# Patient Record
Sex: Female | Born: 1975
Health system: Southern US, Academic
[De-identification: ages and names within clinical notes are randomized; demographics above are authoritative.]

## PROBLEM LIST (undated history)

## (undated) ENCOUNTER — Emergency Department (HOSPITAL_COMMUNITY): Payer: Self-pay | Source: Home / Self Care

## (undated) ENCOUNTER — Telehealth

## (undated) ENCOUNTER — Encounter

## (undated) ENCOUNTER — Telehealth: Attending: Surgical Critical Care

## (undated) ENCOUNTER — Telehealth: Attending: Student in an Organized Health Care Education/Training Program

## (undated) ENCOUNTER — Encounter: Attending: Surgical Critical Care

## (undated) ENCOUNTER — Telehealth: Attending: "Nutrition

## (undated) ENCOUNTER — Encounter: Attending: "Nutrition

## (undated) ENCOUNTER — Telehealth: Attending: Orthopaedic Surgery

## (undated) ENCOUNTER — Inpatient Hospital Stay

## (undated) ENCOUNTER — Encounter: Attending: Anesthesiology

## (undated) ENCOUNTER — Encounter: Attending: Orthopaedic Surgery

## (undated) ENCOUNTER — Inpatient Hospital Stay: Payer: MEDICARE

## (undated) ENCOUNTER — Inpatient Hospital Stay: Payer: Medicare Other

## (undated) DIAGNOSIS — K5730 Diverticulosis of large intestine without perforation or abscess without bleeding: Secondary | ICD-10-CM

## (undated) DIAGNOSIS — Z9889 Other specified postprocedural states: Secondary | ICD-10-CM

## (undated) DIAGNOSIS — E119 Type 2 diabetes mellitus without complications: Secondary | ICD-10-CM

## (undated) DIAGNOSIS — E7800 Pure hypercholesterolemia, unspecified: Secondary | ICD-10-CM

## (undated) DIAGNOSIS — E039 Hypothyroidism, unspecified: Secondary | ICD-10-CM

## (undated) DIAGNOSIS — E109 Type 1 diabetes mellitus without complications: Secondary | ICD-10-CM

## (undated) DIAGNOSIS — F419 Anxiety disorder, unspecified: Secondary | ICD-10-CM

## (undated) DIAGNOSIS — Z931 Gastrostomy status: Secondary | ICD-10-CM

## (undated) DIAGNOSIS — E063 Autoimmune thyroiditis: Secondary | ICD-10-CM

## (undated) DIAGNOSIS — K219 Gastro-esophageal reflux disease without esophagitis: Secondary | ICD-10-CM

## (undated) DIAGNOSIS — R112 Nausea with vomiting, unspecified: Secondary | ICD-10-CM

## (undated) DIAGNOSIS — M1990 Unspecified osteoarthritis, unspecified site: Secondary | ICD-10-CM

## (undated) DIAGNOSIS — F909 Attention-deficit hyperactivity disorder, unspecified type: Secondary | ICD-10-CM

## (undated) HISTORY — PX: FEMORAL-POPLITEAL BYPASS GRAFT: SHX937

## (undated) HISTORY — PX: ABDOMINAL HYSTERECTOMY: SHX81

## (undated) HISTORY — PX: CHOLECYSTECTOMY: SHX55

## (undated) HISTORY — DX: Autoimmune thyroiditis: E06.3

## (undated) HISTORY — PX: KNEE SURGERY: SHX244

## (undated) HISTORY — PX: OTHER SURGICAL HISTORY: SHX169

## (undated) HISTORY — DX: Gastrostomy status: Z93.1

## (undated) HISTORY — DX: Diverticulosis of large intestine without perforation or abscess without bleeding: K57.30

## (undated) HISTORY — DX: Type 1 diabetes mellitus without complications: E10.9

## (undated) HISTORY — DX: Type 2 diabetes mellitus without complications: E11.9

## (undated) HISTORY — DX: Attention-deficit hyperactivity disorder, unspecified type: F90.9

---

## 2003-05-09 DIAGNOSIS — Z720 Tobacco use: Secondary | ICD-10-CM | POA: Insufficient documentation

## 2004-04-07 DIAGNOSIS — F329 Major depressive disorder, single episode, unspecified: Secondary | ICD-10-CM | POA: Insufficient documentation

## 2004-04-07 DIAGNOSIS — F32A Depression, unspecified: Secondary | ICD-10-CM | POA: Insufficient documentation

## 2011-07-02 DIAGNOSIS — R079 Chest pain, unspecified: Secondary | ICD-10-CM

## 2011-07-28 DIAGNOSIS — E785 Hyperlipidemia, unspecified: Secondary | ICD-10-CM | POA: Insufficient documentation

## 2012-10-16 DIAGNOSIS — Z789 Other specified health status: Secondary | ICD-10-CM | POA: Insufficient documentation

## 2012-10-16 DIAGNOSIS — E559 Vitamin D deficiency, unspecified: Secondary | ICD-10-CM | POA: Insufficient documentation

## 2012-11-01 DIAGNOSIS — E109 Type 1 diabetes mellitus without complications: Secondary | ICD-10-CM | POA: Insufficient documentation

## 2013-11-08 ENCOUNTER — Ambulatory Visit: Payer: Self-pay | Admitting: Internal Medicine

## 2013-11-15 ENCOUNTER — Ambulatory Visit (INDEPENDENT_AMBULATORY_CARE_PROVIDER_SITE_OTHER): Payer: BC Managed Care – PPO | Admitting: Internal Medicine

## 2013-11-15 ENCOUNTER — Encounter: Payer: Self-pay | Admitting: Internal Medicine

## 2013-11-15 VITALS — BP 114/68 | HR 95 | Temp 98.5°F | Resp 12 | Ht 66.0 in | Wt 155.8 lb

## 2013-11-15 DIAGNOSIS — Z90410 Acquired total absence of pancreas: Secondary | ICD-10-CM

## 2013-11-15 DIAGNOSIS — E063 Autoimmune thyroiditis: Secondary | ICD-10-CM

## 2013-11-15 DIAGNOSIS — E139 Other specified diabetes mellitus without complications: Secondary | ICD-10-CM

## 2013-11-15 DIAGNOSIS — E891 Postprocedural hypoinsulinemia: Secondary | ICD-10-CM

## 2013-11-15 NOTE — Progress Notes (Signed)
Patient ID: Jessica [name], female   DOB: 12/18/1975, 38 y.o.   MRN: [id number]   HPI  Jessica [name] is a 38 y.o.-year-old female, referred by her PCP, Dr.Favero ([place] Legrand Pitts), for management of euthyroid Hashimoto's thyroiditis and also upcoming pancreatectomy-related diabetes mellitus.   Pt. has been dx with Hashimoto's thyroiditis in 08/2013) when she presented with hypothyroid sxs.   I reviewed pt's thyroid tests per records received from PCP (08/2013): TSH 1.780 Free T4 1.2 (0.82-1.77)  Total T3 130 (71-180) TPO 44 (<34)  Pt denies feeling nodules in neck, hoarseness, dysphagia/odynophagia, SOB with lying down.  Pt describes: - no cold intolerance; +++ hot flushes - lost 60 lbs since 10 mo ago 2/2 chronic pancreatitis  - + fatigue - + constipation (long before starting Oxycodone) - + dry skin - + hair falling (worse in last 6 mo) - no depression  She has + FH of thyroid disorders in: hyperthyroidism and also hypothyroidism in sister, mother and aunt. No FH of thyroid cancer.  No h/o radiation tx to head or neck. No recent use of iodine supplements.  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 >> believed to be 2/2 genetic causes. She would like to follow with me for her post-pancreatectomy DM.  ROS: Constitutional: see HPI Eyes: no blurry vision, no xerophthalmia ENT: no sore throat, no nodules palpated in throat, occasional dysphagia/no odynophagia, no hoarseness Cardiovascular: no CP/SOB/palpitations/leg swelling Respiratory: no cough/SOB Gastrointestinal: + all: N/V/D/C Musculoskeletal: + both: muscle/joint aches Skin: no rashes, + hair loss Neurological: no tremors/numbness/tingling/dizziness, + HA Psychiatric: no depression/anxiety  PMH: - chronic pancreatitis - Hypertriglyceridemia - Hashimoto's thyroiditis  PSxH: - Knee Sx  - cholecystectomy 2000 -  Hysterectomy 2014  No past surgical history on file. History   Social History  . Marital Status: Married    Spouse Name: N/A    Number of Children: 0   Occupational History  . Pharm tech   Social History Main Topics  . Smoking status: Former Games developer  . Smokeless tobacco: Not on file  . Alcohol Use: 1 PPD  . Drug Use: No   Current Outpatient Rx  Name  Route  Sig  Dispense  Refill  . estradiol (VIVELLE-DOT) 0.1 MG/24HR patch   Transdermal   Place 1 patch onto the skin 2 (two) times a week.         . fenofibrate (TRICOR) 145 MG tablet   Oral   Take 145 mg by mouth at bedtime.         . Multiple Vitamins-Minerals (HM MULTIVITAMIN ADULT GUMMY PO)   Oral   Take 2 each by mouth daily.         . Oxycodone HCl 10 MG TABS   Oral   Take 10 mg by mouth 3 (three) times daily.          Allergies: PCN EES Sulfa Vancomycin Doxycycline Levaquin Statins Zetia Morphine, shellfish  FH: - see HPi  PE: BP 114/68  Pulse 95  Temp(Src) 98.5 F (36.9 C) (Oral)  Resp 12  Ht 5\' 6"  (1.676 m)  Wt 155 lb 12.8 oz (70.67 kg)  BMI 25.16 kg/m2  SpO2 97% Wt Readings from Last 3 Encounters:  11/15/13 155 lb 12.8 oz (70.67 kg)   Constitutional: normal weight, in NAD, tanned Eyes: PERRLA, EOMI, no exophthalmos ENT: moist mucous membranes, no thyromegaly, no cervical lymphadenopathy Cardiovascular: RRR,  No MRG Respiratory: CTA B Gastrointestinal: abdomen soft, NT, ND, BS+ Musculoskeletal: no deformities, strength intact in all 4 Skin: moist, warm, no rashes Neurological: no tremor with outstretched hands, DTR normal in all 4  ASSESSMENT: 1. Hashimoto's thyroiditis - euthyroid  2. Post-pancreatectomy DM  PLAN:  1. Patient with newly dx'ed euthyroid Hashimoto's thyroiditis. She appears euthyroid, but has hair loss and fatigue. - She does not appear to have a goiter, thyroid nodules, or neck compression symptoms - we discussed about her dx, and what it implies. I  explained the risk of hypothyroidism and the fact that she will need to have TFTs checked at least every year, but for now, there is no available tx if her TFTs are normal - pt understands and agrees with the plan  2. Post-pancreatectomy DM - she will have [facility].  - I hope that she will be insulin independent after this, but we did discuss about starting basal-bolus regimen and tapering down as the islets start functioning - I gave her a log and explained when to check sugars - we demonstrated finger sticks - given a Bayer contour meter and strips - I will see her back 3-4 weeks after her Sx but advised her to stay in touch and let me know about her insulin regimen and sugars before that - we will ask for surgery and postsurgery hospitalization records [facility]

## 2013-11-15 NOTE — Patient Instructions (Signed)
Please return for a visit ~3 weeks to 1 mo after your surgery.  Please let me know if you develop the following symptoms:  Hypothyroidism The thyroid is a large gland located in the lower front of your neck. The thyroid gland helps control metabolism. Metabolism is how your body handles food. It controls metabolism with the hormone thyroxine. When this gland is underactive (hypothyroid), it produces too little hormone.  CAUSES These include:   Absence or destruction of thyroid tissue.  Goiter due to iodine deficiency.  Goiter due to medications.  Congenital defects (since birth).  Problems with the pituitary. This causes a lack of TSH (thyroid stimulating hormone). This hormone tells the thyroid to turn out more hormone. SYMPTOMS  Lethargy (feeling as though you have no energy)  Cold intolerance  Weight gain (in spite of normal food intake)  Dry skin  Coarse hair  Menstrual irregularity (if severe, may lead to infertility)  Slowing of thought processes Cardiac problems are also caused by insufficient amounts of thyroid hormone. Hypothyroidism in the newborn is cretinism, and is an extreme form. It is important that this form be treated adequately and immediately or it will lead rapidly to retarded physical and mental development. DIAGNOSIS  To prove hypothyroidism, your caregiver may do blood tests and ultrasound tests. Sometimes the signs are hidden. It may be necessary for your caregiver to watch this illness with blood tests either before or after diagnosis and treatment. TREATMENT  Low levels of thyroid hormone are increased by using synthetic thyroid hormone. This is a safe, effective treatment. It usually takes about four weeks to gain the full effects of the medication. After you have the full effect of the medication, it will generally take another four weeks for problems to leave. Your caregiver may start you on low doses. If you have had heart problems the dose may be  gradually increased. It is generally not an emergency to get rapidly to normal. HOME CARE INSTRUCTIONS   Take your medications as your caregiver suggests. Let your caregiver know of any medications you are taking or start taking. Your caregiver will help you with dosage schedules.  As your condition improves, your dosage needs may increase. It will be necessary to have continuing blood tests as suggested by your caregiver.  Report all suspected medication side effects to your caregiver. SEEK MEDICAL CARE IF: Seek medical care if you develop:  Sweating.  Tremulousness (tremors).  Anxiety.  Rapid weight loss.  Heat intolerance.  Emotional swings.  Diarrhea.  Weakness. SEEK IMMEDIATE MEDICAL CARE IF:  You develop chest pain, an irregular heart beat (palpitations), or a rapid heart beat. MAKE SURE YOU:   Understand these instructions.  Will watch your condition.  Will get help right away if you are not doing well or get worse. Document Released: 06/07/2005 Document Revised: 08/30/2011 Document Reviewed: 01/26/2008 [facility] Patient Information 2014 [place], [place].

## 2013-11-16 ENCOUNTER — Encounter: Payer: Self-pay | Admitting: Internal Medicine

## 2013-11-19 ENCOUNTER — Other Ambulatory Visit: Payer: Self-pay | Admitting: *Deleted

## 2013-11-19 DIAGNOSIS — E119 Type 2 diabetes mellitus without complications: Secondary | ICD-10-CM

## 2013-11-19 HISTORY — PX: OTHER SURGICAL HISTORY: SHX169

## 2013-11-19 HISTORY — PX: PANCREATECTOMY: SHX1019

## 2013-11-19 HISTORY — DX: Type 2 diabetes mellitus without complications: E11.9

## 2013-11-19 MED ORDER — GLUCOSE BLOOD VI STRP: ORAL_STRIP | Status: DC

## 2013-11-19 MED ORDER — BAYER MICROLET LANCETS MISC: Status: DC

## 2013-12-06 ENCOUNTER — Telehealth: Payer: Self-pay | Admitting: Internal Medicine

## 2013-12-06 ENCOUNTER — Encounter: Payer: Self-pay | Admitting: Internal Medicine

## 2013-12-06 NOTE — Progress Notes (Signed)
Called by patient. [facility]. 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 The transplant went well, however, the islet solution was infected, and she needed to be started on antibiotics.  In the hospital, she was given 6 units of Lantus twice a day and Humalog sliding scale. Last Lantus dose was yesterday morning. Last night, her CBG was 112, so she skipped the Lantus dose. This a.m., her CBG was 120, so she called to see if she needs to take Lantus or Humalog. I advised her not to take any of them but just check her sugars before meals and at bedtime and will intervene if sugars are higher than 150. I advised her to let me know if this happens.

## 2013-12-06 NOTE — Telephone Encounter (Signed)
Called pt back >> phone discussion filed under Documentation in her chart.

## 2013-12-06 NOTE — Telephone Encounter (Signed)
Pt has some questions regarding insulin dosage. She needs some further instruction on dosing, please call back asap thank you

## 2013-12-06 NOTE — Telephone Encounter (Signed)
Dr Elvera LennoxGherghe to speak with pt.

## 2013-12-27 ENCOUNTER — Ambulatory Visit (INDEPENDENT_AMBULATORY_CARE_PROVIDER_SITE_OTHER): Payer: BC Managed Care – PPO | Admitting: Internal Medicine

## 2013-12-27 ENCOUNTER — Encounter: Payer: Self-pay | Admitting: Internal Medicine

## 2013-12-27 VITALS — BP 117/73 | HR 88 | Temp 98.2°F | Resp 12 | Wt 139.8 lb

## 2013-12-27 DIAGNOSIS — Z90410 Acquired total absence of pancreas: Secondary | ICD-10-CM

## 2013-12-27 DIAGNOSIS — E063 Autoimmune thyroiditis: Secondary | ICD-10-CM

## 2013-12-27 DIAGNOSIS — E891 Postprocedural hypoinsulinemia: Secondary | ICD-10-CM

## 2013-12-27 DIAGNOSIS — E139 Other specified diabetes mellitus without complications: Secondary | ICD-10-CM

## 2013-12-27 MED ORDER — INSULIN ASPART 100 UNIT/ML FLEXPEN: PEN_INJECTOR | SUBCUTANEOUS | Status: DC

## 2013-12-27 NOTE — Progress Notes (Signed)
Patient ID: Jessica [name], female   DOB: 03/19/1976, 38 y.o.   MRN: [id number]   HPI  Jessica FudgeRebecca L Steidle is a 38 y.o.-year-old female, initially referred by her PCP, Dr.Favero ([place] Legrand PittsWilliam Shough), for management of euthyroid Hashimoto's thyroiditis and also upcoming pancreatectomy-related diabetes mellitus. Last visit 1.5 mo ago.  Pt. has been dx with Hashimoto's thyroiditis in 08/2013 when she presented with hypothyroid sxs.   I reviewed pt's thyroid tests: - per records received from PCP (08/2013): TSH 1.780 Free T4 1.2 (0.82-1.77)  Total T3 130 (71-180) TPO 44 (<34)  Pt denies feeling nodules in neck, hoarseness, dysphagia/odynophagia, SOB with lying down.  Pt describes: - no cold intolerance; + hot flushes - lost 60 lbs since 10 mo ago 2/2 chronic pancreatitis, now lost 15 more lbs - + fatigue - + constipation - + dry skin - + hair falling - no depression  She is not on thyroid hh tx.  Post-pancreatectomy DM. She has a history of chronic pancreatitis >> had TP-IAT (total pancreatectomy with islet transplant) on 11/30/2013 - Dr Re[facility]Brayman [facility] >> believed to be 2/2 genetic causes.  - reviewed phone note since last visit:  12/06/2013: Called by patient. [facility] was [facility]. 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 The transplant went well, however, the islet solution was infected, and she needed to be started on antibiotics.  In the hospital, she was given 6 units of Lantus twice a day and Humalog sliding scale. Last Lantus dose was yesterday morning. Last night, her CBG was 112, so she skipped the Lantus dose. This a.m., her CBG was 120, so she called to see if she needs to take Lantus or Humalog. I advised her not to take any of them but just check her sugars before meals and at bedtime and will intervene if sugars are higher than 150. I advised her to let me know if this happens.  - she is now on: Lantus: 6 units 2x a day HumaLog: target 150,  ISF 50  Checks sugars 4-5x a day: - am: 113-314 - lunch: 128-339 - after lunch: 183 - dinner: 114-283 - after dinner: 281 - bedtime: 154-313  No lows.   ROS: Constitutional: see HPI Eyes: no blurry vision, no xerophthalmia ENT: no sore throat, no nodules palpated in throat, occasional dysphagia/no odynophagia, no hoarseness Cardiovascular: no CP/SOB/palpitations/leg swelling Respiratory: no cough/SOB Gastrointestinal: no N/V/D/+ C Musculoskeletal: + both: muscle/joint aches Skin: no rashes, + hair loss Neurological: no tremors/numbness/tingling/dizziness, + HA  I reviewed pt's medications, allergies, PMH, social hx, family hx and no changes required, except as mentioned above.  PE: BP 117/73  Pulse 88  Temp(Src) 98.2 F (36.8 C) (Oral)  Resp 12  Wt 139 lb 12.8 oz (63.413 kg)  SpO2 97% Wt Readings from Last 3 Encounters:  12/27/13 139 lb 12.8 oz (63.413 kg)  11/15/13 155 lb 12.8 oz (70.67 kg)   Constitutional: normal weight, in NAD, tanned Eyes: PERRLA, EOMI, no exophthalmos ENT: moist mucous membranes, no thyromegaly, no cervical lymphadenopathy Cardiovascular: RRR, No MRG Respiratory: CTA B Gastrointestinal: abdomen soft, NT, ND, BS+ Musculoskeletal: no deformities, strength intact in all 4 Skin: moist, warm, no rashes Neurological: no tremor with outstretched hands, DTR normal in all 4  ASSESSMENT: 1. Hashimoto's thyroiditis - euthyroid  2. Post-pancreatectomy DM  PLAN:  1. Patient with newly dx'ed euthyroid Hashimoto's thyroiditis. She appears euthyroid, but has hair loss and fatigue. - no goiter, thyroid nodules, or neck  compression symptoms - her TFTs are normal >> continue to follow  2. Post-pancreatectomy DM - she had total pancreatectomy with hepatic islet transplant at [facility] 1 mo ago  - sugars fluctuating as she is skipping Lantus if sugars are <150 and she only uses a SSI - I advised her to: Patient Instructions  Please take 10 units of Lantus at  bedtime. Take NovoLog as follows:  - 3 units for a smaller meal  - 4 units for a larger meal Please inject the Humalog 15 min before you eat. Please let me know if your sugars start to decrease, in this case, we will need to decrease the doses.  Please return in 1 month with your sugar log.  - refilled NovoLog pens - I will see her in 1 mo

## 2013-12-27 NOTE — Patient Instructions (Addendum)
Please take 10 units of Lantus at bedtime. Take NovoLog as follows:  - 3 units for a smaller meal  - 4 units for a larger meal Please inject the Humalog 15 min before you eat.  Please let me know if your sugars start to decrease, in this case, we will need to decrease the doses.   Please return in 1 month with your sugar log.

## 2014-01-18 ENCOUNTER — Telehealth: Payer: Self-pay | Admitting: Internal Medicine

## 2014-01-18 NOTE — Telephone Encounter (Signed)
Please read note below and advise.  

## 2014-01-18 NOTE — Telephone Encounter (Signed)
Pt returned call. Pt said that she knows she is supposed to take 10 units of Lantus at night. She has been. Her bg was 102 last night. She did not take the Lantus. This AM it was 104. She wants to know when she should take the Lantus? Please advise.

## 2014-01-18 NOTE — Telephone Encounter (Signed)
Carollee HerterShannon, let's see how her sugars are - I advised her to take 10 units of Lantus in HS, but let's see if she needs it.

## 2014-01-18 NOTE — Telephone Encounter (Signed)
Jessica Bibleat stated that her transplant seems to be working. She is concerned about her Lantus, please advise her how to take it.

## 2014-01-18 NOTE — Telephone Encounter (Signed)
Called pt and lvm advising her per Dr Charlean SanfilippoGherghe's note. Asked pt to return my call.

## 2014-01-21 NOTE — Telephone Encounter (Signed)
Called pt and advised her per Dr Charlean SanfilippoGherghe's note. Pt stated that her bg's have been good. This AM it was 104. I advised pt if her bg's were below 89 then not to take the Lantus. Pt thought if her bg was below 120, that they were low, (not sure why she thought this). Advised pt to continue taking her Lantus and if she consistently had lows below 89 then to let us know. Pt understood. Be advised.

## 2014-01-21 NOTE — Telephone Encounter (Signed)
Jessica [name], let's have her stop Lantus altogether - she appears not to need it. But check sugars frequently and let me know if they increase.

## 2014-01-21 NOTE — Telephone Encounter (Signed)
Called pt and advised her per Dr Charlean Sanfilippo note. Pt understood. Advised pt to let us know if she has consistent increases. Pt understood.

## 2014-01-21 NOTE — Telephone Encounter (Signed)
Please hold Lantus but check sugars frequently throughout the day to make sure they are not increasing.

## 2014-01-29 ENCOUNTER — Encounter: Payer: Self-pay | Admitting: Internal Medicine

## 2014-01-29 ENCOUNTER — Ambulatory Visit (INDEPENDENT_AMBULATORY_CARE_PROVIDER_SITE_OTHER): Payer: BC Managed Care – PPO | Admitting: Internal Medicine

## 2014-01-29 VITALS — BP 108/64 | HR 73 | Temp 98.2°F | Resp 12 | Wt 136.0 lb

## 2014-01-29 DIAGNOSIS — E139 Other specified diabetes mellitus without complications: Secondary | ICD-10-CM

## 2014-01-29 DIAGNOSIS — Z90410 Acquired total absence of pancreas: Secondary | ICD-10-CM

## 2014-01-29 DIAGNOSIS — E891 Postprocedural hypoinsulinemia: Secondary | ICD-10-CM

## 2014-01-29 DIAGNOSIS — E063 Autoimmune thyroiditis: Secondary | ICD-10-CM

## 2014-01-29 LAB — T4, FREE: Free T4: 1 ng/dL (ref 0.60–1.60)

## 2014-01-29 LAB — TSH: TSH: 0.38 u[IU]/mL (ref 0.35–4.50)

## 2014-01-29 LAB — T3, FREE: T3, Free: 2.6 pg/mL (ref 2.3–4.2)

## 2014-01-29 MED ORDER — INSULIN GLARGINE 100 UNIT/ML SOLOSTAR PEN: 10.0000 [IU] | PEN_INJECTOR | Freq: Every day | SUBCUTANEOUS | Status: DC

## 2014-01-29 MED ORDER — GLUCOSE BLOOD VI STRP: ORAL_STRIP | Status: DC

## 2014-01-29 NOTE — Patient Instructions (Addendum)
Patient Instructions  Please take 10 units of Lantus at bedtime. If sugars <110 at night, try to wake up once in the first night to see if suars at 2-3 am are low. Take NovoLog as follows:  - target: 150 >> 130 - ICR: 15 - ISF: 50 >> 30 Please return in 1.5 months with your sugar log.  Please stop at the lab.

## 2014-01-29 NOTE — Progress Notes (Signed)
Patient ID: Jessica [name], female   DOB: [birth year], 38 y.o.   MRN: [id number]   HPI  Jessica [name] is a 38 y.o.-year-old female, initially referred by her PCP, Dr.Favero ([place] Legrand Pitts), for management of euthyroid Hashimoto's thyroiditis and also upcoming pancreatectomy-related diabetes mellitus. Last visit 1.5 mo ago.  Hashimoto's thyroiditis  - dx in 08/2013 when she presented with hypothyroid sxs.  She is not on thyroid hh tx. Last TFTs: - per records received from PCP (08/2013): TSH 1.780 Free T4 1.2 (0.82-1.77)  Total T3 130 (71-180) TPO 44 (<34)  Pt denies feeling nodules in neck, hoarseness, dysphagia/odynophagia, SOB with lying down.  Pt describes: - +cold intolerance - + weight loss - + fatigue - no constipation - + dry skin - + hair falling - no depression  Post-pancreatectomy DM. She has a history of chronic pancreatitis >> had TP-IAT (total pancreatectomy with islet transplant) on 11/30/2013 - Dr Rebekah Chesterfield at [facility] >> believed to be 2/2 genetic causes.   Last Hba1c (from [facility]) 7.6%  She is now on: Lantus:  10 units hs, if sugars >110 6 units hs, if sugars <110 HumaLog: target 150, ISF 50, ICR 15  (3-6 units per meal)  She had a period with low CBGs >> we tried to stop Lantus but sugars increased >> she is now using the following doses:  Checks sugars 4-5x a day  -in last 3 weeks improved): - am: 113-314 >> 75-170 (196 x1),  - lunch: 128-339 >> 70 x1, 89-213 - after lunch: 183 >> 208  - dinner: 114-283 >> 68 x1, 86-192 (200x3) - after dinner: 281 >> n/c - bedtime: 154-313 >> 113-187 (200s)   ROS: Constitutional: see HPI Eyes: no blurry vision, no xerophthalmia ENT: no sore throat, no nodules palpated in throat, occasional dysphagia/no odynophagia, no hoarseness Cardiovascular: no CP/SOB/palpitations/leg swelling Respiratory: no cough/SOB Gastrointestinal: no N/V/D/+ C Musculoskeletal: no muscle/joint aches Skin: no rashes, + hair  loss Neurological: no tremors/numbness/tingling/dizziness, + HA + low libido  I reviewed pt's medications, allergies, PMH, social hx, family hx and no changes required, except as mentioned above.  PE: BP 108/64  Pulse 73  Temp(Src) 98.2 F (36.8 C) (Oral)  Resp 12  Wt 136 lb (61.689 kg)  SpO2 95% Wt Readings from Last 3 Encounters:  01/29/14 136 lb (61.689 kg)  12/27/13 139 lb 12.8 oz (63.413 kg)  11/15/13 155 lb 12.8 oz (70.67 kg)   Constitutional: normal weight, in NAD Eyes: PERRLA, EOMI, no exophthalmos ENT: moist mucous membranes, no thyromegaly, no cervical lymphadenopathy Cardiovascular: RRR, No MRG Respiratory: CTA B Gastrointestinal: abdomen soft, NT, ND, BS+ Musculoskeletal: no deformities, strength intact in all 4 Skin: moist, warm, no rashes Neurological: no tremor with outstretched hands, DTR normal in all 4  ASSESSMENT: 1. Hashimoto's thyroiditis - euthyroid  2. Post-pancreatectomy DM  PLAN:  1. Patient with newly dx'ed euthyroid Hashimoto's thyroiditis. She appears euthyroid, but still has hair loss and fatigue. - no goiter, thyroid nodules, or neck compression symptoms - will check TFTs today  Office Visit on 01/29/2014  Component Date Value Ref Range Status  . TSH 01/29/2014 0.38  0.35 - 4.50 uIU/mL Final  . Free T4 01/29/2014 1.00  0.60 - 1.60 ng/dL Final  . T3, Free 40/98/1191 2.6  2.3 - 4.2 pg/mL Final  All labs normal. Continue off LT4.  2. Post-pancreatectomy DM - she had total pancreatectomy with hepatic islet transplant at [facility] this Spring - sugars fluctuating, but they are better! -  I advised her to: Patient Instructions  Please take 10 units of Lantus at bedtime. If sugars <110 at night, try to wake up once in the first night to see if suars at 2-3 am are low. Take NovoLog as follows:  - target: 150 >> 130 - ICR: 15 - ISF: 50 >> 30 Please return in 1 month with your sugar log.  Please stop at the lab. - refilled strips - I will see  her in 1.5 mo

## 2014-02-04 ENCOUNTER — Telehealth: Payer: Self-pay | Admitting: Internal Medicine

## 2014-02-04 NOTE — Telephone Encounter (Signed)
Noted, patient is aware. 

## 2014-02-04 NOTE — Telephone Encounter (Signed)
Patient states that she is having blood sugar issues  Her levels are going over 200   Please advise patient   Thank you

## 2014-02-04 NOTE — Telephone Encounter (Signed)
Dr. Rexene AlbertsGherges Patient,  Saturday morning her morning sugar was 92, she had eaten cereal and checked her sugar at lunch time, it went up to 558, she took 8 units of insulin.  2 hours later sugar was 368, she took 6 more units of insulin.  At supper it was 240 and she took 6 units of insulin, 2 hours later it was a little over 200 and she took 4 more units, at bedtime it was 170.  Sunday upon waking it was 169 then increased and stayed over 200 all day. Monday morning upon waking it was 122  Patient wanted to make sure you know she had her Islets transferred from her pancreas to her liver.

## 2014-02-04 NOTE — Telephone Encounter (Signed)
Increase Lantus insulin to 12 units. Also she needs to take 4 more units of NovoLog with every meal than what she is taking currently regardless of blood sugar

## 2014-02-16 ENCOUNTER — Other Ambulatory Visit: Payer: Self-pay | Admitting: Internal Medicine

## 2014-02-20 ENCOUNTER — Telehealth: Payer: Self-pay | Admitting: Internal Medicine

## 2014-02-20 NOTE — Telephone Encounter (Signed)
Please read note below. Please advise in Dr Charlean Sanfilippo absence (she's off for the afternoon).

## 2014-02-20 NOTE — Telephone Encounter (Signed)
Noted, patient is aware. 

## 2014-02-20 NOTE — Telephone Encounter (Signed)
Has she stopped inulin?

## 2014-02-20 NOTE — Telephone Encounter (Signed)
Patient cut back to 3 units after eating a hamburger and fries from [employer] and it went from 88 to 61.  After 2 glasses of Orange Juice and 2 handfuls of jelly beans it went up to 129, now it's 98.  She's afraid to stop taking her insulin and wants to know what to do.  Please advise

## 2014-02-20 NOTE — Telephone Encounter (Signed)
Pt could not get blood sugar below 200 yesterday but now can't get it above 92. She just had a eyelet transplant could that cause this. She has been eating junk and cannot get her sugars up. Please advise

## 2014-02-20 NOTE — Telephone Encounter (Signed)
She needs to stop her insulin as her transplanted cells are now making insulin to cover her needs

## 2014-02-24 ENCOUNTER — Other Ambulatory Visit: Payer: Self-pay | Admitting: Internal Medicine

## 2014-03-01 ENCOUNTER — Encounter: Payer: Self-pay | Admitting: Internal Medicine

## 2014-03-06 ENCOUNTER — Encounter: Payer: Self-pay | Admitting: Internal Medicine

## 2014-03-06 ENCOUNTER — Encounter: Payer: BC Managed Care – PPO | Attending: Internal Medicine | Admitting: Nutrition

## 2014-03-06 ENCOUNTER — Telehealth: Payer: Self-pay | Admitting: Internal Medicine

## 2014-03-06 ENCOUNTER — Ambulatory Visit (INDEPENDENT_AMBULATORY_CARE_PROVIDER_SITE_OTHER): Payer: BC Managed Care – PPO | Admitting: Internal Medicine

## 2014-03-06 VITALS — BP 102/60 | HR 79 | Temp 98.2°F | Resp 12 | Wt 132.0 lb

## 2014-03-06 DIAGNOSIS — E891 Postprocedural hypoinsulinemia: Secondary | ICD-10-CM | POA: Insufficient documentation

## 2014-03-06 DIAGNOSIS — E139 Other specified diabetes mellitus without complications: Secondary | ICD-10-CM | POA: Insufficient documentation

## 2014-03-06 DIAGNOSIS — Z90410 Acquired total absence of pancreas: Secondary | ICD-10-CM | POA: Insufficient documentation

## 2014-03-06 DIAGNOSIS — Z713 Dietary counseling and surveillance: Secondary | ICD-10-CM | POA: Diagnosis not present

## 2014-03-06 DIAGNOSIS — Z794 Long term (current) use of insulin: Secondary | ICD-10-CM | POA: Insufficient documentation

## 2014-03-06 MED ORDER — INSULIN GLARGINE 100 UNIT/ML SOLOSTAR PEN: 7.0000 [IU] | PEN_INJECTOR | Freq: Two times a day (BID) | SUBCUTANEOUS | Status: DC

## 2014-03-06 MED ORDER — INSULIN ASPART 100 UNIT/ML FLEXPEN: PEN_INJECTOR | SUBCUTANEOUS | Status: DC

## 2014-03-06 NOTE — Progress Notes (Signed)
Jessica [name] was shown the sensor, transmitter and receiver (pump) and we discussed the differences between blood sugars and interstitial sugar levels.  We also discussed the advantages and disadvantage of cgm therapy.  She reported good understanding of this.   She is adament about starting pump therapy, and we discussed the advantages and disadvantages of pump therapy, and she is aware that she must see the  Dr. at [facility], next month, before she can consider pump therapy.   She was shown the pump at her request, and the infusion sets, and reported that she had good understanding of what is needed and what is expected of her if both of her doctor ok  pump therapy.   She reports that she got a call from [employer], but has not had a chance to return the call.  I explained that the insulin pump and CGM is a prescription item, requiring the doctor to OK this before ordering.  She reported good understanding of this.    When she gets the CGM, she will call me to be trained on this. .  We reviewed her new insulin doses.  She had no questions and reported good understanding of the Lantus dose increase, as well as the I/C ratio change to 1u/12 grams, and the need to add 2 extra units to her supper meal.  They had no final questions.

## 2014-03-06 NOTE — Progress Notes (Signed)
Patient ID: Jessica [name], female   DOB: February 06, 1976, 38 y.o.   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 Last visit 1.5 mo ago.  Euthyroid Hashimoto's thyroiditis  - dx in 08/2013 when she presented with hypothyroid sxs.  She is not on thyroid hh tx.  Last TFTs: Office Visit on 01/29/2014  Component Date Value Ref Range Status  . TSH 01/29/2014 0.38  0.35 - 4.50 uIU/mL Final  . Free T4 01/29/2014 1.00  0.60 - 1.60 ng/dL Final  . T3, Free 36/64/4034 2.6  2.3 - 4.2 pg/mL Final  All labs normal >> we continued off LT4.  - Previously (08/2013): TSH 1.780 Free T4 1.2 (0.82-1.77)  Total T3 130 (71-180) TPO 44 (<34)  Pt denies feeling nodules in neck, hoarseness, dysphagia/odynophagia, SOB with lying down.  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   Last Hba1c (from [facility]) 7.6%  She is now on: Lantus 10 units at bedtime (if very high CBGs all day: 12 units) NovoLog as follows:  - target: 150 >> 130 - ICR: 15 - ISF: 50 >> 30 She ends up with ~8 units per meal. Has to use the SSI with every meal. She had a period with low CBGs >> we tried to stop Lantus but sugars increased.  She is very frustrated about her CBGs, which have worsened recently. No lows in last 2 weeks.  She is interested in a CGM and a 530G insulin pump.   Checks sugars 4-5x a day : - am: 113-314 >> 75-170 (196 x1) >> 160-325 - lunch: 128-339 >> 70 x1, 89-213 >> 144-376 - after lunch: 183 >> 208 >>  n/c - dinner: 114-283 >> 68 x1, 86-192 (200x3) >> 65, 170-200 - after dinner: 281 >> n/c >> 176-325 - bedtime: 154-313 >> 113-187 (200s) >> 121, 221  Meals: - breakfast: cereal (frosted flakes) + 2% milk - lunch: sandwich; cheese sticks or cops/crackers (more a snack); popcorn; salads - dinner: fast food; lasagna; ham + veggies - snacks  ROS: Constitutional: see HPI Eyes: no blurry vision, no xerophthalmia ENT: no sore throat, no nodules palpated in throat, occasional dysphagia/no odynophagia, no hoarseness Cardiovascular: no CP/SOB/palpitations/leg swelling Respiratory: no cough/SOB Gastrointestinal: no N/V/+ D/+ C Musculoskeletal: + muscle aches/no joint aches Skin: no rashes, + hair loss Neurological: no tremors/numbness/tingling/dizziness, no HA  + low libido  I reviewed pt's medications, allergies, PMH, social hx, family hx and no changes required, except as mentioned above - she increased Creon.  PE: BP 102/60  Pulse 79  Temp(Src) 98.2 F (36.8 C) (Oral)  Resp 12  Wt 132 lb (59.875 kg)  SpO2 95% Wt Readings from Last 3 Encounters:  03/06/14 132 lb (59.875 kg)  01/29/14 136 lb (61.689 kg)  12/27/13 139 lb 12.8 oz (63.413 kg)   Constitutional: normal weight, in NAD Eyes: PERRLA, EOMI, no exophthalmos ENT: moist mucous membranes, no thyromegaly, no cervical lymphadenopathy Cardiovascular: RRR, No MRG Respiratory: CTA B Gastrointestinal: abdomen soft, NT, ND, BS+ Musculoskeletal:  no deformities, strength intact in all 4 Skin: moist, warm, no rashes Neurological: no tremor with outstretched hands, DTR normal in all 4  ASSESSMENT: 1. Hashimoto's thyroiditis - euthyroid  2. Post-pancreatectomy DM  PLAN:  1. Patient with euthyroid Hashimoto's thyroiditis. She appears euthyroid, but still has hair loss and fatigue. - no goiter, thyroid nodules, or neck compression symptoms 0- reviewed together last TFTs, which were normal, 1 mo ago. - will check TFTs in 3-6  months  2. 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 She had periods with more hypoglycemia when we tried to reduce her insulin >> rebound of higher sugars when she had to return to previous insulin doses. In last 2 weeks >> sugars increasing in the 200-300 range. She would like to have a pump + CGM, but I think it's a little too soon after the transplant. I advised her to d/w Dr Jessica [name] at next visit in 1 month and if he considers the transplant unsuccessful, to go ahead with the pump. I did refer her to DM education (pre-pump training and she had this today). We will apply for the Enlite CGM as this would be helpful for her both now and after she gets the pump (530 G - threshold suspend).   - sugars less fluctuating, more uniformly high - we will increase and split the Lantus dose and increase her mealtime insulin. - I advised her to: Patient Instructions  Please increase the Lantus to 7 units in am and 7 units every night. Take NovoLog as follows:  - target: 130  - ICR: 15 >> 12  - ISF: 30  However, with dinner, take 2 more units than calculated. Please let me know if sugars are consistently <80 or >180. Please return in 1-1.5 month with your sugar log.  - I will see her in 1.5 mo, after her visit at [facility]. Will check HbA1c then if not checked at [facility].

## 2014-03-06 NOTE — Patient Instructions (Signed)
1.  Do the CGM training on line, at [email or web address]. 2.  Call me when the cgm come in to set up an appt. To start this.

## 2014-03-06 NOTE — Telephone Encounter (Signed)
Patient stated that Dr Joesph July The (Transplant surgeon) gave you the ok for the patient to have the pump and the CMG.  If you have any questions you can call  Leafy Kindle [phone] his assistant

## 2014-03-06 NOTE — Patient Instructions (Signed)
Please increase the Lantus to 7 units in am and 7 units every night. Take NovoLog as follows:  - target: 130  - ICR: 15 >> 12  - ISF: 30  However, with dinner, take 2 more units than calculated.  Please let me know if sugars are consistently <80 or >180.  Please return in 1-1.5 month with your sugar log.

## 2014-03-06 NOTE — Telephone Encounter (Signed)
Jessica [name] [facility] calling the MD there Dr. Rebekah Chesterfield has cleared her for the pump

## 2014-03-07 NOTE — Telephone Encounter (Signed)
Form along with 3 OV notes were faxed to [employer] this morning.

## 2014-03-07 NOTE — Telephone Encounter (Signed)
Pump and CGM form filled in and will fax to [employer].

## 2014-03-07 NOTE — Telephone Encounter (Signed)
Please read note below

## 2014-03-12 NOTE — Progress Notes (Unsigned)
Patient need refill of pin needles BD Nano.

## 2014-03-15 NOTE — Telephone Encounter (Signed)
Patient stated that she just got her pump in the mail, want to know what should she do next, blood sugars is running really in the 400dreds  Please advise

## 2014-03-18 ENCOUNTER — Ambulatory Visit: Payer: BC Managed Care – PPO | Admitting: Internal Medicine

## 2014-03-18 ENCOUNTER — Telehealth: Payer: Self-pay | Admitting: Internal Medicine

## 2014-03-18 NOTE — Telephone Encounter (Signed)
See below and please advise, Thanks!  

## 2014-03-18 NOTE — Telephone Encounter (Signed)
Pt has received her pump and she has a friend that could get her the training today over at [facility].  We just need to send the orders for insulin to [facility] with the basal and bolus rates she can receive the training up there today.  Please call the pt when this is complete

## 2014-03-18 NOTE — Telephone Encounter (Signed)
Patient want to add to the message she sent this morning, she stated that he her blood sugar was 530.

## 2014-03-19 ENCOUNTER — Telehealth: Payer: Self-pay | Admitting: Internal Medicine

## 2014-03-19 NOTE — Telephone Encounter (Signed)
Please read note below. I have not called pt yet. Please advise.

## 2014-03-19 NOTE — Telephone Encounter (Signed)
I would feel more comfortable with her coming here to have [facility]. Can we add her to Linda's schedule for Monday. Would she agree with this?

## 2014-03-19 NOTE — Telephone Encounter (Signed)
Called pt and lvm advising her per Dr Charlean SanfilippoGherghe's note. Also advised pt that Dr Elvera LennoxGherghe said she would feel more comfortable with her coming here to have [facility]. Advised pt to call back and give us more details about her this.

## 2014-03-19 NOTE — Telephone Encounter (Signed)
Jessica [name], can you find more details about this? When are her sugars 500's - what part of the day, is this happening consistently, any lows?

## 2014-03-19 NOTE — Telephone Encounter (Signed)
[facility] called today stating that [name] has insulin pump and has not been educated very well  After careful review I looked into patients chart and explained that Dr. Elvera LennoxGherghe would feel comfortable to see Jessica [name] to start pump  Patient has been using the ED for care as her sugars are 520  Please advise patient on what she can do    Thank you

## 2014-03-20 NOTE — Telephone Encounter (Signed)
Please read note below and advise.  

## 2014-03-20 NOTE — Telephone Encounter (Signed)
Please increase the insulin as follows: Increase the Lantus to 9 units in am and 9 units every night. Can increase by 2 units added to each dose if sugars not decreased in 3 days. NovoLog:  - target: 130 >> 120 - ICR: 12 >> 10 - ISF: 30  Is she taking the sliding scale? Can she come on Monday with her pump to see Rosette RevealLinda  - Shannon, can you please add her to her schedule?

## 2014-03-20 NOTE — Telephone Encounter (Signed)
Called pt and advised her per Dr Charlean SanfilippoGherghe's note. Pt understood. Pt to call back on Friday and let us know how her sugars are. Pt is scheduled to do a new pump start with Cristy FolksLinda Spagnola on Tues, Oct. 6th. Be advised.

## 2014-03-20 NOTE — Telephone Encounter (Signed)
Pt called back she is having to take 40 or more units daily to get her blood sugars down  03/19/14- AM 361, lunch 290, dinner 308, pm 292 03/18/14-AM 292, lunch 520, went to ER got her down to 120 before dinner, pm 138 03/17/14- AM 308, 411 at lunch, 280 at dinner, 238 at bedtime

## 2014-03-22 ENCOUNTER — Telehealth: Payer: Self-pay | Admitting: *Deleted

## 2014-03-22 NOTE — Telephone Encounter (Signed)
Pt called stating her sugars are still running high. Her bg was 70 at bedtime. 279 this AM (fasting). Pt had a dr's appt, did not eat until 11:00; bg was 325. Pt took 10 units of insulin, ate ham, eggs, toast and a diet drink; her bg was 282 3 hours after eating (she took 4 more units of insulin). Please advise what pt should do. Thank you.

## 2014-03-22 NOTE — Telephone Encounter (Signed)
I am not sure why the sugar was so high this am. She can increase the bedtime lantus dose to 11 units. The sugars appeared to be coming down after she bolused and she ate in am.

## 2014-03-23 ENCOUNTER — Other Ambulatory Visit: Payer: Self-pay | Admitting: Internal Medicine

## 2014-03-25 NOTE — Telephone Encounter (Signed)
Do not change the mealtime bolus settings, she can use the sliding scale to bring the sugars down today, but tonight, she can take 14 units of Lantus.

## 2014-03-25 NOTE — Telephone Encounter (Signed)
Called pt to advise her per Dr Charlean SanfilippoGherghe's note. Pt said she increased her bedtime lantus dose to 11 units and her fasting blood sugar this AM is 319. Pt says that her sugars are still high no matter what. Pt has appt to see Cristy FolksLinda Spagnola tomorrow. Please advise.

## 2014-03-25 NOTE — Telephone Encounter (Signed)
Called pt and advised her per Dr Gherghe's note. Pt understood.  

## 2014-03-26 ENCOUNTER — Encounter: Payer: BC Managed Care – PPO | Attending: Internal Medicine | Admitting: Nutrition

## 2014-03-26 DIAGNOSIS — E139 Other specified diabetes mellitus without complications: Secondary | ICD-10-CM

## 2014-03-26 DIAGNOSIS — Z90410 Acquired total absence of pancreas: Principal | ICD-10-CM

## 2014-03-26 DIAGNOSIS — E891 Postprocedural hypoinsulinemia: Principal | ICD-10-CM

## 2014-03-26 NOTE — Progress Notes (Signed)
This patient and her husband were instructed on the use of the Medtronic insulin pump and Enlite sensor.  She had set the date/time, and alarms on the pump before coming today and said that she had done the first 3 chapters in the work book.   She was instructed how to put in/edit and delete basal rates, and how to put the pump in stop and resume.  Basal rates were set by patient at 1u/hr.   with little assistance from me.  She set up the bolus wizard as instructed at:  I/C 12, sensitivity: 20, timing, 4 hours, target 120-120.  Since she took her Lantus last night at 8:30PM, we put her pump in a 100% basal reduction mode until 6:30 tonight.   She was shown how to give a bolus and re demonstrated the procedure X2 with little assistance from me.  She said that she was knowledgeable with carb counting, but was given a sheet with 15 gram carb servings of various foods, and reviewed what carbs were.  She reported good understanding of this and had no questions.    We reviewed low blood sugars--symptoms and treatments and she was given glucose tablets to use for this.  I will call her tonight to see how she did with giving her supper bolus.   At her request, she was instructed on how to insert the [facility] sensor and how to calibrate it.  She reported good understanding of this and had no questions.  Alarms were set: High alarm: 275, low alarm 70, Threshold suspend: 60.

## 2014-03-26 NOTE — Patient Instructions (Signed)
Test blood sugars before meals, 2hr. After meals, at bedtime.   Review manual--first 2 chapters tonight.

## 2014-03-27 ENCOUNTER — Telehealth: Payer: Self-pay | Admitting: Nutrition

## 2014-03-27 ENCOUNTER — Other Ambulatory Visit: Payer: Self-pay | Admitting: *Deleted

## 2014-03-27 MED ORDER — GLUCOSE BLOOD VI STRP: ORAL_STRIP | Status: DC

## 2014-03-27 MED ORDER — INSULIN ASPART 100 UNIT/ML ~~LOC~~ SOLN: 60.0000 [IU] | Freq: Every day | SUBCUTANEOUS | Status: DC

## 2014-03-27 NOTE — Telephone Encounter (Signed)
Phone Call to see how she was doing on her insulin pump. Pt. Reported no difficulty giving bolus before supper meal today.  She reported that her presupper blood sugar was 167.  It had only been 1 hour since her supper meal, and that her sensor reading was 171

## 2014-03-29 ENCOUNTER — Telehealth: Payer: Self-pay | Admitting: *Deleted

## 2014-03-29 NOTE — Telephone Encounter (Signed)
Returned Jessica [name]'s call and advised her per Dr Elvera LennoxGherghe ok to discard the insulin left in the reservoir. Jessica [name] understood.

## 2014-03-29 NOTE — Telephone Encounter (Signed)
Wants to know the insulin left in the reservoir if she suppose to discard that, her sugar has been awesome per patient, please call her when possible

## 2014-04-01 ENCOUNTER — Telehealth: Payer: Self-pay | Admitting: Nutrition

## 2014-04-01 NOTE — Telephone Encounter (Signed)
Pt. Reports that blood sugars are dropping low-40s and 50s around 2-4 AM, despite eating a regular candy bar at HS with no bolus. Plan:  Basal rated dropped from 1.0 to 0.8u/hr, from 10PM to 5AM, and 1.0 the rest of the day.  She reports no lows during the day.  She is setting up her carelink today with log in to be borekalynn and passwork is [email or web address].  I will download this tomorrow to see if any more changes need to be made.

## 2014-04-02 ENCOUNTER — Ambulatory Visit: Payer: BC Managed Care – PPO | Admitting: Nutrition

## 2014-04-02 NOTE — Telephone Encounter (Signed)
Phone Call from patient:  Blood sugar at HS: 189 (4 hr. PcS), at 3AM: blood sugar dropped to below 60, basal rate stopped for 2 hours, At 5AM, blood sugar was still below 60.  Pt. Treated the low, and the FBS this AM (8:30), was 79.  Pt. Reported that no lows during the day yesterday, and that all blood sugars were WNL. Plan:  Basal rated reduced from 10PM to 5AM from 0.8u/hr to 0.6. Pt. Will call me tomorrow with blood sugar readings

## 2014-04-10 ENCOUNTER — Telehealth: Payer: Self-pay | Admitting: Nutrition

## 2014-04-10 NOTE — Telephone Encounter (Signed)
Patient reports no difficulty changing cartridges, or infusion sets or sensors.  She reports that her blood sugars drop low, only if she sleeps in and does not eat right at 9AM.  She also says that she has only had one high--due to overeating and not putting in the correct carbs.  At that time the blood sugar was 306. Says that when she gives a meal time bolus,and she counts her carbs correctly, her blood sugars are usually good at the next meal.   She says that when she does a correction bolus, is always come down to between  110 and 120.    Current basal settings:  MN: 0.6,  5AM: 1.0,  10PM: 0.6                New Change:  MN: 0.6, 5AM: 0.8,  10AM: 1.0,  10PM: 0.6   She had no final questions.

## 2014-04-19 ENCOUNTER — Ambulatory Visit: Payer: BC Managed Care – PPO | Admitting: Internal Medicine

## 2014-04-22 ENCOUNTER — Encounter: Payer: Self-pay | Admitting: Internal Medicine

## 2014-04-22 ENCOUNTER — Encounter: Payer: BC Managed Care – PPO | Attending: Internal Medicine | Admitting: Nutrition

## 2014-04-22 ENCOUNTER — Ambulatory Visit (INDEPENDENT_AMBULATORY_CARE_PROVIDER_SITE_OTHER): Payer: BC Managed Care – PPO | Admitting: Internal Medicine

## 2014-04-22 VITALS — BP 108/68 | HR 75 | Temp 98.2°F | Resp 12 | Wt 296.0 lb

## 2014-04-22 DIAGNOSIS — Z90410 Acquired total absence of pancreas: Secondary | ICD-10-CM | POA: Insufficient documentation

## 2014-04-22 DIAGNOSIS — Z713 Dietary counseling and surveillance: Secondary | ICD-10-CM | POA: Insufficient documentation

## 2014-04-22 DIAGNOSIS — E891 Postprocedural hypoinsulinemia: Secondary | ICD-10-CM

## 2014-04-22 DIAGNOSIS — E139 Other specified diabetes mellitus without complications: Secondary | ICD-10-CM

## 2014-04-22 DIAGNOSIS — E063 Autoimmune thyroiditis: Secondary | ICD-10-CM

## 2014-04-22 NOTE — Progress Notes (Signed)
Patient ID: Jessica [name], female   DOB: 1975-11-24, 38 y.o.   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 Last visit 1.5 mo ago.  Post-pancreatectomy DM. 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   Last Hba1c: 03/19/2014 ([facility]): HbA1c 9% 01/22/2014 ([facility]): HbA1c 7.6%  She was on: Lantus 10 units at bedtime (if very high CBGs all day: 12 units) NovoLog as follows:  - target: 150 >> 130 - ICR: 15 - ISF: 50 >> 30 She ends up with ~8 units per meal.  She had a period with low CBGs >> we tried to stop Lantus but sugars increased.  She is now on an insulin pump - Medtronic 530 G (751)+ Enlite CGM - started 04/02/2014: Pump settings: - basal rates: 12 am: 0.6 units/h 5 am: 0.8  10 am: 1.0 10 pm: 0.6 - ICR: 12 - target: 120-120 - ISF: 20 - Insulin on Board: 4h - bolus wizard: on - extended bolusing: not using - changes infusion site: q3 days - Meter: [employer] Cont[employer] Link   Ave daily basal: 55% (18.5 units) Ave daily bolus: 45% (15.3 units)  Checks sugars 4-5x a day  - ave 139 +/- 80, lows in am , before meals and sometimes at night: 47-64: - am: 113-314 >> 75-170 (196 x1) >> 160-325 >> 54-137 - lunch: 128-339 >> 70 x1, 89-213 >> 144-376 >> 84-241, 289 - after lunch: 183 >> 208 >> n/c >> 57-191 (1 x <40!) - dinner: 114-283 >> 68 x1, 86-192 (200x3) >> 65, 170-200 >> 133-286 - after dinner: 281 >> n/c >> 176-325 >> 142-274 - bedtime: 154-313 >> 113-187 (200s) >> 121, 221 >> 45, 61, 121-302  Meals: - breakfast: cereal (frosted flakes) + 2% milk - lunch: sandwich; cheese sticks or cops/crackers (more a snack); popcorn; salads - dinner: fast food;  lasagna; ham + veggies - snacks  Euthyroid Hashimoto's thyroiditis  - dx in 08/2013 when she presented with hypothyroid sxs.  She is not on thyroid hh tx.  Last TFTs: Lab Results  Component Value Date   TSH 0.38 01/29/2014   FREET4 1.00 01/29/2014   All labs normal >> we continued off LT4.  - Previously (08/2013): TSH 1.780 Free T4 1.2 (0.82-1.77)  Total T3 130 (71-180) TPO 44 (<34)  Pt denies feeling nodules in neck, hoarseness, dysphagia/odynophagia, SOB with lying down.  Pt describes: - no cold intolerance,  has hot flushes - no weight loss - + fatigue, but feels much better since on the pump - + both: diarrhea/constipation - + dry skin - + hair falling  ROS: Constitutional: see HPI Eyes: no blurry vision, no xerophthalmia ENT: no sore throat, no nodules palpated in throat, occasional dysphagia/no odynophagia, no hoarseness Cardiovascular: no CP/SOB/palpitations/leg swelling Respiratory: no cough/SOB Gastrointestinal: no N/V/+ D/+ C Musculoskeletal: no muscle aches/no joint aches Skin: no rashes, + hair loss Neurological: no tremors/numbness/tingling/dizziness, no HA   I reviewed pt's medications, allergies, PMH, social hx, family hx and no changes required.  PE: BP 108/68 mmHg  Pulse 75  Temp(Src) 98.2 F (36.8 C) (Oral)  Resp 12  Wt 296 lb (134.265 kg)  SpO2 97% Wt Readings from Last 3 Encounters:  04/22/14 296  lb (134.265 kg)  03/06/14 132 lb (59.875 kg)  01/29/14 136 lb (61.689 kg)   Constitutional: normal weight, in NAD Eyes: PERRLA, EOMI, no exophthalmos ENT: moist mucous membranes, no thyromegaly, no cervical lymphadenopathy Cardiovascular: RRR, No MRG Respiratory: CTA B Gastrointestinal: abdomen soft, NT, ND, BS+ Musculoskeletal: no deformities, strength intact in all 4 Skin: moist, warm, no rashes Neurological: no tremor with outstretched hands, DTR normal in all 4  ASSESSMENT: 1. Hashimoto's thyroiditis - euthyroid  2.  Post-pancreatectomy DM - C peptide low: 0.6-0.7  PLAN:  1. Patient with euthyroid Hashimoto's thyroiditis. She appears euthyroid, and her fatigue is better after starting the insulin pump. - no goiter, thyroid nodules, or neck compression symptoms - reviewed together last TFTs, which were normal, 3 mo ago - will check TFTs in 3-6 months  2. Post-pancreatectomy DM - she [facility]. Her transplant failed, and she had to start on an insulin pump. She feels much better after she started this, and likes the fact that her sugars are better. However, she has multiple low blood sugars, mostly between meals and during the night, which is a sign of too much basal insulin. We will reduce the basal rates today. Since her sugars are low before a meal, her pump does not allow her to bolus with quite a few of the meals, and subsequently she has hypoglycemia after meals, for which she boluses to correct. She was then have insulin stacking and the new low after the meal. We decided for now to decrease her basal rates to hopefully stop the vicious cycle described above, and she will need to contact us in 2 days to let us know how her sugars are doing to see if we need to adjust her insulin to carb ratio also. - I advised her to: Patient Instructions  Please change the insulin pump settings as follows: Pump settings: - basal rates: 12 am: 0.6 units/h >> 0.5 5 am: 0.8  10 am: 1.0 >> 0.8 10 pm: 0.6 - ICR: 12 - target: 120-120 - ISF: 20 - Insulin on Board: 4h - bolus wizard: on  Please give Bonita QuinLinda a call regarding your sugars in 2 days to see if there is a need to adjust the insulin to carb ratio, also.  Please return in 1.5 months with your sugar log.   - case d/w Cristy FolksLinda Spagnola (DM educator) while patient was here >> decided to have the pt give us a call in 2 days to see if she also needs a change in ICR, after we decreased the asal rate during the day  -  she will have the HbA1c checked at [facility] next week - had the flu vaccine this season - I will see her in 1.5 mo

## 2014-04-22 NOTE — Patient Instructions (Addendum)
Please change the insulin pump settings as follows: Pump settings: - basal rates: 12 am: 0.6 units/h >> 0.5 5 am: 0.8  10 am: 1.0 >> 0.8 10 pm: 0.6 - ICR: 12 - target: 120-120 - ISF: 20 - Insulin on Board: 4h - bolus wizard: on  Please give Jessica [name] a call regarding your sugars in 2 days to see if there is a need to adjust the insulin to carb ratio, also.  Please return in 1.5 months with your sugar log.

## 2014-04-23 ENCOUNTER — Ambulatory Visit (INDEPENDENT_AMBULATORY_CARE_PROVIDER_SITE_OTHER): Payer: BC Managed Care – PPO | Admitting: Gastroenterology

## 2014-04-23 ENCOUNTER — Encounter: Payer: Self-pay | Admitting: Gastroenterology

## 2014-04-23 VITALS — BP 116/84 | HR 94 | Temp 97.6°F | Ht 66.5 in | Wt 135.0 lb

## 2014-04-23 DIAGNOSIS — K904 Malabsorption due to intolerance, not elsewhere classified: Secondary | ICD-10-CM

## 2014-04-23 DIAGNOSIS — K9049 Malabsorption due to intolerance, not elsewhere classified: Secondary | ICD-10-CM | POA: Insufficient documentation

## 2014-04-23 DIAGNOSIS — Z90410 Acquired total absence of pancreas: Secondary | ICD-10-CM

## 2014-04-23 MED ORDER — PANCRELIPASE (LIP-PROT-AMYL) 36000-114000 UNITS PO CPEP: ORAL_CAPSULE | ORAL | Status: DC

## 2014-04-23 NOTE — Progress Notes (Signed)
Lurena JoinerRebecca made the necessary basal rate changes with very little assistance from me.  Basal settings changed: MN:0.6 to 0.5,  10AM: 1.0 to 0.8. We discussed the need to do 2hr. pc readings to see if the I/C ratios are set correctly, and the low blood sugars she is having is not due to this ratio being wrong.  She was told to test her blood sugars before meals, 2hr. After meals, and at  Bedtime for the next 2 days, and to call me the results on Wednesday.  She agreed to do this.   She reported that she is liking the pump, and that she is not having any problems with changing the cartridge, or infusions sets.  She says that she is rotating her infusion set sites appropriately, and is moving them at least 2 inches from the previous sites.   She had no final questions.

## 2014-04-23 NOTE — Patient Instructions (Signed)
1. I will review your records and let you know what labs are needed at this time. 2. You need to take 144,000 units of lipase with each meals and 72,000 with each snack. There is a creon 36000 which you can take and will reduce your pill load considerably. Let me know if you would like to change.

## 2014-04-23 NOTE — Patient Instructions (Signed)
Test blood sugars before meals, 2hr. After meals, and at bedtime. Call the results to me on Wednesday afternoon

## 2014-04-23 NOTE — Progress Notes (Signed)
Primary Care Physician:  Joaquin CourtsFAVERO,JOHN PATRICK, DO  Primary Gastroenterologist:  Jonette EvaSandi Fields, MD   Chief Complaint  Patient presents with  . Pancreatitis    HPI:  Jessica [name] is a 38 y.o. female here to establish care as a self-referral. Previous GI in [place] (Dr. Woodroe ChenYeaton) but does not plan to go back. Last seen on 04/04/14. Patient not happy with care.  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  Patient reports she develop her first episode of pancreatitis about 5 years ago. However, after her hysterectomy in 06/2012, she started having acute relapsing pancreatitis. She recalls ERCP with sphincterotomy. Reportedly  EUS showed suspicious lesion in the tail of her pancreas. FNA showed focal pancreatitis. She had celiac plexus block which was unsuccessful. All performed by Dr. Woodroe ChenYeaton. Gallbladder removed in the early 2000s. She saw [facility]. Benedetto GoadJohn Evans 06/[facility] for [facility]. He thought she had acute relapsing pancreatitis. MRI Abd 07/2013 showed no focal pancreatic lesion. [facility] early [facility]. 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 Patient reports that she was advised to undergo total pancreatectomy especially in light of FH of pancreatic cancer. As of now, her islet cells have not worked, can take up to a year per patient. She is on insulin pump.   She presents today with complaints of terrible diarrhea. BM 10-15 per day, pure grease. Has increased Creon dose as she has gained weight and increased oral intake but not helping with the diarrhea. Currently on 24,000, 4-6 with meals, 2 with snacks. At grilled chicken salad and french fries recently and started "leaking grease" before she got home. Some  lower abd pain under umbilicus. Some heartburn. No vomiting. Some nausea. Recently increase PPI to BID.   Weight 198 lb prior to getting sick. 160 lb prior to surgery and now 135lb.     I could not obtain op note (did received post-op notes) or ERCP/EUS notes.    Current Outpatient Prescriptions  Medication Sig Dispense Refill  . buprenorphine (BUTRANS) 5 MCG/HR PTWK patch Place 5 mcg onto the skin once a week.    . EPIPEN 2-PAK 0.3 MG/0.3ML SOAJ injection     . estradiol (VIVELLE-DOT) 0.1 MG/24HR patch Place 1 patch onto the skin 2 (two) times a week.    . fenofibrate (TRICOR) 145 MG tablet Take 145 mg by mouth at bedtime.    [name]. glucose blood (BAYER CONTOUR NEXT TEST) test strip Use to test blood sugar 8 times daily as instructed. 800 each 3  . insulin aspart (NOVOLOG) 100 UNIT/ML injection Inject 60 Units into the skin daily. For use in pump. 2 vial 5  . Insulin Pen Needle 31G X 8 MM MISC Use as directed by your diabetes educator    . MICROLET LANCETS MISC USE AS DIRECTED TO TEST SUGAR 100 each 0  . Multiple Vitamins-Minerals (HM MULTIVITAMIN ADULT GUMMY PO) Take 2 each by mouth daily.    . ondansetron (ZOFRAN-ODT) 4 MG disintegrating tablet     . Oxycodone HCl 10 MG TABS Take 10 mg by mouth 3 (three) times daily. 1/2 pill twice daily    . pantoprazole (PROTONIX) 40 MG tablet Take 40 mg by mouth 2 (two) times daily before a meal.    . Vitamin  D, Ergocalciferol, (DRISDOL) 50000 UNITS CAPS capsule Take 50,000 Units by mouth every 7 (seven) days.    [name]. lipase/protease/amylase (CREON) 36000 UNITS CPEP capsule Take four capsules orally with meals and 2 capsules orally with snacks. 480 capsule 5   No current facility-administered medications for this visit.    Allergies as of 04/23/2014 - Review Complete 04/23/2014  Allergen Reaction Noted  . Morphine and related Anaphylaxis 11/15/2013  . Penicillins Anaphylaxis 11/15/2013  . Shellfish allergy Anaphylaxis 11/15/2013  . Doxycycline Hives and  Nausea Only 11/15/2013  . Erythromycin Hives and Nausea Only 11/15/2013  . Keflex [cephalexin] Hives and Nausea Only 11/15/2013  . Levaquin [levofloxacin in d5w] Hives and Nausea Only 11/15/2013  . Statins Other (See Comments) 11/15/2013  . Valium [diazepam] Other (See Comments) 11/15/2013  . Vancomycin Hives and Nausea Only 11/15/2013  . Zetia [ezetimibe] Hives and Nausea Only 11/15/2013  . Sulfa antibiotics Hives and Nausea Only 11/15/2013    Past Medical History  Diagnosis Date  . DM (diabetes mellitus)     post pancreatectomy  . Hashimoto's thyroiditis     euthyroid    Past Surgical History  Procedure Laterality Date  . Pancreatectomy  11/2013    with splenectomy/hepaticojejunostomy and gastrojejunostomy.  . 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  . Complete hysterectomy    . Knee surgery      X 6    Family History  Problem Relation Age of Onset  . Pancreatic cancer Paternal Grandfather   . Cirrhosis Paternal Grandfather   . Pancreatitis Other     cousin  . Colon cancer Neg Hx   . Throat cancer Mother     History   Social History  . Marital Status: Married    Spouse Name: N/A    Number of Children: 0  . Years of Education: N/A   Occupational History  . pharmacy tech    Social History Main Topics  . Smoking status: Smoker, Current Status Unknown -- 1.5 years  . Smokeless tobacco: Not on file  . Alcohol Use: No  . Drug Use: No  . Sexual Activity: Not on file   Other Topics Concern  . Not on file   Social History Narrative      ROS:  General:no fever. See hpi Eyes: Negative for vision changes.  ENT: Negative for hoarseness, difficulty swallowing , nasal congestion. CV: Negative for chest pain, angina, palpitations, dyspnea on exertion, peripheral edema.  Respiratory: Negative for dyspnea at rest, dyspnea on exertion, cough, sputum, wheezing.  GI: See history of present illness. GU:  Negative for dysuria, hematuria, urinary incontinence,  urinary frequency, nocturnal urination.  MS: Negative for joint pain, low back pain.  Derm: Negative for rash or itching.  Neuro: Negative for weakness, abnormal sensation, seizure, frequent headaches, memory loss, confusion.  Psych: Negative for anxiety, depression, suicidal ideation, hallucinations.  Endo: see hpi Heme: Negative for bruising or bleeding. Allergy: Negative for rash or hives.    Physical Examination:  BP 116/84 mmHg  Pulse 94  Temp(Src) 97.6 F (36.4 C) (Oral)  Ht 5' 6.5" (1.689 m)  Wt 135 lb (61.236 kg)  BMI 21.47 kg/m2   General: Well-nourished, well-developed in no acute distress.  Head: Normocephalic, atraumatic.   Eyes: Conjunctiva pink, no icterus. Mouth: Oropharyngeal mucosa moist and pink , no lesions erythema or exudate. Neck: Supple without thyromegaly, masses, or lymphadenopathy.  Lungs: Clear to auscultation bilaterally.  Heart: Regular rate and rhythm, no murmurs rubs or gallops.  Abdomen: Bowel sounds are normal, nontender, nondistended, no hepatosplenomegaly or masses, no abdominal bruits or    hernia , no rebound or guarding.   Rectal: not performed Extremities: No lower extremity edema. No clubbing or deformities.  Neuro: Alert and oriented x 4 , grossly normal neurologically.  Skin: Warm and dry, no rash or jaundice.   Psych: Alert and cooperative, normal mood and affect.  Labs: IgG 4, 62.5 normal 09/2011  [facility]: White blood cell count 11,300, hemoglobin 15.1, MCV 88.7, platelets 394,000. Amylase 11, lipase less than 3, alkaline phosphatase 112, AST 31, ALT 28, total bilirubin 0.6,hemoglobin A1c 9, C-peptide 0.65  Imaging Studies: CT 03/12/2014 ([facility]) showed interval total pancreatectomy and splenectomy without CT findings of complication. New hepatic lesion is likely contusive injury from ligamentum teres flap.    

## 2014-04-24 ENCOUNTER — Telehealth: Payer: Self-pay | Admitting: Nutrition

## 2014-04-24 NOTE — Telephone Encounter (Signed)
Monday night the HS reading was 130, and the FBS on Tuesday was 99.  Tuesday at HS: 119, FBS Wed.: 87.  Both HS readings had no IOB. Says blood sugars are dropping after meals now.  Tuesday blood sugar acL at 2PM was 79.  She ate 60g carb but put only 30 into pump.  Blood sugar 1 hour later was 69.  Ate 3 glucose tablets.  Blood sugar was 92 acS.  Ate 30 g of carbs.  Blood sugar was 64   90 minutes later.   I/C ratio is 8.  Meals are usually 30 grams of carbs.  Boluses are about 3.75u.    Plan:  I changed her I/C to 20.  Bolus should be about 1.5u.  She will call if blood sugars still dropping, or going high.

## 2014-04-25 ENCOUNTER — Encounter: Payer: Self-pay | Admitting: Gastroenterology

## 2014-04-25 NOTE — Assessment & Plan Note (Signed)
38 y/o female who underwent total pancreatectomy with distal gastrectomy, splenectomy and pancreatic auto islet cell transplantation in June 2015 for reportedly chronic pancreatitis. She had a variant in the CFTR gene of unclear significance, family history of pancreatic cancer as well. Postoperatively she has required insulin polyp. Autologous islet cell transplantation possibly a failure. She presents today to establish care. She's had significant steatorrhea, 10-15 greasy stools daily associated with overall weight loss. She is down about 25 pounds since her surgery and 65 pounds since she became ill. Discussed with patient the need to optimize her pancreatic enzyme replacement therapy. Need to take maximum dose allowed at this time with meals and snacks. We will also check for vitamin deficiency especially in light of malabsorption and cravings.

## 2014-04-25 NOTE — Progress Notes (Addendum)
1. Please let patient know we need to get labs on her. Orders entered. 2. Please also make sure she does not exceed the maximum dose of Creon (16 capsules of the 36,000 dose daily). 3. She should take the Creon when she starts the meal (not before). 4. She should go to [email or web address] and order her free fat soluble vitamins from the makers of Creon. 5. Low-fat diet.

## 2014-04-26 NOTE — Progress Notes (Signed)
Tried to call pt- NA- LM on voicemail with dosage instructions and the need for blood work. Asked her to call back on Monday to get the information about the free vitamins.

## 2014-04-26 NOTE — Progress Notes (Signed)
Lab order faxed to the lab. 

## 2014-04-29 ENCOUNTER — Telehealth: Payer: Self-pay | Admitting: Gastroenterology

## 2014-04-29 ENCOUNTER — Encounter: Payer: Self-pay | Admitting: Gastroenterology

## 2014-04-29 NOTE — Telephone Encounter (Signed)
Patient called stating that she is having stomach pain, can her lab work request be sent to [facility] instead of the lab across from the hospital,  Please advise  [phone]

## 2014-04-30 ENCOUNTER — Telehealth: Payer: Self-pay | Admitting: Gastroenterology

## 2014-04-30 NOTE — Telephone Encounter (Signed)
PATIENT CALLED STATING THAT HER OTHER PHYSICIAN STATED THAT THEY BELIEVE SHE IS GETTING AN ULCER.  PLEASE ADVISE IF THERE IS ANYTHING SHE NEEDS TO DO

## 2014-04-30 NOTE — Telephone Encounter (Signed)
I lmom for pt that I will be glad to fax the orders to [facility] and if she will call me back and let me know what number to fax it to.

## 2014-04-30 NOTE — Telephone Encounter (Signed)
PT left fax number of [phone] for me to fax lab orders to [facility].  Faxed.

## 2014-04-30 NOTE — Progress Notes (Signed)
cc'ed to pcp °

## 2014-05-01 ENCOUNTER — Other Ambulatory Visit: Payer: Self-pay

## 2014-05-01 ENCOUNTER — Telehealth: Payer: Self-pay | Admitting: Gastroenterology

## 2014-05-01 ENCOUNTER — Other Ambulatory Visit: Payer: Self-pay | Admitting: Gastroenterology

## 2014-05-01 DIAGNOSIS — R197 Diarrhea, unspecified: Secondary | ICD-10-CM

## 2014-05-01 MED ORDER — PEG-KCL-NACL-NASULF-NA ASC-C 100 G PO SOLR: 1.0000 | ORAL | Status: DC

## 2014-05-01 NOTE — Telephone Encounter (Signed)
See other phone note

## 2014-05-01 NOTE — Telephone Encounter (Signed)
Tried to call patient. LMOAM. 

## 2014-05-01 NOTE — Telephone Encounter (Signed)
Spoke with patient. Just seen last week. Continues to have ongoing epigastric pain associated with nausea. Her surgeon is concerned she may have an anastomotic ulcer.   Since last week, when we increased her creon, she has noted fewer BMs, 4-5 daily, stool not as watery but now associated with fresh blood. No melena. No prior TCS.    Let's schedule her for TCS/EGD with Dr. Jena Gaussourk in the OR due to chronic pain meds ASAP. Patient was tagged as [facility] patient on initial visit but with [facility] being out of town she wants to have procedures done by [facility].  1. Day of bowel prep patient needs to adjust her insulin to 50% of what she usually takes.  2. Moviprep if covered.  3. Let's have her collect stool for C.diff PCR now while we wait on procedure.

## 2014-05-01 NOTE — Progress Notes (Signed)
Forwarded to Ginger to schedule.

## 2014-05-01 NOTE — Telephone Encounter (Signed)
Pt is set up for TCS/EGD on 05/15/14 @830 . Instruction are in the mail

## 2014-05-01 NOTE — Telephone Encounter (Signed)
Per Verlon AuLeslie she will call to address, we will hold off on the office visit

## 2014-05-01 NOTE — Telephone Encounter (Signed)
Please refer to the patient email encounter dated 04/29/14 for EGD/TCS/Cdiff instructions.

## 2014-05-01 NOTE — Telephone Encounter (Signed)
Patient returning call from [facility]. I told her [facility] was not available at the moment. Please call patient back at [phone]

## 2014-05-01 NOTE — Telephone Encounter (Signed)
Please bring in for an ov to discuss.

## 2014-05-01 NOTE — Progress Notes (Signed)
Patient with new complaints of rectal bleeding, lower abd pain, ongoing epigastric pain. See patient email/telephone note.   EGD/TCS planned in OR due to chronic pain meds.  I have discussed the risks, alternatives, benefits with regards to but not limited to the risk of reaction to medication, bleeding, infection, perforation and the patient is agreeable to proceed. Written consent to be obtained.

## 2014-05-03 NOTE — Telephone Encounter (Signed)
Talked with patient she said to keep pump on until day of procedure may titrate dose. Please advise

## 2014-05-03 NOTE — Telephone Encounter (Signed)
OK. Patient should titrate dose according to per blood glucose level and carb intake. Remove pump morning of her procedure per endocrinologist instructions.

## 2014-05-09 NOTE — Patient Instructions (Signed)
Jessica FudgeRebecca L [name]  05/09/2014   Your procedure is scheduled on:  05/15/2014  [facility] at 8:00 AM.  Call this number if you have problems the morning of surgery: [phone]   Remember:   Do not eat food or drink liquids after midnight.   Take these medicines the morning of surgery with A SIP OF WATER: Butrans Patch, Estrace patch, Zofran, Oxycodone, Protonix   Do not wear jewelry, make-up or nail polish.  Do not wear lotions, powders, or perfumes. You may wear deodorant.  Do not shave 48 hours prior to surgery. Men may shave face and neck.  Do not bring valuables to the hospital.  [facility] is not responsible for any belongings or valuables.               Contacts, dentures or bridgework may not be worn into surgery.  Leave suitcase in the car. After surgery it may be brought to your room.  For patients admitted to the hospital, discharge time is determined by your                treatment team.               Patients discharged the day of surgery will not be allowed to drive  home.  Name and phone number of your driver:   Special Instructions: N/A   Please read over the following fact sheets that you were given: Anesthesia Post-op Instructions   PATIENT INSTRUCTIONS POST-ANESTHESIA  IMMEDIATELY FOLLOWING SURGERY:  Do not drive or operate machinery for the first twenty four hours after surgery.  Do not make any important decisions for twenty four hours after surgery or while taking narcotic pain medications or sedatives.  If you develop intractable nausea and vomiting or a severe headache please notify your doctor immediately.  FOLLOW-UP:  Please make an appointment with your surgeon as instructed. You do not need to follow up with anesthesia unless specifically instructed to do so.  WOUND CARE INSTRUCTIONS (if applicable):  Keep a dry clean dressing on the anesthesia/puncture wound site if there is drainage.  Once the wound has quit draining you may leave it open to  air.  Generally you should leave the bandage intact for twenty four hours unless there is drainage.  If the epidural site drains for more than 36-48 hours please call the anesthesia department.  QUESTIONS?:  Please feel free to call your physician or the hospital operator if you have any questions, and they will be happy to assist you.      Esophagogastrectomy Esophagogastrectomy is a surgery to remove a diseased portion of your esophagus. The upper portion of your stomach and some lymph nodes in the area are removed at the same time. In most cases, your stomach is then attached directly to the remaining portion of the esophagus. Esophagogastrectomy is often done as a treatment for cancer or precancerous conditions. The surgery may also be done to treat severe injuries to the esophagus. LET YOUR CAREGIVER KNOW ABOUT:   Allergies to food or medicine.  Medicines taken, including vitamins, herbs, eyedrops, over-the-counter medicines, and creams.  Use of steroids (by mouth or creams).  Previous problems with anesthetics or numbing medicines.  History of bleeding problems or blood clots.  Previous surgery.  Other health problems, including diabetes and kidney problems.  Possibility of pregnancy, if this applies. RISKS AND COMPLICATIONS   Allergies to medicines used during the procedure.  Problems with breathing.  Bleeding.  Infection.  Damage to other structures near the esophagus and stomach.  Problems with swallowing. BEFORE THE PROCEDURE   Stop smoking if you smoke. Stopping will improve your health after surgery.  You may have blood tests to make sure your blood clots normally. Ask your caregiver about changing or stopping your regular medicines. You may be asked to stop taking blood thinners (anticoagulants) or nonsteroidal anti-inflammatory drugs (NSAIDs).  Do not eat or drink anything at least 8 hours before the surgery.  You and your caregiver will talk about the  different surgical approaches. Together, you will agree on the surgical approach that is right for you. PROCEDURE   An intravenous (IV) access tube is put into one of your veins to give you fluids and medicines.  You will receive medicines to relax you (sedatives) and medicines that make you sleep (general anesthetic).  You may have a flexible tube (catheter) put into your bladder to drain urine.  You may have a tube put through your nose or mouth into your stomach (NG tube) to remove digestive juices and prevent you from vomiting and feeling nauseous.  Surgical cuts (incisions) may be made in the throat, chest, or abdomen. Multiple smaller incisions may be made if the surgery can be done using a scope.  The part of the stomach to be removed will be stapled off and taken out.  The diseased length of esophagus will be removed, as well as lymph nodes in the area.  The remaining portion of the stomach will be attached to the remaining portion of the esophagus.  All incisions will be closed with stitches (sutures), staples, or surgical glue. AFTER THE PROCEDURE   You will wake up in a recovery room, resting in bed until you have fully returned to consciousness.  You will have some pain. Pain medicines will be available to help you.  Your temperature, breathing rate, heart rate, blood pressure, and oxygen level (vital signs) will be monitored regularly.  You may be admitted to an intensive care unit (ICU) in the hospital for 1-2 days. There, you can be closely monitored.  The head of your bed will be kept at an upright angle.  You will likely have an NG tube that goes through your nose and into your stomach.  You may have a feeding tube. This tube will give you the proper nutrition until you can take food by mouth again.  You will likely have multiple tubes that are draining fluid from the incision.  You will have a catheter in your bladder.  You will continue to receive IV  fluids.  You may have compression stockings on your legs to prevent blood clots.  You will be taught some breathing exercises. These will help your lungs recover from the anesthesia.  When you are more stable, you will be transferred to a regular hospital room.  You will probably be in the hospital for about 10-14 days. During this time, various drains, tubes, and monitors will slowly be discontinued when they are no longer needed. You will also be slowly eased into doing more activity and drinking and eating by mouth again. Document Released: 12/07/2011 Document Reviewed: 12/07/2011 [facility] Patient Information 2015 [place], [place]. This information is not intended to replace advice given to you by your health care provider. Make sure you discuss any questions you have with your health care provider. Colonoscopy A colonoscopy is an exam to look at the entire large intestine (colon). This exam can help find problems such as tumors,  polyps, inflammation, and areas of bleeding. The exam takes about 1 hour.  LET [facility] PROVIDER KNOW ABOUT:   Any allergies you have.  All medicines you are taking, including vitamins, herbs, eye drops, creams, and over-the-counter medicines.  Previous problems you or members of your family have had with the use of anesthetics.  Any blood disorders you have.  Previous surgeries you have had.  Medical conditions you have. RISKS AND COMPLICATIONS  Generally, this is a safe procedure. However, as with any procedure, complications can occur. Possible complications include:  Bleeding.  Tearing or rupture of the colon wall.  Reaction to medicines given during the exam.  Infection (rare). BEFORE THE PROCEDURE   Ask your health care provider about changing or stopping your regular medicines.  You may be prescribed an oral bowel prep. This involves drinking a large amount of medicated liquid, starting the day before your procedure. The liquid will  cause you to have multiple loose stools until your stool is almost clear or light green. This cleans out your colon in preparation for the procedure.  Do not eat or drink anything else once you have started the bowel prep, unless your health care provider tells you it is safe to do so.  Arrange for someone to drive you home after the procedure. PROCEDURE   You will be given medicine to help you relax (sedative).  You will lie on your side with your knees bent.  A long, flexible tube with a light and camera on the end (colonoscope) will be inserted through the rectum and into the colon. The camera sends video back to a computer screen as it moves through the colon. The colonoscope also releases carbon dioxide gas to inflate the colon. This helps your health care provider see the area better.  During the exam, your health care provider may take a small tissue sample (biopsy) to be examined under a microscope if any abnormalities are found.  The exam is finished when the entire colon has been viewed. AFTER THE PROCEDURE   Do not drive for 24 hours after the exam.  You may have a small amount of blood in your stool.  You may pass moderate amounts of gas and have mild abdominal cramping or bloating. This is caused by the gas used to inflate your colon during the exam.  Ask when your test results will be ready and how you will get your results. Make sure you get your test results. Document Released: 06/04/2000 Document Revised: 03/28/2013 Document Reviewed: 02/12/2013 [facility] Patient Information 2015 [place], [place]. This information is not intended to replace advice given to you by your health care provider. Make sure you discuss any questions you have with your health care provider.

## 2014-05-10 ENCOUNTER — Encounter (HOSPITAL_COMMUNITY)
Admission: RE | Admit: 2014-05-10 | Discharge: 2014-05-10 | Disposition: A | Discharge status: Home / Self Care | Payer: BC Managed Care – PPO | Source: Ambulatory Visit | Attending: Internal Medicine | Admitting: Internal Medicine

## 2014-05-10 ENCOUNTER — Encounter (HOSPITAL_COMMUNITY): Payer: Self-pay

## 2014-05-10 DIAGNOSIS — Z01818 Encounter for other preprocedural examination: Secondary | ICD-10-CM | POA: Diagnosis present

## 2014-05-10 HISTORY — DX: Other specified postprocedural states: Z98.890

## 2014-05-10 HISTORY — DX: Pure hypercholesterolemia, unspecified: E78.00

## 2014-05-10 HISTORY — DX: Nausea with vomiting, unspecified: R11.2

## 2014-05-10 HISTORY — DX: Anxiety disorder, unspecified: F41.9

## 2014-05-10 HISTORY — DX: Gastro-esophageal reflux disease without esophagitis: K21.9

## 2014-05-10 LAB — CBC
HCT: 41.7 % (ref 36.0–46.0)
Hemoglobin: 14.3 g/dL (ref 12.0–15.0)
MCH: 31.1 pg (ref 26.0–34.0)
MCHC: 34.3 g/dL (ref 30.0–36.0)
MCV: 90.7 fL (ref 78.0–100.0)
Platelets: 331 10*3/uL (ref 150–400)
RBC: 4.6 MIL/uL (ref 3.87–5.11)
RDW: 15.4 % (ref 11.5–15.5)
WBC: 14.5 10*3/uL — ABNORMAL HIGH (ref 4.0–10.5)

## 2014-05-10 LAB — BASIC METABOLIC PANEL
Anion gap: 11 (ref 5–15)
BUN: 6 mg/dL (ref 6–23)
CO2: 30 mEq/L (ref 19–32)
Calcium: 9.5 mg/dL (ref 8.4–10.5)
Chloride: 103 mEq/L (ref 96–112)
Creatinine, Ser: 0.71 mg/dL (ref 0.50–1.10)
GFR calc Af Amer: >90 mL/min (ref >90)
GFR calc non Af Amer: >90 mL/min (ref >90)
Glucose, Bld: 72 mg/dL (ref 70–99)
Potassium: 4 mEq/L (ref 3.7–5.3)
Sodium: 144 mEq/L (ref 137–147)

## 2014-05-10 NOTE — Pre-Procedure Instructions (Signed)
Dr Jayme CloudGonzalez aware of patients extensive thyroid and pancreatic history. Ok to use Thyroid studies from 01/29/2014. Patient instructed to dial down insulin pump to basal rate due to NPO after midnight. We will cut insulin pump off and not disconnect it during procedure.

## 2014-05-14 ENCOUNTER — Telehealth: Payer: Self-pay | Admitting: Gastroenterology

## 2014-05-14 NOTE — Telephone Encounter (Signed)
Patient called in with complaints of abdominal cramping since being on clear liquids from yesterday. Multiple loose stools, which is her baseline. Has dealt with abdominal pain since at least earlier this month[facility]. She wanted to let us know that she had not started the prep but was already cramping with each bowel movement. Discussed that a colonoscopy was necessary as per plan; however, if she has ANY worsening of her baseline, she needs to either call us or go to the ED, especially if any fever/chills, abdominal distension. However, her symptoms seem to be baseline and associated with defecation.

## 2014-05-15 ENCOUNTER — Encounter (HOSPITAL_COMMUNITY)
Admission: RE | Disposition: A | Discharge status: Home / Self Care | Payer: Self-pay | Source: Ambulatory Visit | Attending: Internal Medicine

## 2014-05-15 ENCOUNTER — Ambulatory Visit (HOSPITAL_COMMUNITY)
Admission: RE | Admit: 2014-05-15 | Discharge: 2014-05-15 | Disposition: A | Discharge status: Home / Self Care | Payer: BC Managed Care – PPO | Source: Ambulatory Visit | Attending: Internal Medicine | Admitting: Internal Medicine

## 2014-05-15 ENCOUNTER — Ambulatory Visit (HOSPITAL_COMMUNITY): Payer: BC Managed Care – PPO | Admitting: Anesthesiology

## 2014-05-15 ENCOUNTER — Encounter (HOSPITAL_COMMUNITY): Payer: Self-pay

## 2014-05-15 DIAGNOSIS — Z794 Long term (current) use of insulin: Secondary | ICD-10-CM | POA: Diagnosis not present

## 2014-05-15 DIAGNOSIS — E063 Autoimmune thyroiditis: Secondary | ICD-10-CM | POA: Diagnosis not present

## 2014-05-15 DIAGNOSIS — K648 Other hemorrhoids: Secondary | ICD-10-CM | POA: Insufficient documentation

## 2014-05-15 DIAGNOSIS — K644 Residual hemorrhoidal skin tags: Secondary | ICD-10-CM | POA: Diagnosis not present

## 2014-05-15 DIAGNOSIS — K5730 Diverticulosis of large intestine without perforation or abscess without bleeding: Secondary | ICD-10-CM | POA: Diagnosis not present

## 2014-05-15 DIAGNOSIS — E119 Type 2 diabetes mellitus without complications: Secondary | ICD-10-CM | POA: Diagnosis not present

## 2014-05-15 DIAGNOSIS — R197 Diarrhea, unspecified: Secondary | ICD-10-CM

## 2014-05-15 DIAGNOSIS — R1013 Epigastric pain: Secondary | ICD-10-CM | POA: Insufficient documentation

## 2014-05-15 DIAGNOSIS — K861 Other chronic pancreatitis: Secondary | ICD-10-CM | POA: Diagnosis not present

## 2014-05-15 DIAGNOSIS — Z800 Family history of malignant neoplasm of digestive organs: Secondary | ICD-10-CM | POA: Diagnosis not present

## 2014-05-15 DIAGNOSIS — F17210 Nicotine dependence, cigarettes, uncomplicated: Secondary | ICD-10-CM | POA: Diagnosis not present

## 2014-05-15 DIAGNOSIS — K529 Noninfective gastroenteritis and colitis, unspecified: Secondary | ICD-10-CM

## 2014-05-15 HISTORY — PX: BIOPSY: SHX5522

## 2014-05-15 HISTORY — PX: ESOPHAGOGASTRODUODENOSCOPY (EGD) WITH PROPOFOL: SHX5813

## 2014-05-15 HISTORY — PX: COLONOSCOPY WITH PROPOFOL: SHX5780

## 2014-05-15 LAB — GLUCOSE, CAPILLARY
Glucose-Capillary: 160 mg/dL — ABNORMAL HIGH (ref 70–99)
Glucose-Capillary: 109 mg/dL — ABNORMAL HIGH (ref 70–99)

## 2014-05-15 SURGERY — COLONOSCOPY WITH PROPOFOL
Anesthesia: Monitor Anesthesia Care

## 2014-05-15 MED ORDER — MIDAZOLAM HCL 2 MG/2ML IJ SOLN
INTRAMUSCULAR | Status: AC
  Filled 2014-05-15: qty 2

## 2014-05-15 MED ORDER — SCOPOLAMINE 1 MG/3DAYS TD PT72
1.0000 | MEDICATED_PATCH | Freq: Once | TRANSDERMAL | Status: DC
  Administered 2014-05-15: 1.5 mg via TRANSDERMAL

## 2014-05-15 MED ORDER — MIDAZOLAM HCL 5 MG/5ML IJ SOLN
INTRAMUSCULAR | Status: DC | PRN
  Administered 2014-05-15 (×2): 2 mg via INTRAVENOUS

## 2014-05-15 MED ORDER — FENTANYL CITRATE 0.05 MG/ML IJ SOLN
25.0000 ug | INTRAMUSCULAR | Status: AC
  Administered 2014-05-15 (×2): 25 ug via INTRAVENOUS

## 2014-05-15 MED ORDER — PROPOFOL 10 MG/ML IV BOLUS
INTRAVENOUS | Status: AC
  Filled 2014-05-15: qty 20

## 2014-05-15 MED ORDER — PROPOFOL INFUSION 10 MG/ML OPTIME
INTRAVENOUS | Status: DC | PRN
  Administered 2014-05-15: 09:00:00 via INTRAVENOUS
  Administered 2014-05-15: 110 ug/kg/min via INTRAVENOUS

## 2014-05-15 MED ORDER — LIDOCAINE VISCOUS 2 % MT SOLN
OROMUCOSAL | Status: AC
  Administered 2014-05-15: 08:00:00
  Filled 2014-05-15: qty 15

## 2014-05-15 MED ORDER — MIDAZOLAM HCL 2 MG/2ML IJ SOLN
1.0000 mg | INTRAMUSCULAR | Status: DC | PRN
  Administered 2014-05-15: 2 mg via INTRAVENOUS

## 2014-05-15 MED ORDER — SCOPOLAMINE 1 MG/3DAYS TD PT72
MEDICATED_PATCH | TRANSDERMAL | Status: AC
Start: 2014-05-15 — End: 2014-05-15
  Filled 2014-05-15: qty 1

## 2014-05-15 MED ORDER — FENTANYL CITRATE 0.05 MG/ML IJ SOLN
INTRAMUSCULAR | Status: AC
  Filled 2014-05-15: qty 2

## 2014-05-15 MED ORDER — ONDANSETRON HCL 4 MG/2ML IJ SOLN: 4.0000 mg | Freq: Once | INTRAMUSCULAR | Status: DC | PRN

## 2014-05-15 MED ORDER — ONDANSETRON HCL 4 MG/2ML IJ SOLN
4.0000 mg | Freq: Once | INTRAMUSCULAR | Status: AC
  Administered 2014-05-15: 4 mg via INTRAVENOUS

## 2014-05-15 MED ORDER — DEXAMETHASONE SODIUM PHOSPHATE 4 MG/ML IJ SOLN
4.0000 mg | Freq: Once | INTRAMUSCULAR | Status: AC
  Administered 2014-05-15: 4 mg via INTRAVENOUS

## 2014-05-15 MED ORDER — DEXAMETHASONE SODIUM PHOSPHATE 4 MG/ML IJ SOLN
INTRAMUSCULAR | Status: AC
  Filled 2014-05-15: qty 1

## 2014-05-15 MED ORDER — FENTANYL CITRATE 0.05 MG/ML IJ SOLN: 25.0000 ug | INTRAMUSCULAR | Status: DC | PRN

## 2014-05-15 MED ORDER — ONDANSETRON HCL 4 MG/2ML IJ SOLN
INTRAMUSCULAR | Status: AC
  Filled 2014-05-15: qty 2

## 2014-05-15 MED ORDER — STERILE WATER FOR IRRIGATION IR SOLN
Status: DC | PRN
  Administered 2014-05-15: 1000 mL

## 2014-05-15 MED ORDER — LACTATED RINGERS IV SOLN
INTRAVENOUS | Status: DC
  Administered 2014-05-15: 08:00:00 via INTRAVENOUS

## 2014-05-15 SURGICAL SUPPLY — 26 items
BLOCK BITE 60FR ADLT L/F BLUE (MISCELLANEOUS) ×2 IMPLANT
DEVICE CLIP HEMOSTAT 235CM (CLIP) IMPLANT
ELECT REM PT RETURN 9FT ADLT (ELECTROSURGICAL)
ELECTRODE REM PT RTRN 9FT ADLT (ELECTROSURGICAL) IMPLANT
FCP BXJMBJMB 240X2.8X (CUTTING FORCEPS)
FLOOR PAD 36X40 (MISCELLANEOUS) ×2
FORCEPS BIOP RAD 4 LRG CAP 4 (CUTTING FORCEPS) ×4 IMPLANT
FORCEPS BIOP RJ4 240 W/NDL (CUTTING FORCEPS)
FORCEPS BXJMBJMB 240X2.8X (CUTTING FORCEPS) IMPLANT
FORMALIN 10 PREFIL 20ML (MISCELLANEOUS) ×2 IMPLANT
INJECTOR/SNARE I SNARE (MISCELLANEOUS) IMPLANT
KIT CLEAN ENDO COMPLIANCE (KITS) ×2 IMPLANT
LUBRICANT JELLY 4.5OZ STERILE (MISCELLANEOUS) ×2 IMPLANT
MANIFOLD NEPTUNE II (INSTRUMENTS) ×2 IMPLANT
NEEDLE SCLEROTHERAPY 25GX240 (NEEDLE) IMPLANT
PAD FLOOR 36X40 (MISCELLANEOUS) ×1 IMPLANT
PROBE APC STR FIRE (PROBE) IMPLANT
PROBE INJECTION GOLD (MISCELLANEOUS)
PROBE INJECTION GOLD 7FR (MISCELLANEOUS) IMPLANT
SNARE ROTATE MED OVAL 20MM (MISCELLANEOUS) IMPLANT
SNARE SHORT THROW 13M SML OVAL (MISCELLANEOUS) IMPLANT
SYR 50ML LL SCALE MARK (SYRINGE) ×2 IMPLANT
SYR INFLATION 60ML (SYRINGE) IMPLANT
TRAP SPECIMEN MUCOUS 40CC (MISCELLANEOUS) ×2 IMPLANT
TUBING IRRIGATION ENDOGATOR (MISCELLANEOUS) ×2 IMPLANT
WATER STERILE IRR 1000ML POUR (IV SOLUTION) ×2 IMPLANT

## 2014-05-15 NOTE — Anesthesia Preprocedure Evaluation (Signed)
Anesthesia Evaluation  Patient identified by MRN, date of birth, ID band Patient awake    Reviewed: Allergy & Precautions, H&P , NPO status , Patient's Chart, lab work & pertinent test results  History of Anesthesia Complications (+) PONV and history of anesthetic complications  Airway Mallampati: II  TM Distance: >3 FB     Dental  (+) Edentulous Upper, Poor Dentition, Chipped, Dental Advisory Given,    Pulmonary neg pulmonary ROS,  breath sounds clear to auscultation        Cardiovascular negative cardio ROS  Rhythm:Regular Rate:Normal     Neuro/Psych PSYCHIATRIC DISORDERS Anxiety    GI/Hepatic GERD-  Medicated,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   Endo/Other  diabetes (insulin pump at 30% basal rate now.), Well Controlled, Type 1, Oral Hypoglycemic Agents, Insulin DependentHypothyroidism (hashimotos thyroiditis - euthyroid now)   Renal/GU      Musculoskeletal   Abdominal   Peds  Hematology   Anesthesia Other Findings   Reproductive/Obstetrics                             Anesthesia Physical Anesthesia Plan  ASA: III  Anesthesia Plan: MAC   Post-op Pain Management:    Induction: Intravenous  Airway Management Planned: Simple Face Mask  Additional Equipment:   Intra-op Plan:   Post-operative Plan:   Informed Consent: I have reviewed the patients History and Physical, chart, labs and discussed the procedure including the risks, benefits and alternatives for the proposed anesthesia with the patient or authorized representative who has indicated his/her understanding and acceptance.     Plan Discussed with:   Anesthesia Plan Comments:         Anesthesia Quick Evaluation

## 2014-05-15 NOTE — H&P (View-Only) (Signed)
Primary Care Physician:  Joaquin CourtsFAVERO,JOHN PATRICK, DO  Primary Gastroenterologist:  Jonette EvaSandi Fields, MD   Chief Complaint  Patient presents with  . Pancreatitis    HPI:  Jessica [name] is a 38 y.o. female here to establish care as a self-referral. [place] in [place] (Dr. Woodroe ChenYeaton) but does not plan to go back. Last seen on 04/04/14. Patient not happy with care.  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  Patient reports she develop her first episode of pancreatitis about 5 years ago. However, after her hysterectomy in 06/2012, she started having acute relapsing pancreatitis. She recalls ERCP with sphincterotomy. Reportedly  EUS showed suspicious lesion in the tail of her pancreas. FNA showed focal pancreatitis. She had celiac plexus block which was unsuccessful. All performed by Dr. Woodroe ChenYeaton. Gallbladder removed in the early 2000s. She [facility] Benedetto GoadJohn Evans [facility]. He thought she had acute relapsing pancreatitis. MRI Abd 07/2013 showed no focal pancreatic lesion. [facility]. 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 Patient reports that she was advised to undergo total pancreatectomy especially in light of FH of pancreatic cancer. As of now, her islet cells have not worked, can take up to a year per patient. She is on insulin pump.   She presents today with complaints of terrible diarrhea. BM 10-15 per day, pure grease. Has increased Creon dose as she has gained weight and increased oral intake but not helping with the diarrhea. Currently on 24,000, 4-6 with meals, 2 with snacks. At grilled chicken salad and french fries recently and started "leaking grease" before she got home. Some  lower abd pain under umbilicus. Some heartburn. No vomiting. Some nausea. Recently increase PPI to BID.   Weight 198 lb prior to getting sick. 160 lb prior to surgery and now 135lb.     I could not obtain op note (did received post-op notes) or ERCP/EUS notes.    Current Outpatient Prescriptions  Medication Sig Dispense Refill  . buprenorphine (BUTRANS) 5 MCG/HR PTWK patch Place 5 mcg onto the skin once a week.    . EPIPEN 2-PAK 0.3 MG/0.3ML SOAJ injection     . estradiol (VIVELLE-DOT) 0.1 MG/24HR patch Place 1 patch onto the skin 2 (two) times a week.    . fenofibrate (TRICOR) 145 MG tablet Take 145 mg by mouth at bedtime.    [name]. glucose blood (BAYER CONTOUR NEXT TEST) test strip Use to test blood sugar 8 times daily as instructed. 800 each 3  . insulin aspart (NOVOLOG) 100 UNIT/ML injection Inject 60 Units into the skin daily. For use in pump. 2 vial 5  . Insulin Pen Needle 31G X 8 MM MISC Use as directed by your diabetes educator    . MICROLET LANCETS MISC USE AS DIRECTED TO TEST SUGAR 100 each 0  . Multiple Vitamins-Minerals (HM MULTIVITAMIN ADULT GUMMY PO) Take 2 each by mouth daily.    . ondansetron (ZOFRAN-ODT) 4 MG disintegrating tablet     . Oxycodone HCl 10 MG TABS Take 10 mg by mouth 3 (three) times daily. 1/2 pill twice daily    . pantoprazole (PROTONIX) 40 MG tablet Take 40 mg by mouth 2 (two) times daily before a meal.    . Vitamin  D, Ergocalciferol, (DRISDOL) 50000 UNITS CAPS capsule Take 50,000 Units by mouth every 7 (seven) days.    [name]. lipase/protease/amylase (CREON) 36000 UNITS CPEP capsule Take four capsules orally with meals and 2 capsules orally with snacks. 480 capsule 5   No current facility-administered medications for this visit.    Allergies as of 04/23/2014 - Review Complete 04/23/2014  Allergen Reaction Noted  . Morphine and related Anaphylaxis 11/15/2013  . Penicillins Anaphylaxis 11/15/2013  . Shellfish allergy Anaphylaxis 11/15/2013  . Doxycycline Hives and  Nausea Only 11/15/2013  . Erythromycin Hives and Nausea Only 11/15/2013  . Keflex [cephalexin] Hives and Nausea Only 11/15/2013  . Levaquin [levofloxacin in d5w] Hives and Nausea Only 11/15/2013  . Statins Other (See Comments) 11/15/2013  . Valium [diazepam] Other (See Comments) 11/15/2013  . Vancomycin Hives and Nausea Only 11/15/2013  . Zetia [ezetimibe] Hives and Nausea Only 11/15/2013  . Sulfa antibiotics Hives and Nausea Only 11/15/2013    Past Medical History  Diagnosis Date  . DM (diabetes mellitus)     post pancreatectomy  . Hashimoto's thyroiditis     euthyroid    Past Surgical History  Procedure Laterality Date  . Pancreatectomy  11/2013    with splenectomy/hepaticojejunostomy and gastrojejunostomy.  . 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  . Complete hysterectomy    . Knee surgery      X 6    Family History  Problem Relation Age of Onset  . Pancreatic cancer Paternal Grandfather   . Cirrhosis Paternal Grandfather   . Pancreatitis Other     cousin  . Colon cancer Neg Hx   . Throat cancer Mother     History   Social History  . Marital Status: Married    Spouse Name: N/A    Number of Children: 0  . Years of Education: N/A   Occupational History  . pharmacy tech    Social History Main Topics  . Smoking status: Smoker, Current Status Unknown -- 1.5 years  . Smokeless tobacco: Not on file  . Alcohol Use: No  . Drug Use: No  . Sexual Activity: Not on file   Other Topics Concern  . Not on file   Social History Narrative      ROS:  General:no fever. See hpi Eyes: Negative for vision changes.  ENT: Negative for hoarseness, difficulty swallowing , nasal congestion. CV: Negative for chest pain, angina, palpitations, dyspnea on exertion, peripheral edema.  Respiratory: Negative for dyspnea at rest, dyspnea on exertion, cough, sputum, wheezing.  GI: See history of present illness. GU:  Negative for dysuria, hematuria, urinary incontinence,  urinary frequency, nocturnal urination.  MS: Negative for joint pain, low back pain.  Derm: Negative for rash or itching.  Neuro: Negative for weakness, abnormal sensation, seizure, frequent headaches, memory loss, confusion.  Psych: Negative for anxiety, depression, suicidal ideation, hallucinations.  Endo: see hpi Heme: Negative for bruising or bleeding. Allergy: Negative for rash or hives.    Physical Examination:  BP 116/84 mmHg  Pulse 94  Temp(Src) 97.6 F (36.4 C) (Oral)  Ht 5' 6.5" (1.689 m)  Wt 135 lb (61.236 kg)  BMI 21.47 kg/m2   General: Well-nourished, well-developed in no acute distress.  Head: Normocephalic, atraumatic.   Eyes: Conjunctiva pink, no icterus. Mouth: Oropharyngeal mucosa moist and pink , no lesions erythema or exudate. Neck: Supple without thyromegaly, masses, or lymphadenopathy.  Lungs: Clear to auscultation bilaterally.  Heart: Regular rate and rhythm, no murmurs rubs or gallops.  Abdomen: Bowel sounds are normal, nontender, nondistended, no hepatosplenomegaly or masses, no abdominal bruits or    hernia , no rebound or guarding.   Rectal: not performed Extremities: No lower extremity edema. No clubbing or deformities.  Neuro: Alert and oriented x 4 , grossly normal neurologically.  Skin: Warm and dry, no rash or jaundice.   Psych: Alert and cooperative, normal mood and affect.  Labs: IgG 4, 62.5 normal 09/[facility]  [facility]: White blood cell count 11,300, hemoglobin 15.1, MCV 88.7, platelets 394,000. Amylase 11, lipase less than 3, alkaline phosphatase 112, AST 31, ALT 28, total bilirubin 0.6,hemoglobin A1c 9, C-peptide 0.65  Imaging Studies: CT 03/12/2014 [facility]) showed interval total pancreatectomy and splenectomy without CT findings of complication. New hepatic lesion is likely contusive injury from ligamentum teres flap.

## 2014-05-15 NOTE — Anesthesia Postprocedure Evaluation (Signed)
  Anesthesia Post-op Note  Patient: Jessica [name]  Procedure(s) Performed: Procedure(s) with comments: COLONOSCOPY WITH PROPOFOL (N/A) - Cecum time in  0821     time out    0834    total time 13 mintues ESOPHAGOGASTRODUODENOSCOPY (EGD) WITH PROPOFOL (N/A) GASTIC,  ASCENDING, AND DESCEDNING/SIGMOID  COLON BIOPSY (N/A)  Patient Location: PACU  Anesthesia Type:MAC  Level of Consciousness: awake, alert , oriented and patient cooperative  Airway and Oxygen Therapy: Patient Spontanous Breathing  Post-op Pain: none  Post-op Assessment: Post-op Vital signs reviewed, Patient's Cardiovascular Status Stable, Respiratory Function Stable, Patent Airway and Pain level controlled  Post-op Vital Signs: Reviewed and stable  Last Vitals:  Filed Vitals:   05/15/14 0946  BP: 99/59  Pulse:   Temp: 36.6 C  Resp: 20    Complications: No apparent anesthesia complications

## 2014-05-15 NOTE — Op Note (Signed)
[facility] [address] [place], [id number]   ENDOSCOPY PROCEDURE REPORT  PATIENT: Jessica [name], Jessica [name]  MR#: [id number] BIRTHDATE: [birth year] , 38  yrs. old GENDER: female ENDOSCOPIST: R.  Roetta SessionsMichael Javia Dillow, MD FACP FACG REFERRED BY:  Thereasa SoloJohn P Favero, M.D. PROCEDURE DATE:  05/15/2014 PROCEDURE:  EGD w/ biopsy INDICATIONS:  epigastric pain; prior history of hemigastrectomy. MEDICATIONS: Deep sedation per [facility]. ASA CLASS:      Class III  CONSENT: The risks, benefits, limitations, alternatives and imponderables have been discussed.  The potential for biopsy, esophogeal dilation, etc. have also been reviewed.  Questions have been answered.  All parties agreeable.  Please see the history and physical in the medical record for more information.  DESCRIPTION OF PROCEDURE: After the risks benefits and alternatives of the procedure were thoroughly explained, informed consent was obtained.  The    endoscope was introduced through the mouth and advanced to the second portion of the duodenum , limited by Without limitations. The instrument was slowly withdrawn as the mucosa was fully examined.    Normal appearing esophageal mucosa.  Stomach surgically altered. Residual gastric mucosa beefy red with quite a bit of overlying bile-stained mucus.  No ulcer or infiltrating process.  Efferent and afferent small bowel limb limbs identified at the anastomosis. Both patent a good 10-15 cm and appeared normal. Biopsies of the abnormal appearing gastric mucosa taken for histologic study.  Retroflexed views revealed no abnormalities and Retroflexed views revealed as previously described.     The scope was then withdrawn from the patient and the procedure completed.  COMPLICATIONS: There were no immediate complications.  ENDOSCOPIC IMPRESSION: Normal esophagus. Status post hemigastrectomy with Billroth II configuration. Inflamed appearing residual gastric mucosa  of uncertain clinical significance?"status post gastric biopsy  RECOMMENDATIONS: Follow up on pathology. See colonoscopy report.  REPEAT EXAM:  eSigned:  R. Roetta SessionsMichael Kain Milosevic, MD Jerrel IvoryFACP [facility] 05/15/2014 9:52 AM    CC:  CPT CODES: ICD CODES:  The ICD and CPT codes recommended by this software are interpretations from the data that the clinical staff has captured with the software.  The verification of the translation of this report to the ICD and CPT codes and modifiers is the sole responsibility of the health care institution and practicing physician where this report was generated.  [employer] will not be held responsible for the validity of the ICD and CPT codes included on this report.  AMA assumes no liability for data contained or not contained herein. CPT is a Publishing rights managerregistered trademark of the [employer].  PATIENT NAME:  Jessica [name], Jessica [name] MR#: [id number]

## 2014-05-15 NOTE — Progress Notes (Signed)
Patient drank half of movey prep. States she is running clear. Patient states she spoke with Dr. Isidore Moosffice and they said that half was good because the prep was making her sick

## 2014-05-15 NOTE — Interval H&P Note (Signed)
History and Physical Interval Note:  05/15/2014 8:46 AM  Jessica [name]  has presented today for surgery, with the diagnosis of epigastric pain/nausea/diarrhea/rectal bleeding  The various methods of treatment have been discussed with the patient and family. After consideration of risks, benefits and other options for treatment, the patient has consented to  Procedure(s) with comments: COLONOSCOPY WITH PROPOFOL (N/A) - 0930 - moved to 8:30 ESOPHAGOGASTRODUODENOSCOPY (EGD) WITH PROPOFOL (N/A) as a surgical intervention .  The patient's history has been reviewed, patient examined, no change in status, stable for surgery.  I have reviewed the patient's chart and labs.  Questions were answered to the patient's satisfaction.     Kasey Ewings  No change. EGD and colonoscopy per plan.The risks, benefits, limitations, imponderables and alternatives regarding both EGD and colonoscopy have been reviewed with the patient. Questions have been answered. All parties agreeable.

## 2014-05-15 NOTE — Transfer of Care (Signed)
Immediate Anesthesia Transfer of Care Note  Patient: Doree FudgeRebecca L Basich  Procedure(s) Performed: Procedure(s) with comments: COLONOSCOPY WITH PROPOFOL (N/A) - Cecum time in  0821     time out    0834    total time 13 mintues ESOPHAGOGASTRODUODENOSCOPY (EGD) WITH PROPOFOL (N/A) GASTIC,  ASCENDING, AND DESCEDNING/SIGMOID  COLON BIOPSY (N/A)  Patient Location: PACU  Anesthesia Type:MAC  Level of Consciousness: awake, alert , oriented and patient cooperative  Airway & Oxygen Therapy: Patient Spontanous Breathing and Patient connected to face mask oxygen  Post-op Assessment: Report given to PACU RN  Post vital signs: Reviewed and stable  Complications: No apparent anesthesia complications

## 2014-05-15 NOTE — Discharge Instructions (Signed)
Colonoscopy Discharge Instructions  Read the instructions outlined below and refer to this sheet in the next few weeks. These discharge instructions provide you with general information on caring for yourself after you leave the hospital. Your doctor may also give you specific instructions. While your treatment has been planned according to the most current medical practices available, unavoidable complications occasionally occur. If you have any problems or questions after discharge, call Dr. Gala Romney at [phone]. ACTIVITY  You may resume your regular activity, but move at a slower pace for the next 24 hours.   Take frequent rest periods for the next 24 hours.   Walking will help get rid of the air and reduce the bloated feeling in your belly (abdomen).   No driving for 24 hours (because of the medicine (anesthesia) used during the test).    Do not sign any important legal documents or operate any machinery for 24 hours (because of the anesthesia used during the test).  NUTRITION  Drink plenty of fluids.   You may resume your normal diet as instructed by your doctor.   Begin with a light meal and progress to your normal diet. Heavy or fried foods are harder to digest and may make you feel sick to your stomach (nauseated).   Avoid alcoholic beverages for 24 hours or as instructed.  MEDICATIONS  You may resume your normal medications unless your doctor tells you otherwise.  WHAT YOU CAN EXPECT TODAY  Some feelings of bloating in the abdomen.   Passage of more gas than usual.   Spotting of blood in your stool or on the toilet paper.  IF YOU HAD POLYPS REMOVED DURING THE COLONOSCOPY:  No aspirin products for 7 days or as instructed.   No alcohol for 7 days or as instructed.   Eat a soft diet for the next 24 hours.  FINDING OUT THE RESULTS OF YOUR TEST Not all test results are available during your visit. If your test results are not back during the visit, make an appointment  with your caregiver to find out the results. Do not assume everything is normal if you have not heard from your caregiver or the medical facility. It is important for you to follow up on all of your test results.  SEEK IMMEDIATE MEDICAL ATTENTION IF:  You have more than a spotting of blood in your stool.   Your belly is swollen (abdominal distention).   You are nauseated or vomiting.   You have a temperature over 101.  You have abdominal pain or discomfort that is severe or gets worse throughout the day. EGD Discharge instructions Please read the instructions outlined below and refer to this sheet in the next few weeks. These discharge instructions provide you with general information on caring for yourself after you leave the hospital. Your doctor may also give you specific instructions. While your treatment has been planned according to the most current medical practices available, unavoidable complications occasionally occur. If you have any problems or questions after discharge, please call your doctor. ACTIVITY You may resume your regular activity but move at a slower pace for the next 24 hours.  Take frequent rest periods for the next 24 hours.  Walking will help expel (get rid of) the air and reduce the bloated feeling in your abdomen.  No driving for 24 hours (because of the anesthesia (medicine) used during the test).  You may shower.  Do not sign any important legal documents or operate any machinery for 24  hours (because of the anesthesia used during the test).  NUTRITION Drink plenty of fluids.  You may resume your normal diet.  Begin with a light meal and progress to your normal diet.  Avoid alcoholic beverages for 24 hours or as instructed by your caregiver.  MEDICATIONS You may resume your normal medications unless your caregiver tells you otherwise.  WHAT YOU CAN EXPECT TODAY You may experience abdominal discomfort such as a feeling of fullness or gas pains.   FOLLOW-UP Your doctor will discuss the results of your test with you.  SEEK IMMEDIATE MEDICAL ATTENTION IF ANY OF THE FOLLOWING OCCUR: Excessive nausea (feeling sick to your stomach) and/or vomiting.  Severe abdominal pain and distention (swelling).  Trouble swallowing.  Temperature over 101 F (37.8 C).  Rectal bleeding or vomiting of blood.    Diverticulosis information provided  Further recommendations to follow pending review of pathology report  Office follow-up appointment with Dr. Darrick PennaFields in 4-6 weeks  Diverticulosis Diverticulosis is the condition that develops when small pouches (diverticula) form in the wall of your colon. Your colon, or large intestine, is where water is absorbed and stool is formed. The pouches form when the inside layer of your colon pushes through weak spots in the outer layers of your colon. CAUSES  No one knows exactly what causes diverticulosis. RISK FACTORS  Being older than 50. Your risk for this condition increases with age. Diverticulosis is rare in people younger than 40 years. By age 40, almost everyone has it.  Eating a low-fiber diet.  Being frequently constipated.  Being overweight.  Not getting enough exercise.  Smoking.  Taking over-the-counter pain medicines, like aspirin and ibuprofen. SYMPTOMS  Most people with diverticulosis do not have symptoms. DIAGNOSIS  Because diverticulosis often has no symptoms, health care providers often discover the condition during an exam for other colon problems. In many cases, a health care provider will diagnose diverticulosis while using a flexible scope to examine the colon (colonoscopy). TREATMENT  If you have never developed an infection related to diverticulosis, you may not need treatment. If you have had an infection before, treatment may include:  Eating more fruits, vegetables, and grains.  Taking a fiber supplement.  Taking a live bacteria supplement (probiotic).  Taking  medicine to relax your colon. HOME CARE INSTRUCTIONS   Drink at least 6-8 glasses of water each day to prevent constipation.  Try not to strain when you have a bowel movement.  Keep all follow-up appointments. If you have had an infection before:  Increase the fiber in your diet as directed by your health care provider or dietitian.  Take a dietary fiber supplement if your health care provider approves.  Only take medicines as directed by your health care provider. SEEK MEDICAL CARE IF:   You have abdominal pain.  You have bloating.  You have cramps.  You have not gone to the bathroom in 3 days. SEEK IMMEDIATE MEDICAL CARE IF:   Your pain gets worse.  Yourbloating becomes very bad.  You have a fever or chills, and your symptoms suddenly get worse.  You begin vomiting.  You have bowel movements that are bloody or black. MAKE SURE YOU:  Understand these instructions.  Will watch your condition.  Will get help right away if you are not doing well or get worse. Document Released: 03/04/2004 Document Revised: 06/12/2013 Document Reviewed: 05/02/2013 [facility] Patient Information 2015 [employer] [place]. This information is not intended to replace advice given to you by your health  care provider. Make sure you discuss any questions you have with your health care provider.

## 2014-05-15 NOTE — Op Note (Signed)
[facility] [address] [place], [id number]   COLONOSCOPY PROCEDURE REPORT  PATIENT: Jessica [name], Jessica [name]  MR#: [id number] BIRTHDATE: Jul 11, 1975 , 38  yrs. old GENDER: female ENDOSCOPIST: R.  Roetta SessionsMichael Rourk, MD FACP [facility] REFERRED XB:JYNWBY:John Ned GraceP Favero, M.D. PROCEDURE DATE:  05/15/2014 PROCEDURE:   Colonoscopy with biopsy INDICATIONS:chronic diarrhea; paper hematochezia. MEDICATIONS: Deep sedation per Dr.  [facility]. ASA CLASS:       Class III  CONSENT: The risks, benefits, alternatives and imponderables including but not limited to bleeding, perforation as well as the possibility of a missed lesion have been reviewed.  The potential for biopsy, lesion removal, etc. have also been discussed. Questions have been answered.  All parties agreeable.  Please see the history and physical in the medical record for more information.  DESCRIPTION OF PROCEDURE:   After the risks benefits and alternatives of the procedure were thoroughly explained, informed consent was obtained.  The digital rectal exam      The endoscope was introduced through the anus and advanced to the   . No adverse events experienced.   The quality of the prep was adequate.  The instrument was then slowly withdrawn as the colon was fully examined.      COLON FINDINGS: (1) external hemorrhoidal tag identified on digital rectal exam.  Rectal mucosa otherwise appeared normal.  Few scattered left-sided diverticula; the remainder of the colonic mucosa appeared normal.  The distal 10 cm of terminal ileal mucosa also appeared normal.  Segment biopsies ascending and descending/sigmoid segments taken to evaluate for microscopic colitis.  Also, a stool sample was submitted for GI pathogen panel.  Retroflexion was performed. .  Withdrawal time=13 minutes 0 seconds.  The scope was withdrawn and the procedure completed. COMPLICATIONS: There were no immediate complications.  ENDOSCOPIC  IMPRESSION: External and internal hemorrhoids?"likely source of hematochezia. Colonic diverticulosis. Status post segmental biopsy and stool sampling.  RECOMMENDATIONS: Follow-up on stool studies and pathology. See EGD report. Patient may be a reasonably good hemorrhoid banding candidate (for at least the internal hemorrhoids). Follow-up appointment in the office with Dr. Darrick PennaFields in about 4 weeks  eSigned:  R. Roetta SessionsMichael Rourk, MD Jerrel IvoryFACP [facility] 05/15/2014 10:32 AM   cc:  CPT CODES: ICD CODES:  The ICD and CPT codes recommended by this software are interpretations from the data that the clinical staff has captured with the software.  The verification of the translation of this report to the ICD and CPT codes and modifiers is the sole responsibility of the health care institution and practicing physician where this report was generated.  [employer] will not be held responsible for the validity of the ICD and CPT codes included on this report.  AMA assumes no liability for data contained or not contained herein. CPT is a Publishing rights managerregistered trademark of the [employer].

## 2014-05-17 LAB — GI PATHOGEN PANEL BY PCR, STOOL
C difficile toxin A/B: NEGATIVE
Campylobacter by PCR: NEGATIVE
Cryptosporidium by PCR: NEGATIVE
E coli (ETEC) LT/ST: NEGATIVE
E coli (STEC): NEGATIVE
E coli 0157 by PCR: NEGATIVE
G lamblia by PCR: NEGATIVE
Norovirus GI/GII: NEGATIVE
Rotavirus A by PCR: NEGATIVE
Salmonella by PCR: NEGATIVE
Shigella by PCR: NEGATIVE

## 2014-05-18 ENCOUNTER — Encounter: Payer: Self-pay | Admitting: Internal Medicine

## 2014-05-20 ENCOUNTER — Telehealth: Payer: Self-pay

## 2014-05-20 ENCOUNTER — Encounter (HOSPITAL_COMMUNITY): Payer: Self-pay | Admitting: Internal Medicine

## 2014-05-20 MED ORDER — HYOSCYAMINE SULFATE 0.125 MG SL SUBL: 0.1250 mg | SUBLINGUAL_TABLET | SUBLINGUAL | Status: DC | PRN

## 2014-05-20 NOTE — Telephone Encounter (Signed)
I sent in Levsin to take every 4 hours as needed for cramping. This seems to be a chronic issue, but if it is worse since the colonoscopy and severe, needs to seek medical attention.

## 2014-05-20 NOTE — Telephone Encounter (Signed)
LMOM to call.

## 2014-05-20 NOTE — Telephone Encounter (Signed)
Pt called and said that she has had worse abdominal cramping since her procedure last week. The pain/cramping is constant and it does change in severity. She rates the pain at an 8 now and said it is worse when she has a BM. I told her if it worsens to go to the ED and I will send note to Jessica HallsAnna Sams, NP in Leslie's absence today. ( Dr.Rourk did procedure, but pt is to follow up with Dr. Darrick PennaFields)

## 2014-05-21 NOTE — Telephone Encounter (Signed)
Pt returned call and was informed.  

## 2014-05-22 ENCOUNTER — Encounter: Payer: Self-pay | Admitting: Gastroenterology

## 2014-05-23 MED ORDER — PANCRELIPASE (LIP-PROT-AMYL) 40000-136000 UNITS PO CPEP: ORAL_CAPSULE | ORAL | Status: DC

## 2014-05-23 MED ORDER — HYOSCYAMINE SULFATE 0.125 MG SL SUBL: SUBLINGUAL_TABLET | SUBLINGUAL | Status: DC

## 2014-05-23 NOTE — Progress Notes (Signed)
REVIEWED.  

## 2014-05-23 NOTE — Telephone Encounter (Signed)
Pt may change to Zen-pep and add hyoscyamine 30 mins prior to meals and at bedtime.

## 2014-05-30 NOTE — Progress Notes (Signed)
Quick Note:  Mild leukocytosis Labs from outside facility revealed vitamin K1 less than 0.13 low, vitamin E 7.5, vitamin A 29, vitamin D 25 total 26.4 low, iron 164 high, TIBC 405 high, iron saturations 41%, ferritin 22.3, folate 18.4, B12 561.  Please let patient know she should take fat soluble vitamins (currently deficient in D and K). Zenpep should be sending her free vitamins with every RX. See if she is getting them. If not we need to get her signed up ASAP.    ______

## 2014-05-30 NOTE — Progress Notes (Signed)
Quick Note:  I called pt and she is aware that we will mail order for repeat labs in 3 months.  She said she has the paper word to get the vitamins from Zenpep and she will have her PCP sign that if it is OK.  Diarrhea is not as watery, still has 5-6 very loose stools daily, but not watery. She said the Levsin is helping a lot.  Please advise if pt should do the Xifaxin. ______

## 2014-05-30 NOTE — Progress Notes (Signed)
Quick Note:  We will need to repeat her iron, ferritin, vitamin K, vitamin D 25 total, CBC in 3 months.  Make sure she is on the fat soluble vitamins as previously outlined from Zenpe[employer] people. This will help correct her efficiencies. See how her diarrhea is. If she continues to have lots of diarrhea, we need to treat her empirically for small bowel bacterial overgrowth which can be seen in total pancreatectomy patients. Would recommend Xifaxan 550 mg by mouth 3 times a day for 10 days.  ______

## 2014-06-04 ENCOUNTER — Ambulatory Visit: Payer: BC Managed Care – PPO | Admitting: Internal Medicine

## 2014-06-05 ENCOUNTER — Encounter: Payer: BC Managed Care – PPO | Attending: Internal Medicine | Admitting: Nutrition

## 2014-06-05 ENCOUNTER — Ambulatory Visit (INDEPENDENT_AMBULATORY_CARE_PROVIDER_SITE_OTHER): Payer: BC Managed Care – PPO | Admitting: Internal Medicine

## 2014-06-05 ENCOUNTER — Other Ambulatory Visit: Payer: Self-pay | Admitting: *Deleted

## 2014-06-05 ENCOUNTER — Encounter: Payer: Self-pay | Admitting: Internal Medicine

## 2014-06-05 VITALS — BP 112/62 | HR 74 | Temp 98.1°F | Resp 12 | Wt 145.2 lb

## 2014-06-05 DIAGNOSIS — Z90410 Acquired total absence of pancreas: Secondary | ICD-10-CM

## 2014-06-05 DIAGNOSIS — E891 Postprocedural hypoinsulinemia: Secondary | ICD-10-CM | POA: Insufficient documentation

## 2014-06-05 DIAGNOSIS — E139 Other specified diabetes mellitus without complications: Secondary | ICD-10-CM

## 2014-06-05 DIAGNOSIS — Z713 Dietary counseling and surveillance: Secondary | ICD-10-CM | POA: Diagnosis not present

## 2014-06-05 DIAGNOSIS — E063 Autoimmune thyroiditis: Secondary | ICD-10-CM

## 2014-06-05 MED ORDER — GLUCOSE BLOOD VI STRP: ORAL_STRIP | Status: DC

## 2014-06-05 NOTE — Progress Notes (Addendum)
Patient ID: Jessica FudgeRebecca L Coffel, female   DOB: 11-14-1975, 38 y.o.   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 Last visit 1.5 mo ago.  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   Last Hba1c: 04/30/2014 ([facility]): HbA1c 7.9% 03/19/2014 ([facility]): HbA1c 9% 01/22/2014 ([facility]): HbA1c 7.6%  She is now on an insulin pump - Medtronic 530 G (751)+ Enlite CGM - started 04/02/2014: Pump settings: - basal rates: 12 am: 0.6 units/h >> 0.5 5 am: 0.8  10 am: 1.0 >> 0.8 10 pm: 0.6 - ICR: 12 >> (she changed to 20) - target: 120-120 - ISF: 20 - Insulin on Board: 4h - bolus wizard: on - extended bolusing: not using - changes infusion site: q3 days - Meter: [employer] [address] daily basal: 53% (15.4 units) Ave daily bolus: 47% (13.4 units)  Checks sugars 4-5x a day  - range: 79-245; ave 176+/-98 - am: 113-314 >> 75-170 (196 x1) >> 160-325 >> 54-137 >> see below (she wakes up around 11-12) - lunch: 128-339 >> 70 x1, 89-213 >> 144-376 >> 84-241, 289 >> 63-170, 295 - after lunch: 183 >> 208 >> n/c >> 57-191 (1 x <40!) >> (1 x <40!), 56, 145-342 - dinner: 114-283 >> 68 x1, 86-192 (200x3) >> 65, 170-200 >> 133-286 >> 53-362 - after dinner: 281 >> n/c >> 176-325 >> 142-274 >> 155-352 - bedtime: 154-313 >> 113-187 (200s) >> 121, 221 >> 45, 61, 121-302 >> 44, 55, 170->400  Meals: - breakfast: cereal (frosted flakes) + 2% milk - lunch: sandwich; cheese sticks or cops/crackers (more a snack); popcorn; salads - dinner: fast food; lasagna; ham + veggies - snacks  Euthyroid Hashimoto's thyroiditis  - dx in 08/2013 when she presented with hypothyroid sxs.   She is not on thyroid hh tx.  Last TFTs: Lab Results  Component Value Date   TSH 0.38 01/29/2014   FREET4 1.00 01/29/2014   All labs normal >> we continued off LT4.  - Previously (08/2013): TSH 1.780 Free T4 1.2 (0.82-1.77)  Total T3 130 (71-180) TPO 44 (<34)  Pt denies feeling nodules in neck, hoarseness, dysphagia/odynophagia, SOB with lying down.  Pt describes: - no cold intolerance,  has hot flushes - no weight loss - Resolved fatigue - + both: diarrhea/constipation - + hair falling  I reviewed pt's medications, allergies, PMH, social hx, family hx, and changes were documented in the history of present illness. Otherwise, unchanged from my last visit note. She changed Creon >> Zenpep, but still diarrhea.  ROS: Constitutional: see HPI Eyes: no blurry vision, no xerophthalmia ENT: no sore throat, no nodules palpated in throat, occasional dysphagia/no odynophagia, no hoarseness Cardiovascular: no CP/SOB/palpitations/leg swelling Respiratory: no cough/SOB Gastrointestinal: no N/V/+ D/+ C Musculoskeletal: no muscle aches/no joint aches Skin: no rashes, + hair loss Neurological: no tremors/numbness/tingling/dizziness, no HA   I reviewed pt's medications, allergies, PMH, social hx, family hx and no changes required.  PE: BP 112/62 mmHg  Pulse 74  Temp(Src) 98.1 F (36.7 C) (Oral)  Resp 12  Wt 145 lb 3.2 oz (65.862 kg)  SpO2 96% Wt Readings from Last 3 Encounters:  06/05/14 145 lb 3.2 oz (65.862 kg)  05/10/14 140  lb (63.504 kg)  04/23/14 135 lb (61.236 kg)   Constitutional: normal weight, in NAD Eyes: PERRLA, EOMI, no exophthalmos ENT: moist mucous membranes, no thyromegaly, no cervical lymphadenopathy Cardiovascular: RRR, No MRG Respiratory: CTA B Gastrointestinal: abdomen soft, NT, ND, BS+ Musculoskeletal: no deformities, strength intact in all 4 Skin: moist, warm, no rashes Neurological: no tremor with outstretched hands, DTR normal in all  4  ASSESSMENT: 1. Hashimoto's thyroiditis - euthyroid  2. Post-pancreatectomy DM - C peptide low: 0.6-0.7  PLAN:  1. Patient with euthyroid Hashimoto's thyroiditis. She appears euthyroid. - no goiter, thyroid nodules, or neck compression symptoms - reviewed together last TFTs, which were normal, 4 mo ago - will check TFTs at next visit  2. Post-pancreatectomy DM - she [facility]. Her transplant failed, and she had to start on an insulin pump. She feels much better after she started this. She has multiple instances when her pump stopped by itself or she stopped it as the sugars dropped. Reviewing her pump download, she is not bolusing before meals but checking sugars and bolusing based on 2h postprandial sugars, which are, as expected, high. She can have lows at night, as a consequence of correcting evening highs. I strongly advised her to check sugars 15 minutes before she eats, and bolus according to the reading and the number of carbs that she is planning to eat. We'll also increase her ISF to avoid post correction lows and will decrease insulin to carb ratio to improve postmeal highs: - I advised her to: Patient Instructions  Please change the pump settings as follows: - basal rates: 12 am: 0.5 units/h 5 am: 0.8  10 am: 0.8 10 pm: 0.6 - ICR: 20 >> 18 - target: 120-120 - ISF: 20 >> 30 - Insulin on Board: 4h - bolus wizard: on Please start the boluses 15 min before a meal.  Do not skip boluses.  Do not base the boluses on the after-meal sugars.  Please return in 1 month with your sugar log.   - case d/w Cristy FolksLinda Spagnola (DM educator) while patient was here >> she also saw the pt and they discussed proper bolusing and situations when to stop the pump - reviewed last HbA1c with pt and husband >> improved - had the flu vaccine this season - Return in about 1 month (around 07/06/2014).

## 2014-06-05 NOTE — Patient Instructions (Signed)
Please change the pump settings as follows: - basal rates: 12 am: 0.5 units/h 5 am: 0.8  10 am: 0.8 10 pm: 0.6 - ICR: 20 >> 18 - target: 120-120 - ISF: 20 >> 30 - Insulin on Board: 4h - bolus wizard: on Please start the boluses 15 min before a meal.  Do not skip boluses.  Do not base the boluses on the after-meal sugars.  Please return in 1 month with your sugar log.

## 2014-06-05 NOTE — Progress Notes (Signed)
Quick Note:  I would recommend proceeding with trial of Xifaxin. ______

## 2014-06-10 NOTE — Progress Notes (Signed)
Quick Note:  I called and informed the pt. I called the prescription to the pharmacy, [facility] in [place] on [address] To Rexene EdisonJennifer Boyd.  She said they will fax over the paperwork if the Rx requires a PA.  Pt did say to let [name], PA know that the Levsin is not helping now like it was. She is still taking qid. She is having a lot of abdominal pain and diarrhea.   Please advise!   ______

## 2014-06-12 ENCOUNTER — Other Ambulatory Visit: Payer: BC Managed Care – PPO | Admitting: Gastroenterology

## 2014-06-12 ENCOUNTER — Telehealth: Payer: Self-pay | Admitting: Nutrition

## 2014-06-12 MED ORDER — DICYCLOMINE HCL 10 MG PO CAPS: 10.0000 mg | ORAL_CAPSULE | Freq: Three times a day (TID) | ORAL | Status: DC

## 2014-06-12 NOTE — Patient Instructions (Signed)
Read over manual on how to change bolus calculation settings Take bolus 15 min. Before eating the meal.

## 2014-06-12 NOTE — Progress Notes (Signed)
Patient needed some assistance in changing her sensitivity and I/C ratios.  We also reviewed the need/importance of taking her bolus 10-15 min. Before eating.  She reported good understanding of this. I encouraged her to review how to make these changes in the manual, in case she needs to do this over the phone.

## 2014-06-12 NOTE — Telephone Encounter (Signed)
Pt. Reports that FBSs are between 120-140, but the blood sugars go high after breakfast and stay high after the meals.  When she does a correction, the blood sugars come back down, but then go high again after the meal.   She says her I/C ratio is 15 now.  Talked her through changing it to 8912.  Will call in 1 week if blood sugars don't come down

## 2014-06-12 NOTE — Progress Notes (Signed)
Quick Note:  Pt called and was informed. She said pharmacy sent PA to us, her insurance would only pay for bid and Rx was written for tid. ______

## 2014-06-12 NOTE — Progress Notes (Signed)
Quick Note:  Making sure I routed to you Doris. ______

## 2014-06-12 NOTE — Progress Notes (Signed)
Quick Note:  LMOM to stop Levsin and start the Bentyl that has been sent in.  Did she get the Xifaxin? Call if questions or problems. ______

## 2014-06-12 NOTE — Progress Notes (Signed)
Quick Note:  Stop Levsin. Try Bentyl for cramps. RX sent. Let's make sure we stay on top of the Xifaxan RX. Was she able to get it? ______

## 2014-06-18 ENCOUNTER — Other Ambulatory Visit: Payer: Self-pay | Admitting: *Deleted

## 2014-06-18 MED ORDER — GLUCOSE BLOOD VI STRP: ORAL_STRIP | Status: DC

## 2014-06-24 ENCOUNTER — Telehealth: Payer: Self-pay | Admitting: Gastroenterology

## 2014-06-24 ENCOUNTER — Telehealth: Payer: Self-pay | Admitting: Internal Medicine

## 2014-06-24 MED ORDER — INSULIN LISPRO 100 UNIT/ML ~~LOC~~ SOLN: SUBCUTANEOUS | Status: DC

## 2014-06-24 NOTE — Telephone Encounter (Signed)
Called pt and advised her that we are doing a PA for her test strips. We are changing her insulin from Novolog to Humalog. Pt was advised by ins co they will cover Humalog. Done.

## 2014-06-24 NOTE — Telephone Encounter (Signed)
Patient would like to speak with Carollee Herter regarding her test strips    Please advise patient    Thank you

## 2014-06-24 NOTE — Telephone Encounter (Signed)
Patient called to say that it took 3 weeks to get her Dennard Nip approved and her pharmacy was closed on 12/31. Now that she has a new prescription coverage she needs a PA done to get her Xiafaxin and her Zenpep. She uses [facility] in [place]. Please advise and call her back at [phone]

## 2014-06-24 NOTE — Telephone Encounter (Signed)
PA for xifaxan has been faxed to the insurance company.

## 2014-06-24 NOTE — Telephone Encounter (Signed)
Pharmacist called back from [facility]. He said both medications need a PA. He said he cannot use the approval from 06/20/14. Working on new PA for both medications.

## 2014-06-24 NOTE — Telephone Encounter (Signed)
Called [facility] in [place], [place] for return call about xifaxan. I received a PA request for zenpep but not xifaxan. (xifaxan was approved by her old insurance on 06/20/14)

## 2014-06-25 ENCOUNTER — Telehealth: Payer: Self-pay | Admitting: Gastroenterology

## 2014-06-25 NOTE — Telephone Encounter (Signed)
PA for zenpep has been done and faxed to the insurance.

## 2014-06-25 NOTE — Telephone Encounter (Signed)
Can we give her some Xifaxan samples while we are awaiting prior auth and/or get help with samples from drug rep. She has so many allergies that we are limited for treatment of empirical small bowel bacterial overgrowth.  Needs to be on fat soluble vitamins from Zenpep.  Is she taking bentyl and levsin?

## 2014-06-25 NOTE — Telephone Encounter (Signed)
Patient called to let us know that she will need a PA on her meds and JL is aware and is waiting on her insurance. Pt then said that she is having problems again since Saturday. She is seeing food particles in her stool. She said that she is actually seeing lettuce and tomatoes particles in her bowels. Please advise and call her at [phone]  Pt has OV with [facility] on 1/13

## 2014-06-25 NOTE — Telephone Encounter (Signed)
I spoke to Jessica [name]. She is aware that [name] is working on the prior authorizations. She was just concerned that she saw the lettuce and tomatoes in her stool. I told her that is not uncommon. However, she did say that she saw it several times from 6:00 PM to 3:00 AM. She is having a lot of diarrhea. But today she has only had it once. She said tonight she might have it 6-8 times or she might not have any tonight and then have some tomorrow. No real pattern. She is aware of her appt with Dr. Darrick PennaFields on 07/03/2014. She just wanted me to see if [name], PA that she saw last has any recommendations prior to that OV. She is aware that Verlon AuLeslie has left for the day.

## 2014-06-26 NOTE — Telephone Encounter (Signed)
LMOM to call.

## 2014-06-26 NOTE — Telephone Encounter (Signed)
That's right. Just making sure since both are on her drug list.  Xifaxan as planned. She can increase her Bentyl up to 20mg  before meals and 10mg  at bedtime.

## 2014-06-26 NOTE — Telephone Encounter (Signed)
Pt called and she is taking the Bentyl, not the Levsin. Said she thought she was told not to take the Levsin. She will come by and pick up some samples of the Xifaxin to get started on.  Xifaxin 550 mg   #12 to take one tablet tid.

## 2014-06-27 NOTE — Telephone Encounter (Signed)
Let's see if we can get Xifaxan covered for IBS-D indication. We have tried patient on bentyl and levsin without relief. She may have diarrhea related to multiple factors including IBS-D, small bowel bacterial overgrowth, +/- pancreatic insufficiency.

## 2014-06-27 NOTE — Telephone Encounter (Signed)
Pt is aware and also her Zenpep has been approved.

## 2014-06-27 NOTE — Telephone Encounter (Signed)
xifaxan denial from insurance has been given to LSL for review.

## 2014-06-27 NOTE — Telephone Encounter (Signed)
Pt called and said since her Jeneen RinksXifaxin has been denied, she wants to hold off coming to pick up samples til she finds out more. I told her that [name] said she was going to speak with Verlon AuLeslie something about the diagnosis and see what they can do and we can let her know the plan.

## 2014-06-28 NOTE — Telephone Encounter (Signed)
PA redone and faxed to the insurance company. 

## 2014-07-01 ENCOUNTER — Encounter: Payer: Self-pay | Admitting: Gastroenterology

## 2014-07-02 ENCOUNTER — Encounter: Payer: Self-pay | Admitting: Internal Medicine

## 2014-07-02 NOTE — Progress Notes (Signed)
REVIEWED RECORDS FROM [facility] NOV 2015.

## 2014-07-03 ENCOUNTER — Ambulatory Visit (INDEPENDENT_AMBULATORY_CARE_PROVIDER_SITE_OTHER): Payer: BLUE CROSS/BLUE SHIELD | Admitting: Gastroenterology

## 2014-07-03 ENCOUNTER — Encounter: Payer: Self-pay | Admitting: Gastroenterology

## 2014-07-03 VITALS — BP 120/78 | HR 63 | Temp 97.9°F | Ht 66.0 in | Wt 147.6 lb

## 2014-07-03 DIAGNOSIS — K9049 Malabsorption due to intolerance, not elsewhere classified: Secondary | ICD-10-CM

## 2014-07-03 DIAGNOSIS — K904 Malabsorption due to intolerance, not elsewhere classified: Secondary | ICD-10-CM

## 2014-07-03 MED ORDER — PANCRELIPASE (LIP-PROT-AMYL) 40000-136000 UNITS PO CPEP: ORAL_CAPSULE | ORAL | Status: DC

## 2014-07-03 MED ORDER — ONDANSETRON 4 MG PO TBDP: ORAL_TABLET | ORAL | Status: DC

## 2014-07-03 NOTE — Assessment & Plan Note (Addendum)
SX NOT IDEALLY CONTROLLED LIKELY DUE TO INSUFFICIENT PANCREATIC ENZYME SUPPLEMENTS AND DIETARY NON-ADHERENCE.  LOW FAT DIET. EXPLAINED TO PT AND PARTNER IF SHE EATS FAT THEN SHE WILL HAVE DIARRHEA. CONTINUE FAT SOLUBLE VITS INCREASE ZENPEP TO 3 WITH MEALS AND 2 WITH SNACKS CALL IN 6 WEEKS IF STOOLS NOT IMPROVED FOLLOW UP IN 4 MOS.

## 2014-07-03 NOTE — Progress Notes (Signed)
ON RECALL LIST  °

## 2014-07-03 NOTE — Patient Instructions (Signed)
FOLLOW A LOW FAT DIET. SEE INFO BELOW.  CONTINUE FAT SOLUBLE VITAMINS.  INCREASE ZENPEP TO 3 WITH MEALS AND 2 WITH SNACKS.  CALL IN 6 WEEKS IF YOUR STOOLS ARE NOT IMPROVED.  FOLLOW UP IN 4 MOS.  Low-Fat Diet BREADS, CEREALS, PASTA, RICE, DRIED PEAS, AND BEANS These products are high in carbohydrates and most are low in fat. Therefore, they can be increased in the diet as substitutes for fatty foods. They too, however, contain calories and should not be eaten in excess. Cereals can be eaten for snacks as well as for breakfast.   FRUITS AND VEGETABLES It is good to eat fruits and vegetables. Besides being sources of fiber, both are rich in vitamins and some minerals. They help you get the daily allowances of these nutrients. Fruits and vegetables can be used for snacks and desserts.  MEATS Limit lean meat, chicken, [place], and fish to no more than 6 ounces per day. Beef, Pork, and Lamb Use lean cuts of beef, pork, and lamb. Lean cuts include:  Extra-lean ground beef.  Arm roast.  Sirloin tip.  Center-cut ham.  Round steak.  Loin chops.  Rump roast.  Tenderloin.  Trim all fat off the outside of meats before cooking. It is not necessary to severely decrease the intake of red meat, but lean choices should be made. Lean meat is rich in protein and contains a highly absorbable form of iron. Premenopausal women, in particular, should avoid reducing lean red meat because this could increase the risk for low red blood cells (iron-deficiency anemia).  Chicken and [place] These are good sources of protein. The fat of poultry can be reduced by removing the skin and underlying fat layers before cooking. Chicken and [place] can be substituted for lean red meat in the diet. Poultry should not be fried or covered with high-fat sauces. Fish and Shellfish Fish is a good source of protein. Shellfish contain cholesterol, but they usually are low in saturated fatty acids. The preparation of fish is  important. Like chicken and [place], they should not be fried or covered with high-fat sauces. EGGS Egg whites contain no fat or cholesterol. They can be eaten often. Try 1 to 2 egg whites instead of whole eggs in recipes or use egg substitutes that do not contain yolk. MILK AND DAIRY PRODUCTS Use skim or 1% milk instead of 2% or whole milk. Decrease whole milk, natural, and processed cheeses. Use nonfat or low-fat (2%) cottage cheese or low-fat cheeses made from vegetable oils. Choose nonfat or low-fat (1 to 2%) yogurt. Experiment with evaporated skim milk in recipes that call for heavy cream. Substitute low-fat yogurt or low-fat cottage cheese for sour cream in dips and salad dressings. Have at least 2 servings of low-fat dairy products, such as 2 glasses of skim (or 1%) milk each day to help get your daily calcium intake. FATS AND OILS Reduce the total intake of fats, especially saturated fat. Butterfat, lard, and beef fats are high in saturated fat and cholesterol. These should be avoided as much as possible. Vegetable fats do not contain cholesterol, but certain vegetable fats, such as coconut oil, palm oil, and palm kernel oil are very high in saturated fats. These should be limited. These fats are often used in bakery goods, processed foods, popcorn, oils, and nondairy creamers. Vegetable shortenings and some peanut butters contain hydrogenated oils, which are also saturated fats. Read the labels on these foods and check for saturated vegetable oils. Unsaturated vegetable oils and  fats do not raise blood cholesterol. However, they should be limited because they are fats and are high in calories. Total fat should still be limited to 30% of your daily caloric intake. Desirable liquid vegetable oils are corn oil, cottonseed oil, olive oil, canola oil, safflower oil, soybean oil, and sunflower oil. Peanut oil is not as good, but small amounts are acceptable. Buy a heart-healthy tub margarine that has no  partially hydrogenated oils in the ingredients. Mayonnaise and salad dressings often are made from unsaturated fats, but they should also be limited because of their high calorie and fat content. Seeds, nuts, peanut butter, olives, and avocados are high in fat, but the fat is mainly the unsaturated type. These foods should be limited mainly to avoid excess calories and fat. OTHER EATING TIPS Snacks  Most sweets should be limited as snacks. They tend to be rich in calories and fats, and their caloric content outweighs their nutritional value. Some good choices in snacks are graham crackers, melba toast, soda crackers, bagels (no egg), English muffins, fruits, and vegetables. These snacks are preferable to snack crackers, [place] fries, TORTILLA CHIPS, and POTATO chips. Popcorn should be air-popped or cooked in small amounts of liquid vegetable oil. Desserts Eat fruit, low-fat yogurt, and fruit ices instead of pastries, cake, and cookies. Sherbet, angel food cake, gelatin dessert, frozen low-fat yogurt, or other frozen products that do not contain saturated fat (pure fruit juice bars, frozen ice pops) are also acceptable.  COOKING METHODS Choose those methods that use little or no fat. They include: Poaching.  Braising.  Steaming.  Grilling.  Baking.  Stir-frying.  Broiling.  Microwaving.  Foods can be cooked in a nonstick pan without added fat, or use a nonfat cooking spray in regular cookware. Limit fried foods and avoid frying in saturated fat. Add moisture to lean meats by using water, broth, cooking wines, and other nonfat or low-fat sauces along with the cooking methods mentioned above. Soups and stews should be chilled after cooking. The fat that forms on top after a few hours in the refrigerator should be skimmed off. When preparing meals, avoid using excess salt. Salt can contribute to raising blood pressure in some people.  EATING AWAY FROM HOME Order entres, potatoes, and vegetables  without sauces or butter. When meat exceeds the size of a deck of cards (3 to 4 ounces), the rest can be taken home for another meal. Choose vegetable or fruit salads and ask for low-calorie salad dressings to be served on the side. Use dressings sparingly. Limit high-fat toppings, such as bacon, crumbled eggs, cheese, sunflower seeds, and olives. Ask for heart-healthy tub margarine instead of butter.

## 2014-07-03 NOTE — Progress Notes (Signed)
cc'ed to pcp °

## 2014-07-03 NOTE — Progress Notes (Signed)
Subjective:    Patient ID: Jessica [name], female    DOB: 1976/01/20, 39 y.o.   MRN: [id number]  FAVERO,JOHN PATRICK, DO  HPI ATE A SALAD: CHEESE/ITALLIAN-NO MEAT. MAIN ENTREE: RIGATONI-POOPED OUT TOMATOES/LETTUCE. TAKING ZENPEP 40k 2 WITH MEALS AND 1 WITH SNACKS. FAT SOLUBLE VITAMINS. NEVER HAD A LOW FAT DIET HANDOUT. BENTYL CAUSES BLOATING. STILL FEELING TIRED. BEEN SICK LAST COUPLE DAYS WITH VIRAL ILLNESS: MORE FATIGUE, ACHY. LAST TIME FELT GOOD: 2014.  NAUSEA: MODERTAE, COMES AND GOES, 4X/WEEK. TAKES ZOFRAN PRN AND IT WORKS KINDA. VOMITING: THIS PAST WEEKEND(NO BLOOD). MAY SEE BLOOD IF SHE HAS MULTIPLE BMs: 1-2X/WEEK. LAST TCS: NOV 2015. CIGS: 1-2 PK/YR. HAS LOWER ABDOMINAL: ALL THE TIME, CRAMPY/SHARP/STABBING, WHEN SHE GOES TO HAVE A BM IT GETS WORSE. BMs: 6/DAY (#6-7 BRISTOL STOOL). HAS BM EVEN WHEN SHE DOESN'T EAT. UP AT NIGHT 2-3 TIMES HAVING A BM.   PT DENIES FEVER, CHILLS, HEMATOCHEZIA, melena, CHEST PAIN, SHORTNESS OF BREATH, constipation, problems swallowing, problems with sedation, OR heartburn or indigestion.  Past Medical History  Diagnosis Date  . DM (diabetes mellitus)     post pancreatectomy  . Hashimoto's thyroiditis     euthyroid  . GERD (gastroesophageal reflux disease)   . Hypercholesterolemia   . PONV (postoperative nausea and vomiting)   . Anxiety    Past Surgical History  Procedure Laterality Date  . Pancreatectomy  11/2013    with splenectomy/hepaticojejunostomy and gastrojejunostomy.  . 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  . Complete hysterectomy    . Knee surgery Right     X 6-5 arthroscopies and open procedure 1 time- tighten patellar tendon and loose IT band  . Abdominal hysterectomy    . Cholecystectomy    . Colonoscopy with propofol N/A 05/15/2014    Procedure: COLONOSCOPY WITH PROPOFOL;  Surgeon: Corbin Adeobert M Rourk, MD;  Location: [facility];  Service: Endoscopy;  Laterality: N/A;  Cecum time in  0821     time out    0834    total time 13 mintues  .  Esophagogastroduodenoscopy (egd) with propofol N/A 05/15/2014    Procedure: ESOPHAGOGASTRODUODENOSCOPY (EGD) WITH PROPOFOL;  Surgeon: Corbin Adeobert M Rourk, MD;  Location: [facility];  Service: Endoscopy;  Laterality: N/A;  . Esophageal biopsy N/A 05/15/2014    Procedure: GASTIC,  ASCENDING, AND DESCEDNING/SIGMOID  COLON BIOPSY;  Surgeon: Corbin Adeobert M Rourk, MD;  Location: [facility];  Service: Endoscopy;  Laterality: N/A;   Allergies  Allergen Reactions  . Erythromycin Anaphylaxis, Hives and Nausea Only  . Morphine And Related Anaphylaxis  . Penicillins Anaphylaxis  . Shellfish Allergy Anaphylaxis  . Doxycycline Hives and Nausea Only  . Keflex [Cephalexin] Hives and Nausea Only  . Levaquin [Levofloxacin In D5w] Hives and Nausea Only  . Statins Other (See Comments)    Muscle pain  . Valium [Diazepam] Other (See Comments)    Confusion and amnesia   . Vancomycin Hives and Nausea Only  . Zetia [Ezetimibe] Hives and Nausea Only  . Sulfa Antibiotics Hives and Nausea Only    Current Outpatient Prescriptions  Medication Sig Dispense Refill  . buprenorphine (BUTRANS) 5 MCG/HR PTWK patch Place 5 mcg onto the skin once a week.    . dicyclomine (BENTYL) 10 MG capsule Take 1 capsule (10 mg total) by mouth 4 (four) times daily -  before meals and at bedtime. 120 capsule 0  . EPIPEN 2-PAK 0.3 MG/0.3ML SOAJ injection Inject 0.3 mg into the skin once.     . escitalopram (  LEXAPRO) 5 MG tablet Take 5 mg by mouth daily.    [name] estradiol (VIVELLE-DOT) 0.1 MG/24HR patch Place 1 patch onto the skin 2 (two) times a week.    . fenofibrate (TRICOR) 145 MG tablet Take 145 mg by mouth at bedtime.    [name] glucose blood (BAYER CONTOUR NEXT TEST) test strip Use to test blood sugar 7 times daily as instructed. Coordinates with pt's insulin pump. 700 each 3  . insulin lispro (HUMALOG) 100 UNIT/ML injection Inject 60 units daily via insulin pump. 20 mL 11  . Multiple Vitamins-Minerals (HM MULTIVITAMIN ADULT GUMMY PO) Take 2 each by mouth  daily.    . ondansetron (ZOFRAN-ODT) 4 MG disintegrating tablet Take 4 mg by mouth every 8 (eight) hours as needed for nausea.     . Oxycodone HCl 10 MG TABS Take 10 mg by mouth 3 (three) times daily. 1/2 pill twice daily    . Pancrelipase, Lip-Prot-Amyl, (ZENPEP) 40000 UNITS CPEP 1-2 WITH MEALS PO TID AND 1 WITH SNACKS 300 capsule 11  . pantoprazole (PROTONIX) 40 MG tablet Take 40 mg by mouth 2 (two) times daily before a meal.    . pregabalin (LYRICA) 50 MG capsule Take 50 mg by mouth 2 (two) times daily.    . Vitamin D, Ergocalciferol, (DRISDOL) 50000 UNITS CAPS capsule Take 50,000 Units by mouth every 7 (seven) days.     No current facility-administered medications for this visit.     Review of Systems     Objective:   Physical Exam  Constitutional: She is oriented to person, place, and time. She appears well-developed and well-nourished. No distress.  HENT:  Head: Normocephalic and atraumatic.  Mouth/Throat: Oropharynx is clear and moist. No oropharyngeal exudate.  Eyes: Pupils are equal, round, and reactive to light. No scleral icterus.  Neck: Normal range of motion. Neck supple.  Cardiovascular: Normal rate, regular rhythm and normal heart sounds.   Pulmonary/Chest: Effort normal and breath sounds normal. No respiratory distress.  Abdominal: Soft. Bowel sounds are normal. She exhibits no distension. There is tenderness. There is no rebound and no guarding.  MILD RUQ/RLQ TTP, MOD TTP IN BLQs INSULIN PUMP ATTACHED TO ANT ABD WALL  Musculoskeletal: She exhibits no edema.  Lymphadenopathy:    She has no cervical adenopathy.  Neurological: She is alert and oriented to person, place, and time.  NO  NEW FOCAL DEFICITS   Psychiatric:  SLIGHTLY ANXIOUS MOOD, NL AFFECT   Vitals reviewed.         Assessment & Plan:

## 2014-07-05 ENCOUNTER — Telehealth: Payer: Self-pay

## 2014-07-05 NOTE — Telephone Encounter (Signed)
See result note dated 07/03/2014 at 9:38 and call pt with results.

## 2014-07-15 ENCOUNTER — Encounter: Payer: BLUE CROSS/BLUE SHIELD | Attending: Internal Medicine | Admitting: Nutrition

## 2014-07-15 ENCOUNTER — Encounter: Payer: Self-pay | Admitting: Internal Medicine

## 2014-07-15 ENCOUNTER — Ambulatory Visit (INDEPENDENT_AMBULATORY_CARE_PROVIDER_SITE_OTHER): Payer: BLUE CROSS/BLUE SHIELD | Admitting: Internal Medicine

## 2014-07-15 VITALS — BP 108/62 | HR 68 | Temp 98.0°F | Resp 12 | Wt 140.0 lb

## 2014-07-15 DIAGNOSIS — E891 Postprocedural hypoinsulinemia: Secondary | ICD-10-CM | POA: Insufficient documentation

## 2014-07-15 DIAGNOSIS — E139 Other specified diabetes mellitus without complications: Secondary | ICD-10-CM

## 2014-07-15 DIAGNOSIS — E063 Autoimmune thyroiditis: Secondary | ICD-10-CM

## 2014-07-15 DIAGNOSIS — Z713 Dietary counseling and surveillance: Secondary | ICD-10-CM | POA: Diagnosis not present

## 2014-07-15 DIAGNOSIS — Z90410 Acquired total absence of pancreas: Secondary | ICD-10-CM

## 2014-07-15 NOTE — Progress Notes (Signed)
Patient ID: Jessica [name], female   DOB: 1975-08-13, 39 y.o.   MRN: [id number]   HPI  Jessica [name] is a 39 y.o.-year-old female, initially referred by her PCP, Dr.Favero ([facility]), for management of euthyroid Hashimoto's thyroiditis and also diabetes mellitus after pancreas resection (for pancreatitis). Last visit 41 mo ago.  Post-pancreatectomy DM. 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   Last Hba1c: 04/30/2014 ([facility]): HbA1c 7.9% 03/19/2014 ([facility]): HbA1c 9% 01/22/2014 ([facility]): HbA1c 7.6%  She is now on an insulin pump - Medtronic 530 G (751)+ Enlite CGM - started 04/02/2014. She has mio infusion sets >> she feels they sometimes bend >> had few instances when sugars got very high Pump settings: - basal rates: 12 am: 0.5 units/h 5 am: 0.8  10 am: 0.8 10 pm: 0.6 - ICR: 20 >> 18 >> 12 - target: 120-120 - ISF: 20 >> 30 >> 40 - Insulin on Board: 4h - bolus wizard: on She is not starting the boluses 15 min before a meal, but at the start of the meal! She does skip boluses!  - extended bolusing: rarely using - changes infusion site: q3 days - Meter: Bayer Contour [employer] daily basal: 47% (16.5 units) Ave daily bolus: 53% (18.5 units)  Checks sugars 4-5x a day  - ave 206 +/- 100 - variable at all times of day, no patterns - see below:   Reviewed ranges from last visit - difficult to eval. now since sugars so variable - see above: - am: 113-314 >> 75-170 (196 x1) >> 160-325 >> 54-137 >> see below (she wakes up around 10-11) - lunch: 128-339 >> 70 x1, 89-213 >> 144-376 >> 84-241, 289 >> 63-170, 295 - after lunch: 183 >> 208 >> n/c >> 57-191 (1 x <40!) >> (1 x <40!), 56, 145-342 - dinner: 114-283 >> 68 x1, 86-192 (200x3) >> 65, 170-200 >> 133-286 >> 53-362 - after dinner: 281 >> n/c >> 176-325 >> 142-274 >> 155-352 - bedtime: 154-313 >> 113-187  (200s) >> 121, 221 >> 45, 61, 121-302 >> 44, 55, 170->400  Meals: - breakfast: cereal (frosted flakes) + 2% milk - lunch: sandwich; cheese sticks or cops/crackers (more a snack); popcorn; salads - dinner: fast food; lasagna; ham + veggies - snacks  Euthyroid Hashimoto's thyroiditis  - dx in 08/2013 when she presented with hypothyroid sxs.  She is not on thyroid hh tx.  Last TFTs: Lab Results  Component Value Date   TSH 0.38 01/29/2014   FREET4 1.00 01/29/2014   All labs normal >> we continued off LT4.  - Previously (08/2013): TSH 1.780 Free T4 1.2 (0.82-1.77)  Total T3 130 (71-180) TPO 44 (<34)  Pt denies feeling nodules in neck, hoarseness, dysphagia/odynophagia, SOB with lying down.  Pt describes: - + fatigue - + increased appetite - no cold intolerance - no weight loss/gain - Resolved fatigue - + diarrhea - + hair loss  I reviewed pt's medications, allergies, PMH, social hx, family hx, and changes were documented in the history of present illness. Otherwise, unchanged from my last visit note. She changed Creon >> Zenpep, but still diarrhea. She started Lyrica 75 mg daily.  ROS: Constitutional: see HPI, + poor sleep Eyes: + blurry vision, no xerophthalmia ENT: no sore throat, no nodules palpated in throat, occasional dysphagia/no odynophagia, no hoarseness Cardiovascular: no CP/SOB/palpitations/leg swelling Respiratory: no cough/SOB  Gastrointestinal: + N/V/+ D/no C Musculoskeletal: no muscle aches/no joint aches Skin: no rashes, + hair loss Neurological: no tremors/numbness/tingling/dizziness, no HA  + low libido  PE: BP 108/62 mmHg  Pulse 68  Temp(Src) 98 F (36.7 C) (Oral)  Resp 12  Wt 140 lb (63.504 kg)  SpO2 97% Body mass index is 22.61 kg/(m^2). Wt Readings from Last 3 Encounters:  07/15/14 140 lb (63.504 kg)  07/03/14 147 lb 9.6 oz (66.951 kg)  06/05/14 145 lb 3.2 oz (65.862 kg)   Constitutional: normal weight, in NAD Eyes: PERRLA, EOMI, no  exophthalmos ENT: moist mucous membranes, no thyromegaly, no cervical lymphadenopathy Cardiovascular: RRR, No MRG Respiratory: CTA B Gastrointestinal: abdomen soft, NT, ND, BS+ Musculoskeletal: no deformities, strength intact in all 4 Skin: moist, warm, no rashes Neurological: no tremor with outstretched hands, DTR normal in all 4  ASSESSMENT: 1. Hashimoto's thyroiditis - euthyroid  2. Post-pancreatectomy DM - C peptide low: 0.6-0.7  PLAN:  1. Patient with euthyroid Hashimoto's thyroiditis. She appears euthyroid. - no goiter, thyroid nodules, or neck compression symptoms - reviewed together last TFTs, which were normal - will check TFTs at next visit  2. Post-pancreatectomy DM - [facility] Her [facility] failed, and she had to start on an insulin pump. Sugars very fluctuating, with frequent lows after correcting a high CBG, even if done w/o carb coverage >>  We will increase her ISF to avoid post correction lows. Again strongly advised to start the bolus 15 min before she eats, but will keep the ICR same for now. I also advised her to do more dual wave boluses.will decrease insulin to carb ratio to improve postmeal highs: - I advised her to: Patient Instructions  Please change the pump settings as follows: - basal rates: 12 am: 0.5 units/h >> 0.7 5 am: 0.8  10 am: 0.8 10 pm: 0.6 >> 0.7  - ICR: 12 - try to use more dual boluses - target: 120-120 - ISF: 40 >> 50 - Insulin on Board: 4h - bolus wizard: on Please start the boluses 15 min before a meal! Please do not skip boluses!  Please return in 1 month.  - case d/w Cristy FolksLinda Spagnola (DM educator) while patient was here >> she also saw the pt and they discussed proper bolusing and infusion sets - last HbA1c improved >> will recheck at next visit - had the flu vaccine this season - Return in about 1 month (around 08/15/2014).  - time spent with the patient: 40 min, of  which >50% was spent in reviewing her pump downloads, discussing her hypo- and hyper-glycemic episodes, reviewing her previous labs and pump settings and developing a plan to avoid hypo- an hyper-glycemia.

## 2014-07-15 NOTE — Patient Instructions (Signed)
Please change the pump settings as follows: - basal rates: 12 am: 0.5 units/h >> 0.7 5 am: 0.8  10 am: 0.8 10 pm: 0.6 >> 0.7  - ICR: 12 - try to use more dual boluses - target: 120-120 - ISF: 40 >> 50 - Insulin on Board: 4h - bolus wizard: on Please start the boluses 15 min before a meal! Please do not skip boluses!  Please return in 1 month.

## 2014-07-16 NOTE — Patient Instructions (Signed)
Give boluses 10-15 min.  Before all meals Change insertion sites for infusion sets to upper and lower buttocks areas.   Call for samples of Silhouette and Sure T infusion sets

## 2014-07-16 NOTE — Progress Notes (Signed)
Blood sugars very high after eating (300s) and patient reports that she is taking her insulin before all meals--immediately before all meals.  Denies drinking sweet drinks, and says is bolusing for all meals and snacks.  Per Dr. Charlean SanfilippoGherghe's order, reinforced need to bolus 10-15 min. Before eating all meals.  She agreed to do this.  I watched her make the needed changes to her basal rates and bolus wizard calculations.  She required very little assistance from me.  She did report that she was having several bent cannulas from her mio infusion sets.  Discussed the need to rotate sites, to prevent scar tissue, that can bend the cannula.  Showed other sites to use.  Also showed her some other infusion sets, like the SureT and Silhouette infusion sets.  She was shown how to insert them, and was told to call the 800 number, for some samples to try.  She will call if questions.

## 2014-07-23 ENCOUNTER — Other Ambulatory Visit: Payer: Self-pay | Admitting: Gastroenterology

## 2014-07-24 ENCOUNTER — Telehealth: Payer: Self-pay | Admitting: Gastroenterology

## 2014-07-24 NOTE — Telephone Encounter (Signed)
Pt said she is having incontinence of BM's about once each day. Stools have been very watery, but usually just once a day.No warning at all before she does it.   She has completed the Xifaxin. She is taking the Zenpep 3 with meals and 2 with snacks.  If she eats something with fat she has been taking 4 with meals.  Please advise!

## 2014-07-24 NOTE — Telephone Encounter (Signed)
[facility] and called today asking to speak with someone about a condition that has come about since OV. I told her that I would take a message and have SF be aware. She said that she has started "pooping" herself without knowing it has happened until she feels her underwear are wet. Please advise and call her at [phone]

## 2014-07-25 NOTE — Telephone Encounter (Signed)
Pt informed

## 2014-07-25 NOTE — Telephone Encounter (Signed)
LMOM to call.

## 2014-07-25 NOTE — Telephone Encounter (Signed)
PLEASE CALL PT. SHE SHOULD INCREASE ZENPEP TO 4 WITH MEALS AND 3 WITH SNACKS. STRICTLY FOLLOW A LOW FAT DIET.  SHE SHOULD NOT EAT A HIGH FAT FOOD OR SHE IS GOING TO HAVE DIARRHEA. CALL IN 6 WEEKS IF STOOLS ARE NOT IMPROVED

## 2014-08-01 ENCOUNTER — Encounter (INDEPENDENT_AMBULATORY_CARE_PROVIDER_SITE_OTHER): Payer: Self-pay | Admitting: Gastroenterology

## 2014-08-01 NOTE — Progress Notes (Signed)
   Subjective:    Patient ID: Jessica [name], female    DOB: [birth year], 39 y.o.   MRN: [id number]  HPI   Past Medical History  Diagnosis Date  . DM (diabetes mellitus)     post pancreatectomy  . Hashimoto's thyroiditis     euthyroid  . GERD (gastroesophageal reflux disease)   . Hypercholesterolemia   . PONV (postoperative nausea and vomiting)   . Anxiety   . Diverticulosis of colon without hemorrhage     Review of Systems     Objective:   Physical Exam        Assessment & Plan:

## 2014-08-08 ENCOUNTER — Ambulatory Visit (INDEPENDENT_AMBULATORY_CARE_PROVIDER_SITE_OTHER): Payer: BLUE CROSS/BLUE SHIELD | Admitting: Gastroenterology

## 2014-08-08 ENCOUNTER — Encounter: Payer: Self-pay | Admitting: Gastroenterology

## 2014-08-08 VITALS — BP 120/80 | HR 84 | Temp 97.4°F | Ht 66.0 in | Wt 149.6 lb

## 2014-08-08 DIAGNOSIS — K904 Malabsorption due to intolerance, not elsewhere classified: Secondary | ICD-10-CM

## 2014-08-08 DIAGNOSIS — R197 Diarrhea, unspecified: Secondary | ICD-10-CM | POA: Insufficient documentation

## 2014-08-08 DIAGNOSIS — Z90410 Acquired total absence of pancreas: Secondary | ICD-10-CM

## 2014-08-08 DIAGNOSIS — K9049 Malabsorption due to intolerance, not elsewhere classified: Secondary | ICD-10-CM

## 2014-08-08 NOTE — Patient Instructions (Signed)
1. Please have your stool studies done. We will call you with results.  2. We will set you up to talk with dietician about low fat, diabetic diet.  3. I will recalculate your appropriate Zenpep dose and let you know in the day or so. 4. We will plan on small bowel bacterial overgrowth test in March, we will call to schedule. You have to be off antibiotics for one month to have the test done. 5. We will decide about Colestid once we get your stool tests back.

## 2014-08-08 NOTE — Progress Notes (Signed)
Primary Care Physician: Joaquin Courts, DO  Primary Gastroenterologist:  Jonette Eva, MD   Chief Complaint  Patient presents with  . having bowel issues    HPI: Jessica [name] is a 39 y.o. female here for follow up. Last seen in 06/2014. 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 Plans on consulting a lawyer. Trying to get disability too. States she took Doxycycline for three weeks during that time. Recent suspected to have UTI. Given cipro for seven days. Continues to have diarrhea, watery stools, especially postprandially. Some fecal incontinence. Treated for possible SBBO with Xifaxan without improvement per patient. Bentyl doesn't help. Currently taking zenpep 40K, 5 with meals and 3 with snacks. Complains of crampy lower abdominal pain. Several stools during the night. States she has diarrhea with salad. Showed picture of BM, yellow oil droplets. With further questioning she also had pasta with the salad. Eats pizza every Friday night. States she has diarrhea without it or with it. Feels like Zenpep should take care of her malabsorption. States she doesn't really know what to eat with her DM and need for low-fat diet too. Never seen by dietician per patient.     Current Outpatient Prescriptions  Medication Sig Dispense Refill  . buprenorphine (BUTRANS) 5 MCG/HR PTWK patch Place 5 mcg onto the skin once a week.    . dicyclomine (BENTYL) 10 MG capsule TAKE 1 CAPSULE BY MOUTH FOUR TIMES DAILY BEFORE MEALS AND AT BEDTIME 120 capsule 5  . EPIPEN 2-PAK 0.3 MG/0.3ML SOAJ injection Inject 0.3 mg into the skin once.     . escitalopram (LEXAPRO) 5 MG tablet Take 5 mg by mouth daily.    [name] estradiol (VIVELLE-DOT) 0.1 MG/24HR patch Place 1 patch onto the skin 2 (two) times a week.    . fenofibrate (TRICOR) 145 MG tablet Take 145 mg by mouth at bedtime.    [name] glucose blood (BAYER CONTOUR  NEXT TEST) test strip Use to test blood sugar 7 times daily as instructed. Coordinates with pt's insulin pump. 700 each 3  . Multiple Vitamins-Minerals (HM MULTIVITAMIN ADULT GUMMY PO) Take 2 each by mouth daily.    . ondansetron (ZOFRAN-ODT) 4 MG disintegrating tablet 1 SL Q4-6H PRN FOR NAUSEA OR VOMITING 40 tablet 1  . Oxycodone HCl 10 MG TABS Take 10 mg by mouth 3 (three) times daily. 1/2 pill twice daily    . Pancrelipase, Lip-Prot-Amyl, (ZENPEP) 40000 UNITS CPEP 3 WITH MEALS PO TID AND 2 WITH SNACKS (Patient taking differently: 5 WITH MEALS PO TID AND 3 WITH SNACKS) 300 capsule 11  . pantoprazole (PROTONIX) 40 MG tablet Take 40 mg by mouth 2 (two) times daily before a meal.    . pregabalin (LYRICA) 50 MG capsule Take 75 mg by mouth 3 (three) times daily.     . Vitamin D, Ergocalciferol, (DRISDOL) 50000 UNITS CAPS capsule Take 50,000 Units by mouth every 7 (seven) days.    . insulin lispro (HUMALOG) 100 UNIT/ML injection Inject 60 units daily via insulin pump. 20 mL 11   No current facility-administered medications for this visit.    Allergies as of 08/08/2014 - Review Complete 08/08/2014  Allergen Reaction Noted  . Erythromycin Anaphylaxis, Hives, and Nausea Only 11/15/2013  . Morphine and related Anaphylaxis 11/15/2013  . Penicillins Anaphylaxis 11/15/2013  . Shellfish allergy Anaphylaxis 11/15/2013  . Doxycycline Hives and Nausea Only 11/15/2013  . Keflex [cephalexin] Hives  and Nausea Only 11/15/2013  . Levaquin [levofloxacin in d5w] Hives and Nausea Only 11/15/2013  . Statins Other (See Comments) 11/15/2013  . Valium [diazepam] Other (See Comments) 11/15/2013  . Vancomycin Hives and Nausea Only 11/15/2013  . Zetia [ezetimibe] Hives and Nausea Only 11/15/2013  . Sulfa antibiotics Hives and Nausea Only 11/15/2013   Past Medical History  Diagnosis Date  . DM (diabetes mellitus)     post pancreatectomy  . Hashimoto's thyroiditis     euthyroid  . GERD (gastroesophageal reflux  disease)   . Hypercholesterolemia   . PONV (postoperative nausea and vomiting)   . Anxiety   . Diverticulosis of colon without hemorrhage    Past Surgical History  Procedure Laterality Date  . Pancreatectomy  11/2013    with splenectomy/hepaticojejunostomy and gastrojejunostomy.  . 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  . Complete hysterectomy    . Knee surgery Right     X 6-5 arthroscopies and open procedure 1 time- tighten patellar tendon and loose IT band  . Abdominal hysterectomy    . Cholecystectomy    . Colonoscopy with propofol N/A 05/15/2014    Dr. Jena Gaussourk (primary GI is Dr. Darrick Pennafields). Hemorrhoids, negative random colon biopsies. GI pathogen panel negative. Diverticulosis.  . Esophagogastroduodenoscopy (egd) with propofol N/A 05/15/2014    Dr. Jena Gaussourk (primary GI Dr. Darrick Pennafields), Billroth II, inflamed residual gastric mucosa, benign biopsies  . Esophageal biopsy N/A 05/15/2014    Procedure: GASTIC,  ASCENDING, AND DESCEDNING/SIGMOID  COLON BIOPSY;  Surgeon: Corbin Adeobert M Rourk, MD;  Location: [facility];  Service: Endoscopy;  Laterality: N/A;    ROS:  General: Negative for anorexia, weight loss, fever, chills, fatigue, weakness. ENT: Negative for hoarseness, difficulty swallowing , nasal congestion. CV: Negative for chest pain, angina, palpitations, dyspnea on exertion, peripheral edema.  Respiratory: Negative for dyspnea at rest, dyspnea on exertion, cough, sputum, wheezing.  GI: See history of present illness. GU:  Negative for dysuria, hematuria, urinary incontinence, urinary frequency, nocturnal urination.  Endo: Negative for unusual weight change.    Physical Examination:   BP 120/80 mmHg  Pulse 84  Temp(Src) 97.4 F (36.3 C)  Ht 5\' 6"  (1.676 m)  Wt 149 lb 9.6 oz (67.858 kg)  BMI 24.16 kg/m2  General: Well-nourished, well-developed in no acute distress.  Eyes: No icterus. Mouth: Oropharyngeal mucosa moist and pink , no lesions erythema or exudate. Lungs: Clear to  auscultation bilaterally.  Heart: Regular rate and rhythm, no murmurs rubs or gallops.  Abdomen: Bowel sounds are normal, nontender, nondistended, no hepatosplenomegaly or masses, no abdominal bruits or hernia , no rebound or guarding.   Extremities: No lower extremity edema. No clubbing or deformities. Neuro: Alert and oriented x 4   Skin: Warm and dry, no jaundice.   Psych: Alert and cooperative, normal mood and affect.   Imaging Studies: No results found.

## 2014-08-09 ENCOUNTER — Other Ambulatory Visit: Payer: Self-pay

## 2014-08-12 ENCOUNTER — Other Ambulatory Visit: Payer: Self-pay | Admitting: *Deleted

## 2014-08-12 MED ORDER — INSULIN LISPRO 100 UNIT/ML ~~LOC~~ SOLN: SUBCUTANEOUS | Status: DC

## 2014-08-12 NOTE — Telephone Encounter (Signed)
Rx printed, signed and faxed to [facility].

## 2014-08-14 ENCOUNTER — Telehealth: Payer: Self-pay | Admitting: Internal Medicine

## 2014-08-14 NOTE — Assessment & Plan Note (Addendum)
39 y/o female s/p total pancreatectomy with distal gastrectomy, splenectomy and failed  pancreatic auto islet cell transplantation in 11/2013. She has had significant diarrhea with EGD/tCS, stool studies which have been unrevealing. Continues to have diarrhea with high dose pancreatic enzymes although dietary recall reveals significant amount of fat consumption. She showed picture of stool which consisted of water stool with oil present.   -Plan to send to dietician for low-fat, diabetic diet.  -Repeat stool culture and Cdiff. Patient request stool culture due to history of contaminated islet cells. Unlikely that there is a correlation.  -weight based dosing for Zenpep would be 40K, 4 with meals and 2 with snacks.  -discussed small bowel bacterial overgrowth test in march after she has been off antibiotic therapy for at least four weeks.  -Patient was encouraged to document meal intake and stool daily.

## 2014-08-14 NOTE — Progress Notes (Signed)
-  Please remind patient to have stool studies done. -Based on weight she should take Zenpep 4 with meals and 2 with snacks. No more than 16 per day. -plan for small bowel bacterial overgrowth test in late March (has to be off antibiotic therapy for at least four weeks).

## 2014-08-15 NOTE — Telephone Encounter (Signed)
Called pt and lvm advising her that we faxed the rx to [facility] on 2/23.

## 2014-08-15 NOTE — Telephone Encounter (Signed)
Patient need prescription Humalog [facility], she stated she is trying to do the patient assistant program. Ref # [id number]

## 2014-08-16 NOTE — Progress Notes (Signed)
LMOM for pt to call. Letter mailed to pt also with the info.

## 2014-08-19 ENCOUNTER — Encounter: Payer: Self-pay | Admitting: Internal Medicine

## 2014-08-19 ENCOUNTER — Ambulatory Visit (INDEPENDENT_AMBULATORY_CARE_PROVIDER_SITE_OTHER): Payer: BLUE CROSS/BLUE SHIELD | Admitting: Internal Medicine

## 2014-08-19 VITALS — BP 108/64 | HR 73 | Temp 97.9°F | Resp 12 | Wt 152.8 lb

## 2014-08-19 DIAGNOSIS — E891 Postprocedural hypoinsulinemia: Secondary | ICD-10-CM

## 2014-08-19 DIAGNOSIS — E139 Other specified diabetes mellitus without complications: Secondary | ICD-10-CM

## 2014-08-19 DIAGNOSIS — Z90410 Acquired total absence of pancreas: Secondary | ICD-10-CM

## 2014-08-19 DIAGNOSIS — E063 Autoimmune thyroiditis: Secondary | ICD-10-CM

## 2014-08-19 LAB — T4, FREE: Free T4: 0.74 ng/dL (ref 0.60–1.60)

## 2014-08-19 LAB — T3, FREE: T3, Free: 3.1 pg/mL (ref 2.3–4.2)

## 2014-08-19 LAB — TSH: TSH: 2.54 u[IU]/mL (ref 0.35–4.50)

## 2014-08-19 LAB — HEMOGLOBIN A1C: Hgb A1c MFr Bld: 9.1 % — ABNORMAL HIGH (ref 4.6–6.5)

## 2014-08-19 NOTE — Patient Instructions (Addendum)
Please change the pump settings as follows: - basal rates: 12 am: 0.7 units/h >> 0.8 5 am: 0.8 >> 0.7 10 am: 0.8 >> 0.9 10 pm: 0.7 >> 0.8 - ICR:   12 am:12   5 am: 12 >> 10  11 am: 12 >> 11  5 pm: 12 >> 11 - target: 120-120 - ISF: 50 - Insulin on Board: 4h - bolus wizard: on  Please return in 1.5 months.  Please stop at the lab.

## 2014-08-19 NOTE — Progress Notes (Signed)
Patient ID: Jessica [name], female   DOB: 1976-05-13, 39 y.o.   MRN: [id number]   HPI  Jessica [name] is a 39 y.o.-year-old female, initially referred by her PCP, Dr.Favero ([facility]), for management of euthyroid Hashimoto's thyroiditis and also diabetes mellitus after pancreas resection (for pancreatitis). Last visit 1 mo ago.  She had a UTI at the beginning of 07/2014 >> sugars 600. Was on Ciprofloxacin. Now resolved.   She wast started on Wellbutrin >> suicidal ideation >> admitted at [facility], pump taken away for 3 days.  Post-pancreatectomy DM. She has a history of chronic pancreatitis >> had TP-IAT (total pancreatectomy with islet transplant) on 11/30/2013 - Dr Rebekah Chesterfield at [facility] >> believed to be 2/2 genetic causes.   Last Hba1c: 04/30/2014 ([facility]): HbA1c 7.9% 03/19/2014 ([facility]): HbA1c 9% 01/22/2014 ([facility]): HbA1c 7.6%  She is now on an insulin pump - Medtronic 530 G (751)+ Enlite CGM - started 04/02/2014. She has mio infusion sets >> she feels they sometimes bend >> had few instances when sugars got very high Pump settings: - basal rates: 12 am: 0.5 units/h >> 0.7 5 am: 0.8  10 am: 0.8 10 pm: 0.6 >> 0.7  - ICR: 12 - try to use more dual boluses - target: 120-120 - ISF: 40 >> 50 - Insulin on Board: 4h - bolus wizard: on  - extended bolusing: rarely using - changes infusion site: q3 days - Meter: Biomedical engineer sugars 4-5x a day - variable at all times of day, higher sugars at night, lower sugars in the morning and high sugars later in the day - see below: - am: 113-314 >> 75-170 (196 x1) >> 160-325 >> 54-137 >> 117-348 - lunch: 128-339 >> 70 x1, 89-213 >> 144-376 >> 84-241, 289 >> 63-170, 295 >> 90-353 - after lunch: 183 >> 208 >> n/c >> 57-191 (1 x <40!) >> (1 x <40!), 56, 145-342 >> 65, 104-225 - dinner: 114-283 >> 68 x1, 86-192 (200x3) >> 65, 170-200 >> 133-286 >> 53-362 >> 230-341 - after dinner: 281 >> n/c >> 176-325 >> 142-274 >> 155-352 >> 103x1, 256-366 -  bedtime: 154-313 >> 113-187 (200s) >> 121, 221 >> 45, 61, 121-302 >> 44, 55, 170->400 >> 258-336  Meals: - breakfast: cereal (frosted flakes) + 2% milk - lunch: sandwich; cheese sticks or cops/crackers (more a snack); popcorn; salads - dinner: fast food; lasagna; ham + veggies - snacks  Euthyroid Hashimoto's thyroiditis  - dx in 08/2013 when she presented with hypothyroid sxs.  She is not on thyroid hh tx.  Last TFTs: Lab Results  Component Value Date   TSH 0.38 01/29/2014   FREET4 1.00 01/29/2014   All labs normal >> we continued off LT4.  - Previously (08/2013): TSH 1.780 Free T4 1.2 (0.82-1.77)  Total T3 130 (71-180) TPO 44 (<34)  Pt denies feeling nodules in neck, hoarseness, dysphagia/odynophagia, SOB with lying down.  Pt describes: - + fatigue - + increased appetite - + cold intolerance - + weight gain - Resolved fatigue - + diarrhea - + hair loss  I reviewed pt's medications, allergies, PMH, social hx, family hx, and changes were documented in the history of present illness. Otherwise, unchanged from my last visit note. She started Cymbalta and stopped Lexapro.  ROS: Constitutional: see HPI, + poor sleep, excessive urination, nocturia Eyes: + blurry vision, no xerophthalmia ENT: no sore throat, no nodules palpated in throat, occasional dysphagia/no odynophagia, no hoarseness Cardiovascular: no CP/SOB/palpitations/leg swelling Respiratory: no cough/SOB  Gastrointestinal: + N/V/+ D/no C Musculoskeletal: no muscle aches/no joint aches Skin: no rashes, + hair loss Neurological: no tremors/numbness/tingling/dizziness, no HA  + low libido  PE: BP 108/64 mmHg  Pulse 73  Temp(Src) 97.9 F (36.6 C) (Oral)  Resp 12  Wt 152 lb 12.8 oz (69.31 kg)  SpO2 97% Body mass index is 24.67 kg/(m^2). Wt Readings from Last 3 Encounters:  08/19/14 152 lb 12.8 oz (69.31 kg)  08/08/14 149 lb 9.6 oz (67.858 kg)  07/15/14 140 lb (63.504 kg)   Constitutional: normal weight,  in NAD Eyes: PERRLA, EOMI, no exophthalmos ENT: moist mucous membranes, no thyromegaly, no cervical lymphadenopathy Cardiovascular: RRR, No MRG Respiratory: CTA B Gastrointestinal: abdomen soft, NT, ND, BS+ Musculoskeletal: no deformities, strength intact in all 4 Skin: moist, warm, no rashes Neurological: no tremor with outstretched hands, DTR normal in all 4  ASSESSMENT: 1. Hashimoto's thyroiditis - euthyroid  2. Post-pancreatectomy DM - C peptide low: 0.6-0.7  PLAN:  1. Patient with euthyroid Hashimoto's thyroiditis. She appears euthyroid. - no goiter, thyroid nodules, or neck compression symptoms - reviewed together last TFTs, which were normal - will check TFTs today  2. Post-pancreatectomy DM - she had total pancreate[facility]. Her transplant failed, and she had to start on an insulin pump. Sugars still very fluctuating, with frequent lows in am mostly , after which, the pump stops, causing her to have hives afterwards. She also increase her sugars greatly after breakfast, therefore we will increase the insulin with this meal (decreased ICR). We will also decrease the ICR with the rest of the meals, but only by 1. since her sugars at night are high, I will increase her basal rate for the night. We'll also increase her basal rate during the day. - I advised her to: Patient Instructions  Please change the pump settings as follows: - basal rates: 12 am: 0.7 units/h >> 0.8 5 am: 0.8 >> 0.7 10 am: 0.8 >> 0.9 10 pm: 0.7 >> 0.8 - ICR:   12 am:12   5 am: 12 >> 10  11 am: 12 >> 11  5 pm: 12 >> 11 - target: 120-120 - ISF: 50 - Insulin on Board: 4h - bolus wizard: on  Please return in 1.5 months.  Please stop at the lab.  - case d/w Cristy FolksLinda Spagnola (DM educator) while patient was here >> showed the pt how to enter the new settings in the pump- last HbA1c improved >> will recheck at next visit - had the flu vaccine this season -Will check a  hemoglobin A1c today  - Return in about 6 weeks (around 09/30/2014).  - time spent with the patient: 40 min, of which >50% was spent in reviewing her pump and CGM downloads, discussing her hypo- and hyper-glycemic episodes, reviewing her previous labs and pump settings and developing a plan to avoid hypo- an hyper-glycemia.   Office Visit on 08/19/2014  Component Date Value Ref Range Status  . TSH 08/19/2014 2.54  0.35 - 4.50 uIU/mL Final  . Free T4 08/19/2014 0.74  0.60 - 1.60 ng/dL Final  . T3, Free 16/10/960402/29/2016 3.1  2.3 - 4.2 pg/mL Final  . Hgb A1c MFr Bld 08/19/2014 9.1* 4.6 - 6.5 % Final   Glycemic Control Guidelines for People with Diabetes:Non Diabetic:  <6%Goal of Therapy: <7%Additional Action Suggested:  >8%    TFTs great. HbA1c higher. See plan above.

## 2014-08-20 NOTE — Progress Notes (Signed)
CC'ED TO PCP 

## 2014-08-21 ENCOUNTER — Telehealth: Payer: Self-pay | Admitting: Nutrition

## 2014-08-21 NOTE — Telephone Encounter (Signed)
Pt. Reports that since leaving here, her blood sugars are going "very high at 2 hours after meals, and then are dropping low at 3-4 hours later, in the 50s.  She is bolusing 10-15 min. Before all meals.   Also,last night, she took a sleeping pill and went to bed at at 9:30PM.  She ate supper at 8PM, did not test blood sugars before bed, and Medtronic pump had a Threshold suspend alarm that went off at 4AM, and again at 6AM.  She woke at 9AM this morning, and blood sugar was 79.

## 2014-08-21 NOTE — Progress Notes (Signed)
Pt was made aware.  

## 2014-08-21 NOTE — Telephone Encounter (Signed)
Pt. Notified to drop the basal rate for all hours by 0.1u/hr.  She agreed to do this.  She was told to call the office if blood sugars continue to drop low

## 2014-08-21 NOTE — Telephone Encounter (Signed)
She needs to reduce her basal rate by 0.1 round the clock to avoid low blood sugars.  Once she is not getting low sugars she can increase her carb ratio at meals, to call Dr. Reece AgarG for this

## 2014-08-22 ENCOUNTER — Telehealth: Payer: Self-pay | Admitting: *Deleted

## 2014-08-22 NOTE — Telephone Encounter (Signed)
Called pt and advised her that her insulin from the patient asst program has arrived. Pt to pick up today.

## 2014-08-23 ENCOUNTER — Telehealth: Payer: Self-pay | Admitting: Internal Medicine

## 2014-08-23 NOTE — Telephone Encounter (Signed)
Patient has question about her insulin medication, please advise

## 2014-08-23 NOTE — Telephone Encounter (Signed)
Called pt back. Pt only got 3 vials when she picked up her pt asst insulin. Advised pt to come back and get the rest of the vials. Pt coming back to pick up.

## 2014-09-07 ENCOUNTER — Other Ambulatory Visit: Payer: Self-pay | Admitting: Internal Medicine

## 2014-10-03 ENCOUNTER — Ambulatory Visit: Payer: BLUE CROSS/BLUE SHIELD | Admitting: Internal Medicine

## 2014-10-07 ENCOUNTER — Ambulatory Visit: Payer: BLUE CROSS/BLUE SHIELD | Admitting: Internal Medicine

## 2014-10-17 ENCOUNTER — Encounter: Payer: Self-pay | Admitting: Gastroenterology

## 2014-10-21 ENCOUNTER — Telehealth: Payer: Self-pay | Admitting: Gastroenterology

## 2014-10-21 NOTE — Telephone Encounter (Signed)
LMOM to call.

## 2014-10-21 NOTE — Telephone Encounter (Signed)
Please call patient and see how diarrhea and weight loss are. She was supposed to have small bowel bacterial overgrowth test in March. See OV note. I also would like to know if she went to dietician as planned.

## 2014-10-22 NOTE — Telephone Encounter (Signed)
LMOM and mailed letter to call.  

## 2014-10-30 ENCOUNTER — Ambulatory Visit: Payer: BLUE CROSS/BLUE SHIELD | Admitting: Internal Medicine

## 2014-10-30 DIAGNOSIS — Z0289 Encounter for other administrative examinations: Secondary | ICD-10-CM

## 2015-04-03 DIAGNOSIS — R112 Nausea with vomiting, unspecified: Secondary | ICD-10-CM | POA: Insufficient documentation

## 2015-04-03 DIAGNOSIS — R109 Unspecified abdominal pain: Secondary | ICD-10-CM | POA: Insufficient documentation

## 2015-05-21 DIAGNOSIS — Z9641 Presence of insulin pump (external) (internal): Secondary | ICD-10-CM | POA: Insufficient documentation

## 2016-02-25 DIAGNOSIS — F17210 Nicotine dependence, cigarettes, uncomplicated: Secondary | ICD-10-CM | POA: Insufficient documentation

## 2016-02-25 DIAGNOSIS — F1120 Opioid dependence, uncomplicated: Secondary | ICD-10-CM | POA: Insufficient documentation

## 2016-07-21 DIAGNOSIS — E785 Hyperlipidemia, unspecified: Secondary | ICD-10-CM | POA: Insufficient documentation

## 2016-07-30 DIAGNOSIS — I739 Peripheral vascular disease, unspecified: Secondary | ICD-10-CM | POA: Insufficient documentation

## 2016-09-20 DIAGNOSIS — N6019 Diffuse cystic mastopathy of unspecified breast: Secondary | ICD-10-CM | POA: Insufficient documentation

## 2016-09-20 DIAGNOSIS — M8580 Other specified disorders of bone density and structure, unspecified site: Secondary | ICD-10-CM | POA: Insufficient documentation

## 2016-09-20 DIAGNOSIS — G4700 Insomnia, unspecified: Secondary | ICD-10-CM | POA: Insufficient documentation

## 2017-03-04 ENCOUNTER — Ambulatory Visit (INDEPENDENT_AMBULATORY_CARE_PROVIDER_SITE_OTHER): Payer: Medicare Other | Admitting: Vascular Surgery

## 2017-03-04 ENCOUNTER — Encounter: Payer: Self-pay | Admitting: Vascular Surgery

## 2017-03-04 VITALS — BP 129/80 | HR 95 | Temp 97.9°F | Resp 16 | Ht 66.0 in | Wt 145.0 lb

## 2017-03-04 DIAGNOSIS — I739 Peripheral vascular disease, unspecified: Secondary | ICD-10-CM

## 2017-03-04 NOTE — Progress Notes (Signed)
Patient ID: Jessica [name], female   DOB: 11-12-1975, 41 y.o.   MRN: [id number]  Reason for Consult: New Patient (Initial Visit) (2nd opinion)   Referred by Jessica Diamond, MD  Subjective:     HPI:  Jessica [name] is a 41 y.o. female presents for second opinion regarding left lower extremity bypass followed by balloon angioplasty of the distal anastomosis last month. Her pain initially started with claudication in the left lower extremity and she had stenting that subsequent failed underwent vein bypass from her left common femoral to above-knee popliteal last February. Last month she then had balloon edge plasty of the distal anastomosis for stenosis recognized on duplex. She continues to have cramping in her bilateral lower extremities although this does occur both at rest and with walking. She states she is able to walk quite far she does walk slowly and she can tolerate the pain there. She has no other rest pain or tissue loss. She's never had a stroke or coronary issues. She states that her mother did have an aortobifemoral bypass a very young age and is also had carotid artery interventions. This patient continues to smoke does take aspirin daily but is intolerant to statins.  Past Medical History:  Diagnosis Date  . Anxiety   . Diverticulosis of colon without hemorrhage   . DM (diabetes mellitus) (HCC)    post pancreatectomy  . GERD (gastroesophageal reflux disease)   . Hashimoto's thyroiditis    euthyroid  . Hypercholesterolemia   . PONV (postoperative nausea and vomiting)    Family History  Problem Relation Age of Onset  . Pancreatic cancer Paternal Grandfather   . Cirrhosis Paternal Grandfather   . Pancreatitis Other        cousin  . Colon cancer Neg Hx   . Throat cancer Mother    Past Surgical History:  Procedure Laterality Date  . ABDOMINAL HYSTERECTOMY    . BIOPSY N/A 05/15/2014   Procedure: GASTIC,  ASCENDING, AND DESCEDNING/SIGMOID  COLON BIOPSY;  Surgeon:  Corbin Ade, MD;  Location: [facility];  Service: Endoscopy;  Laterality: N/A;  . CHOLECYSTECTOMY    . COLONOSCOPY WITH PROPOFOL N/A 05/15/2014   Dr. Jena Gauss (primary GI is Dr. Darrick Penna). Hemorrhoids, negative random colon biopsies. GI pathogen panel negative. Diverticulosis.  [name] complete hysterectomy    . ESOPHAGOGASTRODUODENOSCOPY (EGD) WITH PROPOFOL N/A 05/15/2014   Dr. Jena Gauss (primary GI Dr. Darrick Penna), Billroth II, inflamed residual gastric mucosa, benign biopsies  . 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  [name] KNEE SURGERY Right    X 6-5 arthroscopies and open procedure 1 time- tighten patellar tendon and loose IT band  . 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    Short Social History:  Social History  Substance Use Topics  . Smoking status: Smoker, Current Status Unknown    Packs/day: 1.50    Years: 20.00    Types: Cigarettes  . Smokeless tobacco: Never Used  . Alcohol use No    Allergies  Allergen Reactions  . Buprenorphine Hcl Anaphylaxis  . Erythromycin Anaphylaxis, Hives and Nausea Only  . Erythromycin Base Hives  . 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 Morphine And Related Anaphylaxis  . Niacin Anaphylaxis  . Other Anaphylaxis    seafood  . Penicillins Anaphylaxis  . Shellfish Allergy Anaphylaxis  . Sulfa Antibiotics Hives, Nausea Only and Anaphylaxis  .  Wellbutrin [Bupropion] Other (See Comments)    Suicidal thoughts Suicidal ideation when combined with lexapro Suicidal thoughts  . Levofloxacin Other (See Comments)    Burning during administration IV prior to surgery.Pt was told not to take it again. Told not to take when she had surgery  . Morphine Other (See Comments)    ineffective  . Strawberry Extract Hives  . Sulfamethoxazole Other (See Comments)    Blister in throat, sores in mouth  . Tape Other (See Comments)    Transpore  Tape causes skin to peel;  and Nicoderm Adhesive Patch cause blisters  . Codeine Other (See Comments)    Pt reports medication is "completely" ineffective for pain Pt reports medication is "completely" ineffective for pain  . Doxycycline Hives and Nausea Only  . Keflex [Cephalexin] Hives and Nausea Only  . Levaquin [Levofloxacin In D5w] Hives and Nausea Only  . Rosuvastatin Calcium Other (See Comments)    Muscle pain  . Statins Other (See Comments)    Muscle pain  . Valium [Diazepam] Other (See Comments)    Confusion and amnesia   . Vancomycin Hives and Nausea Only  . Zetia [Ezetimibe] Hives and Nausea Only    Current Outpatient Prescriptions  Medication Sig Dispense Refill  . ARIPiprazole (ABILIFY) 5 MG tablet Take 5 mg by mouth daily.    [name] aspirin EC 81 MG tablet Take 81 mg by mouth daily.    [name] EPIPEN 2-PAK 0.3 MG/0.3ML SOAJ injection Inject 0.3 mg into the skin once.     . fenofibrate (TRICOR) 145 MG tablet Take 145 mg by mouth at bedtime.    [name] glucose blood (BAYER CONTOUR NEXT TEST) test strip Use to test blood sugar 7 times daily as instructed. Coordinates with pt's insulin pump. 700 each 3  . insulin lispro (HUMALOG) 100 UNIT/ML injection Inject 60 units daily via insulin pump. 20 mL 11  . levothyroxine (SYNTHROID, LEVOTHROID) 25 MCG tablet Take 25 mcg by mouth daily before breakfast.    . MICROLET LANCETS MISC USE AS DIRECTED 100 each 0  . Multiple Vitamins-Minerals (HM MULTIVITAMIN ADULT GUMMY PO) Take 2 each by mouth daily.    . Pancrelipase, Lip-Prot-Amyl, (ZENPEP) 40000 UNITS CPEP 3 WITH MEALS PO TID AND 2 WITH SNACKS (Patient taking differently: 5 WITH MEALS PO TID AND 3 WITH SNACKS) 300 capsule 11  . pantoprazole (PROTONIX) 40 MG tablet Take 40 mg by mouth 2 (two) times daily before a meal.    . Vitamin D, Ergocalciferol, (DRISDOL) 50000 UNITS CAPS capsule Take 50,000 Units by mouth every 7 (seven) days.    . buprenorphine (BUTRANS) 5 MCG/HR PTWK patch Place 5 mcg onto the  skin once a week.    . dicyclomine (BENTYL) 10 MG capsule TAKE 1 CAPSULE BY MOUTH FOUR TIMES DAILY BEFORE MEALS AND AT BEDTIME (Patient not taking: Reported on 03/04/2017) 120 capsule 5  . DULoxetine (CYMBALTA) 60 MG capsule Take 60 mg by mouth daily.    [name] escitalopram (LEXAPRO) 5 MG tablet Take 5 mg by mouth daily.    [name] estradiol (VIVELLE-DOT) 0.1 MG/24HR patch Place 1 patch onto the skin 2 (two) times a week.    . ondansetron (ZOFRAN-ODT) 4 MG disintegrating tablet 1 SL Q4-6H PRN FOR NAUSEA OR VOMITING (Patient not taking: Reported on 03/04/2017) 40 tablet 1  . Oxycodone HCl 10 MG TABS Take 10 mg by mouth 3 (three) times daily. 1/2 pill twice daily    . pregabalin (LYRICA) 50 MG capsule Take 75  mg by mouth 3 (three) times daily.      No current facility-administered medications for this visit.     Review of Systems  Constitutional:  Constitutional negative. HENT: HENT negative.  Eyes: Eyes negative.  Respiratory: Respiratory negative.  Cardiovascular: Positive for claudication and leg swelling.  GI: Gastrointestinal negative.  Musculoskeletal: Positive for leg pain.  Skin: Skin negative.  Neurological: Neurological negative. Hematologic: Hematologic/lymphatic negative.  Psychiatric: Psychiatric negative.        Objective:  Objective   Vitals:   03/04/17 1354  BP: 129/80  Pulse: 95  Resp: 16  Temp: 97.9 F (36.6 C)  SpO2: 100%  Weight: 145 lb (65.8 kg)  Height:  (1.676 m)   Body mass index is 23.4 kg/m.  Physical Exam  Constitutional: She appears well-developed.  HENT:  Head: Normocephalic.  Eyes: Pupils are equal, round, and reactive to light.  Neck: Normal range of motion. Neck supple.  Cardiovascular: Normal rate.   Pulses:      Femoral pulses are 2+ on the right side, and 2+ on the left side.      Popliteal pulses are 2+ on the right side, and 2+ on the left side.       Dorsalis pedis pulses are 2+ on the right side, and 2+ on the left side.        Posterior tibial pulses are 1+ on the left side.  Pulmonary/Chest: Effort normal.  Abdominal: Soft. She exhibits no mass.    Data: I have reviewed her most recent angiogram which demonstrated initial stenosis of the distal anastomosis from her femoral-popliteal artery bypass that was resolved with balloon angioplasty. She does have very narrow aorta as well as small iliac and common femoral arteries bilaterally. There is in-line flow on the right side via the anterior tibial artery posterior tibial artery appears to give out just at the level of the ankle. On the left side there is in-line flow via the anterior tibial and posterior tibial arteries.     Assessment/Plan:     41 year old female presents for evaluation of her vascular disease having have a significant family history. She does continue to smoke an average her to stop this once. She will also take full dose aspirin. I told her that I am happy to be her vascular surgeon moving forward but that I would take a hands-off approach only intervene when necessary. This is due to her history of pancreatitis and status post total pancreatectomy and she has had 3 intra-abdominal surgeries in the past. This is also due to the fact that she had stenting of her left SFA that failed very soon thereafter and subsequently had a left fem to above-knee popliteal artery bypass require balloon angioplasty of the distal anastomosis 6 months later. She will take aspirin 325 and continue on her TriCor for hypercholesterolemia. I'll see her back in 3 months with a duplex of her left Lotrimin bypass and ABI should she choose to follow up with me. She can continue to work towards stopping smoking in the interim time period     Maeola Harman MD [facility]

## 2017-03-14 NOTE — Addendum Note (Signed)
Addended by: Burton Apley A on: 03/14/2017 10:35 AM   Modules accepted: Orders

## 2017-03-15 DIAGNOSIS — Z79899 Other long term (current) drug therapy: Secondary | ICD-10-CM | POA: Insufficient documentation

## 2017-03-29 ENCOUNTER — Ambulatory Visit (INDEPENDENT_AMBULATORY_CARE_PROVIDER_SITE_OTHER): Payer: Medicare Other | Admitting: Psychiatry

## 2017-03-29 ENCOUNTER — Encounter (HOSPITAL_COMMUNITY): Payer: Self-pay | Admitting: Psychiatry

## 2017-03-29 VITALS — BP 118/68 | HR 88 | Ht 66.0 in | Wt 145.8 lb

## 2017-03-29 DIAGNOSIS — R4584 Anhedonia: Secondary | ICD-10-CM

## 2017-03-29 DIAGNOSIS — R197 Diarrhea, unspecified: Secondary | ICD-10-CM

## 2017-03-29 DIAGNOSIS — Z9884 Bariatric surgery status: Secondary | ICD-10-CM | POA: Diagnosis not present

## 2017-03-29 DIAGNOSIS — F5105 Insomnia due to other mental disorder: Secondary | ICD-10-CM

## 2017-03-29 DIAGNOSIS — F332 Major depressive disorder, recurrent severe without psychotic features: Secondary | ICD-10-CM | POA: Diagnosis not present

## 2017-03-29 DIAGNOSIS — Z90410 Acquired total absence of pancreas: Secondary | ICD-10-CM | POA: Diagnosis not present

## 2017-03-29 DIAGNOSIS — R454 Irritability and anger: Secondary | ICD-10-CM

## 2017-03-29 DIAGNOSIS — E139 Other specified diabetes mellitus without complications: Secondary | ICD-10-CM

## 2017-03-29 DIAGNOSIS — R110 Nausea: Secondary | ICD-10-CM

## 2017-03-29 DIAGNOSIS — F17210 Nicotine dependence, cigarettes, uncomplicated: Secondary | ICD-10-CM

## 2017-03-29 DIAGNOSIS — G4700 Insomnia, unspecified: Secondary | ICD-10-CM

## 2017-03-29 DIAGNOSIS — R5383 Other fatigue: Secondary | ICD-10-CM

## 2017-03-29 DIAGNOSIS — I739 Peripheral vascular disease, unspecified: Secondary | ICD-10-CM | POA: Diagnosis not present

## 2017-03-29 DIAGNOSIS — R450 Nervousness: Secondary | ICD-10-CM

## 2017-03-29 DIAGNOSIS — F99 Mental disorder, not otherwise specified: Secondary | ICD-10-CM

## 2017-03-29 DIAGNOSIS — R451 Restlessness and agitation: Secondary | ICD-10-CM

## 2017-03-29 DIAGNOSIS — E891 Postprocedural hypoinsulinemia: Secondary | ICD-10-CM | POA: Diagnosis not present

## 2017-03-29 DIAGNOSIS — G4710 Hypersomnia, unspecified: Secondary | ICD-10-CM | POA: Diagnosis not present

## 2017-03-29 MED ORDER — RAMELTEON 8 MG PO TABS: 8.0000 mg | ORAL_TABLET | Freq: Every day | ORAL | 1 refills | Status: DC

## 2017-03-29 NOTE — Progress Notes (Signed)
Psychiatric Initial Adult Assessment   Patient Identification: Jessica [name] MRN:  [id number] Date of Evaluation:  03/29/2017 Referral Source: self, pcp Chief Complaint:  severe depression Visit Diagnosis:    ICD-10-CM   1. Severe episode of recurrent major depressive disorder, without psychotic features (HCC) F33.2 Ambulatory referral to Psychiatry    ramelteon (ROZEREM) 8 MG tablet  2. 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 Peripheral vascular disease (HCC) I73.9   4. H/O gastric bypass Z98.84   5. Insomnia due to other mental disorder F51.05    F99    History of Present Illness:  Jessica [name] is a 41 year old female with a history of severe major depressive disorder lasting for greater than 2 years. She has had associated anhedonia, fatigue, psychomotor slowing, poor frustration tolerance. 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 She denies any history of alcohol abuse. She reports that she had necrotic pancreatitis, so she had a pancreatectomy in approximately 2015, resulting iatrogenic type 1 diabetes, required islet cell transplant.  She reports that she has also had to have a Roux-en-Y gastric bypass due to partial colectomy and adhesions affecting her gastrointestinal health. She reports that she also required a splenectomy.  She reports that a significant factor contributing to her depression has been all of her healthcare issues, but reports that she is also struggled with periods of depression off and on over the years. She has never required psychiatric care until approximately one and a half years ago, when she was psychiatrically hospitalized for an acute psychotic episode in the setting of multiple medication changes. This resolved with  discontinuation of Wellbutrin.  She has been tried on Effexor, Prozac, Paxil, Seroquel, trazodone, Cymbalta, Zoloft, Wellbutrin, Lexapro, Celexa, amitriptyline, Ambien, Lunesta, Balsomra and currently taking Abilify 10 mg. She reports that her mood symptoms continue and she also feels fatigue from Abilify.  I spent time with her reviewing her family and social history, and reviewing her current support system. She has good support from her husband who is able to drive her to and from visits. I educated her on ECT, and given her multiple medication trials, and complex metabolism, I recommended ECT consultation. We alternatively discussed the use of the Emsam patch, but given her severe peripheral vascular disease, this does cause some concern about risks of increased claudication. She was agreeable to ECT consult and to follow-up in this clinic as needed.  Associated Signs/Symptoms: Depression Symptoms:  depressed mood, anhedonia, insomnia, hypersomnia, psychomotor agitation, psychomotor retardation, fatigue, feelings of worthlessness/guilt, difficulty concentrating, hopelessness, recurrent thoughts of death, (Hypo) Manic Symptoms:  Irritable Mood, Anxiety Symptoms:  Excessive Worry, Psychotic Symptoms:  none PTSD Symptoms: Negative  Past Psychiatric History: One psychiatric hospitalizations for psychotic symptoms in the setting of Lexapro and Wellbutrin titration  Previous Psychotropic Medications: Yes see history  Substance Abuse History in the last 12 months:  No.  Consequences of Substance Abuse: Negative  Past Medical History:  Past Medical History:  Diagnosis Date  . Anxiety   . Diverticulosis of colon without hemorrhage   . DM (diabetes mellitus) (HCC)    post pancreatectomy  . GERD (gastroesophageal reflux disease)   . Hashimoto's thyroiditis    euthyroid  . Hypercholesterolemia   . PONV (postoperative nausea  and vomiting)     Past Surgical History:  Procedure  Laterality Date  . ABDOMINAL HYSTERECTOMY    . BIOPSY N/A 05/15/2014   Procedure: GASTIC,  ASCENDING, AND DESCEDNING/SIGMOID  COLON BIOPSY;  Surgeon: Corbin Ade, MD;  Location: [facility];  Service: Endoscopy;  Laterality: N/A;  . CHOLECYSTECTOMY    . COLONOSCOPY WITH PROPOFOL N/A 05/15/2014   Dr. Jena Gauss (primary GI is Dr. Darrick Penna). Hemorrhoids, negative random colon biopsies. GI pathogen panel negative. Diverticulosis.  [name] complete hysterectomy    . ESOPHAGOGASTRODUODENOSCOPY (EGD) WITH PROPOFOL N/A 05/15/2014   Dr. Jena Gauss (primary GI Dr. Darrick Penna), Billroth II, inflamed residual gastric mucosa, benign biopsies  . FEMORAL-POPLITEAL BYPASS GRAFT    . 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  [name] KNEE SURGERY Right    X 6-5 arthroscopies and open procedure 1 time- tighten patellar tendon and loose IT band  . 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    Family Psychiatric History: As below  Family History:  Family History  Problem Relation Age of Onset  . Pancreatic cancer Paternal Grandfather   . Cirrhosis Paternal Grandfather   . Pancreatitis Other        cousin  . Throat cancer Mother   . Colon cancer Neg Hx     Social History:   Social History   Social History  . Marital status: Married    Spouse name: N/A  . Number of children: 0  . Years of education: N/A   Occupational History  . pharmacy tech    Social History Main Topics  . Smoking status: Smoker, Current Status Unknown    Packs/day: 1.50    Years: 20.00    Types: Cigarettes  . Smokeless tobacco: Never Used  . Alcohol use No  . Drug use: No  . Sexual activity: Yes    Birth control/ protection: None   Other Topics Concern  . None   Social History Narrative  . None    Additional Social History: Lives with her husband of 8 years in [place]  Allergies:   Allergies  Allergen Reactions  . Buprenorphine Hcl Anaphylaxis  . Erythromycin Anaphylaxis, Hives and Nausea  Only  . Erythromycin Base Hives  . 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 Morphine And Related Anaphylaxis  . Niacin Anaphylaxis  . Other Anaphylaxis    seafood  . Penicillins Anaphylaxis  . Shellfish Allergy Anaphylaxis  . Sulfa Antibiotics Hives, Nausea Only and Anaphylaxis  . Wellbutrin [Bupropion] Other (See Comments)    Suicidal thoughts Suicidal ideation when combined with lexapro Suicidal thoughts  . Levofloxacin Other (See Comments)    Burning during administration IV prior to surgery.Pt was told not to take it again. Told not to take when she had surgery  . Morphine Other (See Comments)    ineffective  . Strawberry Extract Hives  . Sulfamethoxazole Other (See Comments)    Blister in throat, sores in mouth  . Tape Other (See Comments)    Transpore Tape causes skin to peel;  and Nicoderm Adhesive Patch cause blisters  . Codeine Other (See Comments)    Pt reports medication is "completely" ineffective for pain Pt reports medication is "completely" ineffective for pain  . Doxycycline Hives and Nausea Only  . Keflex [Cephalexin] Hives and Nausea Only  . Levaquin [Levofloxacin In D5w] Hives and Nausea Only  . Rosuvastatin Calcium Other (See  Comments)    Muscle pain  . Statins Other (See Comments)    Muscle pain  . Valium [Diazepam] Other (See Comments)    Confusion and amnesia   . Vancomycin Hives and Nausea Only  . Zetia [Ezetimibe] Hives and Nausea Only    Metabolic Disorder Labs: Lab Results  Component Value Date   HGBA1C 9.1 (H) 08/19/2014   No results found for: PROLACTIN No results found for: CHOL, TRIG, HDL, CHOLHDL, VLDL, LDLCALC   Current Medications: Current Outpatient Prescriptions  Medication Sig Dispense Refill  . ARIPiprazole (ABILIFY) 5 MG tablet Take 5 mg by mouth daily.    [name] aspirin EC 81 MG tablet Take 81 mg by mouth daily.    [name] EPIPEN 2-PAK 0.3 MG/0.3ML  SOAJ injection Inject 0.3 mg into the skin once.     . fenofibrate (TRICOR) 145 MG tablet Take 145 mg by mouth at bedtime.    [name] glucose blood (BAYER CONTOUR NEXT TEST) test strip Use to test blood sugar 7 times daily as instructed. Coordinates with pt's insulin pump. 700 each 3  . insulin lispro (HUMALOG) 100 UNIT/ML injection Inject 60 units daily via insulin pump. 20 mL 11  . levothyroxine (SYNTHROID, LEVOTHROID) 25 MCG tablet Take 25 mcg by mouth daily before breakfast.    . MICROLET LANCETS MISC USE AS DIRECTED 100 each 0  . Multiple Vitamins-Minerals (HM MULTIVITAMIN ADULT GUMMY PO) Take 2 each by mouth daily.    . Pancrelipase, Lip-Prot-Amyl, (PERTZYE) 16000 units CPEP Pertzye    . pantoprazole (PROTONIX) 40 MG tablet Take 40 mg by mouth 2 (two) times daily before a meal.    . Vitamin D, Ergocalciferol, (DRISDOL) 50000 UNITS CAPS capsule Take 50,000 Units by mouth every 7 (seven) days.    . ramelteon (ROZEREM) 8 MG tablet Take 1 tablet (8 mg total) by mouth at bedtime. 30 tablet 1   No current facility-administered medications for this visit.     Neurologic: Headache: Negative Seizure: Negative Paresthesias:Negative  Musculoskeletal: Strength & Muscle Tone: within normal limits Gait & Station: normal Patient leans: N/A  Psychiatric Specialty Exam: Review of Systems  Constitutional: Negative.   HENT: Negative.   Respiratory: Negative.   Cardiovascular: Negative.   Gastrointestinal: Positive for diarrhea and nausea.  Musculoskeletal: Positive for myalgias.  Neurological: Negative.   Psychiatric/Behavioral: Positive for depression and suicidal ideas. The patient is nervous/anxious and has insomnia.     Blood pressure 118/68, pulse 88, height  (1.676 m), weight 145 lb 12.8 oz (66.1 kg).Body mass index is 23.53 kg/m.  General Appearance: Casual and Fairly Groomed  Eye Contact:  Fair  Speech:  Clear and Coherent  Volume:  Normal  Mood:  Depressed and Dysphoric  Affect:   Constricted and Depressed  Thought Process:  Coherent and Goal Directed  Orientation:  Full (Time, Place, and Person)  Thought Content:  Logical  Suicidal Thoughts:  Yes.  without intent/plan  Homicidal Thoughts:  No  Memory:  Recent;   Fair  Judgement:  Fair  Insight:  Fair  Psychomotor Activity:  Normal  Concentration:  Concentration: Fair  Recall:  [employer] of Knowledge:Good  Language: Good  Akathisia:  Negative  Handed:  Right  AIMS (if indicated):  0  Assets:  Communication Skills Desire for Improvement Financial Resources/Insurance Housing  ADL's:  Intact  Cognition: WNL  Sleep:  4-5 hours    Treatment Plan Summary: Jessica [name] is a 41 year old female with severe treatment resistant major depressive disorder,  in the context of multiple metabolic and gastrointestinal issues. She does not have any history of cardiac disease, no history of seizures. Her depressive symptoms have persisted for nearly 3 years without any significant remission. And tried on Effexor, Prozac, Paxil, Seroquel, trazodone, Cymbalta, Zoloft, Wellbutrin, Lexapro, Celexa, amitriptyline, Ambien, Lunesta, Balsomra and currently taking Abilify 10 mg. I believe she would be an appropriate candidate for an ECT consultation and she is agreeable to this recommendation.  1. Severe episode of recurrent major depressive disorder, without psychotic features (HCC)   2. 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 Peripheral vascular disease (HCC)   4. H/O gastric bypass   5. Insomnia due to other mental disorder    Continue Abilify 10 mg daily; taper as she starts ECT Referral for ECT consultation with Dr. Toni Amend Initiate ramelteon 8 mg for sleep; she has previously failed Ambien, Lunesta, Seroquel, Xanax, belsomra Discontinue Xanax 0.5 mg nightly given lack of benefit  I spent 45 minutes with the patient in direct care and counseling. We discussed past medical and psychiatric history, ongoing  current symptoms, response to past medication trials and treatment planning moving forward.      Burnard Leigh, MD 10/9/201812:20 PM

## 2017-04-05 ENCOUNTER — Encounter: Payer: Self-pay | Admitting: Psychiatry

## 2017-04-05 ENCOUNTER — Ambulatory Visit (INDEPENDENT_AMBULATORY_CARE_PROVIDER_SITE_OTHER): Payer: Medicare Other | Admitting: Psychiatry

## 2017-04-05 ENCOUNTER — Telehealth (HOSPITAL_COMMUNITY): Payer: Self-pay | Admitting: *Deleted

## 2017-04-05 VITALS — BP 117/78 | HR 91 | Temp 98.8°F | Wt 148.4 lb

## 2017-04-05 DIAGNOSIS — G8918 Other acute postprocedural pain: Secondary | ICD-10-CM | POA: Insufficient documentation

## 2017-04-05 DIAGNOSIS — F331 Major depressive disorder, recurrent, moderate: Secondary | ICD-10-CM

## 2017-04-05 DIAGNOSIS — K8689 Other specified diseases of pancreas: Secondary | ICD-10-CM | POA: Insufficient documentation

## 2017-04-05 NOTE — Progress Notes (Signed)
ECT: This is an appointment for evaluation for possible ECT treatment. Patient referred by outpatient psychiatrist in [place]. Patient was accompanied by a friend whom she wanted to have on hand as emotional support. Chart reviewed patient interviewed. Patient reports that she currently feels like her mood varies a bit from day-to-day. It is never very good but much of the time it is just flat and is "so-so". She does have spells of getting overwhelmed especially with the stresses involving her medical condition and what sounds like a difficult relationship with her husband. Patient has chronic insomnia that has been present for years and resisted multiple efforts at treatment. Appetite is so-so and depends to some extent on her medical issues. She denies having any suicidal thoughts whatsoever. She denies any hallucinations. Patient reports that much of her day is taken up by her medical care or doctor's visits although she does have positive things in her life that she tries to focus on. Patient is currently taking 10 mg a day of Abilify but is not on any other medicines for depression. She just started seeing a new outpatient psychiatrist in [place]. Major stresses as noted previously include what she perceives as a very unsupportive attitude on the part of her husband as well as her fears about her medical problems.  Social history: Patient is married. Lives with her husband. Patient previously had been employed doing active work but has not been able to go back to work since having surgery on her pancreas.  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 Subsequently developed diabetes. Blood sugars are very uncontrolled. She showed me a graph on an electronic device that showed wild fluctuations from extremely hyperglycemic 2 hypoglycemic all within 24 hours. Patient also reports that she has peripheral vascular disease with some blockage in her aorta. She is  reportedly scheduled to have surgery for a aortic to brachial bypass and has already had bypasses to her femorals. She says she has been told by vascular surgery she has significant risk of losing her legs.  Substance abuse history: Denies any history of alcohol or drug abuse.  Patient's past psychiatric history is that she has had symptoms of depression for several years. She has only had one psychiatric hospitalization which resulted from a spell of hallucinations. Patient believes the hallucinations occurred because of a combination of medications that were intolerable. Outside of that she denies ever having had psychotic symptoms. Patient denies any history of self injury or suicide attempts or violence. Report any clear episodes of mania. She has been on multiple antidepressants and mood stabilizers and says that she has had little benefit from them. Sometimes it will seem like they work a little bit but as doses are increased the benefit lessons. Patient has had some supportive counseling in the past but it doesn't sound from her description like she has had formal or manual lysed psychotherapy. She has not considered transcranial magnetic stimulation or other treatments for depression.  Neatly dressed and groomed woman looks her stated age. Good eye contact. Normal psychomotor activity. Speech normal rate and tone. Affect is appropriate and reactive and full range. Does not appear to be constricted or obviously "depressed". Thoughts are lucid without any sign of thought disorder. No thought blocking. Good insight and judgment. Good understanding of her condition. Patient denies any suicidal or homicidal thoughts. Denies any psychotic symptoms. She is alert and oriented 4. Short and long-term memory intact. Definitely appears to have ability to make reasonable  decisions about her care.  This is a 41 year old woman who has had several years of depressive symptoms of varying severity. Currently she  reports that she feels down much of the time but has continued to function at least at the level of taking care of her own health. She feels sluggish but a lot of that is from her medical problems. She denies hopelessness. Denies any suicidal ideation denies any psychosis. She seems to me to currently be in the moderate range of depression. Patient has failed multiple medication trials. She has however some very concerning medical problems that I think put her at higher than average risk for anesthesia and ECT. I am very concerned about the wild fluctuations in her blood sugar as well as the peripheral vascular disease which would presumably increase her risk of strokes or heart attacks or other acute vascular incidents.  Discussed the nature of ECT the risks and benefits with the patient. My recommendation to her was that at this point I would not recommend ECT as her next step. I told her that I could virtually guarantee her that she would have significant side effects that would be bothersome to her but that I could not give her a clear idea of how likely it is that her mood would feel better. I recommended to the patient that she go back to her outpatient psychiatrist in [place]. I informally discussed with her the possibility of pursuing transcranial magnetic stimulation and briefly describe that treatment. I also mentioned ketamine treatment as being a possible option. Most importantly however I strongly encouraged her to get involved with a cognitive behavioral psychotherapist. Patient understood and is in agreement with the plan. I will send an email to her outpatient psychiatrist who made the referral. Patient knows that she can recontact my office if circumstances change or she has further questions.

## 2017-04-05 NOTE — Telephone Encounter (Signed)
Prior authorization for Rozerem received. Submitted online with cover my meds.Key code [id number].  Awaiting response to be faxed to office.

## 2017-04-12 ENCOUNTER — Encounter (HOSPITAL_COMMUNITY): Payer: Self-pay | Admitting: Psychiatry

## 2017-04-12 ENCOUNTER — Other Ambulatory Visit (HOSPITAL_COMMUNITY): Payer: Self-pay | Admitting: Psychiatry

## 2017-04-12 MED ORDER — TRAZODONE HCL 50 MG PO TABS: 50.0000 mg | ORAL_TABLET | Freq: Every evening | ORAL | 1 refills | Status: DC | PRN

## 2017-04-15 ENCOUNTER — Other Ambulatory Visit: Payer: Self-pay | Admitting: *Deleted

## 2017-04-15 ENCOUNTER — Encounter: Payer: Self-pay | Admitting: *Deleted

## 2017-04-15 ENCOUNTER — Ambulatory Visit (INDEPENDENT_AMBULATORY_CARE_PROVIDER_SITE_OTHER): Payer: Medicare Other | Admitting: Vascular Surgery

## 2017-04-15 ENCOUNTER — Encounter: Payer: Self-pay | Admitting: Vascular Surgery

## 2017-04-15 VITALS — BP 128/85 | HR 80 | Temp 97.5°F | Resp 14 | Ht 66.0 in | Wt 147.0 lb

## 2017-04-15 DIAGNOSIS — I739 Peripheral vascular disease, unspecified: Secondary | ICD-10-CM | POA: Diagnosis not present

## 2017-04-15 MED ORDER — NICOTINE 7 MG/24HR TD PT24: 21.0000 mg | MEDICATED_PATCH | Freq: Every day | TRANSDERMAL | Status: DC

## 2017-04-15 NOTE — Progress Notes (Signed)
Patient ID: Jessica [name], female   DOB: 1975-07-09, 41 y.o.   MRN: [id number]  Reason for Consult: PAD (left leg pain)   Referred by Virl Diamond, MD  Subjective:     HPI:  Jessica [name] is a 41 y.o. female with a history of left lower extremity bypass followed by balloon angioplasty of her distal anastomosis.  At the time I last saw her she was having pain in her left leg and now in the past 3-4 days the pain is much worse and she has very very short distance claudication.  She also has pain in her left thigh.  She does not complain of pain in her right leg is much.  She does not have any chest pain.  She does have a history of pancreas surgery with 2 exploratory laparotomy incisions and a total pancreatectomy..  She does not have rest pain or tissue loss of her left lower extremity.  Past Medical History:  Diagnosis Date  . ADHD (attention deficit hyperactivity disorder)   . Anxiety   . Diabetes mellitus type I (HCC)   . Diverticulosis of colon without hemorrhage   . DM (diabetes mellitus) (HCC)    post pancreatectomy  . GERD (gastroesophageal reflux disease)   . Hashimoto's thyroiditis    euthyroid  . Hypercholesterolemia   . PONV (postoperative nausea and vomiting)    Family History  Problem Relation Age of Onset  . Pancreatic cancer Paternal Grandfather   . Cirrhosis Paternal Grandfather   . Pancreatitis Other        cousin  . Throat cancer Mother   . Colon cancer Neg Hx    Past Surgical History:  Procedure Laterality Date  . ABDOMINAL HYSTERECTOMY    . BIOPSY N/A 05/15/2014   Procedure: GASTIC,  ASCENDING, AND DESCEDNING/SIGMOID  COLON BIOPSY;  Surgeon: Corbin Ade, MD;  Location: [facility];  Service: Endoscopy;  Laterality: N/A;  . CHOLECYSTECTOMY    . COLONOSCOPY WITH PROPOFOL N/A 05/15/2014   Dr. Jena Gauss (primary GI is Dr. Darrick Penna). Hemorrhoids, negative random colon biopsies. GI pathogen panel negative. Diverticulosis.  [name] complete hysterectomy    .  ESOPHAGOGASTRODUODENOSCOPY (EGD) WITH PROPOFOL N/A 05/15/2014   Dr. Jena Gauss (primary GI Dr. Darrick Penna), Billroth II, inflamed residual gastric mucosa, benign biopsies  . FEMORAL-POPLITEAL BYPASS GRAFT    . 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  [name] KNEE SURGERY Right    X 6-5 arthroscopies and open procedure 1 time- tighten patellar tendon and loose IT band  . 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    Short Social History:  Social History  Substance Use Topics  . Smoking status: Current Every Day Smoker    Packs/day: 1.50    Years: 20.00    Types: Cigarettes  . Smokeless tobacco: Never Used  . Alcohol use No    Allergies  Allergen Reactions  . Buprenorphine Hcl Anaphylaxis  . Erythromycin Anaphylaxis, Hives and Nausea Only  . Erythromycin Base Hives  . 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 Morphine And Related Anaphylaxis  . Niacin Anaphylaxis  . Other Anaphylaxis and Other (See Comments)    Pt is unsure of reaction- was told by medical personal to "never take again" (2010); seafood  . Penicillins Anaphylaxis  . Shellfish Allergy Anaphylaxis  . Shellfish-Derived Products Anaphylaxis  . Sulfa Antibiotics Hives, Nausea Only and Anaphylaxis  .  Wellbutrin [Bupropion] Other (See Comments)    Suicidal thoughts Suicidal ideation when combined with lexapro Suicidal thoughts  . Levofloxacin Other (See Comments)    Burning during administration IV prior to surgery.Pt was told not to take it again. Told not to take when she had surgery  . Morphine Other (See Comments)    ineffective  . Strawberry Extract Hives  . Sulfamethoxazole Other (See Comments)    Blister in throat, sores in mouth  . Tape Other (See Comments)    Transpore Tape causes skin to peel;  and Nicoderm Adhesive Patch cause blisters  . Codeine Other (See Comments)    Pt reports  medication is "completely" ineffective for pain Pt reports medication is "completely" ineffective for pain  . Doxycycline Hives and Nausea Only  . Keflex [Cephalexin] Hives and Nausea Only  . Levaquin [Levofloxacin In D5w] Hives and Nausea Only  . Rosuvastatin Calcium Other (See Comments)    Muscle pain  . Statins Other (See Comments)    Muscle pain  . Valium [Diazepam] Other (See Comments)    Confusion and amnesia   . Vancomycin Hives and Nausea Only  . Zetia [Ezetimibe] Hives and Nausea Only    Current Outpatient Prescriptions  Medication Sig Dispense Refill  . aspirin EC 81 MG tablet Take 81 mg by mouth daily.    [name] EPIPEN 2-PAK 0.3 MG/0.3ML SOAJ injection Inject 0.3 mg into the skin once.     . fenofibrate (TRICOR) 145 MG tablet Take 145 mg by mouth at bedtime.    [name] glucose blood (BAYER CONTOUR NEXT TEST) test strip Use to test blood sugar 7 times daily as instructed. Coordinates with pt's insulin pump. 700 each 3  . insulin lispro (HUMALOG) 100 UNIT/ML injection Inject 60 units daily via insulin pump. 20 mL 11  . levothyroxine (SYNTHROID, LEVOTHROID) 25 MCG tablet Take 25 mcg by mouth daily before breakfast.    . MICROLET LANCETS MISC USE AS DIRECTED 100 each 0  . Multiple Vitamins-Minerals (HM MULTIVITAMIN ADULT GUMMY PO) Take 2 each by mouth daily.    . Pancrelipase, Lip-Prot-Amyl, (PERTZYE) 16000 units CPEP Pertzye    . pantoprazole (PROTONIX) 40 MG tablet Take 40 mg by mouth 2 (two) times daily before a meal.    . ramelteon (ROZEREM) 8 MG tablet Take 1 tablet (8 mg total) by mouth at bedtime. 30 tablet 1  . traZODone (DESYREL) 50 MG tablet Take 1 tablet (50 mg total) by mouth at bedtime as needed for sleep. 60 tablet 1  . Vitamin D, Ergocalciferol, (DRISDOL) 50000 UNITS CAPS capsule Take 50,000 Units by mouth every 7 (seven) days.    . ARIPiprazole (ABILIFY) 5 MG tablet Take 5 mg by mouth daily.     Current Facility-Administered Medications  Medication Dose Route Frequency  Provider Last Rate Last Dose  . nicotine (NICODERM CQ - dosed in mg/24 hr) patch 21 mg  21 mg Transdermal Daily Maeola Harman, MD        Review of Systems  Constitutional:  Constitutional negative. HENT: HENT negative.  Eyes: Eyes negative.  Respiratory: Respiratory negative.  Cardiovascular: Positive for claudication and leg swelling.  GI: Positive for abdominal pain.  Musculoskeletal: Musculoskeletal negative.  Skin: Skin negative.  Neurological: Neurological negative. Hematologic: Hematologic/lymphatic negative.  Psychiatric: Positive for depressed mood.        Objective:  Objective   Vitals:   04/15/17 0913  BP: 128/85  Pulse: 80  Resp: 14  Temp: (!) 97.5 F (36.4 C)  SpO2: 100%  Weight: 147 lb (66.7 kg)  Height: 5\' 6"  (1.676 m)   Body mass index is 23.73 kg/m.  Physical Exam  Constitutional: She is oriented to person, place, and time. She appears well-developed.  HENT:  Head: Normocephalic.  Eyes: Pupils are equal, round, and reactive to light.  Neck: Normal range of motion.  Cardiovascular: Normal rate.   Pulses:      Femoral pulses are 1+ on the right side, and 1+ on the left side.      Dorsalis pedis pulses are 1+ on the right side.  Monophasic dp/pt on left  Abdominal: Soft.  Well healed midline incisions  Musculoskeletal: Normal range of motion. She exhibits no edema.  Neurological: She is alert and oriented to person, place, and time.  Skin: Skin is warm and dry.  Psychiatric: Her behavior is normal. Judgment and thought content normal.    Data: No new studies     Assessment/Plan:     41 year old female with a history of a left lower extremity bypass with a subsequent balloon angioplasty.  This was performed after failure of stents on the left.  She now has very short distance claudication in the left lower extremity and I am concerned there is an inflow issue looking at her last angiogram with very narrow aorta and common iliac  arteries.  I discussed with her the importance of smoking cessation and how this is self-injurious behavior.  I have also discussed with her the risk benefits of proceeding with angiogram looking at her left lower extremity and that we will possibly need to place bilateral common iliac artery stents.  I have also discussed with her that  in the event these stents fail she is looking at possible aortobifemoral bypass which would possibly need to be done from a retroperitoneal approach versus bilateral axillary bifemoral bypass grafting for inflow.  She does demonstrate good understanding.  I have sent NicoDerm to her pharmacy and we will get her scheduled for angiogram today.     Maeola HarmanBrandon Christopher Cain MD [facility]

## 2017-04-22 ENCOUNTER — Encounter (HOSPITAL_COMMUNITY): Payer: Self-pay | Admitting: Psychiatry

## 2017-04-22 ENCOUNTER — Ambulatory Visit (INDEPENDENT_AMBULATORY_CARE_PROVIDER_SITE_OTHER): Payer: Medicare Other | Admitting: Psychiatry

## 2017-04-22 VITALS — BP 124/72 | HR 88 | Ht 66.0 in | Wt 149.6 lb

## 2017-04-22 DIAGNOSIS — F17210 Nicotine dependence, cigarettes, uncomplicated: Secondary | ICD-10-CM

## 2017-04-22 DIAGNOSIS — F5105 Insomnia due to other mental disorder: Secondary | ICD-10-CM

## 2017-04-22 DIAGNOSIS — Z79899 Other long term (current) drug therapy: Secondary | ICD-10-CM

## 2017-04-22 DIAGNOSIS — F331 Major depressive disorder, recurrent, moderate: Secondary | ICD-10-CM | POA: Diagnosis not present

## 2017-04-22 DIAGNOSIS — F99 Mental disorder, not otherwise specified: Secondary | ICD-10-CM

## 2017-04-22 DIAGNOSIS — F39 Unspecified mood [affective] disorder: Secondary | ICD-10-CM

## 2017-04-22 MED ORDER — TRAZODONE HCL 50 MG PO TABS: 50.0000 mg | ORAL_TABLET | Freq: Every evening | ORAL | 1 refills | Status: DC | PRN

## 2017-04-22 MED ORDER — LURASIDONE HCL 40 MG PO TABS: 40.0000 mg | ORAL_TABLET | Freq: Every day | ORAL | 1 refills | Status: DC

## 2017-04-22 NOTE — Progress Notes (Signed)
[facility] MD/PA/NP OP Progress Note  04/22/2017 12:03 PM Jessica [name]  MRN:  [id number]  Chief Complaint: Abilify stopped working HPI: Jessica [name] presents today for med management follow-up.  She continues to struggle with anxiety, agitation, irritability, poor sleep.  She reports that she stopped taking Abilify because it was not helping.  We again reviewed her significant history of past trials of SSRI.  She feels like Abilify helped the most of anything that she has taken in the past.  We discussed her ECT consultation, and the plan to hold off on ECT at this time.  I suggested we initiate Latuda, and reviewed that this is similar in class to Abilify and carries risks and benefits of antipsychotics as previously discussed.  She was agreeable to a dose of 20 mg nightly for 2 weeks and then increase to 40 mg.  She is slept okay with trazodone and ramelteon in conjunction -discussed that she can hold these medicines while she initiates Latuda to see if Jessica [name] is able to help with sleep difficulties and mood.  She was receptive to finding an individual therapist near her that she can continue to work with on a weekly basis.  I encouraged her that medication management and therapy in conjunction tend to be most effective.  Visit Diagnosis:    ICD-10-CM   1. 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 Insomnia due to other mental disorder F51.05    F99   3. Unspecified mood (affective) disorder (HCC) F39     Past Psychiatric History: See intake H&P for full details. Reviewed, with no updates at this time.   Past Medical History:  Past Medical History:  Diagnosis Date  . ADHD (attention deficit hyperactivity disorder)   . Anxiety   . Diabetes mellitus type I (HCC)   . Diverticulosis of colon without hemorrhage   . DM (diabetes mellitus) (HCC)    post pancreatectomy  . GERD (gastroesophageal reflux disease)   . Hashimoto's thyroiditis    euthyroid  . Hypercholesterolemia   .  PONV (postoperative nausea and vomiting)     Past Surgical History:  Procedure Laterality Date  . ABDOMINAL HYSTERECTOMY    . BIOPSY N/A 05/15/2014   Procedure: GASTIC,  ASCENDING, AND DESCEDNING/SIGMOID  COLON BIOPSY;  Surgeon: Corbin Ade, MD;  Location: [facility];  Service: Endoscopy;  Laterality: N/A;  . CHOLECYSTECTOMY    . COLONOSCOPY WITH PROPOFOL N/A 05/15/2014   Dr. Jena Gauss (primary GI is Dr. Darrick Penna). Hemorrhoids, negative random colon biopsies. GI pathogen panel negative. Diverticulosis.  [name] complete hysterectomy    . ESOPHAGOGASTRODUODENOSCOPY (EGD) WITH PROPOFOL N/A 05/15/2014   Dr. Jena Gauss (primary GI Dr. Darrick Penna), Billroth II, inflamed residual gastric mucosa, benign biopsies  . FEMORAL-POPLITEAL BYPASS GRAFT    . 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  [name] KNEE SURGERY Right    X 6-5 arthroscopies and open procedure 1 time- tighten patellar tendon and loose IT band  . 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    Family Psychiatric History: See intake H&P for full details. Reviewed, with no updates at this time.   Family History:  Family History  Problem Relation Age of Onset  . Pancreatic cancer Paternal Grandfather   . Cirrhosis Paternal Grandfather   . Pancreatitis Other        cousin  . Throat cancer Mother   . Colon cancer Neg Hx     Social History:  Social History   Social  History  . Marital status: Married    Spouse name: N/A  . Number of children: 0  . Years of education: N/A   Occupational History  . pharmacy tech    Social History Main Topics  . Smoking status: Current Every Day Smoker    Packs/day: 1.50    Years: 20.00    Types: Cigarettes  . Smokeless tobacco: Never Used  . Alcohol use No  . Drug use: No  . Sexual activity: Yes    Birth control/ protection: None   Other Topics Concern  . None   Social History Narrative  . None    Allergies:  Allergies  Allergen Reactions  . Erythromycin  Anaphylaxis, Hives and Nausea Only  . Levofloxacin Other (See Comments)    Burning during administration IV prior to surgery.Pt was told not to take it again.  . Niaspan [Niacin] Anaphylaxis  . Penicillins Anaphylaxis    Has patient had a PCN reaction causing immediate rash, facial/tongue/throat swelling, SOB or lightheadedness with hypotension: Yes Has patient had a PCN reaction causing severe rash involving mucus membranes or skin necrosis: No Has patient had a PCN reaction that required hospitalization: No Has patient had a PCN reaction occurring within the last 10 years: No If all of the above answers are "NO", then may proceed with Cephalosporin use.   . Shellfish Allergy Anaphylaxis  . Sulfa Antibiotics Anaphylaxis    "Blisters in my throat"  . Wellbutrin [Bupropion] Other (See Comments)    Suicidal thoughts while on Lexapro  . Morphine Other (See Comments)    ineffective  . Strawberry Extract Hives  . Sulfamethoxazole Other (See Comments)    Blister in throat, sores in mouth  . Tape Other (See Comments)    Transpore Tape causes skin to peel;  and Nicoderm Adhesive Patch cause blisters  . Doxycycline Hives and Nausea Only    A 100mg  tablet formulation caused hives, nausea, vomiting.  [name] Keflex [Cephalexin] Other (See Comments)    Hallucinations  . Statins Other (See Comments)    Muscle pain  . Valium [Diazepam] Other (See Comments)    Confusion and amnesia   . Vancomycin Hives and Nausea Only  . Zetia [Ezetimibe] Diarrhea  . 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 Morphine And Related Other (See Comments)    ineffective    Metabolic Disorder Labs: Lab Results  Component Value Date   HGBA1C 9.1 (H) 08/19/2014   No results found for: PROLACTIN No results found for: CHOL, TRIG, HDL, CHOLHDL, VLDL, LDLCALC Lab Results  Component Value Date   TSH 2.54 08/19/2014   TSH 0.38 01/29/2014     Therapeutic Level Labs: No results found for: LITHIUM No results found for: VALPROATE No components found for:  CBMZ  Current Medications: Current Outpatient Prescriptions  Medication Sig Dispense Refill  . aspirin EC 325 MG tablet Take 325 mg by mouth daily.    [name] EPIPEN 2-PAK 0.3 MG/0.3ML SOAJ injection Inject 0.3 mg into the skin daily as needed (allergic reaction).     . fenofibrate (TRICOR) 145 MG tablet Take 145 mg by mouth at bedtime.    . gabapentin (NEURONTIN) 300 MG capsule Take 300 mg by mouth 3 (three) times daily.    [name] glucose blood (BAYER CONTOUR NEXT TEST) test strip Use to test blood sugar 7 times daily as instructed. Coordinates with pt's insulin pump. 700 each 3  .  ibuprofen (ADVIL,MOTRIN) 200 MG tablet Take 600 mg by mouth every 6 (six) hours as needed for mild pain.    [name] insulin lispro (HUMALOG) 100 UNIT/ML injection Inject 60 units daily via insulin pump. 20 mL 11  . levothyroxine (SYNTHROID, LEVOTHROID) 25 MCG tablet Take 25 mcg by mouth daily before breakfast.    . MICROLET LANCETS MISC USE AS DIRECTED 100 each 0  . Multiple Vitamins-Minerals (DEKAS PLUS) CAPS Take 2 capsules by mouth daily.    . Multiple Vitamins-Minerals (HM MULTIVITAMIN ADULT GUMMY PO) Take 2 tablets by mouth daily.     . Pancrelipase, Lip-Prot-Amyl, (PERTZYE) 16000 units CPEP Take 32,000 Units by mouth every 2 (two) hours.    . pantoprazole (PROTONIX) 40 MG tablet Take 80 mg by mouth at bedtime.     . traZODone (DESYREL) 50 MG tablet Take 1 tablet (50 mg total) by mouth at bedtime as needed for sleep. 90 tablet 1  . Vitamin D, Ergocalciferol, (DRISDOL) 50000 UNITS CAPS capsule Take 50,000 Units by mouth every Monday, Wednesday, and Friday.     . lurasidone (LATUDA) 40 MG TABS tablet Take 1 tablet (40 mg total) by mouth daily with breakfast. 90 tablet 1   No current facility-administered medications for this visit.      Musculoskeletal: Strength & Muscle Tone: within normal limits Gait &  Station: normal Patient leans: N/A  Psychiatric Specialty Exam: ROS  Blood pressure 124/72, pulse 88, height 5\' 6"  (1.676 m), weight 149 lb 9.6 oz (67.9 kg).Body mass index is 24.15 kg/m.  General Appearance: Casual and Fairly Groomed  Eye Contact:  Fair  Speech:  Clear and Coherent  Volume:  Normal  Mood:  Dysphoric  Affect:  Congruent  Thought Process:  Goal Directed and Descriptions of Associations: Intact  Orientation:  Full (Time, Place, and Person)  Thought Content: Logical   Suicidal Thoughts:  No  Homicidal Thoughts:  No  Memory:  Immediate;   Fair  Judgement:  Fair  Insight:  Fair  Psychomotor Activity:  Normal  Concentration:  Concentration: Fair  Recall:  [employer] of Knowledge: Fair  Language: Fair  Akathisia:  Negative  Handed:  Right  AIMS (if indicated): not done  Assets:  Communication Skills Desire for Improvement Financial Resources/Insurance Housing  ADL's:  Intact  Cognition: WNL  Sleep:  Fair   Screenings:   Assessment and Plan: KARNISHA LEFEBRE is a 41 year old female with depression with mixed features of anxiety and irritability, and depression secondary to severe medical illness.  She has been tried on multiple medications and has had the best response to atypical antipsychotics.  She has had some anxiety/restlessness with abilify and recently self-discontinued.  We have discussed starting Latuda as below and she is agreeable to engage in individual therapy.  She participated in ECT consultation, and agrees with the plan to hold off on ECT at this time.  1. Moderate recurrent major depression (HCC)   2. Insomnia due to other mental disorder   3. Unspecified mood (affective) disorder (HCC)     Status of current problems: unchanged  Labs Ordered: No orders of the defined types were placed in this encounter.   Labs Reviewed: na  Collateral Obtained/Records Reviewed: n/a  Plan:  Hold trazodone and Rozerem Initiate Latuda 20 mg nightly  with dinner x 2 weeks, then increase to 40 mg Return to clinic in 10-12 weeks Patient to initiate individual therapy locally with a provider that she has seen in the past and was  comfortable with  I spent 15 minutes with the patient in direct face-to-face clinical care.  Greater than 50% of this time was spent in counseling and coordination of care with the patient.    Burnard LeighAlexander Arya Alec Mcphee, MD 04/22/2017, 12:03 PM

## 2017-04-27 ENCOUNTER — Ambulatory Visit (HOSPITAL_COMMUNITY)
Admission: RE | Admit: 2017-04-27 | Discharge: 2017-04-27 | Disposition: A | Discharge status: Home / Self Care | Payer: Medicare Other | Source: Ambulatory Visit | Attending: Vascular Surgery | Admitting: Vascular Surgery

## 2017-04-27 ENCOUNTER — Encounter (HOSPITAL_COMMUNITY)
Admission: RE | Disposition: A | Discharge status: Home / Self Care | Payer: Self-pay | Source: Ambulatory Visit | Attending: Vascular Surgery

## 2017-04-27 ENCOUNTER — Telehealth: Payer: Self-pay | Admitting: Vascular Surgery

## 2017-04-27 DIAGNOSIS — Z91013 Allergy to seafood: Secondary | ICD-10-CM | POA: Insufficient documentation

## 2017-04-27 DIAGNOSIS — Z7982 Long term (current) use of aspirin: Secondary | ICD-10-CM | POA: Diagnosis not present

## 2017-04-27 DIAGNOSIS — Z882 Allergy status to sulfonamides status: Secondary | ICD-10-CM | POA: Diagnosis not present

## 2017-04-27 DIAGNOSIS — F17210 Nicotine dependence, cigarettes, uncomplicated: Secondary | ICD-10-CM | POA: Insufficient documentation

## 2017-04-27 DIAGNOSIS — Z9862 Peripheral vascular angioplasty status: Secondary | ICD-10-CM | POA: Diagnosis not present

## 2017-04-27 DIAGNOSIS — E7800 Pure hypercholesterolemia, unspecified: Secondary | ICD-10-CM | POA: Diagnosis not present

## 2017-04-27 DIAGNOSIS — I739 Peripheral vascular disease, unspecified: Secondary | ICD-10-CM | POA: Insufficient documentation

## 2017-04-27 DIAGNOSIS — Z79899 Other long term (current) drug therapy: Secondary | ICD-10-CM | POA: Diagnosis not present

## 2017-04-27 DIAGNOSIS — Z885 Allergy status to narcotic agent status: Secondary | ICD-10-CM | POA: Diagnosis not present

## 2017-04-27 DIAGNOSIS — Z880 Allergy status to penicillin: Secondary | ICD-10-CM | POA: Insufficient documentation

## 2017-04-27 DIAGNOSIS — Z794 Long term (current) use of insulin: Secondary | ICD-10-CM | POA: Insufficient documentation

## 2017-04-27 DIAGNOSIS — E109 Type 1 diabetes mellitus without complications: Secondary | ICD-10-CM | POA: Diagnosis not present

## 2017-04-27 DIAGNOSIS — M79605 Pain in left leg: Secondary | ICD-10-CM | POA: Diagnosis present

## 2017-04-27 HISTORY — PX: ABDOMINAL AORTOGRAM W/LOWER EXTREMITY: CATH118223

## 2017-04-27 LAB — POCT I-STAT, CHEM 8
BUN: <3 mg/dL — ABNORMAL LOW (ref 6–20)
Calcium, Ion: 1.1 mmol/L — ABNORMAL LOW (ref 1.15–1.40)
Chloride: 103 mmol/L (ref 101–111)
Creatinine, Ser: 0.6 mg/dL (ref 0.44–1.00)
Glucose, Bld: 259 mg/dL — ABNORMAL HIGH (ref 65–99)
HCT: 42 % (ref 36.0–46.0)
Hemoglobin: 14.3 g/dL (ref 12.0–15.0)
Potassium: 3.8 mmol/L (ref 3.5–5.1)
Sodium: 139 mmol/L (ref 135–145)
TCO2: 28 mmol/L (ref 22–32)

## 2017-04-27 LAB — GLUCOSE, CAPILLARY
Glucose-Capillary: 237 mg/dL — ABNORMAL HIGH (ref 65–99)
Glucose-Capillary: 229 mg/dL — ABNORMAL HIGH (ref 65–99)

## 2017-04-27 SURGERY — ABDOMINAL AORTOGRAM W/LOWER EXTREMITY
Anesthesia: LOCAL

## 2017-04-27 MED ORDER — DIPHENHYDRAMINE HCL 50 MG/ML IJ SOLN: 25.0000 mg | INTRAMUSCULAR | Status: DC

## 2017-04-27 MED ORDER — SODIUM CHLORIDE 0.9 % IV SOLN
INTRAVENOUS | Status: DC
  Administered 2017-04-27: 08:00:00 via INTRAVENOUS

## 2017-04-27 MED ORDER — SODIUM CHLORIDE 0.9 % WEIGHT BASED INFUSION: 1.0000 mL/kg/h | INTRAVENOUS | Status: DC

## 2017-04-27 MED ORDER — SODIUM CHLORIDE 0.9% FLUSH
3.0000 mL | INTRAVENOUS | Status: DC | PRN
Start: 2017-04-27 — End: 2017-04-27

## 2017-04-27 MED ORDER — MIDAZOLAM HCL 2 MG/2ML IJ SOLN
INTRAMUSCULAR | Status: AC
  Filled 2017-04-27: qty 2

## 2017-04-27 MED ORDER — LIDOCAINE HCL (PF) 1 % IJ SOLN
INTRAMUSCULAR | Status: DC | PRN
  Administered 2017-04-27: 10 mL

## 2017-04-27 MED ORDER — OXYCODONE HCL 5 MG PO TABS: 5.0000 mg | ORAL_TABLET | ORAL | Status: DC | PRN

## 2017-04-27 MED ORDER — HEPARIN (PORCINE) IN NACL 2-0.9 UNIT/ML-% IJ SOLN
INTRAMUSCULAR | Status: AC
Start: 2017-04-27 — End: 2017-04-27
  Filled 2017-04-27: qty 1000

## 2017-04-27 MED ORDER — MIDAZOLAM HCL 2 MG/2ML IJ SOLN
INTRAMUSCULAR | Status: DC | PRN
  Administered 2017-04-27 (×2): 1 mg via INTRAVENOUS

## 2017-04-27 MED ORDER — HYDRALAZINE HCL 20 MG/ML IJ SOLN: 5.0000 mg | INTRAMUSCULAR | Status: DC | PRN

## 2017-04-27 MED ORDER — LABETALOL HCL 5 MG/ML IV SOLN: 10.0000 mg | INTRAVENOUS | Status: DC | PRN

## 2017-04-27 MED ORDER — FENTANYL CITRATE (PF) 100 MCG/2ML IJ SOLN
INTRAMUSCULAR | Status: AC
  Filled 2017-04-27: qty 2

## 2017-04-27 MED ORDER — HEPARIN (PORCINE) IN NACL 2-0.9 UNIT/ML-% IJ SOLN
INTRAMUSCULAR | Status: AC | PRN
  Administered 2017-04-27: 1000 mL via INTRA_ARTERIAL

## 2017-04-27 MED ORDER — LIDOCAINE HCL (PF) 1 % IJ SOLN
INTRAMUSCULAR | Status: AC
  Filled 2017-04-27: qty 30

## 2017-04-27 MED ORDER — FAMOTIDINE IN NACL 20-0.9 MG/50ML-% IV SOLN: 20.0000 mg | INTRAVENOUS | Status: DC

## 2017-04-27 MED ORDER — METHYLPREDNISOLONE SODIUM SUCC 125 MG IJ SOLR: 125.0000 mg | INTRAMUSCULAR | Status: DC

## 2017-04-27 MED ORDER — FENTANYL CITRATE (PF) 100 MCG/2ML IJ SOLN
INTRAMUSCULAR | Status: DC | PRN
  Administered 2017-04-27 (×2): 50 ug via INTRAVENOUS

## 2017-04-27 MED ORDER — SODIUM CHLORIDE 0.9% FLUSH: 3.0000 mL | Freq: Two times a day (BID) | INTRAVENOUS | Status: DC

## 2017-04-27 MED ORDER — IODIXANOL 320 MG/ML IV SOLN
INTRAVENOUS | Status: DC | PRN
  Administered 2017-04-27: 97 mL via INTRAVENOUS

## 2017-04-27 MED ORDER — SODIUM CHLORIDE 0.9 % IV SOLN: 250.0000 mL | INTRAVENOUS | Status: DC | PRN

## 2017-04-27 MED ORDER — HYDROMORPHONE HCL 1 MG/ML IJ SOLN: 0.5000 mg | INTRAMUSCULAR | Status: DC | PRN

## 2017-04-27 SURGICAL SUPPLY — 10 items
CATH OMNI FLUSH 5F 65CM (CATHETERS) ×2 IMPLANT
COVER PRB 48X5XTLSCP FOLD TPE (BAG) ×1 IMPLANT
COVER PROBE 5X48 (BAG) ×1
KIT MICROINTRODUCER STIFF 5F (SHEATH) ×2 IMPLANT
KIT PV (KITS) ×2 IMPLANT
SHEATH PINNACLE 5F 10CM (SHEATH) ×2 IMPLANT
SYR MEDRAD MARK V 150ML (SYRINGE) ×2 IMPLANT
TRANSDUCER W/STOPCOCK (MISCELLANEOUS) ×2 IMPLANT
TRAY PV CATH (CUSTOM PROCEDURE TRAY) ×2 IMPLANT
WIRE BENTSON .035X145CM (WIRE) ×2 IMPLANT

## 2017-04-27 NOTE — Discharge Instructions (Signed)

## 2017-04-27 NOTE — Progress Notes (Signed)
Site area: rt groin fa sheath Site Prior to Removal:  Level 0 Pressure Applied For:  20 minutes Manual:   yes Patient Status During Pull:  stable Post Pull Site:  Level  0 Post Pull Instructions Given:  yes Post Pull Pulses Present:  palpable Dressing Applied:  Gauze and tegaderm Bedrest begins @ 1020 Comments:

## 2017-04-27 NOTE — Telephone Encounter (Signed)
Sched appt 09/30/16; labs at 10:00 and MD at 11:15. Mailed appt letter.

## 2017-04-27 NOTE — H&P (Signed)
   History and Physical Update  The patient was interviewed and re-examined.  The patient's previous History and Physical has been reviewed and is unchanged from previous office visit. Plan for aortogram with possible intervention.  Shreena Baines C. Randie Heinzain, MD [facility] [place] Office: [phone] Pager: [phone]   04/27/2017, 7:52 AM

## 2017-04-27 NOTE — Progress Notes (Signed)
Spoke with Dr Randie Heinzain, he re assessed pt, she is not allergic to contrast. Medication orders were cancelled.

## 2017-04-27 NOTE — Telephone Encounter (Signed)
-----   Message from Sharee PimpleMarilyn K McChesney, RN sent at 04/27/2017  3:10 PM EST ----- Regarding: 6 months   ----- Message ----- From: Maeola Harmanain, Brandon Christopher, MD Sent: 04/27/2017   9:57 AM To: [address]  [name] [id number] Jun 16, 1976    04/27/2017 Pre-operative Diagnosis: left leg pain Post-operative diagnosis:  Same Surgeon:  Apolinar JunesBrandon C. Randie Heinzain, MD Procedure Performed: 1.  US guided cannulation of right common femoral artery 2.  Aortogram with bilateral lower extremity runoff 3.  Moderate sedation with fentanyl and versed for 21 mintues  F/u in 6 months with lle duplex and abi

## 2017-04-27 NOTE — Op Note (Signed)
    Patient name: Jessica FudgeRebecca L [name] MRN: [id number] DOB: [birth year] Sex: female  04/27/2017 Pre-operative Diagnosis: left leg pain Post-operative diagnosis:  Same Surgeon:  Apolinar JunesBrandon C. Randie Heinzain, MD Procedure Performed: 1.  US guided cannulation of right common femoral artery 2.  Aortogram with bilateral lower extremity runoff 3.  Moderate sedation with fentanyl and versed for 21 mintues  Indications:  41 year old female has a history of a left femoral above-knee popliteal artery bypass. This has been intervened upon twice. She now has recurrent left leg pain and is indicated for angiogram possible intervention.  Findings: The aortoiliac segment is narrow however there is no flow-limiting stenosis. The bypass on the left is patent with vessel runoff dominated by the peroneal artery to the foot. The right side there is trace no flow limitation.   Procedure:  The patient was identified in the holding area and taken to room 8.  The patient was then placed supine on the table and prepped and draped in the usual sterile fashion.  A time out was called.  Ultrasound was used to evaluate the right common femoral artery.  It was patent .  A digital ultrasound image was acquired.  A micropuncture needle was used to access the right common femoral artery under ultrasound guidance.  An 018 wire was advanced without resistance and a micropuncture sheath was placed.  The 018 wire was removed and a benson wire was placed.  The micropuncture sheath was exchanged for a 5 french sheath.  An omniflush catheter was advanced over the wire to the level of L-1.  An abdominal angiogram was obtained followed by bilateral lower extremity runoff. With the above findings catheter was then removed over wire. Sheath will be pulled in postop holding. Patient tolerated procedure well without immediate complication.  Contrast: 97cc  Davis Ambrosini C. Randie Heinzain, MD [facility] [place] Office: [phone] Pager:  [phone]

## 2017-04-28 ENCOUNTER — Encounter (HOSPITAL_COMMUNITY): Payer: Self-pay | Admitting: Vascular Surgery

## 2017-06-03 ENCOUNTER — Other Ambulatory Visit (HOSPITAL_COMMUNITY): Payer: Self-pay

## 2017-06-03 ENCOUNTER — Encounter (HOSPITAL_COMMUNITY): Payer: Self-pay

## 2017-06-03 ENCOUNTER — Ambulatory Visit: Payer: Self-pay | Admitting: Vascular Surgery

## 2017-06-29 DIAGNOSIS — A498 Other bacterial infections of unspecified site: Secondary | ICD-10-CM | POA: Insufficient documentation

## 2017-07-15 ENCOUNTER — Ambulatory Visit (HOSPITAL_COMMUNITY): Payer: Self-pay | Admitting: Psychiatry

## 2017-07-20 ENCOUNTER — Ambulatory Visit (INDEPENDENT_AMBULATORY_CARE_PROVIDER_SITE_OTHER): Payer: Medicare Other | Admitting: Psychiatry

## 2017-07-20 ENCOUNTER — Encounter (HOSPITAL_COMMUNITY): Payer: Self-pay | Admitting: Psychiatry

## 2017-07-20 VITALS — BP 122/80 | HR 90 | Ht 66.0 in | Wt 149.0 lb

## 2017-07-20 DIAGNOSIS — Z79899 Other long term (current) drug therapy: Secondary | ICD-10-CM | POA: Diagnosis not present

## 2017-07-20 DIAGNOSIS — F3177 Bipolar disorder, in partial remission, most recent episode mixed: Secondary | ICD-10-CM | POA: Diagnosis not present

## 2017-07-20 DIAGNOSIS — F17210 Nicotine dependence, cigarettes, uncomplicated: Secondary | ICD-10-CM | POA: Diagnosis not present

## 2017-07-20 DIAGNOSIS — E891 Postprocedural hypoinsulinemia: Secondary | ICD-10-CM | POA: Diagnosis not present

## 2017-07-20 DIAGNOSIS — F5105 Insomnia due to other mental disorder: Secondary | ICD-10-CM | POA: Diagnosis not present

## 2017-07-20 DIAGNOSIS — F39 Unspecified mood [affective] disorder: Secondary | ICD-10-CM | POA: Diagnosis not present

## 2017-07-20 DIAGNOSIS — F99 Mental disorder, not otherwise specified: Secondary | ICD-10-CM | POA: Diagnosis not present

## 2017-07-20 DIAGNOSIS — E139 Other specified diabetes mellitus without complications: Secondary | ICD-10-CM | POA: Diagnosis not present

## 2017-07-20 DIAGNOSIS — Z9884 Bariatric surgery status: Secondary | ICD-10-CM | POA: Diagnosis not present

## 2017-07-20 DIAGNOSIS — Z90410 Acquired total absence of pancreas: Secondary | ICD-10-CM

## 2017-07-20 MED ORDER — RISPERIDONE 1 MG PO TABS: 1.0000 mg | ORAL_TABLET | Freq: Two times a day (BID) | ORAL | 1 refills | Status: DC

## 2017-07-20 MED ORDER — LORAZEPAM 2 MG PO TABS: ORAL_TABLET | ORAL | 2 refills | Status: DC

## 2017-07-20 NOTE — Progress Notes (Signed)
BH MD/PA/NP OP Progress Note  07/20/2017 9:03 AM Jessica [name]  MRN:  [id number]  Chief Complaint: Med management, anger, anxiety HPI: Patient presents today with her husband.  Husband corroborates that she continues to be quite angry and can lash out impulsively at him, and yell and scream over what seems to be mild issues.  She acknowledges this as well and feels like her mood is not stable.  She reports that the [place] has been partially beneficial but she continues to struggle with breakthrough anger and anxiety.  She reports that the dose increased from 20-40 mg seems to worsen her irritability.  I spent time with her reviewing her multiple past medication trials on SSRI, and spent time conceptualizing my impression that she likely struggles with a bipolar affective illness.  She was receptive to this.  I educated her on the utility of atypical antipsychotics, and mood stabilizing agents.  I educated her on the risks and benefits of risperidone, and suggested that we switch from [place] to risperidone to target more of the verbal aggression and anger, and for a more potent mood stabilizing regimen.  She was agreeable to this.  I educated her on the risk of akathisia, and instructions to discontinue, educated her on the risk of tardive dyskinesia with prolonged use, and I educated her on the risk of metabolic effects and increase in prolactin resulting in lactation.  She understood the risks and benefits and was agreeable to initiate as discussed.  I also discussed the use of Lorazepam, as a medication to use for anxiety or anger that does not remit with coping strategies.  Discussed 2 mg dosing and she can use 1/2-1 tablet as needed once a day.  I educated her that this is habit forming and she should not use more than his prescribed.  I also educated her that this could be effective if she notices symptoms of akathisia or extrapyramidal side effects.  Visit Diagnosis:    ICD-10-CM   1. Bipolar  disorder, in partial remission, most recent episode mixed (HCC) F31.77 risperiDONE (RISPERDAL) 1 MG tablet  2. 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 Insomnia due to other mental disorder F51.05    F99   4. Unspecified mood (affective) disorder (HCC) F39   5. H/O gastric bypass Z98.84     Past Psychiatric History: See intake H&P for full details. Reviewed, with no updates at this time.   Past Medical History:  Past Medical History:  Diagnosis Date  . ADHD (attention deficit hyperactivity disorder)   . Anxiety   . Diabetes mellitus type I ([place])   . Diverticulosis of colon without hemorrhage   . DM (diabetes mellitus) ([place])    post pancreatectomy  . GERD (gastroesophageal reflux disease)   . Hashimoto's thyroiditis    euthyroid  . Hypercholesterolemia   . PONV (postoperative nausea and vomiting)     Past Surgical History:  Procedure Laterality Date  . ABDOMINAL AORTOGRAM W/LOWER EXTREMITY N/A 04/27/2017   Procedure: ABDOMINAL AORTOGRAM W/LOWER EXTREMITY;  Surgeon: Waynetta Sandy, MD;  Location: [facility];  Service: Cardiovascular;  Laterality: N/A;  . ABDOMINAL HYSTERECTOMY    . BIOPSY N/A 05/15/2014   Procedure: GASTIC,  ASCENDING, AND DESCEDNING/SIGMOID  COLON BIOPSY;  Surgeon: Daneil Dolin, MD;  Location: [facility];  Service: Endoscopy;  Laterality: N/A;  . CHOLECYSTECTOMY    . COLONOSCOPY WITH PROPOFOL N/A 05/15/2014   Dr.  Rourk (primary GI is Dr. Oneida Alar). Hemorrhoids, negative random colon biopsies. GI pathogen panel negative. Diverticulosis.  [name] complete hysterectomy    . ESOPHAGOGASTRODUODENOSCOPY (EGD) WITH PROPOFOL N/A 05/15/2014   Dr. Gala Romney (primary GI Dr. Oneida Alar), Billroth II, inflamed residual gastric mucosa, benign biopsies  . FEMORAL-POPLITEAL BYPASS GRAFT    . 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  [name] KNEE SURGERY Right    X 6-5 arthroscopies and open procedure 1 time- tighten patellar tendon and  loose IT band  . 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    Family Psychiatric History: See intake H&P for full details. Reviewed, with no updates at this time.   Family History:  Family History  Problem Relation Age of Onset  . Pancreatic cancer Paternal Grandfather   . Cirrhosis Paternal Grandfather   . Pancreatitis Other        cousin  . Throat cancer Mother   . Colon cancer Neg Hx     Social History:  Social History   Socioeconomic History  . Marital status: Married    Spouse name: None  . Number of children: 0  . Years of education: None  . Highest education level: None  Social Needs  . Financial resource strain: None  . Food insecurity - worry: None  . Food insecurity - inability: None  . Transportation needs - medical: None  . Transportation needs - non-medical: None  Occupational History  . Occupation: Occupational psychologist  Tobacco Use  . Smoking status: Current Every Day Smoker    Packs/day: 1.50    Years: 20.00    Pack years: 30.00    Types: Cigarettes  . Smokeless tobacco: Never Used  . Tobacco comment: Has cut back to 1 pack a day, not ready to quit  Substance and Sexual Activity  . Alcohol use: No    Alcohol/week: 0.0 oz  . Drug use: No  . Sexual activity: Yes    Birth control/protection: None  Other Topics Concern  . None  Social History Narrative  . None    Allergies:  Allergies  Allergen Reactions  . Erythromycin Anaphylaxis, Hives and Nausea Only  . Levofloxacin Other (See Comments)    Burning during administration IV prior to surgery.Pt was told not to take it again.  . Niaspan [Niacin] Anaphylaxis  . Penicillins Anaphylaxis    Has patient had a PCN reaction causing immediate rash, facial/tongue/throat swelling, SOB or lightheadedness with hypotension: Yes Has patient had a PCN reaction causing severe rash involving mucus membranes or skin necrosis: No Has patient had a PCN reaction that  required hospitalization: No Has patient had a PCN reaction occurring within the last 10 years: No If all of the above answers are "NO", then may proceed with Cephalosporin use.   . Shellfish Allergy Anaphylaxis  . Sulfa Antibiotics Anaphylaxis    "Blisters in my throat"  . Wellbutrin [Bupropion] Other (See Comments)    Suicidal thoughts while on Lexapro  . Morphine Other (See Comments)    ineffective  . Strawberry Extract Hives  . Sulfamethoxazole Other (See Comments)    Blister in throat, sores in mouth  . Tape Other (See Comments)    Transpore Tape causes skin to peel;  and Nicoderm Adhesive Patch cause blisters  . Doxycycline Hives and Nausea Only    A 156m tablet formulation caused hives, nausea, vomiting.  .Marland KitchenKeflex [Cephalexin] Other (See Comments)    Hallucinations  . Statins Other (See Comments)  Muscle pain  . Valium [Diazepam] Other (See Comments)    Confusion and amnesia   . Vancomycin Hives and Nausea Only  . Zetia [Ezetimibe] Diarrhea  . 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 Morphine And Related Other (See Comments)    ineffective    Metabolic Disorder Labs: Lab Results  Component Value Date   HGBA1C 9.1 (H) 08/19/2014   No results found for: PROLACTIN No results found for: CHOL, TRIG, HDL, CHOLHDL, VLDL, LDLCALC Lab Results  Component Value Date   TSH 2.54 08/19/2014   TSH 0.38 01/29/2014    Therapeutic Level Labs: No results found for: LITHIUM No results found for: VALPROATE No components found for:  CBMZ  Current Medications: Current Outpatient Medications  Medication Sig Dispense Refill  . aspirin EC 325 MG tablet Take 325 mg by mouth daily.    [name] EPIPEN 2-PAK 0.3 MG/0.3ML SOAJ injection Inject 0.3 mg into the skin daily as needed (allergic reaction).     . Evolocumab (REPATHA) 140 MG/ML SOSY Inject 140 mg into the skin every 14 (fourteen) days.    .  fenofibrate (TRICOR) 145 MG tablet Take 145 mg by mouth at bedtime.    . gabapentin (NEURONTIN) 300 MG capsule Take 300 mg by mouth 3 (three) times daily.    [name] glucose blood (BAYER CONTOUR NEXT TEST) test strip Use to test blood sugar 7 times daily as instructed. Coordinates with pt's insulin pump. 700 each 3  . ibuprofen (ADVIL,MOTRIN) 200 MG tablet Take 600 mg by mouth every 6 (six) hours as needed for mild pain.    [name] insulin lispro (HUMALOG) 100 UNIT/ML injection Inject 60 units daily via insulin pump. 20 mL 11  . levothyroxine (SYNTHROID, LEVOTHROID) 25 MCG tablet Take 25 mcg by mouth daily before breakfast.    . MICROLET LANCETS MISC USE AS DIRECTED 100 each 0  . Multiple Vitamins-Minerals (DEKAS PLUS) CAPS Take 2 capsules by mouth daily.    . Multiple Vitamins-Minerals (HM MULTIVITAMIN ADULT GUMMY PO) Take 2 tablets by mouth daily.     . Pancrelipase, Lip-Prot-Amyl, (PERTZYE) 16000 units CPEP Take 32,000 Units by mouth every 2 (two) hours.    . pantoprazole (PROTONIX) 40 MG tablet Take 80 mg by mouth at bedtime.     . Vitamin D, Ergocalciferol, (DRISDOL) 50000 UNITS CAPS capsule Take 50,000 Units by mouth every Monday, Wednesday, and Friday.     [name] LORazepam (ATIVAN) 2 MG tablet Take 1/2 -1 tablet once a day as needed for anxiety/irritability or anger 30 tablet 2  . risperiDONE (RISPERDAL) 1 MG tablet Take 1 tablet (1 mg total) by mouth 2 (two) times daily. Start 1 tablet nightly and increase to twice daily in 2 weeks as tolerated 180 tablet 1   No current facility-administered medications for this visit.      Musculoskeletal: Strength & Muscle Tone: within normal limits Gait & Station: normal Patient leans: N/A  Psychiatric Specialty Exam: ROS  Blood pressure 122/80, pulse 90, height 5' 6"  (1.676 m), weight 149 lb (67.6 kg), SpO2 98 %.Body mass index is 24.05 kg/m.  General Appearance: Casual and Fairly Groomed  Eye Contact:  Fair  Speech:  Clear and Coherent  Volume:  Normal   Mood:  Dysphoric and Irritable  Affect:  Congruent  Thought Process:  Goal Directed and Descriptions of Associations: Intact  Orientation:  Full (Time, Place, and Person)  Thought Content:  Logical   Suicidal Thoughts:  No  Homicidal Thoughts:  No  Memory:  Immediate;   Fair  Judgement:  Fair  Insight:  Shallow  Psychomotor Activity:  Normal  Concentration:  Attention Span: Good  Recall:  Good  Fund of Knowledge: Good  Language: Good  Akathisia:  Negative  Handed:  Right  AIMS (if indicated): done  Assets:  Communication Skills Desire for Improvement Financial Resources/Insurance Housing Intimacy Social Support Transportation Vocational/Educational  ADL's:  Intact  Cognition: WNL  Sleep:  Fair   Screenings:   Assessment and Plan:  Jessica [name] presents today for medication management follow-up.  Latuda appeared to provide some benefit at a lower dose in terms of reduction of anxiety, but she continued to struggle with irritability and anger.  Increasing the dose of Latuda was met with a worsening of those symptoms.  My suspicion of a bipolar affective illness is increasing and I spent time educating the patient about the utility of medications such as atypical antipsychotics and mood stabilizing agents.  We agreed to proceed as below with titration of risperidone and discontinue Latuda.  She does not present with any acute suicidality.  She does not present with any acute mania or psychosis, and her presentation is most consistent with mixed anxiety, irritability, depression.  She continues to have mood lability and episodes of verbal aggression towards her husband's.  No unsafe behaviors at home towards herself or others.  I educated her on the risks of risperidone including tardive dyskinesia, metabolic side effects, akathisia, elevated prolactin level, EPS.  We will proceed as below and follow-up in 8 weeks.  Ultimately I would like to assist the patient in transitioning to  a non-atypical antipsychotic mood stabilizing agent, such as Lamictal, given her insulin-dependent diabetes.  She has a substantial number of allergies, so this will be done very slowly in the future if patient is agreeable.  1. Bipolar disorder, in partial remission, most recent episode mixed ([place])   2. 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 Insomnia due to other mental disorder   4. Unspecified mood (affective) disorder ([place])   5. H/O gastric bypass     Status of current problems: unchanged  Labs Ordered: No orders of the defined types were placed in this encounter.   Labs Reviewed: n/a  Collateral Obtained/Records Reviewed: husband present as above  Plan:  Discontinue Latuda given plateau in benefit and worsening of mood at higher dose Initiate risperidone 1 mg nightly, increase to twice daily in 1-2 weeks as tolerated Lorazepam 2 mg tablet daily as needed for anxiety or agitation rtc 8 weeks  I spent 20 minutes with the patient in direct face-to-face clinical care.  Greater than 50% of this time was spent in counseling and coordination of care with the patient.    Aundra Dubin, MD 07/20/2017, 9:03 AM

## 2017-08-01 ENCOUNTER — Encounter (HOSPITAL_COMMUNITY): Payer: Self-pay | Admitting: Psychiatry

## 2017-08-10 ENCOUNTER — Encounter (HOSPITAL_COMMUNITY): Payer: Self-pay | Admitting: Psychiatry

## 2017-08-11 ENCOUNTER — Encounter (HOSPITAL_COMMUNITY): Payer: Self-pay | Admitting: Psychiatry

## 2017-08-12 ENCOUNTER — Other Ambulatory Visit (HOSPITAL_COMMUNITY): Payer: Self-pay | Admitting: Psychiatry

## 2017-08-12 DIAGNOSIS — F3177 Bipolar disorder, in partial remission, most recent episode mixed: Secondary | ICD-10-CM

## 2017-08-12 MED ORDER — LORAZEPAM 1 MG PO TABS: 1.0000 mg | ORAL_TABLET | Freq: Two times a day (BID) | ORAL | 0 refills | Status: DC | PRN

## 2017-08-23 NOTE — Progress Notes
Appointment Date: Future Appointments  Date Time Provider Department Center   08/25/2017 3:15 PM Smitty Knudsenennis, James W, MD [facility]   10/04/2017 3:00 PM Sievert, Worthy RancherPaul A, MD [facility]     PCP: Harlen LabsPrice, James W    Chief Complaint: NPA / PVD (peripheral vascular disease)    Allergies: Allergies not on file    Pharmacy: No Pharmacies Listed    Checked By: Ronnell FreshwaterAleesa Matthew, MA    Date: 08/23/2017

## 2017-08-25 ENCOUNTER — Ambulatory Visit: Attending: Surgery

## 2017-08-25 DIAGNOSIS — I739 Peripheral vascular disease, unspecified: Principal | ICD-10-CM

## 2017-08-25 DIAGNOSIS — E119 Type 2 diabetes mellitus without complications: Principal | ICD-10-CM

## 2017-08-25 MED ORDER — LEVOTHYROXINE SODIUM 75 MCG PO TABS
75 ug | Freq: Every day | ORAL
Start: 2017-08-25 — End: ?

## 2017-08-25 MED ORDER — TAB-A-VITE PO TABS
2 | ORAL_TABLET | Freq: Every day | ORAL
Start: 2017-08-25 — End: ?

## 2017-08-25 MED ORDER — LORAZEPAM 2 MG PO TABS
2 mg | ORAL | PRN
Start: 2017-08-25 — End: ?

## 2017-08-25 MED ORDER — GABAPENTIN 300 MG PO CAPS
300 mg | Freq: Three times a day (TID) | ORAL
Start: 2017-08-25 — End: ?

## 2017-08-25 MED ORDER — INSULIN LISPRO 100 UNIT/ML SC SOLN
1.2 [IU] | SUBCUTANEOUS
Start: 2017-08-25 — End: ?

## 2017-08-25 MED ORDER — REPATHA SURECLICK SC
140 mg | SUBCUTANEOUS
Start: 2017-08-25 — End: ?

## 2017-08-25 MED ORDER — PANCRELIPASE (LIP-PROT-AMYL) 36000 UNITS PO CPEP
ORAL_CAPSULE | Freq: Three times a day (TID)
Start: 2017-08-25 — End: ?

## 2017-08-25 MED ORDER — RISPERIDONE 1 MG PO TABS
1 mg | Freq: Two times a day (BID) | ORAL
Start: 2017-08-25 — End: ?

## 2017-08-25 MED ORDER — FENOFIBRATE 145 MG PO TABS
145 mg | Freq: Every evening | ORAL
Start: 2017-08-25 — End: ?

## 2017-08-30 ENCOUNTER — Inpatient Hospital Stay: Admit: 2017-08-30 | Discharge: 2017-08-31

## 2017-08-30 DIAGNOSIS — I739 Peripheral vascular disease, unspecified: Principal | ICD-10-CM

## 2017-08-30 DIAGNOSIS — I743 Embolism and thrombosis of arteries of the lower extremities: Secondary | ICD-10-CM

## 2017-08-30 NOTE — Progress Notes
2+ fem B  1+ DP on left  1+ pedal on right   Pulmonary/Chest: Effort normal and breath sounds normal.   Abdominal: Soft. Bowel sounds are normal.   Musculoskeletal: She exhibits no edema, tenderness or deformity.   Neurological: She is alert and oriented to person, place, and time.     This patient is pleasant appearing, in no acute distress.  Assessment and Plan:    Early significant PAD    Recommendations: Will start with Seg Dopplers as hx not typical and palp DP on left  RTC 2 weeks

## 2017-08-30 NOTE — Progress Notes
Reason for visit:    New Patient (PVD (peripheral vascular disease))        HPI:    Abigail Nelson is a new 42 y.o. White female seen at the request of her primary care physician, Harlen LabsPrice, James W, MD for evaluation of PAD  Moved here from [place]  Had two stents placed in SFA 2017 which failed in 3 months  Had left fem-pop bypass done 2018 with vein AK pop  Has recurrent claudication both legs  Last AG November 2018 which showed patent graft but perineal runoff only     Past Medical History:   Diagnosis Date   ? Diabetes mellitus (CMS-HCC code)      Past Surgical History:   Procedure Laterality Date   ? CHOLECYSTECTOMY     ? FEMORAL BYPASS     ? HYSTERECTOMY     ? KNEE SURGERY Bilateral    ? PANCREAS SURGERY     ? SPLENECTOMY, TOTAL       Family History   Problem Relation Age of Onset   ? Cancer Mother    ? Peripheral Nerve Disease Mother    ? Osteoporosis Father    ? Thyroid Disease Sister    ? Thyroid Disease Sister      Social History     Social History   ? Marital status: Married     Spouse name: N/A   ? Number of children: N/A   ? Years of education: N/A     Occupational History   ? Not on file.     Social History Main Topics   ? Smoking status: Current Every Day Smoker     Packs/day: 1.00     Years: 30.00   ? Smokeless tobacco: Never Used   ? Alcohol use No   ? Drug use: No   ? Sexual activity: Yes     Partners: Male     Other Topics Concern   ? Not on file     Social History Narrative   ? No narrative on file       Current Med List   Medication Sig   ? Evolocumab (REPATHA SURECLICK SC) Inject 140 mg into the skin every 2 weeks.   ? fenofibrate (TRICOR) 145 MG PO Tablet Take 145 mg by mouth nightly at bedtime.   ? gabapentin (NEURONTIN) 300 MG PO Capsule Take 300 mg by mouth 3 times daily.   ? insulin lispro (HumaLOG) vial Inject 1.2 Units into the skin continuous.   ? levothyroxine (SYNTHROID, LEVOTHROID) 75 MCG PO Tablet Take 75 mcg by mouth daily.

## 2017-08-30 NOTE — Progress Notes
Review of Systems   Constitutional: Positive for malaise/fatigue.   HENT: Negative.    Eyes: Negative.    Respiratory: Negative.    Cardiovascular: Positive for claudication.   Gastrointestinal: Positive for abdominal pain, diarrhea and vomiting.   Genitourinary: Negative.    Musculoskeletal: Negative.    Skin: Negative.    Neurological: Negative.    Endo/Heme/Allergies: Negative.    Psychiatric/Behavioral: Positive for depression. The patient is nervous/anxious.

## 2017-08-30 NOTE — Progress Notes
?   LORazepam (ATIVAN) 2 MG PO Tablet Take 2 mg by mouth as needed for anxiety.   ? multivitamin PO Tablet Take 2 tablets by mouth daily.   ? pancrelipase (lipase-protease-amylase) (CREON) 36000 units PO Capsule Delayed Release Particles 3 times daily.   ? risperiDONE (RisperDAL) 1 MG PO Tablet Take 1 mg by mouth 2 times daily.       Allergies   Allergen Reactions   ? Penicillins Anaphylaxis   ? Doxycycline Hives and Nausea And Vomiting     100 mg ONLY - coating on pill   ? Erythromycin Other (See Comments)     Unknown   ? Keflex [Cephalexin] Other (See Comments)     Hallucinations   ? Levaquin [Levofloxacin] Other (See Comments)     Unknown   ? Morphine Other (See Comments)     Unknown   ? Statins [Hmg-Coa-R Inhibitors] Other (See Comments)     Muscle aches, pain   ? Sulfa Drugs Other (See Comments)     Sores in throat   ? Valium [Diazepam] Other (See Comments)     Hypersensitive   ? Wellbutrin [Bupropion] Other (See Comments)     Suicidal thoughts       ROS:    Review of Systems   Constitutional: Positive for malaise/fatigue.   Eyes: Negative.    Respiratory: Negative.    Cardiovascular: Positive for claudication. Negative for chest pain and palpitations.   Gastrointestinal: Negative.    Genitourinary: Negative.    Musculoskeletal: Positive for myalgias.   Skin: Negative.    Neurological: Negative.    Endo/Heme/Allergies: Negative.        Vitals:      Vitals:    08/25/17 1518   BP: 140/83   Pulse: 85   Resp: 18   Temp: 36.7 ?C (98.1 ?F)   Weight: 67.6 kg (149 lb)   Height: 1.676 m (5\' 6" )       Physical Exam:    Physical Exam   Constitutional: She is oriented to person, place, and time. She appears well-developed and well-nourished.   HENT:   Head: Normocephalic.   Right Ear: External ear normal.   Left Ear: External ear normal.   Eyes: Pupils are equal, round, and reactive to light. EOM are normal.   Neck: Normal range of motion. Neck supple.   No bruits   Cardiovascular: Normal rate and regular rhythm.

## 2017-09-06 ENCOUNTER — Encounter (HOSPITAL_COMMUNITY): Payer: Self-pay | Admitting: Psychiatry

## 2017-09-07 NOTE — Progress Notes
Appointment Date: Future Appointments  Date Time Provider Department Center   09/08/2017 1:30 PM Smitty Knudsenennis, James W, MD [facility]   10/04/2017 3:00 PM Sievert, Worthy RancherPaul A, MD [facility]     PCP: Harlen LabsPrice, James W    Chief Complaint: EPA / 2 week f/u - Discuss U/S results    Allergies:   Allergies   Allergen Reactions   ? Penicillins Anaphylaxis   ? Doxycycline Hives and Nausea And Vomiting     100 mg ONLY - coating on pill   ? Erythromycin Other (See Comments)     Unknown   ? Keflex [Cephalexin] Other (See Comments)     Hallucinations   ? Levaquin [Levofloxacin] Other (See Comments)     Unknown   ? Morphine Other (See Comments)     Unknown   ? Statins [Hmg-Coa-R Inhibitors] Other (See Comments)     Muscle aches, pain   ? Sulfa Drugs Other (See Comments)     Sores in throat   ? Valium [Diazepam] Other (See Comments)     Hypersensitive   ? Wellbutrin [Bupropion] Other (See Comments)     Suicidal thoughts     Radiology:*EPIC* 08/30/17 - US Segmental Doppler    Pharmacy:   [facility] [id number] [place] [address] [place] [address] [facility] [address]  [address]  [place] [id number]  Phone: [phone] Fax: [phone]      Checked By: Ronnell FreshwaterAleesa Matthew, MA    Date: 09/07/2017

## 2017-09-08 ENCOUNTER — Ambulatory Visit: Attending: Surgery

## 2017-09-08 DIAGNOSIS — I739 Peripheral vascular disease, unspecified: Principal | ICD-10-CM

## 2017-09-08 DIAGNOSIS — E119 Type 2 diabetes mellitus without complications: Principal | ICD-10-CM

## 2017-09-08 MED ORDER — CILOSTAZOL 100 MG PO TABS
50 mg | Freq: Two times a day (BID) | ORAL | 5 refills | Status: CP
Start: 2017-09-08 — End: ?

## 2017-09-08 NOTE — Progress Notes
Pt with hx left SFA stents x 2 plus fem pop bypass in [place]  Recurrent mild claud B    Palp distal pulses  Seg Doppers right 0.80, left 0.96    PE unchanged    Will observe for now  RTC 6 months   Try pletal for claud

## 2017-09-08 NOTE — Progress Notes
color change: No  pallor: No  Wound: No  Food Allergies: No  Immunocompromised (unable to fight infection): No  Difficulty speaking: No  Fainting: No  numbness: No  Facial asymmetry: No  light-headedness: No  andenopathy: No  Behavioral Problem: No  Confusion: No  Decreased concentration : No  Feeling unhappy: No  Self Injury: No  Agitation: No  Hyperactive: No  Sleep Disturbance: No

## 2017-09-08 NOTE — Progress Notes
Review of Systems   Constitutional: Negative for chills, diaphoresis and fever.   HENT: Negative for congestion, ear discharge, ear pain, hearing loss, nosebleeds, sore throat and tinnitus.    Eyes: Negative for photophobia, pain, discharge and redness.   Respiratory: Negative for cough, shortness of breath, wheezing and stridor.    Cardiovascular: Positive for claudication and leg swelling. Negative for chest pain and palpitations.   Gastrointestinal: Positive for abdominal pain, diarrhea, nausea and vomiting. Negative for blood in stool and constipation.   Genitourinary: Negative for dysuria, flank pain, frequency, hematuria and urgency.   Musculoskeletal: Negative for back pain, myalgias and neck pain.   Skin: Negative for rash.   Neurological: Positive for headaches. Negative for dizziness, tremors, seizures and weakness.   Endo/Heme/Allergies: Negative for environmental allergies and polydipsia. Does not bruise/bleed easily.   Psychiatric/Behavioral: Negative for hallucinations and suicidal ideas. The patient is not nervous/anxious.        Answers for HPI/ROS submitted by the patient on 09/01/2017   Change in activity: No  Change in appetite: No  fatigue: No  Weight Change: No  Dental Problem: No  facial swelling: No  drooling: No  mouth sores: No  postnasal drip: No  rhinorrhea: No  sinus pressure: No  sneezing: No  voice change: No  trouble swallowing: No  visual disturbance: No  Eye Itching: No  Breathing stops temporarily (apnea): No  Chest Tightness: No  Choking: No  Swollen Abdomen: No  anal bleeding: No  Rectal Pain: No  Cold Intolerance: Yes  Heat Intolerance: Yes  polyphagia: No  Inability to control urine: No  Menstrual Problem: No  Decreased Urine Amount: No  Difficulty Urinating: No  polyuria: No  Vaginal Bleeding: No  Vaginal Discharge: No  Vaginal Pain: No  genital lesions: No  Painful Sex: No  pelvic pain: No  Difficulty walking: No  arthralgias: No  Joint swelling: No  Neck stiffness: No

## 2017-09-14 ENCOUNTER — Ambulatory Visit (HOSPITAL_COMMUNITY): Payer: Medicare Other | Admitting: Psychiatry

## 2017-09-30 ENCOUNTER — Encounter (HOSPITAL_COMMUNITY): Payer: Self-pay

## 2017-09-30 ENCOUNTER — Ambulatory Visit: Payer: Self-pay | Admitting: Vascular Surgery

## 2017-10-04 ENCOUNTER — Ambulatory Visit: Attending: Internal Medicine

## 2017-10-04 DIAGNOSIS — I739 Peripheral vascular disease, unspecified: Secondary | ICD-10-CM

## 2017-10-04 DIAGNOSIS — E119 Type 2 diabetes mellitus without complications: Secondary | ICD-10-CM

## 2017-10-04 DIAGNOSIS — K8689 Other specified diseases of pancreas: Secondary | ICD-10-CM

## 2017-10-04 DIAGNOSIS — R1084 Generalized abdominal pain: Secondary | ICD-10-CM

## 2017-10-04 DIAGNOSIS — R197 Diarrhea, unspecified: Secondary | ICD-10-CM

## 2017-10-04 DIAGNOSIS — E063 Autoimmune thyroiditis: Secondary | ICD-10-CM

## 2017-10-04 DIAGNOSIS — E7800 Pure hypercholesterolemia, unspecified: Secondary | ICD-10-CM

## 2017-10-04 MED ORDER — PANCRELIPASE (LIP-PROT-AMYL) 16000 UNITS PO CPEP
ORAL_CAPSULE
Start: 2017-10-04 — End: 2017-10-05

## 2017-10-04 MED ORDER — CREON 36000 UNITS PO CPEP
3 | ORAL_CAPSULE | Freq: Four times a day (QID) | ORAL | 3 refills | Status: CP
Start: 2017-10-04 — End: 2017-12-27

## 2017-10-10 ENCOUNTER — Ambulatory Visit (HOSPITAL_COMMUNITY): Payer: Medicare Other | Admitting: Psychiatry

## 2017-10-11 DIAGNOSIS — K8689 Other specified diseases of pancreas: Secondary | ICD-10-CM

## 2017-10-11 DIAGNOSIS — R197 Diarrhea, unspecified: Secondary | ICD-10-CM

## 2017-10-11 DIAGNOSIS — R1084 Generalized abdominal pain: Principal | ICD-10-CM

## 2017-10-21 ENCOUNTER — Ambulatory Visit: Payer: Medicare Other | Admitting: Vascular Surgery

## 2017-10-21 ENCOUNTER — Encounter (HOSPITAL_COMMUNITY): Payer: Self-pay

## 2017-10-25 DIAGNOSIS — R197 Diarrhea, unspecified: Secondary | ICD-10-CM

## 2017-10-25 DIAGNOSIS — R1084 Generalized abdominal pain: Principal | ICD-10-CM

## 2017-10-25 DIAGNOSIS — K8689 Other specified diseases of pancreas: Secondary | ICD-10-CM

## 2017-10-26 ENCOUNTER — Inpatient Hospital Stay: Admit: 2017-10-26 | Discharge: 2017-10-27

## 2017-10-26 DIAGNOSIS — R1084 Generalized abdominal pain: Secondary | ICD-10-CM

## 2017-10-26 DIAGNOSIS — Z9081 Acquired absence of spleen: Secondary | ICD-10-CM

## 2017-10-26 DIAGNOSIS — T797XXA Traumatic subcutaneous emphysema, initial encounter: Secondary | ICD-10-CM

## 2017-10-26 DIAGNOSIS — K8689 Other specified diseases of pancreas: Secondary | ICD-10-CM

## 2017-10-26 DIAGNOSIS — Z9049 Acquired absence of other specified parts of digestive tract: Secondary | ICD-10-CM

## 2017-10-26 DIAGNOSIS — I708 Atherosclerosis of other arteries: Secondary | ICD-10-CM

## 2017-10-26 DIAGNOSIS — M479 Spondylosis, unspecified: Secondary | ICD-10-CM

## 2017-10-26 DIAGNOSIS — R197 Diarrhea, unspecified: Secondary | ICD-10-CM

## 2017-10-26 DIAGNOSIS — R109 Unspecified abdominal pain: Principal | ICD-10-CM

## 2017-10-26 DIAGNOSIS — I70201 Unspecified atherosclerosis of native arteries of extremities, right leg: Secondary | ICD-10-CM

## 2017-10-26 DIAGNOSIS — I700 Atherosclerosis of aorta: Secondary | ICD-10-CM

## 2017-10-26 DIAGNOSIS — K551 Chronic vascular disorders of intestine: Secondary | ICD-10-CM

## 2017-10-26 DIAGNOSIS — Z9884 Bariatric surgery status: Secondary | ICD-10-CM

## 2017-10-26 DIAGNOSIS — T82898A Other specified complication of vascular prosthetic devices, implants and grafts, initial encounter: Secondary | ICD-10-CM

## 2017-10-26 MED ORDER — SODIUM CHLORIDE 0.9% FOR FLUSHES
20-180 mL | INTRAVENOUS | Status: CP | PRN
Start: 2017-10-26 — End: ?

## 2017-10-26 MED ORDER — IOHEXOL 350 MG/ML IV SOLN SH
100 mL | Freq: Once | INTRAVENOUS | Status: CP
Start: 2017-10-26 — End: ?

## 2017-12-06 ENCOUNTER — Ambulatory Visit (INDEPENDENT_AMBULATORY_CARE_PROVIDER_SITE_OTHER): Payer: Medicare Other | Admitting: Psychiatry

## 2017-12-06 ENCOUNTER — Encounter (HOSPITAL_COMMUNITY): Payer: Self-pay | Admitting: Psychiatry

## 2017-12-06 VITALS — BP 112/64 | HR 74 | Ht 66.0 in | Wt 141.0 lb

## 2017-12-06 DIAGNOSIS — F3177 Bipolar disorder, in partial remission, most recent episode mixed: Secondary | ICD-10-CM

## 2017-12-06 DIAGNOSIS — F411 Generalized anxiety disorder: Secondary | ICD-10-CM

## 2017-12-06 MED ORDER — CLONAZEPAM 1 MG PO TABS: 1.0000 mg | ORAL_TABLET | Freq: Two times a day (BID) | ORAL | 2 refills | Status: DC

## 2017-12-06 MED ORDER — RISPERIDONE 3 MG PO TABS: 3.0000 mg | ORAL_TABLET | Freq: Every day | ORAL | 0 refills | Status: DC

## 2017-12-06 NOTE — Progress Notes (Signed)
BH MD/PA/NP OP Progress Note  12/06/2017 12:31 PM Jessica [name]  MRN:  [id number]  Chief Complaint: Med management, anger, anxiety  HPI: Jessica [name] reports that her mood and depression symptoms are much better on the risperidone 3 mg nightly.  I reiterated the risk of TD and confirmed with the patient that she is not having any symptoms of akathisia or movements in her fingers or hands or face that are abnormal or bothersome.  She and husband will continue to watch for these.  She is sleeping very well at night.  She continues to have anxiety and nervousness throughout the day and feels like the Ativan wears away too quickly.  We agreed to switch her from Ativan twice a day to clonazepam twice a day, and reviewed the risks of habituation, sedation, falls.  She denies any unsafe thoughts or behaviors towards others and feels encouraged that risperidone has helped.  We will follow-up in 6-8 weeks or sooner if needed.   Her husband is at the visit and able to corroborate that she has had significant improvements in her mood and tearfulness and overall depression symptoms.  Visit Diagnosis:    ICD-10-CM   1. GAD (generalized anxiety disorder) F41.1   2. Bipolar disorder, in partial remission, most recent episode mixed (HCC) F31.77 risperiDONE (RISPERDAL) 3 MG tablet    clonazePAM (KLONOPIN) 1 MG tablet    Past Psychiatric History: See intake H&P for full details. Reviewed, with no updates at this time.   Past Medical History:  Past Medical History:  Diagnosis Date  . ADHD (attention deficit hyperactivity disorder)   . Anxiety   . Diabetes mellitus type I (HCC)   . Diverticulosis of colon without hemorrhage   . DM (diabetes mellitus) (HCC)    post pancreatectomy  . GERD (gastroesophageal reflux disease)   . Hashimoto's thyroiditis    euthyroid  . Hypercholesterolemia   . PONV (postoperative nausea and vomiting)     Past Surgical History:  Procedure Laterality Date  . ABDOMINAL  AORTOGRAM W/LOWER EXTREMITY N/A 04/27/2017   Procedure: ABDOMINAL AORTOGRAM W/LOWER EXTREMITY;  Surgeon: Maeola Harman, MD;  Location: [facility] INVASIVE CV LAB;  Service: Cardiovascular;  Laterality: N/A;  . ABDOMINAL HYSTERECTOMY    . BIOPSY N/A 05/15/2014   Procedure: GASTIC,  ASCENDING, AND DESCEDNING/SIGMOID  COLON BIOPSY;  Surgeon: Corbin Ade, MD;  Location: [facility];  Service: Endoscopy;  Laterality: N/A;  . CHOLECYSTECTOMY    . COLONOSCOPY WITH PROPOFOL N/A 05/15/2014   Dr. Jena Gauss (primary GI is Dr. Darrick Penna). Hemorrhoids, negative random colon biopsies. GI pathogen panel negative. Diverticulosis.  [name] complete hysterectomy    . ESOPHAGOGASTRODUODENOSCOPY (EGD) WITH PROPOFOL N/A 05/15/2014   Dr. Jena Gauss (primary GI Dr. Darrick Penna), Billroth II, inflamed residual gastric mucosa, benign biopsies  . FEMORAL-POPLITEAL BYPASS GRAFT    . 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  [name] KNEE SURGERY Right    X 6-5 arthroscopies and open procedure 1 time- tighten patellar tendon and loose IT band  . 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    Family Psychiatric History: See intake H&P for full details. Reviewed, with no updates at this time.   Family History:  Family History  Problem Relation Age of Onset  . Pancreatic cancer Paternal Grandfather   . Cirrhosis Paternal Grandfather   . Pancreatitis Other        cousin  . Throat cancer Mother   . Colon cancer Neg Hx  Social History:  Social History   Socioeconomic History  . Marital status: Married    Spouse name: Not on file  . Number of children: 0  . Years of education: Not on file  . Highest education level: Not on file  Occupational History  . Occupation: Associate Professorpharmacy tech  Social Needs  . Financial resource strain: Not on file  . Food insecurity:    Worry: Not on file    Inability: Not on file  . Transportation needs:    Medical: Not on file    Non-medical: Not on file  Tobacco  Use  . Smoking status: Current Every Day Smoker    Packs/day: 1.50    Years: 20.00    Pack years: 30.00    Types: Cigarettes  . Smokeless tobacco: Never Used  . Tobacco comment: Has cut back to 1 pack a day, not ready to quit  Substance and Sexual Activity  . Alcohol use: No    Alcohol/week: 0.0 oz  . Drug use: No  . Sexual activity: Yes    Birth control/protection: None  Lifestyle  . Physical activity:    Days per week: Not on file    Minutes per session: Not on file  . Stress: Not on file  Relationships  . Social connections:    Talks on phone: Not on file    Gets together: Not on file    Attends religious service: Not on file    Active member of club or organization: Not on file    Attends meetings of clubs or organizations: Not on file    Relationship status: Not on file  Other Topics Concern  . Not on file  Social History Narrative  . Not on file    Allergies:  Allergies  Allergen Reactions  . Erythromycin Anaphylaxis, Hives and Nausea Only  . Levofloxacin Other (See Comments)    Burning during administration IV prior to surgery.Pt was told not to take it again.  . Niaspan [Niacin] Anaphylaxis  . Penicillins Anaphylaxis    Has patient had a PCN reaction causing immediate rash, facial/tongue/throat swelling, SOB or lightheadedness with hypotension: Yes Has patient had a PCN reaction causing severe rash involving mucus membranes or skin necrosis: No Has patient had a PCN reaction that required hospitalization: No Has patient had a PCN reaction occurring within the last 10 years: No If all of the above answers are "NO", then may proceed with Cephalosporin use.   . Shellfish Allergy Anaphylaxis  . Sulfa Antibiotics Anaphylaxis    "Blisters in my throat"  . Wellbutrin [Bupropion] Other (See Comments)    Suicidal thoughts while on Lexapro  . Morphine Other (See Comments)    ineffective  . Strawberry Extract Hives  . Sulfamethoxazole Other (See Comments)     Blister in throat, sores in mouth  . Tape Other (See Comments)    Transpore Tape causes skin to peel;  and Nicoderm Adhesive Patch cause blisters  . Doxycycline Hives and Nausea Only    A 100mg  tablet formulation caused hives, nausea, vomiting.  [name]. Keflex [Cephalexin] Other (See Comments)    Hallucinations  . Statins Other (See Comments)    Muscle pain  . Valium [Diazepam] Other (See Comments)    Confusion and amnesia   . Vancomycin Hives and Nausea Only  . Zetia [Ezetimibe] Diarrhea  . 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 Morphine And Related  Other (See Comments)    ineffective    Metabolic Disorder Labs: Lab Results  Component Value Date   HGBA1C 9.1 (H) 08/19/2014   No results found for: PROLACTIN No results found for: CHOL, TRIG, HDL, CHOLHDL, VLDL, LDLCALC Lab Results  Component Value Date   TSH 2.54 08/19/2014   TSH 0.38 01/29/2014    Therapeutic Level Labs: No results found for: LITHIUM No results found for: VALPROATE No components found for:  CBMZ  Current Medications: Current Outpatient Medications  Medication Sig Dispense Refill  . aspirin EC 325 MG tablet Take 325 mg by mouth daily.    [name] EPIPEN 2-PAK 0.3 MG/0.3ML SOAJ injection Inject 0.3 mg into the skin daily as needed (allergic reaction).     . Evolocumab (REPATHA) 140 MG/ML SOSY Inject 140 mg into the skin every 14 (fourteen) days.    . fenofibrate (TRICOR) 145 MG tablet Take 145 mg by mouth at bedtime.    . gabapentin (NEURONTIN) 300 MG capsule Take 300 mg by mouth 3 (three) times daily.    [name] glucose blood (BAYER CONTOUR NEXT TEST) test strip Use to test blood sugar 7 times daily as instructed. Coordinates with pt's insulin pump. 700 each 3  . ibuprofen (ADVIL,MOTRIN) 200 MG tablet Take 600 mg by mouth every 6 (six) hours as needed for mild pain.    [name] insulin lispro (HUMALOG) 100 UNIT/ML injection Inject 60 units daily  via insulin pump. 20 mL 11  . levothyroxine (SYNTHROID, LEVOTHROID) 25 MCG tablet Take 25 mcg by mouth daily before breakfast.    . MICROLET LANCETS MISC USE AS DIRECTED 100 each 0  . Multiple Vitamins-Minerals (DEKAS PLUS) CAPS Take 2 capsules by mouth daily.    . Multiple Vitamins-Minerals (HM MULTIVITAMIN ADULT GUMMY PO) Take 2 tablets by mouth daily.     . Pancrelipase, Lip-Prot-Amyl, (PERTZYE) 16000 units CPEP Take 32,000 Units by mouth every 2 (two) hours.    . pantoprazole (PROTONIX) 40 MG tablet Take 80 mg by mouth at bedtime.     . risperiDONE (RISPERDAL) 3 MG tablet Take 1 tablet (3 mg total) by mouth at bedtime. 90 tablet 0  . Vitamin D, Ergocalciferol, (DRISDOL) 50000 UNITS CAPS capsule Take 50,000 Units by mouth every Monday, Wednesday, and Friday.     . clonazePAM (KLONOPIN) 1 MG tablet Take 1 tablet (1 mg total) by mouth 2 (two) times daily. 60 tablet 2   No current facility-administered medications for this visit.      Musculoskeletal: Strength & Muscle Tone: within normal limits Gait & Station: normal Patient leans: N/A  Psychiatric Specialty Exam: ROS  Blood pressure 112/64, pulse 74, height 5\' 6"  (1.676 m), weight 141 lb (64 kg).Body mass index is 22.76 kg/m.  General Appearance: Casual and Fairly Groomed  Eye Contact:  Fair  Speech:  Clear and Coherent  Volume:  Normal  Mood:  Anxious and Mood is getting better  Affect:  Congruent  Thought Process:  Goal Directed and Descriptions of Associations: Intact  Orientation:  Full (Time, Place, and Person)  Thought Content: Logical   Suicidal Thoughts:  No  Homicidal Thoughts:  No  Memory:  Immediate;   Fair  Judgement:  Fair  Insight:  Shallow  Psychomotor Activity:  Normal  Concentration:  Attention Span: Good  Recall:  Good  Fund of Knowledge: Good  Language: Good  Akathisia:  Negative  Handed:  Right  AIMS (if indicated): done  Assets:  Communication Skills Desire for Improvement Financial  Resources/Insurance  Housing Intimacy Social Support Transportation Vocational/Educational  ADL's:  Intact  Cognition: WNL  Sleep:  Fair   Screenings:   Assessment and Plan:  Jessica [name] presents today for medication management follow-up.  She is feeling much better on the risperidone, and we have weighed the risks and benefits of continuing at this current dose and she would like to remain on risperidone 3 mg nightly for now.  We also agreed to transition her from Ativan to clonazepam for a more long-acting benefit given that her anxiety symptoms continue frequently throughout the day.  She does not present with any suicidality or unsafe behaviors towards others.  Her depression symptoms have began to improve and her husband is able to corroborate this.  I spent time with the patient providing psychoeducation and expressing my concern that her trajectory and presentation is likely looking more like a bipolar spectrum of illness, and I encouraged her to read more about bipolar depression.  Patient is aware that writer is transitioning out of clinic and we have agreed to continue her care at [facility] behavioral health after writer's departure at the end of August.  1. GAD (generalized anxiety disorder)   2. Bipolar disorder, in partial remission, most recent episode mixed (HCC)    Status of current problems: unchanged  Labs Ordered: No orders of the defined types were placed in this encounter.   Labs Reviewed: n/a  Collateral Obtained/Records Reviewed: husband present as above  Plan:  Risperidone 3 mg nightly Clonazepam 1 mg twice a day rtc 8 weeks  I spent 20 minutes with the patient in direct face-to-face clinical care.  Greater than 50% of this time was spent in counseling and coordination of care with the patient.    Burnard Leigh, MD 12/06/2017, 12:31 PM

## 2017-12-20 ENCOUNTER — Encounter (HOSPITAL_COMMUNITY): Payer: Self-pay | Admitting: Psychiatry

## 2017-12-23 ENCOUNTER — Other Ambulatory Visit: Payer: Self-pay

## 2017-12-23 DIAGNOSIS — I739 Peripheral vascular disease, unspecified: Secondary | ICD-10-CM

## 2017-12-27 DIAGNOSIS — K8689 Other specified diseases of pancreas: Principal | ICD-10-CM

## 2017-12-27 MED ORDER — CREON 36000 UNITS PO CPEP
3 | ORAL_CAPSULE | Freq: Four times a day (QID) | ORAL | 3 refills | Status: CP
Start: 2017-12-27 — End: ?

## 2018-01-06 ENCOUNTER — Ambulatory Visit (HOSPITAL_COMMUNITY)
Admission: RE | Admit: 2018-01-06 | Discharge: 2018-01-06 | Disposition: A | Discharge status: Home / Self Care | Payer: Medicare Other | Source: Ambulatory Visit | Attending: Vascular Surgery | Admitting: Vascular Surgery

## 2018-01-06 ENCOUNTER — Ambulatory Visit (INDEPENDENT_AMBULATORY_CARE_PROVIDER_SITE_OTHER)
Admission: RE | Admit: 2018-01-06 | Discharge: 2018-01-06 | Disposition: A | Discharge status: Home / Self Care | Payer: Medicare Other | Source: Ambulatory Visit | Attending: Vascular Surgery | Admitting: Vascular Surgery

## 2018-01-06 ENCOUNTER — Ambulatory Visit (INDEPENDENT_AMBULATORY_CARE_PROVIDER_SITE_OTHER): Payer: Medicare Other | Admitting: Vascular Surgery

## 2018-01-06 ENCOUNTER — Other Ambulatory Visit: Payer: Self-pay

## 2018-01-06 ENCOUNTER — Encounter: Payer: Self-pay | Admitting: Vascular Surgery

## 2018-01-06 VITALS — BP 126/87 | HR 78 | Temp 99.1°F | Resp 16 | Ht 66.0 in | Wt 145.0 lb

## 2018-01-06 DIAGNOSIS — I739 Peripheral vascular disease, unspecified: Secondary | ICD-10-CM | POA: Diagnosis present

## 2018-01-06 DIAGNOSIS — Z9889 Other specified postprocedural states: Secondary | ICD-10-CM | POA: Insufficient documentation

## 2018-01-06 NOTE — Progress Notes (Signed)
Patient ID: Jessica [name], female   DOB: 1976-01-01, 42 y.o.   MRN: [id number]  Reason for Consult: PAD (6 mth f/u ABI, LE Art L. )   Referred by Joaquin Courts, DO  Subjective:     HPI:  Jessica [name] is a 42 y.o. female with history of left femoropopliteal bypass and outside institution.  She subsequent he had a few endovascular interventions.  I recently performed angiogram did no intervention.  Her pain is mostly in her right leg at this time which is cramping in her right calf with any activity.  She is frustrated by this.  She does have a history of knee replacement.  She continues to smoke daily.  She does take aspirin daily.  She also complains of swelling in her left lower extremity.  Past Medical History:  Diagnosis Date  . ADHD (attention deficit hyperactivity disorder)   . Anxiety   . Diabetes mellitus type I (HCC)   . Diverticulosis of colon without hemorrhage   . DM (diabetes mellitus) (HCC)    post pancreatectomy  . GERD (gastroesophageal reflux disease)   . Hashimoto's thyroiditis    euthyroid  . Hypercholesterolemia   . PONV (postoperative nausea and vomiting)    Family History  Problem Relation Age of Onset  . Pancreatic cancer Paternal Grandfather   . Cirrhosis Paternal Grandfather   . Pancreatitis Other        cousin  . Throat cancer Mother   . Colon cancer Neg Hx    Past Surgical History:  Procedure Laterality Date  . ABDOMINAL AORTOGRAM W/LOWER EXTREMITY N/A 04/27/2017   Procedure: ABDOMINAL AORTOGRAM W/LOWER EXTREMITY;  Surgeon: Maeola Harman, MD;  Location: [facility] INVASIVE CV LAB;  Service: Cardiovascular;  Laterality: N/A;  . ABDOMINAL HYSTERECTOMY    . BIOPSY N/A 05/15/2014   Procedure: GASTIC,  ASCENDING, AND DESCEDNING/SIGMOID  COLON BIOPSY;  Surgeon: Corbin Ade, MD;  Location: [facility];  Service: Endoscopy;  Laterality: N/A;  . CHOLECYSTECTOMY    . COLONOSCOPY WITH PROPOFOL N/A 05/15/2014   Dr. Jena Gauss (primary GI is Dr.  Darrick Penna). Hemorrhoids, negative random colon biopsies. GI pathogen panel negative. Diverticulosis.  [name] complete hysterectomy    . ESOPHAGOGASTRODUODENOSCOPY (EGD) WITH PROPOFOL N/A 05/15/2014   Dr. Jena Gauss (primary GI Dr. Darrick Penna), Billroth II, inflamed residual gastric mucosa, benign biopsies  . FEMORAL-POPLITEAL BYPASS GRAFT    . 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  [name] KNEE SURGERY Right    X 6-5 arthroscopies and open procedure 1 time- tighten patellar tendon and loose IT band  . 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    Short Social History:  Social History   Tobacco Use  . Smoking status: Current Every Day Smoker    Packs/day: 1.50    Years: 20.00    Pack years: 30.00    Types: Cigarettes  . Smokeless tobacco: Never Used  . Tobacco comment: Has cut back to 1 pack a day, not ready to quit  Substance Use Topics  . Alcohol use: No    Alcohol/week: 0.0 oz    Allergies  Allergen Reactions  . Erythromycin Anaphylaxis, Hives and Nausea Only  . Levofloxacin Other (See Comments)    Burning during administration IV prior to surgery.Pt was told not to take it again.  . Niaspan [Niacin] Anaphylaxis  . Penicillins Anaphylaxis    Has patient had a PCN reaction causing immediate rash, facial/tongue/throat swelling, SOB or lightheadedness with hypotension: Yes  Has patient had a PCN reaction causing severe rash involving mucus membranes or skin necrosis: No Has patient had a PCN reaction that required hospitalization: No Has patient had a PCN reaction occurring within the last 10 years: No If all of the above answers are "NO", then may proceed with Cephalosporin use.   . Shellfish Allergy Anaphylaxis  . Sulfa Antibiotics Anaphylaxis    "Blisters in my throat"  . Wellbutrin [Bupropion] Other (See Comments)    Suicidal thoughts while on Lexapro  . Morphine Other (See Comments)    ineffective  . Strawberry Extract Hives  .  Sulfamethoxazole Other (See Comments)    Blister in throat, sores in mouth  . Tape Other (See Comments)    Transpore Tape causes skin to peel;  and Nicoderm Adhesive Patch cause blisters  . Doxycycline Hives and Nausea Only    A 100mg  tablet formulation caused hives, nausea, vomiting.  [name] Keflex [Cephalexin] Other (See Comments)    Hallucinations  . Statins Other (See Comments)    Muscle pain  . Valium [Diazepam] Other (See Comments)    Confusion and amnesia   . Vancomycin Hives and Nausea Only  . Zetia [Ezetimibe] Diarrhea  . 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 Morphine And Related Other (See Comments)    ineffective    Current Outpatient Medications  Medication Sig Dispense Refill  . aspirin EC 325 MG tablet Take 325 mg by mouth daily.    . clonazePAM (KLONOPIN) 1 MG tablet Take 1 tablet (1 mg total) by mouth 2 (two) times daily. 60 tablet 2  . EPIPEN 2-PAK 0.3 MG/0.3ML SOAJ injection Inject 0.3 mg into the skin daily as needed (allergic reaction).     . Evolocumab (REPATHA) 140 MG/ML SOSY Inject 140 mg into the skin every 14 (fourteen) days.    . fenofibrate (TRICOR) 145 MG tablet Take 145 mg by mouth at bedtime.    . gabapentin (NEURONTIN) 300 MG capsule Take 300 mg by mouth 3 (three) times daily.    [name] glucose blood (BAYER CONTOUR NEXT TEST) test strip Use to test blood sugar 7 times daily as instructed. Coordinates with pt's insulin pump. 700 each 3  . ibuprofen (ADVIL,MOTRIN) 200 MG tablet Take 600 mg by mouth every 6 (six) hours as needed for mild pain.    [name] insulin lispro (HUMALOG) 100 UNIT/ML injection Inject 60 units daily via insulin pump. 20 mL 11  . levothyroxine (SYNTHROID, LEVOTHROID) 25 MCG tablet Take 25 mcg by mouth daily before breakfast.    . MICROLET LANCETS MISC USE AS DIRECTED 100 each 0  . Multiple Vitamins-Minerals (DEKAS PLUS) CAPS Take 2 capsules by mouth daily.    .  Multiple Vitamins-Minerals (HM MULTIVITAMIN ADULT GUMMY PO) Take 2 tablets by mouth daily.     . Pancrelipase, Lip-Prot-Amyl, (PERTZYE) 16000 units CPEP Take 32,000 Units by mouth every 2 (two) hours.    . pantoprazole (PROTONIX) 40 MG tablet Take 80 mg by mouth at bedtime.     . risperiDONE (RISPERDAL) 3 MG tablet Take 1 tablet (3 mg total) by mouth at bedtime. 90 tablet 0  . Vitamin D, Ergocalciferol, (DRISDOL) 50000 UNITS CAPS capsule Take 50,000 Units by mouth every Monday, Wednesday, and Friday.      No current facility-administered medications for this visit.     Review of Systems  Constitutional:  Constitutional negative. HENT: HENT negative.  Eyes:  Eyes negative.  Respiratory: Respiratory negative.  Cardiovascular: Positive for claudication and leg swelling.  GI: Gastrointestinal negative.  Musculoskeletal: Positive for leg pain.  Skin: Skin negative.  Neurological: Neurological negative. Hematologic: Hematologic/lymphatic negative.  Psychiatric: Psychiatric negative.        Objective:  Objective   Vitals:   01/06/18 1539  BP: 126/87  Pulse: 78  Resp: 16  Temp: 99.1 F (37.3 C)  TempSrc: Oral  SpO2: 96%  Weight: 145 lb (65.8 kg)  Height: 5\' 6"  (1.676 m)   Body mass index is 23.4 kg/m.  Physical Exam  Constitutional: She is oriented to person, place, and time. She appears well-developed.  HENT:  Head: Normocephalic.  Eyes: Pupils are equal, round, and reactive to light.  Neck: Normal range of motion.  Cardiovascular: Normal rate.  Pulses:      Radial pulses are 2+ on the right side, and 2+ on the left side.       Popliteal pulses are 2+ on the right side, and 2+ on the left side.       Dorsalis pedis pulses are 2+ on the right side, and 2+ on the left side.  Pulmonary/Chest: Effort normal.  Abdominal: Soft.  Musculoskeletal: Normal range of motion. She exhibits edema.  Neurological: She is alert and oriented to person, place, and time.  Skin: Skin is  warm and dry.  Psychiatric: She has a normal mood and affect. Her behavior is normal. Judgment and thought content normal.    Data: I have independently interpreted her bilateral lower extremity ABIs 0.9 right 1.05 left both triphasic.  Toe pressure right 98 left 125.  I have also interpreted her graft duplex which demonstrates triphasic waveforms throughout.     Assessment/Plan:     42 year old female with left femoropopliteal bypass on the left and has had several interventions on this.  This time the bypass is patent.  She has pain in her right lower extremity that I think is not related to any vascular issues.  She is okay to wear compression stockings on her left leg where she has chronic swelling that is bothersome.  Her vein on the left has been harvested so she does not have any room for venous intervention on that side.  She demonstrates good understanding we will have her follow-up in 1 year with repeat studies.     Maeola HarmanBrandon Christopher Anahy Esh MD [facility]

## 2018-01-10 ENCOUNTER — Ambulatory Visit (INDEPENDENT_AMBULATORY_CARE_PROVIDER_SITE_OTHER): Payer: Medicare Other | Admitting: Psychiatry

## 2018-01-10 ENCOUNTER — Encounter (HOSPITAL_COMMUNITY): Payer: Self-pay | Admitting: Psychiatry

## 2018-01-10 VITALS — BP 116/72 | HR 78 | Ht 66.0 in | Wt 144.0 lb

## 2018-01-10 DIAGNOSIS — F3177 Bipolar disorder, in partial remission, most recent episode mixed: Secondary | ICD-10-CM | POA: Diagnosis not present

## 2018-01-10 DIAGNOSIS — F99 Mental disorder, not otherwise specified: Secondary | ICD-10-CM | POA: Diagnosis not present

## 2018-01-10 DIAGNOSIS — F17210 Nicotine dependence, cigarettes, uncomplicated: Secondary | ICD-10-CM

## 2018-01-10 DIAGNOSIS — F411 Generalized anxiety disorder: Secondary | ICD-10-CM | POA: Diagnosis not present

## 2018-01-10 DIAGNOSIS — F5105 Insomnia due to other mental disorder: Secondary | ICD-10-CM | POA: Diagnosis not present

## 2018-01-10 MED ORDER — CLONAZEPAM 1 MG PO TABS: 0.5000 mg | ORAL_TABLET | Freq: Three times a day (TID) | ORAL | 1 refills | Status: DC | PRN

## 2018-01-10 NOTE — Progress Notes (Signed)
[facility] MD/PA/NP OP Progress Note  01/10/2018 11:20 AM Jessica [name]  MRN:  [id number]  Chief Complaint: Med management, anger, anxiety  HPI: Jessica [name] reports that she feels much better with the combination of risperidone and clonazepam.  She feels like the 1 mg dose was too high so she only takes about a quarter of it twice a day, and takes the full tablet at night.  She reports that she sleeps very well at night, and the risperidone seems to be at a good place in terms of tolerability and providing benefit.  She is going to be starting individual therapy locally this week.  No acute safety concerns, and her husband is present and corroborates that she has been doing much better in terms of her mood.  We agreed to continue risperidone and decrease the dose of clonazepam to 0.5 mg to use 3 times a day as needed.  She will follow-up in 2-3 months with a new provider in office, as Clinical research associate is departing clinic at the end of August.  Visit Diagnosis:    ICD-10-CM   1. Insomnia due to other mental disorder F51.05    F99   2. Bipolar disorder, in partial remission, most recent episode mixed (HCC) F31.77 clonazePAM (KLONOPIN) 1 MG tablet  3. GAD (generalized anxiety disorder) F41.1     Past Psychiatric History: See intake H&P for full details. Reviewed, with no updates at this time.   Past Medical History:  Past Medical History:  Diagnosis Date  . ADHD (attention deficit hyperactivity disorder)   . Anxiety   . Diabetes mellitus type I (HCC)   . Diverticulosis of colon without hemorrhage   . DM (diabetes mellitus) (HCC)    post pancreatectomy  . GERD (gastroesophageal reflux disease)   . Hashimoto's thyroiditis    euthyroid  . Hypercholesterolemia   . PONV (postoperative nausea and vomiting)     Past Surgical History:  Procedure Laterality Date  . ABDOMINAL AORTOGRAM W/LOWER EXTREMITY N/A 04/27/2017   Procedure: ABDOMINAL AORTOGRAM W/LOWER EXTREMITY;  Surgeon: Maeola Harman,  MD;  Location: [facility] INVASIVE CV LAB;  Service: Cardiovascular;  Laterality: N/A;  . ABDOMINAL HYSTERECTOMY    . BIOPSY N/A 05/15/2014   Procedure: GASTIC,  ASCENDING, AND DESCEDNING/SIGMOID  COLON BIOPSY;  Surgeon: Corbin Ade, MD;  Location: [facility];  Service: Endoscopy;  Laterality: N/A;  . CHOLECYSTECTOMY    . COLONOSCOPY WITH PROPOFOL N/A 05/15/2014   Dr. Jena Gauss (primary GI is Dr. Darrick Penna). Hemorrhoids, negative random colon biopsies. GI pathogen panel negative. Diverticulosis.  [name] complete hysterectomy    . ESOPHAGOGASTRODUODENOSCOPY (EGD) WITH PROPOFOL N/A 05/15/2014   Dr. Jena Gauss (primary GI Dr. Darrick Penna), Billroth II, inflamed residual gastric mucosa, benign biopsies  . FEMORAL-POPLITEAL BYPASS GRAFT    . 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  [name] KNEE SURGERY Right    X 6-5 arthroscopies and open procedure 1 time- tighten patellar tendon and loose IT band  . 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    Family Psychiatric History: See intake H&P for full details. Reviewed, with no updates at this time.   Family History:  Family History  Problem Relation Age of Onset  . Pancreatic cancer Paternal Grandfather   . Cirrhosis Paternal Grandfather   . Pancreatitis Other        cousin  . Throat cancer Mother   . Colon cancer Neg Hx     Social History:  Social History   Socioeconomic  History  . Marital status: Married    Spouse name: Not on file  . Number of children: 0  . Years of education: Not on file  . Highest education level: Not on file  Occupational History  . Occupation: Associate Professorpharmacy tech  Social Needs  . Financial resource strain: Not on file  . Food insecurity:    Worry: Not on file    Inability: Not on file  . Transportation needs:    Medical: Not on file    Non-medical: Not on file  Tobacco Use  . Smoking status: Current Every Day Smoker    Packs/day: 1.50    Years: 20.00    Pack years: 30.00    Types: Cigarettes   . Smokeless tobacco: Never Used  . Tobacco comment: Has cut back to 1 pack a day, not ready to quit  Substance and Sexual Activity  . Alcohol use: No    Alcohol/week: 0.0 oz  . Drug use: No  . Sexual activity: Yes    Birth control/protection: None  Lifestyle  . Physical activity:    Days per week: Not on file    Minutes per session: Not on file  . Stress: Not on file  Relationships  . Social connections:    Talks on phone: Not on file    Gets together: Not on file    Attends religious service: Not on file    Active member of club or organization: Not on file    Attends meetings of clubs or organizations: Not on file    Relationship status: Not on file  Other Topics Concern  . Not on file  Social History Narrative  . Not on file    Allergies:  Allergies  Allergen Reactions  . Erythromycin Anaphylaxis, Hives and Nausea Only  . Levofloxacin Other (See Comments)    Burning during administration IV prior to surgery.Pt was told not to take it again.  . Niaspan [Niacin] Anaphylaxis  . Penicillins Anaphylaxis    Has patient had a PCN reaction causing immediate rash, facial/tongue/throat swelling, SOB or lightheadedness with hypotension: Yes Has patient had a PCN reaction causing severe rash involving mucus membranes or skin necrosis: No Has patient had a PCN reaction that required hospitalization: No Has patient had a PCN reaction occurring within the last 10 years: No If all of the above answers are "NO", then may proceed with Cephalosporin use.   . Shellfish Allergy Anaphylaxis  . Sulfa Antibiotics Anaphylaxis    "Blisters in my throat"  . Wellbutrin [Bupropion] Other (See Comments)    Suicidal thoughts while on Lexapro  . Morphine Other (See Comments)    ineffective  . Strawberry Extract Hives  . Sulfamethoxazole Other (See Comments)    Blister in throat, sores in mouth  . Tape Other (See Comments)    Transpore Tape causes skin to peel;  and Nicoderm Adhesive Patch  cause blisters  . Doxycycline Hives and Nausea Only    A 100mg  tablet formulation caused hives, nausea, vomiting.  [name]. Keflex [Cephalexin] Other (See Comments)    Hallucinations  . Statins Other (See Comments)    Muscle pain  . Valium [Diazepam] Other (See Comments)    Confusion and amnesia   . Vancomycin Hives and Nausea Only  . Zetia [Ezetimibe] Diarrhea  . 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 Morphine And Related Other (See Comments)    ineffective  Metabolic Disorder Labs: Lab Results  Component Value Date   HGBA1C 9.1 (H) 08/19/2014   No results found for: PROLACTIN No results found for: CHOL, TRIG, HDL, CHOLHDL, VLDL, LDLCALC Lab Results  Component Value Date   TSH 2.54 08/19/2014   TSH 0.38 01/29/2014    Therapeutic Level Labs: No results found for: LITHIUM No results found for: VALPROATE No components found for:  CBMZ  Current Medications: Current Outpatient Medications  Medication Sig Dispense Refill  . aspirin EC 325 MG tablet Take 325 mg by mouth daily.    . clonazePAM (KLONOPIN) 1 MG tablet Take 0.5 tablets (0.5 mg total) by mouth 3 (three) times daily as needed for anxiety. 90 tablet 1  . EPIPEN 2-PAK 0.3 MG/0.3ML SOAJ injection Inject 0.3 mg into the skin daily as needed (allergic reaction).     . Evolocumab (REPATHA) 140 MG/ML SOSY Inject 140 mg into the skin every 14 (fourteen) days.    . fenofibrate (TRICOR) 145 MG tablet Take 145 mg by mouth at bedtime.    . gabapentin (NEURONTIN) 300 MG capsule Take 300 mg by mouth 3 (three) times daily.    [name] glucose blood (BAYER CONTOUR NEXT TEST) test strip Use to test blood sugar 7 times daily as instructed. Coordinates with pt's insulin pump. 700 each 3  . ibuprofen (ADVIL,MOTRIN) 200 MG tablet Take 600 mg by mouth every 6 (six) hours as needed for mild pain.    [name] insulin lispro (HUMALOG) 100 UNIT/ML injection Inject 60 units  daily via insulin pump. 20 mL 11  . levothyroxine (SYNTHROID, LEVOTHROID) 25 MCG tablet Take 25 mcg by mouth daily before breakfast.    . Multiple Vitamins-Minerals (DEKAS PLUS) CAPS Take 2 capsules by mouth daily.    . Multiple Vitamins-Minerals (HM MULTIVITAMIN ADULT GUMMY PO) Take 2 tablets by mouth daily.     . Pancrelipase, Lip-Prot-Amyl, (PERTZYE) 16000 units CPEP Take 32,000 Units by mouth every 2 (two) hours.    . pantoprazole (PROTONIX) 40 MG tablet Take 80 mg by mouth at bedtime.     . risperiDONE (RISPERDAL) 3 MG tablet Take 1 tablet (3 mg total) by mouth at bedtime. 90 tablet 0  . Vitamin D, Ergocalciferol, (DRISDOL) 50000 UNITS CAPS capsule Take 50,000 Units by mouth every Monday, Wednesday, and Friday.     [name] MICROLET LANCETS MISC USE AS DIRECTED 100 each 0   No current facility-administered medications for this visit.     Musculoskeletal: Strength & Muscle Tone: within normal limits Gait & Station: normal Patient leans: N/A  Psychiatric Specialty Exam: ROS  Blood pressure 116/72, pulse 78, height 5\' 6"  (1.676 m), weight 144 lb (65.3 kg), SpO2 99 %.Body mass index is 23.24 kg/m.  General Appearance: Casual, Well Groomed and more neat and groomed  Eye Contact:  Fair  Speech:  Clear and Coherent  Volume:  Normal  Mood:  Euthymic and doing better  Affect:  Congruent  Thought Process:  Goal Directed and Descriptions of Associations: Intact  Orientation:  Full (Time, Place, and Person)  Thought Content: Logical   Suicidal Thoughts:  No  Homicidal Thoughts:  No  Memory:  Immediate;   Fair  Judgement:  Fair  Insight:  Fair and improving  Psychomotor Activity:  Normal  Concentration:  Attention Span: Good  Recall:  Good  Fund of Knowledge: Good  Language: Good  Akathisia:  Negative  Handed:  Right  AIMS (if indicated): done  Assets:  Communication Skills Desire for Improvement Financial Resources/Insurance Housing  Intimacy Social  Cabin crew  ADL's:  Intact  Cognition: WNL  Sleep:  Good   Screenings:   Assessment and Plan:  Jessica [name] presents with improvements in her mood and anxiety with the combination of risperidone and clonazepam.  She is also going to be starting individual therapy this week locally with an agency near her home.  She does not present with any unsafe thoughts and has been able to handle external stressors much better.  Her husband is able to corroborate her progress and improvements.  1. Insomnia due to other mental disorder   2. Bipolar disorder, in partial remission, most recent episode mixed (HCC)   3. GAD (generalized anxiety disorder)    Status of current problems: unchanged  Labs Ordered: No orders of the defined types were placed in this encounter.   Labs Reviewed: n/a  Collateral Obtained/Records Reviewed: husband present as above  Plan:  Risperidone 3 mg nightly Clonazepam 0.25 mg BID and 1 mg QHS  (0.5 mg TID dosing) rtc 10-12 weeks   Burnard Leigh, MD 01/10/2018, 11:20 AM

## 2018-02-07 DIAGNOSIS — R072 Precordial pain: Secondary | ICD-10-CM | POA: Insufficient documentation

## 2018-02-09 DIAGNOSIS — R918 Other nonspecific abnormal finding of lung field: Secondary | ICD-10-CM | POA: Insufficient documentation

## 2018-04-13 NOTE — Progress Notes (Signed)
REVIEWED-NO ADDITIONAL RECOMMENDATIONS. 

## 2018-04-27 ENCOUNTER — Ambulatory Visit (HOSPITAL_COMMUNITY): Payer: Self-pay | Admitting: Psychiatry

## 2018-06-02 DIAGNOSIS — E039 Hypothyroidism, unspecified: Secondary | ICD-10-CM | POA: Insufficient documentation

## 2018-07-17 NOTE — Progress Notes (Addendum)
Psychiatric Initial Adult Assessment   Patient Identification: Jessica [name] MRN:  [id number] Date of Evaluation:  07/18/2018 Referral Source:Grisetti Jomarie Longs, NP-C Chief Complaint:   Visit Diagnosis:    ICD-10-CM   1. Current mild episode of major depressive disorder without prior episode (HCC) F32.0   2. Insomnia, unspecified type G47.00     History of Present Illness:   Jessica [name] is a 43 y.o. year old female with a history of bipolar disorder, hashimoto's thyroiditis, AIH/Hereditary pancreatitis s/p islet cell transplant, diabetes, s/p Roux-en- Y bypass surgery, left lower extremity bypass with a subsequent balloon angioplasty, who is referred for bipolar I disorder.   Patient states that she has been struggling with fatigue.  She also reports that although her "brain goes 1000 pmh, but my body only goes 15 mph."  She has been unemployed since she had surgery due to pancreatitis.  She used to handle a couple of jobs, which includes Adult nurse, owning a business.  Although she used to be able to do things and be active completing things, which occurred to her mind, she has been feeling frustrated that she has not been able to do so.  She also feels fatigue.  Although she may enjoy reading a book, playing billiard, or riding a motorcycle, she does not have good energy otherwise.  She also talks about her mother, who was diagnosed with terminal lung cancer, already this month.  She has mixed feeling of taking care of her mother, stating that she was "not the favorite" among her siblings.  Although she has had tried many medication in the past, many of the medication makes her feel irritable.  Although she is taking Ativan for sleep, she is concerned about it as she has heard of its potential side effect of dependence.   She has initial and middle insomnia.  She feels fatigue.  She denies feeling depression.  She has difficulty in concentration.  She feels anxious and has  occasional panic attacks.   Bipolar- Patient states that she does not think she has bipolar disorder. She denies mood swing.  She may feels irritable at times.  She had decreased need for sleep for 3 days.  She denies increasing goal-directed behavior, although she may impulsively do shopping at times within her budget.  She has racing thoughts. (She has decreased libido).  She denies talkativeness. She denies AH, VH, paranoia.   ADHD-she was diagnosed with ADHD as a child, and was on Ritalin until ninth grade.  She was advised by her mother to stop the medication.  Although she denies any issues while she was working, she currently struggles with multitasking, completing tasks.  She is forgetful and misplaces things.  She has difficulty in staying in the topic during the conversation with other people.   Substance-she rarely drinks alcohol.  She denies drug use.   Jody, her husband presents to the interview.  He states that the patient appears to struggle with fatigue.  He denies any history consistent with (hypo)mania.  Per PMP.   On hydrocodone. Lorazepam 1 mg BID filled on 06/24/2018   Associated Signs/Symptoms: Depression Symptoms:  insomnia, fatigue, panic attacks, (Hypo) Manic Symptoms:  Flight of Ideas, Irritable Mood, Anxiety Symptoms:  Panic Symptoms, Psychotic Symptoms:  denies AH, VH PTSD Symptoms: Negative  Past Psychiatric History:  Outpatient: diagnosed with ADHD, was on ritalin until 9th grade. Seen by Dr. Rene Kocher before. Per chart review, the patient was referred to ECT, which was  declined due to its potential risk Psychiatry admission: in 07/2014 at [facility] after started on Wellbutrin Previous suicide attempt: denies  Past trials of medication: sertraline, fluoxetine, Paxil, citalopram, lexapro, bupropion (voices), venlafaxine, duloxetine, trintellix, mirtazapine, trazodone, amitriptyline, lamotrigine, Geodon, olanzapine, Vraylar, xanax, clonazepam, ritalin,  trazodone, Ambien, Rozerem, lunesta, Belsomra, benadryl History of violence: denies   Previous Psychotropic Medications: Yes   Substance Abuse History in the last 12 months:  No.  Consequences of Substance Abuse: NA  Past Medical History:  Past Medical History:  Diagnosis Date  . ADHD (attention deficit hyperactivity disorder)   . Anxiety   . Diabetes mellitus type I (HCC)   . Diverticulosis of colon without hemorrhage   . DM (diabetes mellitus) (HCC)    post pancreatectomy  . GERD (gastroesophageal reflux disease)   . Hashimoto's thyroiditis    euthyroid  . Hypercholesterolemia   . PONV (postoperative nausea and vomiting)     Past Surgical History:  Procedure Laterality Date  . ABDOMINAL AORTOGRAM W/LOWER EXTREMITY N/A 04/27/2017   Procedure: ABDOMINAL AORTOGRAM W/LOWER EXTREMITY;  Surgeon: Maeola Harman, MD;  Location: [facility] INVASIVE CV LAB;  Service: Cardiovascular;  Laterality: N/A;  . ABDOMINAL HYSTERECTOMY    . BIOPSY N/A 05/15/2014   Procedure: GASTIC,  ASCENDING, AND DESCEDNING/SIGMOID  COLON BIOPSY;  Surgeon: Corbin Ade, MD;  Location: [facility] ORS;  Service: Endoscopy;  Laterality: N/A;  . CHOLECYSTECTOMY    . COLONOSCOPY WITH PROPOFOL N/A 05/15/2014   Dr. Jena Gauss (primary GI is Dr. Darrick Penna). Hemorrhoids, negative random colon biopsies. GI pathogen panel negative. Diverticulosis.  [name] complete hysterectomy    . ESOPHAGOGASTRODUODENOSCOPY (EGD) WITH PROPOFOL N/A 05/15/2014   Dr. Jena Gauss (primary GI Dr. Darrick Penna), Billroth II, inflamed residual gastric mucosa, benign biopsies  . FEMORAL-POPLITEAL BYPASS GRAFT    . 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  [name] KNEE SURGERY Right    X 6-5 arthroscopies and open procedure 1 time- tighten patellar tendon and loose IT band  . 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    Family Psychiatric History: denies    Family History:  Family History  Problem Relation Age of Onset  .  Pancreatic cancer Paternal Grandfather   . Cirrhosis Paternal Grandfather   . Pancreatitis Other        cousin  . Throat cancer Mother   . Colon cancer Neg Hx     Social History:   Social History   Socioeconomic History  . Marital status: Married    Spouse name: Not on file  . Number of children: 0  . Years of education: Not on file  . Highest education level: Not on file  Occupational History  . Occupation: Associate Professor  Social Needs  . Financial resource strain: Not on file  . Food insecurity:    Worry: Not on file    Inability: Not on file  . Transportation needs:    Medical: Not on file    Non-medical: Not on file  Tobacco Use  . Smoking status: Current Every Day Smoker    Packs/day: 1.50    Years: 20.00    Pack years: 30.00    Types: Cigarettes  . Smokeless tobacco: Never Used  . Tobacco comment: Has cut back to 1 pack a day, not ready to quit  Substance and Sexual Activity  . Alcohol use: No    Alcohol/week: 0.0 standard drinks  . Drug use: No  . Sexual activity: Yes    Birth  control/protection: None  Lifestyle  . Physical activity:    Days per week: Not on file    Minutes per session: Not on file  . Stress: Not on file  Relationships  . Social connections:    Talks on phone: Not on file    Gets together: Not on file    Attends religious service: Not on file    Active member of club or organization: Not on file    Attends meetings of clubs or organizations: Not on file    Relationship status: Not on file  Other Topics Concern  . Not on file  Social History Narrative  . Not on file    Additional Social History:  Married for 12 years, no children She grew up in many places as her father was in [place]. She has closer relationship with her father. She thinks that her mother could have supported the patient better, stating that she was "not the favorite." Education: graduated from college, majoring in paramedic (She was on ritalin until 9th  grade)  Allergies:   Allergies  Allergen Reactions  . Erythromycin Anaphylaxis, Hives and Nausea Only  . Levofloxacin Other (See Comments)    Burning during administration IV prior to surgery.Pt was told not to take it again.  . Niaspan [Niacin] Anaphylaxis  . Penicillins Anaphylaxis    Has patient had a PCN reaction causing immediate rash, facial/tongue/throat swelling, SOB or lightheadedness with hypotension: Yes Has patient had a PCN reaction causing severe rash involving mucus membranes or skin necrosis: No Has patient had a PCN reaction that required hospitalization: No Has patient had a PCN reaction occurring within the last 10 years: No If all of the above answers are "NO", then may proceed with Cephalosporin use.   . Shellfish Allergy Anaphylaxis  . Sulfa Antibiotics Anaphylaxis    "Blisters in my throat"  . Wellbutrin [Bupropion] Other (See Comments)    Suicidal thoughts while on Lexapro  . Morphine Other (See Comments)    ineffective  . Strawberry Extract Hives  . Sulfamethoxazole Other (See Comments)    Blister in throat, sores in mouth  . Tape Other (See Comments)    Transpore Tape causes skin to peel;  and Nicoderm Adhesive Patch cause blisters  . Doxycycline Hives and Nausea Only    A 100mg  tablet formulation caused hives, nausea, vomiting.  [name] Keflex [Cephalexin] Other (See Comments)    Hallucinations  . Statins Other (See Comments)    Muscle pain  . Valium [Diazepam] Other (See Comments)    Confusion and amnesia   . Vancomycin Hives and Nausea Only  . Zetia [Ezetimibe] Diarrhea  . 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 Morphine And Related Other (See Comments)    ineffective    Metabolic Disorder Labs: Lab Results  Component Value Date   HGBA1C 9.1 (H) 08/19/2014   No results found for: PROLACTIN No results found for: CHOL, TRIG, HDL, CHOLHDL, VLDL, LDLCALC Lab  Results  Component Value Date   TSH 2.54 08/19/2014    Therapeutic Level Labs: No results found for: LITHIUM No results found for: CBMZ No results found for: VALPROATE  Current Medications: Current Outpatient Medications  Medication Sig Dispense Refill  . aspirin EC 325 MG tablet Take 325 mg by mouth daily.    [name] EPIPEN 2-PAK 0.3 MG/0.3ML SOAJ injection Inject 0.3 mg into the skin daily as needed (allergic reaction).     [name]  Evolocumab (REPATHA) 140 MG/ML SOSY Inject 140 mg into the skin every 14 (fourteen) days.    . insulin lispro (HUMALOG) 100 UNIT/ML injection Inject 60 units daily via insulin pump. 20 mL 11  . levothyroxine (SYNTHROID, LEVOTHROID) 25 MCG tablet Take 25 mcg by mouth daily before breakfast.    . pantoprazole (PROTONIX) 40 MG tablet Take 80 mg by mouth at bedtime.     . temazepam (RESTORIL) 7.5 MG capsule Take 7.5 - 15 mg at night as needed for sleep 60 capsule 0   No current facility-administered medications for this visit.     Musculoskeletal: Strength & Muscle Tone: within normal limits Gait & Station: normal Patient leans: N/A  Psychiatric Specialty Exam: Review of Systems  Psychiatric/Behavioral: Negative for depression, hallucinations, memory loss, substance abuse and suicidal ideas. The patient is nervous/anxious and has insomnia.   All other systems reviewed and are negative.   Blood pressure 124/80, pulse 83, height 5\' 6"  (1.676 m), weight 132 lb (59.9 kg), SpO2 98 %.Body mass index is 21.31 kg/m.  General Appearance: Fairly Groomed  Eye Contact:  Good  Speech:  Clear and Coherent  Volume:  Normal  Mood:  tired  Affect:  Appropriate, Congruent and slightly restricted  Thought Process:  Coherent  Orientation:  Full (Time, Place, and Person)  Thought Content:  Logical  Suicidal Thoughts:  No  Homicidal Thoughts:  No  Memory:  Immediate;   Good  Judgement:  Good  Insight:  Good  Psychomotor Activity:  Normal  Concentration:  Concentration: Good  and Attention Span: Good  Recall:  Good  Fund of Knowledge:Good  Language: Good  Akathisia:  No  Handed:  Right  AIMS (if indicated):  not done  Assets:  Communication Skills Desire for Improvement  ADL's:  Intact  Cognition: WNL  Sleep:  Poor   Screenings:   Assessment and Plan:  Jessica [name] is a 43 y.o. year old female with a history of bipolar disorder, hashimoto's thyroiditis, AIH/Hereditary pancreatitis s/p islet cell transplant, diabetes, s/p Roux-en- Y bypass surgery, left lower extremity bypass with a subsequent balloon angioplasty, who is referred for bipolar I disorder.   #  MDD, single episode, mild Patient reports self-limited symptoms of depression.  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  Although she was diagnosed with bipolar disorder by other provider, she only reports racing thoughts with irritability, insomnia, and her clinical picture is not consistent with mania.  Will closely monitor.  She will greatly benefit from supportive therapy/CBT; will make referral.   # Insomnia Discussed sleep hygiene.  She is specifically advised to avoid blue light late in the day. She has had tried multiple sleep aid as documented above. Will try temazepam for insomnia.  Discussed potential side effect of drowsiness.  She agrees that this medication is not continued if any signs of misuse of medication/drug.   # history of ADHD Patient reports history of ADHD as a child.  She does have features of ADHD, although she denies significant issues when she used to be employed.  Will continue to monitor.   Plan 1. Start temazepam 7.5- 15 mg at night  2. Discontinue lorazepam 3. Referral to therapy  4. Return to clinic in one month for 30 mins  The patient demonstrates the following risk factors for suicide: Chronic risk factors for suicide include: psychiatric disorder of  depression and medical illness pancreatitis. Acute risk  factors for suicide include: unemployment. Protective factors for this patient include: positive social support, coping skills and hope for the future. Considering these factors, the overall suicide risk at this point appears to be low. Patient is appropriate for outpatient follow up.     Neysa Hotter, MD 1/28/20203:05 PM

## 2018-07-18 ENCOUNTER — Ambulatory Visit (INDEPENDENT_AMBULATORY_CARE_PROVIDER_SITE_OTHER): Payer: 59 | Admitting: Psychiatry

## 2018-07-18 VITALS — BP 124/80 | HR 83 | Ht 66.0 in | Wt 132.0 lb

## 2018-07-18 DIAGNOSIS — G4700 Insomnia, unspecified: Secondary | ICD-10-CM

## 2018-07-18 DIAGNOSIS — F320 Major depressive disorder, single episode, mild: Secondary | ICD-10-CM

## 2018-07-18 MED ORDER — TEMAZEPAM 7.5 MG PO CAPS: ORAL_CAPSULE | ORAL | 0 refills | Status: DC

## 2018-07-18 NOTE — Patient Instructions (Addendum)
1. Start temazepam 7.5- 15 mg at night  2. Discontinue lorazepam 3. Referral to therapy  4. Return to clinic in one month for 30 mins

## 2018-07-26 ENCOUNTER — Telehealth (HOSPITAL_COMMUNITY): Payer: Self-pay | Admitting: *Deleted

## 2018-07-26 NOTE — Telephone Encounter (Signed)
She may discontinue the medication if Temazepam does not work for her. Unfortunately I do not have other medication to suggest for sleep at this time given she has tried other sleeping aids with limited benefit. Advise her to continue to cut blue light, and do regular exercise if able.

## 2018-07-26 NOTE — Telephone Encounter (Signed)
Dr Vanetta Shawl Patient LVM stating that she is only getting 1-2 hrs asleep @ night since starting the Temazepam  & asked if there is something else she an try? Next visit 08/22/2018

## 2018-08-16 NOTE — Progress Notes (Signed)
BH MD/PA/NP OP Progress Note  08/22/2018 3:06 PM Jessica [name]  MRN:  [id number]  Chief Complaint:  Chief Complaint    Follow-up; Depression     HPI:  Patient presents for follow-up appointment for depression.  She states that she could not sleep with temazepam, although she tries up to 22.5 mg.  She has been back to Ativan, although she does limit its use when it is not necessary.  She states that she has constant racing thoughts; although she wants to do something, she does not have good energy to do those things.  She enjoys playing billiard.  She had impulsive shopping of buying a billiard stick and had to ask her parents for money. She also invited a 73 year old boy to her house as she wanted to help him. She regret this act as she should have asked her husband before offering; she also did not think he would move in.  She denies decreased need for sleep or euphoria.  She only sleeps 3 hours or less.  She occasionally feels depressed.  She tends to feel anxious.  She denies panic attacks.  She has mild irritability.  She has difficulty in concentration.   Ativan filled on 07/23/2018  Visit Diagnosis:    ICD-10-CM   1. Current mild episode of major depressive disorder without prior episode (HCC) F32.0   2. GAD (generalized anxiety disorder) F41.1     Past Psychiatric History: Please see initial evaluation for full details. I have reviewed the history. No updates at this time.   Past Medical History:  Past Medical History:  Diagnosis Date  . ADHD (attention deficit hyperactivity disorder)   . Anxiety   . Diabetes mellitus type I (HCC)   . Diverticulosis of colon without hemorrhage   . DM (diabetes mellitus) (HCC)    post pancreatectomy  . GERD (gastroesophageal reflux disease)   . Hashimoto's thyroiditis    euthyroid  . Hypercholesterolemia   . PONV (postoperative nausea and vomiting)     Past Surgical History:  Procedure Laterality Date  . ABDOMINAL AORTOGRAM W/LOWER  EXTREMITY N/A 04/27/2017   Procedure: ABDOMINAL AORTOGRAM W/LOWER EXTREMITY;  Surgeon: Maeola Harman, MD;  Location: [facility] INVASIVE CV LAB;  Service: Cardiovascular;  Laterality: N/A;  . ABDOMINAL HYSTERECTOMY    . BIOPSY N/A 05/15/2014   Procedure: GASTIC,  ASCENDING, AND DESCEDNING/SIGMOID  COLON BIOPSY;  Surgeon: Corbin Ade, MD;  Location: [facility] ORS;  Service: Endoscopy;  Laterality: N/A;  . CHOLECYSTECTOMY    . COLONOSCOPY WITH PROPOFOL N/A 05/15/2014   Dr. Jena Gauss (primary GI is Dr. Darrick Penna). Hemorrhoids, negative random colon biopsies. GI pathogen panel negative. Diverticulosis.  [name] complete hysterectomy    . ESOPHAGOGASTRODUODENOSCOPY (EGD) WITH PROPOFOL N/A 05/15/2014   Dr. Jena Gauss (primary GI Dr. Darrick Penna), Billroth II, inflamed residual gastric mucosa, benign biopsies  . FEMORAL-POPLITEAL BYPASS GRAFT    . 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  [name] KNEE SURGERY Right    X 6-5 arthroscopies and open procedure 1 time- tighten patellar tendon and loose IT band  . 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    Family Psychiatric History: Please see initial evaluation for full details. I have reviewed the history. No updates at this time.     Family History:  Family History  Problem Relation Age of Onset  . Pancreatic cancer Paternal Grandfather   . Cirrhosis Paternal Grandfather   . Pancreatitis Other  cousin  . Throat cancer Mother   . Depression Father   . Colon cancer Neg Hx     Social History:  Social History   Socioeconomic History  . Marital status: Married    Spouse name: Not on file  . Number of children: 0  . Years of education: Not on file  . Highest education level: Not on file  Occupational History  . Occupation: Associate Professor  Social Needs  . Financial resource strain: Not on file  . Food insecurity:    Worry: Not on file    Inability: Not on file  . Transportation needs:    Medical: Not on file     Non-medical: Not on file  Tobacco Use  . Smoking status: Current Every Day Smoker    Packs/day: 1.50    Years: 20.00    Pack years: 30.00    Types: Cigarettes  . Smokeless tobacco: Never Used  . Tobacco comment: Has cut back to 1 pack a day, not ready to quit  Substance and Sexual Activity  . Alcohol use: No    Alcohol/week: 0.0 standard drinks  . Drug use: No  . Sexual activity: Yes    Birth control/protection: None  Lifestyle  . Physical activity:    Days per week: Not on file    Minutes per session: Not on file  . Stress: Not on file  Relationships  . Social connections:    Talks on phone: Not on file    Gets together: Not on file    Attends religious service: Not on file    Active member of club or organization: Not on file    Attends meetings of clubs or organizations: Not on file    Relationship status: Not on file  Other Topics Concern  . Not on file  Social History Narrative  . Not on file    Allergies:  Allergies  Allergen Reactions  . Erythromycin Anaphylaxis, Hives and Nausea Only  . Levofloxacin Other (See Comments)    Burning during administration IV prior to surgery.Pt was told not to take it again.  . Niaspan [Niacin] Anaphylaxis  . Penicillins Anaphylaxis    Has patient had a PCN reaction causing immediate rash, facial/tongue/throat swelling, SOB or lightheadedness with hypotension: Yes Has patient had a PCN reaction causing severe rash involving mucus membranes or skin necrosis: No Has patient had a PCN reaction that required hospitalization: No Has patient had a PCN reaction occurring within the last 10 years: No If all of the above answers are "NO", then may proceed with Cephalosporin use.   . Shellfish Allergy Anaphylaxis  . Sulfa Antibiotics Anaphylaxis    "Blisters in my throat"  . Wellbutrin [Bupropion] Other (See Comments)    Suicidal thoughts while on Lexapro  . Morphine Other (See Comments)    ineffective  . Strawberry Extract Hives   . Sulfamethoxazole Other (See Comments)    Blister in throat, sores in mouth  . Tape Other (See Comments)    Transpore Tape causes skin to peel;  and Nicoderm Adhesive Patch cause blisters  . Doxycycline Hives and Nausea Only    A 100mg  tablet formulation caused hives, nausea, vomiting.  [name] Keflex [Cephalexin] Other (See Comments)    Hallucinations  . Statins Other (See Comments)    Muscle pain  . Valium [Diazepam] Other (See Comments)    Confusion and amnesia   . Vancomycin Hives and Nausea Only  . Zetia [Ezetimibe] Diarrhea  . 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 Morphine And Related Other (See Comments)    ineffective    Metabolic Disorder Labs: Lab Results  Component Value Date   HGBA1C 9.1 (H) 08/19/2014   No results found for: PROLACTIN No results found for: CHOL, TRIG, HDL, CHOLHDL, VLDL, LDLCALC Lab Results  Component Value Date   TSH 2.54 08/19/2014   TSH 0.38 01/29/2014    Therapeutic Level Labs: No results found for: LITHIUM No results found for: VALPROATE No components found for:  CBMZ  Current Medications: Current Outpatient Medications  Medication Sig Dispense Refill  . aspirin EC 325 MG tablet Take 325 mg by mouth daily.    [name] EPIPEN 2-PAK 0.3 MG/0.3ML SOAJ injection Inject 0.3 mg into the skin daily as needed (allergic reaction).     . Evolocumab (REPATHA) 140 MG/ML SOSY Inject 140 mg into the skin every 14 (fourteen) days.    . insulin lispro (HUMALOG) 100 UNIT/ML injection Inject 60 units daily via insulin pump. 20 mL 11  . levothyroxine (SYNTHROID, LEVOTHROID) 25 MCG tablet Take 25 mcg by mouth daily before breakfast.    . lipase/protease/amylase (CREON) 36000 UNITS CPEP capsule Take by mouth.    [name] LORazepam (ATIVAN) 1 MG tablet Take 1 mg by mouth every 8 (eight) hours.    . pantoprazole (PROTONIX) 40 MG tablet Take 80 mg by mouth at bedtime.     [name] doxepin  (SINEQUAN) 25 MG capsule 25 mg at night for one week, then 50 mg at night 60 capsule 0   No current facility-administered medications for this visit.      Musculoskeletal: Strength & Muscle Tone: within normal limits Gait & Station: normal Patient leans: N/A  Psychiatric Specialty Exam: Review of Systems  Psychiatric/Behavioral: Positive for depression. Negative for hallucinations, memory loss, substance abuse and suicidal ideas. The patient is nervous/anxious and has insomnia.   All other systems reviewed and are negative.   Blood pressure 119/74, pulse 92, height  (1.676 m), weight 131 lb (59.4 kg), SpO2 99 %.Body mass index is 21.14 kg/m.  General Appearance: Fairly Groomed  Eye Contact:  Good  Speech:  Clear and Coherent  Volume:  Normal  Mood:  Anxious  Affect:  Appropriate, Congruent and calm  Thought Process:  Coherent  Orientation:  Full (Time, Place, and Person)  Thought Content: Logical   Suicidal Thoughts:  No  Homicidal Thoughts:  No  Memory:  Immediate;   Good  Judgement:  Good  Insight:  Fair  Psychomotor Activity:  Normal  Concentration:  Concentration: Good and Attention Span: Good  Recall:  Good  Fund of Knowledge: Good  Language: Good  Akathisia:  No  Handed:  Right  AIMS (if indicated): not done  Assets:  Communication Skills Desire for Improvement  ADL's:  Intact  Cognition: WNL  Sleep:  Poor   Screenings:   Assessment and Plan:  Jessica [name] is a 43 y.o. year old female with a history of depression,  hashimoto's thyroiditis, AIH/Hereditary pancreatitis s/p islet cell transplant, diabetes, s/p Roux-en- Y bypass surgery, left lower extremity bypass with a subsequent balloon angioplasty , who presents for follow up appointment for Current mild episode of major depressive disorder without prior episode (HCC)  GAD (generalized anxiety disorder)  # MDD, single episode, mild # Insomnia Patient continues to report self-limited symptoms of  depression and anxiety.  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  Will start doxepin to target depression,  Anxiety and insomnia.  Discussed potential side effect of palpitation and increased appetite, dry mouth.  Noted that although she was diagnosed with bipolar disorder by other provider, she only reports racing thoughts with irritability, insomnia and her clinical picture is not consistent with mania.  Will closely monitor.   # history of ADHD Patient reports history of ADHD as a child.  She does have features of ADHD, although she denies significant issues when she was employed.  We will continue to monitor.   Plan 1. Start doxepin 25 mg at night for one week, then 50 mg at night  2. Return to clinic in one month for 30 mins - lorazepam 1 mg bid prn for anxiety (will do refill if she runs out)   Past trials of medication: sertraline, fluoxetine, Paxil, citalopram, lexapro, bupropion (voices), venlafaxine, duloxetine, trintellix, mirtazapine, trazodone, amitriptyline, lamotrigine, Geodon, olanzapine, Vraylar, xanax, clonazepam, temazepam, ritalin, trazodone, Ambien, Rozerem, Lunesta, Belsomra, benadryl,   The patient demonstrates the following risk factors for suicide: Chronic risk factors for suicide include: psychiatric disorder of depression and medical illness pancreatitis. Acute risk factors for suicide include: unemployment. Protective factors for this patient include: positive social support, coping skills and hope for the future. Considering these factors, the overall suicide risk at this point appears to be low. Patient is appropriate for outpatient follow up.  The duration of this appointment visit was 25 minutes of face-to-face time with the patient.  Greater than 50% of this time was spent in counseling, explanation of  diagnosis, planning of further management, and coordination  of care.  Neysa Hotter, MD 08/22/2018, 3:06 PM

## 2018-08-21 ENCOUNTER — Encounter (HOSPITAL_COMMUNITY): Payer: Self-pay | Admitting: Psychiatry

## 2018-08-21 ENCOUNTER — Ambulatory Visit (INDEPENDENT_AMBULATORY_CARE_PROVIDER_SITE_OTHER): Payer: 59 | Admitting: Psychiatry

## 2018-08-21 DIAGNOSIS — F320 Major depressive disorder, single episode, mild: Secondary | ICD-10-CM | POA: Diagnosis not present
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
## 2018-08-22 ENCOUNTER — Ambulatory Visit (INDEPENDENT_AMBULATORY_CARE_PROVIDER_SITE_OTHER): Payer: 59 | Admitting: Psychiatry

## 2018-08-22 VITALS — BP 119/74 | HR 92 | Ht 66.0 in | Wt 131.0 lb

## 2018-08-22 DIAGNOSIS — F320 Major depressive disorder, single episode, mild: Secondary | ICD-10-CM

## 2018-08-22 DIAGNOSIS — F411 Generalized anxiety disorder: Secondary | ICD-10-CM

## 2018-08-22 MED ORDER — DOXEPIN HCL 25 MG PO CAPS: ORAL_CAPSULE | ORAL | 0 refills | Status: DC

## 2018-08-22 NOTE — Patient Instructions (Signed)
1. Start doxepin 25 mg at night for one week, then 50 mg at night  2. Return to clinic in one month for 30 mins

## 2018-08-31 ENCOUNTER — Telehealth (HOSPITAL_COMMUNITY): Payer: Self-pay

## 2018-08-31 NOTE — Telephone Encounter (Signed)
Noted. Advise her to discontinue this medication if it causes side effect. Will not try any other medication at this time.

## 2018-08-31 NOTE — Telephone Encounter (Signed)
Medication problem -Patient called stating she cannot take Doxepin.  States she has tried it for a week now and it has been causing "severe anxiety and panic attacks".  Wanted to let Dr. Vanetta Shawl know this is not working and causing problems.

## 2018-08-31 NOTE — Telephone Encounter (Signed)
Medication management - Telephone call with pt to inform to stop her Doxepin medication if anxiety and panic attacks worse with it.  Informed Dr. Vanetta Shawl did not want to start anything in its place as this time and pt will call back if any further problems.

## 2018-09-14 NOTE — Progress Notes (Signed)
Virtual Visit via Video Note  I connected with Jessica [name] on 09/22/18 at 10:00 AM EDT by a video enabled telemedicine application and verified that I am speaking with the correct person using two identifiers.   I discussed the limitations of evaluation and management by telemedicine and the availability of in person appointments. The patient expressed understanding and agreed to proceed.   I discussed the assessment and treatment plan with the patient. The patient was provided an opportunity to ask questions and all were answered. The patient agreed with the plan and demonstrated an understanding of the instructions.   The patient was advised to call back or seek an in-person evaluation if the symptoms worsen or if the condition fails to improve as anticipated.  I provided 30 minutes of non-face-to-face time during this encounter.   Jessica Hotter, MD    [facility] MD/PA/NP OP Progress Note  09/22/2018 10:40 AM [name]  MRN:  [id number]  Chief Complaint:  Chief Complaint    Follow-up; Depression; Anxiety     HPI:  This is a follow-up visit for insomnia and depression.  She states that she feels frustrated as she is unable to do things as she wishes due to coronavirus, although she finally feels better.  She takes a walk with her dogs regularly.  She reports good relationship with her husband.  She could not continue doxepin as it caused panic attack.  She tried it for a few times with the same result.  She still struggles with sleep. She follows instruction for sleep hygiene, and denies any taking naps during the day. She has "thousands of thoughts," although her body reacts in "slow."  She has a history of ADHD as a child.  Although she does not recall if Ritalin worked for her, her father, who was immediately did not want the patient to continue the medication.  Although she graduated from college, she had difficulty especially with online courses.  She tends to be forgetful, has  difficulty in doing multiple tasks.  She is unable to sustain attention or complete things.  She denies feeling depressed.  She has occasional anxiety.  She denies irritability.  She denies decreased need for sleep, euphoria or increased goal-directed activity.  She has been taking Ativan 1 mg/day when she has significant insomnia. She denies alcohol use or drug use.  Lorazepam 1 mg BID, filled on 08/31/2018   Visit Diagnosis:    ICD-10-CM   1. MDD (major depressive disorder), single episode, in full remission (HCC) F32.5   2. Attention deficit hyperactivity disorder (ADHD), unspecified ADHD type F90.9   3. Insomnia, unspecified type G47.00     Past Psychiatric History: Please see initial evaluation for full details. I have reviewed the history. No updates at this time.     Past Medical History:  Past Medical History:  Diagnosis Date  . ADHD (attention deficit hyperactivity disorder)   . Anxiety   . Diabetes mellitus type I (HCC)   . Diverticulosis of colon without hemorrhage   . DM (diabetes mellitus) (HCC)    post pancreatectomy  . GERD (gastroesophageal reflux disease)   . Hashimoto's thyroiditis    euthyroid  . Hypercholesterolemia   . PONV (postoperative nausea and vomiting)     Past Surgical History:  Procedure Laterality Date  . ABDOMINAL AORTOGRAM W/LOWER EXTREMITY N/A 04/27/2017   Procedure: ABDOMINAL AORTOGRAM W/LOWER EXTREMITY;  Surgeon: Maeola Harman, MD;  Location: [facility] INVASIVE CV LAB;  Service: Cardiovascular;  Laterality: N/A;  .  ABDOMINAL HYSTERECTOMY    . BIOPSY N/A 05/15/2014   Procedure: GASTIC,  ASCENDING, AND DESCEDNING/SIGMOID  COLON BIOPSY;  Surgeon: Corbin Ade, MD;  Location: [facility] ORS;  Service: Endoscopy;  Laterality: N/A;  . CHOLECYSTECTOMY    . COLONOSCOPY WITH PROPOFOL N/A 05/15/2014   Dr. Jena Gauss (primary GI is Dr. Darrick Penna). Hemorrhoids, negative random colon biopsies. GI pathogen panel negative. Diverticulosis.  [name] complete hysterectomy     . ESOPHAGOGASTRODUODENOSCOPY (EGD) WITH PROPOFOL N/A 05/15/2014   Dr. Jena Gauss (primary GI Dr. Darrick Penna), Billroth II, inflamed residual gastric mucosa, benign biopsies  . FEMORAL-POPLITEAL BYPASS GRAFT    . 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  [name] KNEE SURGERY Right    X 6-5 arthroscopies and open procedure 1 time- tighten patellar tendon and loose IT band  . 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    Family Psychiatric History: Please see initial evaluation for full details. I have reviewed the history. No updates at this time.     Family History:  Family History  Problem Relation Age of Onset  . Pancreatic cancer Paternal Grandfather   . Cirrhosis Paternal Grandfather   . Pancreatitis Other        cousin  . Throat cancer Mother   . Depression Father   . Colon cancer Neg Hx     Social History:  Social History   Socioeconomic History  . Marital status: Married    Spouse name: Not on file  . Number of children: 0  . Years of education: Not on file  . Highest education level: Not on file  Occupational History  . Occupation: Associate Professor  Social Needs  . Financial resource strain: Not on file  . Food insecurity:    Worry: Not on file    Inability: Not on file  . Transportation needs:    Medical: Not on file    Non-medical: Not on file  Tobacco Use  . Smoking status: Current Every Day Smoker    Packs/day: 1.50    Years: 20.00    Pack years: 30.00    Types: Cigarettes  . Smokeless tobacco: Never Used  . Tobacco comment: Has cut back to 1 pack a day, not ready to quit  Substance and Sexual Activity  . Alcohol use: No    Alcohol/week: 0.0 standard drinks  . Drug use: No  . Sexual activity: Yes    Birth control/protection: None  Lifestyle  . Physical activity:    Days per week: Not on file    Minutes per session: Not on file  . Stress: Not on file  Relationships  . Social connections:    Talks on phone: Not on  file    Gets together: Not on file    Attends religious service: Not on file    Active member of club or organization: Not on file    Attends meetings of clubs or organizations: Not on file    Relationship status: Not on file  Other Topics Concern  . Not on file  Social History Narrative  . Not on file    Allergies:  Allergies  Allergen Reactions  . Erythromycin Anaphylaxis, Hives and Nausea Only  . Levofloxacin Other (See Comments)    Burning during administration IV prior to surgery.Pt was told not to take it again.  . Niaspan [Niacin] Anaphylaxis  . Penicillins Anaphylaxis    Has patient had a PCN reaction causing immediate rash, facial/tongue/throat swelling, SOB or lightheadedness with hypotension:  Yes Has patient had a PCN reaction causing severe rash involving mucus membranes or skin necrosis: No Has patient had a PCN reaction that required hospitalization: No Has patient had a PCN reaction occurring within the last 10 years: No If all of the above answers are "NO", then may proceed with Cephalosporin use.   . Shellfish Allergy Anaphylaxis  . Sulfa Antibiotics Anaphylaxis    "Blisters in my throat"  . Wellbutrin [Bupropion] Other (See Comments)    Suicidal thoughts while on Lexapro  . Morphine Other (See Comments)    ineffective  . Strawberry Extract Hives  . Sulfamethoxazole Other (See Comments)    Blister in throat, sores in mouth  . Tape Other (See Comments)    Transpore Tape causes skin to peel;  and Nicoderm Adhesive Patch cause blisters  . Doxycycline Hives and Nausea Only    A 100mg  tablet formulation caused hives, nausea, vomiting.  [name] Keflex [Cephalexin] Other (See Comments)    Hallucinations  . Statins Other (See Comments)    Muscle pain  . Valium [Diazepam] Other (See Comments)    Confusion and amnesia   . Vancomycin Hives and Nausea Only  . Zetia [Ezetimibe] Diarrhea  . 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 Morphine And Related Other (See Comments)    ineffective    Metabolic Disorder Labs: Lab Results  Component Value Date   HGBA1C 9.1 (H) 08/19/2014   No results found for: PROLACTIN No results found for: CHOL, TRIG, HDL, CHOLHDL, VLDL, LDLCALC Lab Results  Component Value Date   TSH 2.54 08/19/2014   TSH 0.38 01/29/2014    Therapeutic Level Labs: No results found for: LITHIUM No results found for: VALPROATE No components found for:  CBMZ  Current Medications: Current Outpatient Medications  Medication Sig Dispense Refill  . aspirin EC 325 MG tablet Take 325 mg by mouth daily.    [name] EPIPEN 2-PAK 0.3 MG/0.3ML SOAJ injection Inject 0.3 mg into the skin daily as needed (allergic reaction).     . Evolocumab (REPATHA) 140 MG/ML SOSY Inject 140 mg into the skin every 14 (fourteen) days.    . insulin lispro (HUMALOG) 100 UNIT/ML injection Inject 60 units daily via insulin pump. 20 mL 11  . levothyroxine (SYNTHROID, LEVOTHROID) 25 MCG tablet Take 25 mcg by mouth daily before breakfast.    . lipase/protease/amylase (CREON) 36000 UNITS CPEP capsule Take by mouth.    Melene Muller ON 09/29/2018] LORazepam (ATIVAN) 1 MG tablet Take 1 tablet (1 mg total) by mouth daily as needed for anxiety. 30 tablet 0  . methylphenidate (RITALIN) 10 MG tablet Take 1 tablet (10 mg total) by mouth 2 (two) times daily with breakfast and lunch. 60 tablet 0  . pantoprazole (PROTONIX) 40 MG tablet Take 80 mg by mouth at bedtime.      No current facility-administered medications for this visit.      Musculoskeletal: Strength & Muscle Tone: N/A Gait & Station: N/A Patient leans: N/A  Psychiatric Specialty Exam: Review of Systems  Psychiatric/Behavioral: Negative for depression, hallucinations, memory loss, substance abuse and suicidal ideas. The patient is nervous/anxious and has insomnia.   All other systems reviewed and are negative.   There were no vitals taken  for this visit.There is no height or weight on file to calculate BMI.  General Appearance: Fairly Groomed  Eye Contact:  Good  Speech:  Clear and Coherent  Volume:  Normal  Mood:  fine  Affect:  Appropriate, Congruent and Full Range  Thought Process:  Coherent  Orientation:  Full (Time, Place, and Person)  Thought Content: Logical   Suicidal Thoughts:  No  Homicidal Thoughts:  No  Memory:  Immediate;   Good  Judgement:  Good  Insight:  Good  Psychomotor Activity:  Normal  Concentration:  Concentration: Good and Attention Span: Good  Recall:  Good  Fund of Knowledge: Good  Language: Good  Akathisia:  No  Handed:  Right  AIMS (if indicated): not done  Assets:  Communication Skills Desire for Improvement  ADL's:  Intact  Cognition: WNL  Sleep:  Poor   Screenings:   Assessment and Plan:  Doree FudgeRebecca L Kniskern is a 43 y.o. year old female with a history of depression, hashimoto's thyroiditis,AIH/Hereditary pancreatitiss/p islet cell transplant,diabetes,s/p Roux-en- Y bypass surgery,left lower extremity bypass with a subsequent balloon angioplasty  , who presents for follow up appointment for MDD (major depressive disorder), single episode, in full remission (HCC)  Attention deficit hyperactivity disorder (ADHD), unspecified ADHD type  Insomnia, unspecified type  # ADHD Patient reports history of ADHD as a child. She continues to have symptoms of ADHD as described above.  Will try Ritalin from lower dose to target ADHD.  Discussed potential risk of worsening in anxiety, headache and palpitation.   # Insomnia She has tried multiple of psychotropics for insomnia with limited benefit.  Discussed sleep hygiene again.  Will see if there is any improvement in insomnia after treatment for ADHD.  Will discontinue doxepin at this time given patient reports side effect of worsening in anxiety.   # MDD, single episode in remission Patient denies any significant mood symptoms except  self-limited anxiety.  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  Given her symptoms are self-limited, do not want to initiate any antidepressant.  Noted that although she was diagnosed with bipolar disorder by other provider, she only reports racing thoughts with irritability, which may be more attributable to ADHD.  Will closely monitor.   Plan 1. Discontinue doxepin 2. Continue Lorazepam 1 mg daily prn for anxiety  3. Start Ritalin 10 mg twice a day  4. Return to clinic in one month for 30 mins  Past trials of medication:sertraline, fluoxetine,Paxil, citalopram, lexapro, bupropion (voices), venlafaxine, duloxetine, trintellix, mirtazapine, trazodone, amitriptyline, doxepin (insomnia), lamotrigine,Geodon, olanzapine,quetiapine, Vraylar, xanax, clonazepam, temazepam, ritalin, trazodone, Ambien, Rozerem, Lunesta, Belsomra, benadryl,   The patient demonstrates the following risk factors for suicide: Chronic risk factors for suicide include:psychiatric disorder ofdepressionand medical illness pancreatitis. Acute risk factorsfor suicide include: unemployment. Protective factorsfor this patient include: positive social support, coping skills and hope for the future. Considering these factors, the overall suicide risk at this point appears to below. Patientisappropriate for outpatient follow up.    Jessica Hottereina Ival Pacer, MD 09/22/2018, 10:40 AM

## 2018-09-22 ENCOUNTER — Other Ambulatory Visit: Payer: Self-pay

## 2018-09-22 ENCOUNTER — Ambulatory Visit (INDEPENDENT_AMBULATORY_CARE_PROVIDER_SITE_OTHER): Payer: 59 | Admitting: Psychiatry

## 2018-09-22 ENCOUNTER — Encounter (HOSPITAL_COMMUNITY): Payer: Self-pay | Admitting: Psychiatry

## 2018-09-22 DIAGNOSIS — Z79899 Other long term (current) drug therapy: Secondary | ICD-10-CM

## 2018-09-22 DIAGNOSIS — G4700 Insomnia, unspecified: Secondary | ICD-10-CM | POA: Diagnosis not present

## 2018-09-22 DIAGNOSIS — F325 Major depressive disorder, single episode, in full remission: Secondary | ICD-10-CM

## 2018-09-22 DIAGNOSIS — F909 Attention-deficit hyperactivity disorder, unspecified type: Secondary | ICD-10-CM | POA: Diagnosis not present

## 2018-09-22 MED ORDER — METHYLPHENIDATE HCL 10 MG PO TABS: 10.0000 mg | ORAL_TABLET | Freq: Two times a day (BID) | ORAL | 0 refills | Status: DC

## 2018-09-22 MED ORDER — LORAZEPAM 1 MG PO TABS: 1.0000 mg | ORAL_TABLET | Freq: Every day | ORAL | 0 refills | Status: DC | PRN

## 2018-09-28 ENCOUNTER — Other Ambulatory Visit: Payer: Self-pay

## 2018-09-28 ENCOUNTER — Ambulatory Visit (INDEPENDENT_AMBULATORY_CARE_PROVIDER_SITE_OTHER): Payer: 59 | Admitting: Psychiatry

## 2018-09-28 DIAGNOSIS — F325 Major depressive disorder, single episode, in full remission: Secondary | ICD-10-CM | POA: Diagnosis not present

## 2018-09-28 DIAGNOSIS — F909 Attention-deficit hyperactivity disorder, unspecified type: Secondary | ICD-10-CM

## 2018-09-28 NOTE — Progress Notes (Signed)
   Virtual Visit via Video Note  I connected with Jessica [name] on 09/28/18 at 10:00 AM EDT by a video enabled telemedicine application and verified that I am speaking with the correct person using two identifiers.   I discussed the limitations of evaluation and management by telemedicine and the availability of in person appointments. The patient expressed understanding and agreed to proceed.    I discussed the assessment and treatment plan with the patient. The patient was provided an opportunity to ask questions and all were answered. The patient agreed with the plan and demonstrated an understanding of the instructions.     I provided 50 minutes of non-face-to-face time during this encounter.   Gustava Berland E Govind Furey, LCSW
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
## 2018-10-11 ENCOUNTER — Ambulatory Visit (HOSPITAL_COMMUNITY): Payer: Medicaid - Out of State | Admitting: Psychiatry
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
## 2018-10-13 ENCOUNTER — Other Ambulatory Visit: Payer: Self-pay

## 2018-10-13 ENCOUNTER — Ambulatory Visit (INDEPENDENT_AMBULATORY_CARE_PROVIDER_SITE_OTHER): Payer: 59 | Admitting: Psychiatry

## 2018-10-13 DIAGNOSIS — F325 Major depressive disorder, single episode, in full remission: Secondary | ICD-10-CM
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
## 2018-10-19 ENCOUNTER — Other Ambulatory Visit: Payer: Self-pay

## 2018-10-19 ENCOUNTER — Ambulatory Visit (INDEPENDENT_AMBULATORY_CARE_PROVIDER_SITE_OTHER): Payer: 59 | Admitting: Psychiatry

## 2018-10-19 ENCOUNTER — Encounter (HOSPITAL_COMMUNITY): Payer: Self-pay | Admitting: Psychiatry

## 2018-10-19 DIAGNOSIS — F909 Attention-deficit hyperactivity disorder, unspecified type: Secondary | ICD-10-CM | POA: Diagnosis not present

## 2018-10-19 DIAGNOSIS — F325 Major depressive disorder, single episode, in full remission: Secondary | ICD-10-CM

## 2018-10-19 MED ORDER — METHYLPHENIDATE HCL 20 MG PO TABS: 20.0000 mg | ORAL_TABLET | Freq: Every day | ORAL | 0 refills | Status: DC

## 2018-10-19 MED ORDER — METHYLPHENIDATE HCL 5 MG PO TABS: 5.0000 mg | ORAL_TABLET | Freq: Every day | ORAL | 0 refills | Status: DC

## 2018-10-19 MED ORDER — LORAZEPAM 1 MG PO TABS: 1.0000 mg | ORAL_TABLET | Freq: Every day | ORAL | 0 refills | Status: DC | PRN

## 2018-10-19 NOTE — Patient Instructions (Signed)
1. Continue Lorazepam 1 mg daily prn for anxiety  2. Increase Ritalin 20 mg daily, 5 mg at noon  3. Next appointment 5/28 at 10:50

## 2018-10-26 ENCOUNTER — Other Ambulatory Visit: Payer: Self-pay

## 2018-10-26 ENCOUNTER — Ambulatory Visit (INDEPENDENT_AMBULATORY_CARE_PROVIDER_SITE_OTHER): Payer: 59 | Admitting: Psychiatry

## 2018-10-26 DIAGNOSIS — F325 Major depressive disorder, single episode, in full remission: Secondary | ICD-10-CM | POA: Diagnosis not present

## 2018-10-26 DIAGNOSIS — F909 Attention-deficit hyperactivity disorder, unspecified type: Secondary | ICD-10-CM | POA: Diagnosis not present
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
## 2018-11-09 ENCOUNTER — Ambulatory Visit (INDEPENDENT_AMBULATORY_CARE_PROVIDER_SITE_OTHER): Payer: 59 | Admitting: Psychiatry

## 2018-11-09 ENCOUNTER — Other Ambulatory Visit: Payer: Self-pay

## 2018-11-09 DIAGNOSIS — F325 Major depressive disorder, single episode, in full remission: Secondary | ICD-10-CM | POA: Diagnosis not present
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
## 2018-11-20 ENCOUNTER — Ambulatory Visit (INDEPENDENT_AMBULATORY_CARE_PROVIDER_SITE_OTHER): Payer: 59 | Admitting: Psychiatry

## 2018-11-20 ENCOUNTER — Other Ambulatory Visit: Payer: Self-pay

## 2018-11-20 ENCOUNTER — Encounter (HOSPITAL_COMMUNITY): Payer: Self-pay | Admitting: Psychiatry

## 2018-11-20 DIAGNOSIS — F909 Attention-deficit hyperactivity disorder, unspecified type: Secondary | ICD-10-CM

## 2018-11-20 DIAGNOSIS — F325 Major depressive disorder, single episode, in full remission: Secondary | ICD-10-CM

## 2018-11-20 DIAGNOSIS — G4700 Insomnia, unspecified: Secondary | ICD-10-CM | POA: Diagnosis not present

## 2018-11-20 MED ORDER — LORAZEPAM 2 MG PO TABS: 2.0000 mg | ORAL_TABLET | Freq: Every day | ORAL | 0 refills | Status: DC | PRN

## 2018-11-20 MED ORDER — METHYLPHENIDATE HCL 20 MG PO TABS: 20.0000 mg | ORAL_TABLET | Freq: Every day | ORAL | 0 refills | Status: DC

## 2018-11-20 MED ORDER — METHYLPHENIDATE HCL 5 MG PO TABS: 5.0000 mg | ORAL_TABLET | Freq: Every day | ORAL | 0 refills | Status: DC

## 2018-11-20 NOTE — Patient Instructions (Signed)
1. Increase Lorazepam 2 mg daily prn for anxiety  2. Continue Ritalin 20 mg daily, 5 mg at noon  3. Next appointment 6/29 at 1:20

## 2018-11-23 ENCOUNTER — Other Ambulatory Visit: Payer: Self-pay

## 2018-11-23 ENCOUNTER — Ambulatory Visit (INDEPENDENT_AMBULATORY_CARE_PROVIDER_SITE_OTHER): Payer: 59 | Admitting: Psychiatry

## 2018-11-23 DIAGNOSIS — F325 Major depressive disorder, single episode, in full remission: Secondary | ICD-10-CM
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
## 2018-12-07 ENCOUNTER — Ambulatory Visit (INDEPENDENT_AMBULATORY_CARE_PROVIDER_SITE_OTHER): Payer: 59 | Admitting: Psychiatry

## 2018-12-07 ENCOUNTER — Encounter (HOSPITAL_COMMUNITY): Payer: Self-pay | Admitting: Psychiatry

## 2018-12-07 ENCOUNTER — Other Ambulatory Visit: Payer: Self-pay

## 2018-12-07 DIAGNOSIS — F325 Major depressive disorder, single episode, in full remission: Secondary | ICD-10-CM | POA: Diagnosis not present
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
## 2018-12-18 ENCOUNTER — Encounter (HOSPITAL_COMMUNITY): Payer: Self-pay | Admitting: Psychiatry

## 2018-12-18 ENCOUNTER — Other Ambulatory Visit: Payer: Self-pay

## 2018-12-18 ENCOUNTER — Ambulatory Visit (INDEPENDENT_AMBULATORY_CARE_PROVIDER_SITE_OTHER): Payer: 59 | Admitting: Psychiatry

## 2018-12-18 ENCOUNTER — Telehealth: Payer: Self-pay

## 2018-12-18 DIAGNOSIS — F909 Attention-deficit hyperactivity disorder, unspecified type: Secondary | ICD-10-CM

## 2018-12-18 DIAGNOSIS — G4700 Insomnia, unspecified: Secondary | ICD-10-CM

## 2018-12-18 MED ORDER — METHYLPHENIDATE HCL ER (OSM) 18 MG PO TBCR: 18.0000 mg | EXTENDED_RELEASE_TABLET | Freq: Every day | ORAL | 0 refills | Status: DC

## 2018-12-18 MED ORDER — LORAZEPAM 2 MG PO TABS: 2.0000 mg | ORAL_TABLET | Freq: Every day | ORAL | 0 refills | Status: DC | PRN

## 2018-12-18 NOTE — Telephone Encounter (Signed)
Pt called and said that she is having some pain when she walks at the bottom of the area that her graft was put in. She said that she has been trying to walk more and has noticed that it is really bothering her. Also notes some swelling in her leg as well.   Appt made for her to be seen just to have the area looked at.   York Cerise, CMA

## 2018-12-18 NOTE — Patient Instructions (Signed)
1.Continue Lorazepam 2 mg daily prn for insomnia 2. Discontinue Ritalin 3. Start concerta 18 mg daily  4. Referral to sleep clinic 5. Next appointment 7/27 at 3:40

## 2018-12-19 ENCOUNTER — Telehealth (HOSPITAL_COMMUNITY): Payer: Self-pay | Admitting: Rehabilitation

## 2018-12-19 NOTE — Telephone Encounter (Signed)

## 2018-12-20 ENCOUNTER — Ambulatory Visit (INDEPENDENT_AMBULATORY_CARE_PROVIDER_SITE_OTHER): Payer: Medicare (Managed Care) | Admitting: Family

## 2018-12-20 ENCOUNTER — Encounter: Payer: Self-pay | Admitting: Family

## 2018-12-20 ENCOUNTER — Other Ambulatory Visit: Payer: Self-pay

## 2018-12-20 ENCOUNTER — Ambulatory Visit (INDEPENDENT_AMBULATORY_CARE_PROVIDER_SITE_OTHER): Payer: Medicare (Managed Care) | Admitting: Psychiatry

## 2018-12-20 VITALS — BP 121/86 | HR 93 | Temp 99.4°F | Resp 14 | Ht 66.0 in | Wt 135.0 lb

## 2018-12-20 DIAGNOSIS — M79604 Pain in right leg: Secondary | ICD-10-CM | POA: Diagnosis not present

## 2018-12-20 DIAGNOSIS — F17200 Nicotine dependence, unspecified, uncomplicated: Secondary | ICD-10-CM

## 2018-12-20 DIAGNOSIS — E1065 Type 1 diabetes mellitus with hyperglycemia: Secondary | ICD-10-CM | POA: Diagnosis not present

## 2018-12-20 DIAGNOSIS — IMO0002 Reserved for concepts with insufficient information to code with codable children: Secondary | ICD-10-CM

## 2018-12-20 DIAGNOSIS — I779 Disorder of arteries and arterioles, unspecified: Secondary | ICD-10-CM

## 2018-12-20 DIAGNOSIS — F325 Major depressive disorder, single episode, in full remission: Secondary | ICD-10-CM

## 2018-12-20 DIAGNOSIS — M79605 Pain in left leg: Secondary | ICD-10-CM
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
## 2018-12-20 NOTE — Patient Instructions (Signed)

## 2018-12-20 NOTE — Progress Notes (Signed)
[facility] [place]   CC: worsening bilateral LE pain with walking, history of peripheral artery occlusive disease  History of Present Illness Jessica [name] is a 43 y.o. female with a history of left femoropopliteal bypass in 2018 at another healthcare system.  She subsequently had a few endovascular interventions.   In 2018 Dr. Randie Heinzain performed an angiogram and did no intervention, none needed.    She does have a history of knee replacement.    Dr. Randie Heinzain last evaluated pt on 01-06-18. At that time left femoropopliteal bypass, and has had several interventions on this. At that time the bypass was patent.  She had pain in her right lower extremity that Dr. Randie Heinzain thought was not related to any vascular issues.  She is okay to wear compression stockings on her left leg where she has chronic swelling that is bothersome.  Her vein on the left has been harvested so she does not have any room for venous intervention on that side.  She was to follow-up in 1 year with repeat studies.  She has no follow up scheduled. She had no lower extremity arterial testing prior to seeing me.  She returns today with c/o gradual onset right thigh to calf pain and left calf. She feels pain in her left calf with sitting, pain in both legs is worse with walking about 25-50 yards, eases up some with stopping.   She has no known back problems, has never had a back evaluation.   She has been walking daily for a total of an hour or more.  She used to play softball.   She denies any history of stroke or cardiac problems.   She has a long list of allergies.   Diabetic: Yes, most recent A1C result on file was 9.1 on 08-19-14, states her last A1C was 8.1. She uses an insulin pump.  Tobacco use: smoker  (1 ppd since 2000)  Pt meds include: Statin :Repatha, statin causes myalgias, states her cholesterol is good Betablocker: No ASA: Yes, 325 mg daily Other anticoagulants/antiplatelets: no   Past  Medical History:  Diagnosis Date  . ADHD (attention deficit hyperactivity disorder)   . Anxiety   . Diabetes mellitus type I (HCC)   . Diverticulosis of colon without hemorrhage   . DM (diabetes mellitus) (HCC)    post pancreatectomy  . GERD (gastroesophageal reflux disease)   . Hashimoto's thyroiditis    euthyroid  . Hypercholesterolemia   . PONV (postoperative nausea and vomiting)     Social History Social History   Tobacco Use  . Smoking status: Current Every Day Smoker    Packs/day: 1.00    Years: 20.00    Pack years: 20.00    Types: Cigarettes  . Smokeless tobacco: Never Used  . Tobacco comment: Has cut back to 1 pack a day, not ready to quit  Substance Use Topics  . Alcohol use: No    Alcohol/week: 0.0 standard drinks  . Drug use: No    Family History Family History  Problem Relation Age of Onset  . Pancreatic cancer Paternal Grandfather   . Cirrhosis Paternal Grandfather   . Pancreatitis Other        cousin  . Throat cancer Mother   . Depression Father   . Colon cancer Neg Hx     Past Surgical History:  Procedure Laterality Date  . ABDOMINAL AORTOGRAM W/LOWER EXTREMITY N/A 04/27/2017   Procedure: ABDOMINAL AORTOGRAM W/LOWER EXTREMITY;  Surgeon: Maeola Harmanain, Brandon Christopher,  MD;  Location: [facility];  Service: Cardiovascular;  Laterality: N/A;  . ABDOMINAL HYSTERECTOMY    . BIOPSY N/A 05/15/2014   Procedure: GASTIC,  ASCENDING, AND DESCEDNING/SIGMOID  COLON BIOPSY;  Surgeon: Daneil Dolin, MD;  Location: [facility];  Service: Endoscopy;  Laterality: N/A;  . CHOLECYSTECTOMY    . COLONOSCOPY WITH PROPOFOL N/A 05/15/2014   Dr. Gala Romney (primary GI is Dr. Oneida Alar). Hemorrhoids, negative random colon biopsies. GI pathogen panel negative. Diverticulosis.  [name] complete hysterectomy    . ESOPHAGOGASTRODUODENOSCOPY (EGD) WITH PROPOFOL N/A 05/15/2014   Dr. Gala Romney (primary GI Dr. Oneida Alar), Billroth II, inflamed residual gastric mucosa, benign biopsies  .  FEMORAL-POPLITEAL BYPASS GRAFT    . 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  [name] KNEE SURGERY Right    X 6-5 arthroscopies and open procedure 1 time- tighten patellar tendon and loose IT band  . 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    Allergies  Allergen Reactions  . Erythromycin Anaphylaxis, Hives and Nausea Only  . Levofloxacin Other (See Comments)    Burning during administration IV prior to surgery.Pt was told not to take it again.  . Niaspan [Niacin] Anaphylaxis  . Penicillins Anaphylaxis    Has patient had a PCN reaction causing immediate rash, facial/tongue/throat swelling, SOB or lightheadedness with hypotension: Yes Has patient had a PCN reaction causing severe rash involving mucus membranes or skin necrosis: No Has patient had a PCN reaction that required hospitalization: No Has patient had a PCN reaction occurring within the last 10 years: No If all of the above answers are "NO", then may proceed with Cephalosporin use.   . Shellfish Allergy Anaphylaxis  . Sulfa Antibiotics Anaphylaxis    "Blisters in my throat"  . Wellbutrin [Bupropion] Other (See Comments)    Suicidal thoughts while on Lexapro  . Morphine Other (See Comments)    ineffective  . Strawberry Extract Hives  . Sulfamethoxazole Other (See Comments)    Blister in throat, sores in mouth  . Tape Other (See Comments)    Transpore Tape causes skin to peel;  and Nicoderm Adhesive Patch cause blisters Transpore Tape causes skin to peel;  and Nicoderm Adhesive Patch cause blisters  . Cefdinir   . Doxycycline Hives and Nausea Only    A 100mg  tablet formulation caused hives, nausea, vomiting.  [name] Keflex [Cephalexin] Other (See Comments)    Hallucinations  . Statins Other (See Comments)    Muscle pain  . Valium [Diazepam] Other (See Comments)    Confusion and amnesia   . Vancomycin Hives and Nausea Only  . Zetia [Ezetimibe] Diarrhea  . 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 Morphine And Related Other (See Comments)    ineffective    Current Outpatient Medications  Medication Sig Dispense Refill  . aspirin EC 325 MG tablet Take 325 mg by mouth daily.    . Evolocumab (REPATHA) 140 MG/ML SOSY Inject 140 mg into the skin every 14 (fourteen) days.    [name] levothyroxine (SYNTHROID, LEVOTHROID) 25 MCG tablet Take 25 mcg by mouth daily before breakfast.    . lipase/protease/amylase (CREON) 36000 UNITS CPEP capsule Take by mouth.    Derrill Memo ON 12/22/2018] LORazepam (ATIVAN) 2 MG tablet Take 1 tablet (2 mg total) by mouth daily as needed for anxiety. 30 tablet 0  . pantoprazole (PROTONIX) 40 MG tablet Take 80 mg  by mouth at bedtime.     [name]. EPIPEN 2-PAK 0.3 MG/0.3ML SOAJ injection Inject 0.3 mg into the skin daily as needed (allergic reaction).     . insulin lispro (HUMALOG) 100 UNIT/ML injection Inject 60 units daily via insulin pump. (Patient not taking: Reported on 12/20/2018) 20 mL 11  . methylphenidate 18 MG PO CR tablet Take 1 tablet (18 mg total) by mouth daily. (Patient not taking: Reported on 12/20/2018) 30 tablet 0   No current facility-administered medications for this visit.     ROS: See HPI for pertinent positives and negatives.   Physical Examination  Vitals:   12/20/18 1122  BP: 121/86  Pulse: 93  Resp: 14  Temp: 99.4 F (37.4 C)  TempSrc: Temporal  SpO2: 97%  Weight: 135 lb (61.2 kg)  Height: 5\' 6"  (1.676 m)   Body mass index is 21.79 kg/m.  General: A&O x 3, WDWN, female. Gait: normal HENT: No gross abnormalities.  Eyes: PERRLA. Pulmonary: Respirations are non labored, CTAB, good air movement in all fields Cardiac: regular rhythm, no detected murmur.         Carotid Bruits Right Left   Negative Negative   Radial pulses are 2+ palpable bilaterally   Adominal aortic pulse is not palpable                         VASCULAR  EXAM: Extremities without ischemic changes, without Gangrene; without open wounds.                                                                                                          LE Pulses Right Left       FEMORAL  2+ palpable  1+ palpable        POPLITEAL  not palpable   not palpable       POSTERIOR TIBIAL  2+ palpable   not palpable        DORSALIS PEDIS      ANTERIOR TIBIAL faintly palpable  not palpable    Abdomen: soft, NT, no palpable masses. Skin: no rashes, no cellulitis, no ulcers noted. Musculoskeletal: no muscle wasting or atrophy.  Neurologic: A&O X 3; appropriate affect, Sensation is normal; MOTOR FUNCTION:  moving all extremities equally, motor strength 5/5 throughout. Speech is fluent/normal. CN 2-12 intact. Psychiatric: Thought content is normal, mood appropriate for clinical situation.     ASSESSMENT: Jessica FudgeRebecca L [name] is a 43 y.o. female with a history of left femoropopliteal bypass in 2018 at another healthcare system.  She subsequently had a few endovascular interventions.   In 2018 Dr. Randie Heinzain performed an angiogram and did no intervention, none needed.    She c/o pain in her right thigh and calf, pain in her left calf, worse with walking. She is walking daily for a total of an hour or more.   Left femoral pulse is 1+ palpable, right is 2+ palpable, right pedal pulse is palpable, left pedal pulses are not palpable. No recent arterial testing.  No signs of ischemia in  her feet or legs. She has left calf claudication sx's with no palpable left pedal pulses, 1+ left femoral pulse. She has claudication type sx's in her right thigh and calf after walking 25 yards, but has a palpable left pedal pulse and 2+ palpable right femoral pulse.  Will need left aortoiliac duplex and ABIs' ASAP, see me or Dr. Randie Heinzain afterward.   Her atherosclerotic risk factors include smoking 1 ppd and uncontrolled insulin dependent DM. She takes a daily 325 mg ASA and Repatha since she  is statin intolerant.   DATA None recently   PLAN:  Based on the patient's HPI and examination, pt will return to clinic on the next day that Dr. Randie Heinzain is in the office, have bilateral aortoiliac duplex and ABI's performed before she sees either Dr. Randie Heinzain or me.    Over 3 minutes was spent counseling patient re smoking cessation, and patient was given several free resources re smoking cessation.  At some point we should obtain a carotid duplex due to her personal hx of PAD and her mother's hx of needing bilateral CEA's before age 45. Pt continues to smoke and has uncontrolled DM.   I discussed in depth with the patient the nature of atherosclerosis, and emphasized the importance of maximal medical management including strict control of blood pressure, blood glucose, and lipid levels, obtaining regular exercise, and cessation of smoking.  The patient is aware that without maximal medical management the underlying atherosclerotic disease process will progress, limiting the benefit of any interventions.  The patient was given information about PAD including signs, symptoms, treatment, what symptoms should prompt the patient to seek immediate medical care, and risk reduction measures to take.  Charisse MarchSuzanne Sedra Morfin, RN, MSN, FNP-C [facility] [place] Office Phone: [phone]  Clinic MD: Edilia BoDickson  12/20/18 11:34 AM

## 2018-12-27 ENCOUNTER — Other Ambulatory Visit: Payer: Self-pay

## 2018-12-27 DIAGNOSIS — I779 Disorder of arteries and arterioles, unspecified: Secondary | ICD-10-CM

## 2018-12-27 DIAGNOSIS — M79605 Pain in left leg: Secondary | ICD-10-CM

## 2018-12-27 DIAGNOSIS — M79604 Pain in right leg: Secondary | ICD-10-CM

## 2018-12-29 ENCOUNTER — Other Ambulatory Visit: Payer: Self-pay

## 2018-12-29 ENCOUNTER — Encounter: Payer: Self-pay | Admitting: Vascular Surgery

## 2018-12-29 ENCOUNTER — Ambulatory Visit (INDEPENDENT_AMBULATORY_CARE_PROVIDER_SITE_OTHER)
Admission: RE | Admit: 2018-12-29 | Discharge: 2018-12-29 | Disposition: A | Discharge status: Home / Self Care | Payer: Medicare (Managed Care) | Source: Ambulatory Visit | Attending: Vascular Surgery | Admitting: Vascular Surgery

## 2018-12-29 ENCOUNTER — Ambulatory Visit (INDEPENDENT_AMBULATORY_CARE_PROVIDER_SITE_OTHER): Payer: 59 | Admitting: Vascular Surgery

## 2018-12-29 ENCOUNTER — Ambulatory Visit (HOSPITAL_COMMUNITY)
Admission: RE | Admit: 2018-12-29 | Discharge: 2018-12-29 | Disposition: A | Discharge status: Home / Self Care | Payer: Medicare (Managed Care) | Source: Ambulatory Visit | Attending: Vascular Surgery | Admitting: Vascular Surgery

## 2018-12-29 VITALS — BP 121/83 | HR 93 | Temp 97.5°F | Resp 18 | Ht 66.0 in | Wt 138.0 lb

## 2018-12-29 DIAGNOSIS — F17200 Nicotine dependence, unspecified, uncomplicated: Secondary | ICD-10-CM

## 2018-12-29 DIAGNOSIS — I779 Disorder of arteries and arterioles, unspecified: Secondary | ICD-10-CM

## 2018-12-29 DIAGNOSIS — M79605 Pain in left leg: Secondary | ICD-10-CM

## 2018-12-29 DIAGNOSIS — M79604 Pain in right leg: Secondary | ICD-10-CM | POA: Diagnosis present

## 2018-12-29 MED ORDER — CILOSTAZOL 100 MG PO TABS: 100.0000 mg | ORAL_TABLET | Freq: Two times a day (BID) | ORAL | 11 refills | Status: DC

## 2018-12-29 NOTE — Progress Notes (Signed)
Patient ID: Jessica FudgeRebecca L Delamater, female   DOB: March 19, 1976, 43 y.o.   MRN: [id number]  Reason for Consult: Follow-up   Referred by Virl DiamondPallone, Amanda W, MD  Subjective:     HPI:  Jessica [name] is a 43 y.o. female with a previous left femoral to above-knee body popliteal bypass.  This was intervened upon twice prior to arriving to me.  She was having left lower extremity pain.  She now presents with right lower extremity pain.  She states this is tightening in the calf after walking approximately 50 yards.  After she stops she can walk 40 yards gets progressively less with time.  She does not have any tissue loss or ulceration.  Not having any left lower extremity pain at this time.  She is never had a right lower extremity interventions.  Does have a history of left SFA stent and occluded.  She currently takes aspirin and statin.  She is on any blood thinners.  She is never tried Pletal.  She continues to smoke.  Currently on nicotine patches with attempt to quit with a goal to quit by January 1  Past Medical History:  Diagnosis Date  . ADHD (attention deficit hyperactivity disorder)   . Anxiety   . Diabetes mellitus type I (HCC)   . Diverticulosis of colon without hemorrhage   . DM (diabetes mellitus) (HCC)    post pancreatectomy  . GERD (gastroesophageal reflux disease)   . Hashimoto's thyroiditis    euthyroid  . Hypercholesterolemia   . PONV (postoperative nausea and vomiting)    Family History  Problem Relation Age of Onset  . Pancreatic cancer Paternal Grandfather   . Cirrhosis Paternal Grandfather   . Pancreatitis Other        cousin  . Throat cancer Mother   . Depression Father   . Colon cancer Neg Hx    Past Surgical History:  Procedure Laterality Date  . ABDOMINAL AORTOGRAM W/LOWER EXTREMITY N/A 04/27/2017   Procedure: ABDOMINAL AORTOGRAM W/LOWER EXTREMITY;  Surgeon: Maeola Harmanain, Brandon Christopher, MD;  [facility];  Service: Cardiovascular;  Laterality: N/A;  .  ABDOMINAL HYSTERECTOMY    . BIOPSY N/A 05/15/2014   Procedure: GASTIC,  ASCENDING, AND DESCEDNING/SIGMOID  COLON BIOPSY;  Surgeon: Corbin Adeobert M Rourk, MD;  Location: [facility];  Service: Endoscopy;  Laterality: N/A;  . CHOLECYSTECTOMY    . COLONOSCOPY WITH PROPOFOL N/A 05/15/2014   Dr. Jena Gaussourk (primary GI is Dr. Darrick Pennafields). Hemorrhoids, negative random colon biopsies. GI pathogen panel negative. Diverticulosis.  [name]. complete hysterectomy    . ESOPHAGOGASTRODUODENOSCOPY (EGD) WITH PROPOFOL N/A 05/15/2014   Dr. Jena Gaussourk (primary GI Dr. Darrick Pennafields), Billroth II, inflamed residual gastric mucosa, benign biopsies  . FEMORAL-POPLITEAL BYPASS GRAFT    . 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  [name]. KNEE SURGERY Right    X 6-5 arthroscopies and open procedure 1 time- tighten patellar tendon and loose IT band  . 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    Short Social History:  Social History   Tobacco Use  . Smoking status: Current Every Day Smoker    Packs/day: 1.00    Years: 20.00    Pack years: 20.00    Types: Cigarettes  . Smokeless tobacco: Never Used  . Tobacco comment: Has cut back to 1 pack a day, not ready to quit  Substance Use Topics  . Alcohol use: No    Alcohol/week: 0.0 standard drinks    Allergies  Allergen Reactions  . Erythromycin Anaphylaxis, Hives and Nausea Only  . Levofloxacin Other (See Comments)    Burning during administration IV prior to surgery.Pt was told not to take it again.  . Niaspan [Niacin] Anaphylaxis  . Penicillins Anaphylaxis    Has patient had a PCN reaction causing immediate rash, facial/tongue/throat swelling, SOB or lightheadedness with hypotension: Yes Has patient had a PCN reaction causing severe rash involving mucus membranes or skin necrosis: No Has patient had a PCN reaction that required hospitalization: No Has patient had a PCN reaction occurring within the last 10 years: No If all of the above answers are "NO",  then may proceed with Cephalosporin use.   . Shellfish Allergy Anaphylaxis  . Sulfa Antibiotics Anaphylaxis    "Blisters in my throat"  . Wellbutrin [Bupropion] Other (See Comments)    Suicidal thoughts while on Lexapro  . Morphine Other (See Comments)    ineffective  . Strawberry Extract Hives  . Sulfamethoxazole Other (See Comments)    Blister in throat, sores in mouth  . Tape Other (See Comments)    Transpore Tape causes skin to peel;  and Nicoderm Adhesive Patch cause blisters Transpore Tape causes skin to peel;  and Nicoderm Adhesive Patch cause blisters  . Cefdinir   . Doxycycline Hives and Nausea Only    A 100mg  tablet formulation caused hives, nausea, vomiting.  [name]. Keflex [Cephalexin] Other (See Comments)    Hallucinations  . Statins Other (See Comments)    Muscle pain  . Valium [Diazepam] Other (See Comments)    Confusion and amnesia   . Vancomycin Hives and Nausea Only  . Zetia [Ezetimibe] Diarrhea  . 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 Morphine And Related Other (See Comments)    ineffective    Current Outpatient Medications  Medication Sig Dispense Refill  . aspirin EC 325 MG tablet Take 325 mg by mouth daily.    [name]. EPIPEN 2-PAK 0.3 MG/0.3ML SOAJ injection Inject 0.3 mg into the skin daily as needed (allergic reaction).     . Evolocumab (REPATHA) 140 MG/ML SOSY Inject 140 mg into the skin every 14 (fourteen) days.    [name]. levothyroxine (SYNTHROID, LEVOTHROID) 25 MCG tablet Take 25 mcg by mouth daily before breakfast.    . lipase/protease/amylase (CREON) 36000 UNITS CPEP capsule Take by mouth.    [name]. LORazepam (ATIVAN) 2 MG tablet Take 1 tablet (2 mg total) by mouth daily as needed for anxiety. 30 tablet 0  . methylphenidate 18 MG PO CR tablet Take 1 tablet (18 mg total) by mouth daily. 30 tablet 0  . NOVOLOG 100 UNIT/ML injection Insulin pump    . pantoprazole (PROTONIX) 40 MG tablet  Take 80 mg by mouth at bedtime.      No current facility-administered medications for this visit.     Review of Systems  Constitutional:  Constitutional negative. HENT: HENT negative.  Eyes: Eyes negative.  Respiratory: Respiratory negative.  Cardiovascular: Positive for claudication.  GI: Gastrointestinal negative.  Musculoskeletal: Musculoskeletal negative.  Neurological: Neurological negative. Hematologic: Hematologic/lymphatic negative.  Psychiatric: Psychiatric negative.        Objective:  Objective   Vitals:   12/29/18 0902  BP: 121/83  Pulse: 93  Resp: 18  Temp: (!) 97.5 F (36.4 C)  TempSrc: Temporal  SpO2: 96%  Weight: 138 lb (62.6 kg)  Height: 5\' 6"  (1.676 m)   Body mass index  is 22.27 kg/m.  Physical Exam HENT:     Head: Normocephalic.     Mouth/Throat:     Mouth: Mucous membranes are moist.  Eyes:     Pupils: Pupils are equal, round, and reactive to light.  Neck:     Musculoskeletal: Normal range of motion and neck supple.  Cardiovascular:     Rate and Rhythm: Normal rate.     Pulses:          Femoral pulses are 2+ on the right side and 2+ on the left side.      Dorsalis pedis pulses are 1+ on the right side and 1+ on the left side.  Abdominal:     General: Abdomen is flat.     Palpations: Abdomen is soft. There is no mass.  Skin:    General: Skin is warm.     Capillary Refill: Capillary refill takes less than 2 seconds.  Neurological:     General: No focal deficit present.     Mental Status: She is alert.  Psychiatric:        Mood and Affect: Mood normal.        Behavior: Behavior normal.        Thought Content: Thought content normal.        Judgment: Judgment normal.     Data: I have independently interpreted her ABIs to be right 0.78 triphasic with toe pressure 130 on the left 1.02 and triphasic with toe pressure of 144.     Assessment/Plan:     43 year old female with history of left lower extremity bypass which appears patent  by today's exam.  She has palpable 1+ dorsalis pedis pulses bilaterally.  She does have pretty consistent symptoms of right lower extremity claudication and may be a walking test would demonstrate a drop off in her pressure.  Either way I do not think she is a good candidate for intervention right now given that she is still smoking and her young age.  I have counseled her to quit smoking and we have discussed this for greater than 3 minutes.  She is currently on nicotine patches.  I have sent Pletal to her pharmacy and discussed taking this with the caution of GI upset which may cause her to stop taking it.  States that she plans to be done smoking by January 1 and hopefully her symptoms will get better after this.  We have discussed walking through the pain which is safe.  She will follow-up after the January 1 date at which time hopefully she has quit smoking.  We will repeat ABIs at that time     Waynetta Sandy MD [facility]

## 2019-01-03 ENCOUNTER — Telehealth (HOSPITAL_COMMUNITY): Payer: Self-pay | Admitting: Psychiatry

## 2019-01-03 ENCOUNTER — Ambulatory Visit (HOSPITAL_COMMUNITY): Payer: 59 | Admitting: Psychiatry

## 2019-01-03 ENCOUNTER — Other Ambulatory Visit: Payer: Self-pay

## 2019-01-03 ENCOUNTER — Ambulatory Visit: Payer: Medicare (Managed Care) | Attending: Psychiatry | Admitting: Neurology

## 2019-01-03 DIAGNOSIS — F5104 Psychophysiologic insomnia: Secondary | ICD-10-CM | POA: Insufficient documentation

## 2019-01-03 DIAGNOSIS — Z794 Long term (current) use of insulin: Secondary | ICD-10-CM | POA: Insufficient documentation

## 2019-01-03 DIAGNOSIS — Z79890 Hormone replacement therapy (postmenopausal): Secondary | ICD-10-CM | POA: Insufficient documentation

## 2019-01-03 DIAGNOSIS — Z9641 Presence of insulin pump (external) (internal): Secondary | ICD-10-CM | POA: Diagnosis not present

## 2019-01-03 DIAGNOSIS — Z7982 Long term (current) use of aspirin: Secondary | ICD-10-CM | POA: Diagnosis not present

## 2019-01-03 DIAGNOSIS — Z79899 Other long term (current) drug therapy: Secondary | ICD-10-CM | POA: Insufficient documentation

## 2019-01-03 DIAGNOSIS — R0683 Snoring: Secondary | ICD-10-CM | POA: Insufficient documentation

## 2019-01-03 NOTE — Telephone Encounter (Signed)
Therapist sent patient twice for scheduled appointment, no response. Therapist called patient and left message indicating attempt to contact for scheduled appointment.

## 2019-01-04 ENCOUNTER — Other Ambulatory Visit (HOSPITAL_BASED_OUTPATIENT_CLINIC_OR_DEPARTMENT_OTHER): Payer: Self-pay

## 2019-01-04 DIAGNOSIS — G4700 Insomnia, unspecified: Secondary | ICD-10-CM

## 2019-01-05 NOTE — Procedures (Signed)
[place] A. Merlene Laughter, MD     [email or web address]             NOCTURNAL POLYSOMNOGRAPHY   LOCATION: [facility]   Patient Name: Jessica [name], Jessica [name] Date: 01/03/2019 Gender: Female D.O.B: 28-Feb-1976 Age (years): 48 Referring Provider: Norman Clay MD Height (inches): 66 Interpreting Physician: Phillips Odor MD, ABSM Weight (lbs): 135 RPSGT: Peak, Robert BMI: 22 MRN: [id number] Neck Size: 14.00 CLINICAL INFORMATION Sleep Study Type: NPSG     Indication for sleep study: Insomnia     Epworth Sleepiness Score: 8     SLEEP STUDY TECHNIQUE As per the AASM Manual for the Scoring of Sleep and Associated Events v2.3 (April 2016) with a hypopnea requiring 4% desaturations.  The channels recorded and monitored were frontal, central and occipital EEG, electrooculogram (EOG), submentalis EMG (chin), nasal and oral airflow, thoracic and abdominal wall motion, anterior tibialis EMG, snore microphone, electrocardiogram, and pulse oximetry.  MEDICATIONS  Current Outpatient Medications:  .  aspirin EC 325 MG tablet, Take 325 mg by mouth daily., Disp: , Rfl:  .  cilostazol (PLETAL) 100 MG tablet, Take 1 tablet (100 mg total) by mouth 2 (two) times daily before a meal., Disp: 60 tablet, Rfl: 11 .  EPIPEN 2-PAK 0.3 MG/0.3ML SOAJ injection, Inject 0.3 mg into the skin daily as needed (allergic reaction). , Disp: , Rfl:  .  Evolocumab (REPATHA) 140 MG/ML SOSY, Inject 140 mg into the skin every 14 (fourteen) days., Disp: , Rfl:  .  levothyroxine (SYNTHROID, LEVOTHROID) 25 MCG tablet, Take 25 mcg by mouth daily before breakfast., Disp: , Rfl:  .  lipase/protease/amylase (CREON) 36000 UNITS CPEP capsule, Take by mouth., Disp: , Rfl:  .  LORazepam (ATIVAN) 2 MG tablet, Take 1 tablet (2 mg total) by mouth daily as needed for anxiety., Disp: 30 tablet, Rfl: 0 .  methylphenidate 18 MG PO CR tablet, Take 1 tablet (18 mg total) by mouth daily., Disp: 30 tablet, Rfl: 0 .   NOVOLOG 100 UNIT/ML injection, Insulin pump, Disp: , Rfl:  .  pantoprazole (PROTONIX) 40 MG tablet, Take 80 mg by mouth at bedtime. , Disp: , Rfl:       SLEEP ARCHITECTURE The study was initiated at 9:26:36 PM and ended at 4:52:59 AM.  Sleep onset time was 121.5 minutes and the sleep efficiency was 31.2%%. The total sleep time was 139.4 minutes.  Stage REM latency was N/A minutes.  The patient spent 12.2%% of the night in stage N1 sleep, 87.8%% in stage N2 sleep, 0.0%% in stage N3 and 0% in REM.  Alpha intrusion was absent.  Supine sleep was 0.00%.  RESPIRATORY PARAMETERS The overall apnea/hypopnea index (AHI) was 0.0 per hour. There were 0 total apneas, including 0 obstructive, 0 central and 0 mixed apneas. There were 0 hypopneas and 3 RERAs.  The AHI during Stage REM sleep was N/A per hour.  AHI while supine was N/A per hour.  The mean oxygen saturation was 96.4%. The minimum SpO2 during sleep was 93.0%.  snoring was noted during this study.  CARDIAC DATA The 2 lead EKG demonstrated sinus rhythm. The mean heart rate was 78.6 beats per minute. Other EKG findings include: None.  LEG MOVEMENT DATA The total PLMS were 0 with a resulting PLMS index of 0.0. Associated arousal with leg movement index was 0.0.  IMPRESSIONS 1.  Abnormal sleep architecture is noted with a severely reduced sleep efficiency, absent slow-wave sleep and absent REM sleep. 2. No significant obstructive sleep apnea  occurred during this study.   Jessica RammingKofi A Whyatt Klinger, MD Diplomate, American Board of Sleep Medicine. ELECTRONICALLY SIGNED ON:  01/05/2019, 3:54 PM [facility] PH: [phone]   FX: [phone] ACCREDITED BY THE AMERICAN ACADEMY OF SLEEP MEDICINE
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
## 2019-01-15 ENCOUNTER — Ambulatory Visit (INDEPENDENT_AMBULATORY_CARE_PROVIDER_SITE_OTHER): Payer: 59 | Admitting: Psychiatry

## 2019-01-15 ENCOUNTER — Other Ambulatory Visit: Payer: Self-pay

## 2019-01-15 ENCOUNTER — Encounter (HOSPITAL_COMMUNITY): Payer: Self-pay | Admitting: Psychiatry

## 2019-01-15 DIAGNOSIS — F909 Attention-deficit hyperactivity disorder, unspecified type: Secondary | ICD-10-CM

## 2019-01-15 DIAGNOSIS — F5101 Primary insomnia: Secondary | ICD-10-CM

## 2019-01-15 MED ORDER — METHYLPHENIDATE HCL ER (OSM) 27 MG PO TBCR: 27.0000 mg | EXTENDED_RELEASE_TABLET | Freq: Every day | ORAL | 0 refills | Status: DC

## 2019-01-15 MED ORDER — METHYLPHENIDATE HCL ER (OSM) 27 MG PO TBCR: 27.0000 mg | EXTENDED_RELEASE_TABLET | ORAL | 0 refills | Status: DC

## 2019-01-15 NOTE — Patient Instructions (Signed)
1. Increase concerta 27 mg daily  2. Next appointment 9/21 at 2:30 for 20 mins, video 3. (Hold lorazepam)

## 2019-01-16 ENCOUNTER — Ambulatory Visit (INDEPENDENT_AMBULATORY_CARE_PROVIDER_SITE_OTHER): Payer: 59 | Admitting: Psychiatry

## 2019-01-16 ENCOUNTER — Other Ambulatory Visit: Payer: Self-pay

## 2019-01-16 DIAGNOSIS — F325 Major depressive disorder, single episode, in full remission: Secondary | ICD-10-CM | POA: Diagnosis not present
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
## 2019-02-06 ENCOUNTER — Ambulatory Visit (HOSPITAL_COMMUNITY): Payer: 59 | Admitting: Psychiatry

## 2019-02-06 ENCOUNTER — Telehealth (HOSPITAL_COMMUNITY): Payer: Self-pay | Admitting: Psychiatry

## 2019-02-06 ENCOUNTER — Other Ambulatory Visit: Payer: Self-pay

## 2019-02-06 NOTE — Telephone Encounter (Signed)
Therapist attempted to contact patient twice via text for scheduled appointment, no response. Therapist called patient and left message indicating attempt and requesting patient call office.  

## 2019-02-27 ENCOUNTER — Other Ambulatory Visit: Payer: Self-pay

## 2019-02-27 ENCOUNTER — Ambulatory Visit (HOSPITAL_COMMUNITY): Payer: Medicare (Managed Care) | Admitting: Psychiatry

## 2019-02-27 ENCOUNTER — Telehealth (HOSPITAL_COMMUNITY): Payer: Self-pay | Admitting: Psychiatry

## 2019-02-27 NOTE — Telephone Encounter (Signed)
Therapist attempted to contact patient twice via text message, no response. Therapist called patient and left message indicating attempt and requesting patient contact office.
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
## 2019-03-12 ENCOUNTER — Ambulatory Visit (INDEPENDENT_AMBULATORY_CARE_PROVIDER_SITE_OTHER): Payer: 59 | Admitting: Psychiatry

## 2019-03-12 ENCOUNTER — Other Ambulatory Visit: Payer: Self-pay

## 2019-03-12 ENCOUNTER — Encounter (HOSPITAL_COMMUNITY): Payer: Self-pay | Admitting: Psychiatry

## 2019-03-12 DIAGNOSIS — F909 Attention-deficit hyperactivity disorder, unspecified type: Secondary | ICD-10-CM | POA: Diagnosis not present

## 2019-03-12 DIAGNOSIS — F325 Major depressive disorder, single episode, in full remission: Secondary | ICD-10-CM

## 2019-03-12 MED ORDER — METHYLPHENIDATE HCL ER (OSM) 27 MG PO TBCR: 27.0000 mg | EXTENDED_RELEASE_TABLET | Freq: Every day | ORAL | 0 refills | Status: DC

## 2019-03-12 NOTE — Patient Instructions (Signed)
1. Continue Concerta 27 mg daily   2.Next appointment: in 3 months

## 2019-03-14 ENCOUNTER — Ambulatory Visit (INDEPENDENT_AMBULATORY_CARE_PROVIDER_SITE_OTHER): Payer: 59 | Admitting: Psychiatry

## 2019-03-14 ENCOUNTER — Other Ambulatory Visit: Payer: Self-pay

## 2019-03-14 ENCOUNTER — Encounter (HOSPITAL_COMMUNITY): Payer: Self-pay | Admitting: Psychiatry

## 2019-03-14 DIAGNOSIS — F325 Major depressive disorder, single episode, in full remission: Secondary | ICD-10-CM
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
## 2019-03-20 ENCOUNTER — Ambulatory Visit (HOSPITAL_COMMUNITY): Payer: 59 | Admitting: Psychiatry

## 2019-05-31 ENCOUNTER — Telehealth: Payer: Self-pay

## 2019-05-31 NOTE — Telephone Encounter (Signed)
Pt called and said that she is having increased pain in her legs. She said that it has gotten much worse than it was when she was here last. She said that she has cut back on her smoking quite a bit but her issues with her legs are only continuing to get worse. She said that she can only walk about 2ft before she is having to rest due to pain.   Appt made for her to be seen tomorrow.   York Cerise, CMA

## 2019-06-01 ENCOUNTER — Ambulatory Visit (INDEPENDENT_AMBULATORY_CARE_PROVIDER_SITE_OTHER): Payer: Medicare (Managed Care) | Admitting: Physician Assistant

## 2019-06-01 ENCOUNTER — Other Ambulatory Visit: Payer: Self-pay

## 2019-06-01 ENCOUNTER — Other Ambulatory Visit (HOSPITAL_COMMUNITY)
Admission: RE | Admit: 2019-06-01 | Discharge: 2019-06-01 | Disposition: A | Discharge status: Home / Self Care | Payer: Medicare (Managed Care) | Source: Ambulatory Visit | Attending: Vascular Surgery | Admitting: Vascular Surgery

## 2019-06-01 VITALS — BP 137/82 | HR 100 | Temp 98.2°F | Resp 20 | Ht 66.0 in | Wt 148.0 lb

## 2019-06-01 DIAGNOSIS — Z01812 Encounter for preprocedural laboratory examination: Secondary | ICD-10-CM | POA: Diagnosis present

## 2019-06-01 DIAGNOSIS — I739 Peripheral vascular disease, unspecified: Secondary | ICD-10-CM | POA: Diagnosis not present

## 2019-06-01 DIAGNOSIS — Z20828 Contact with and (suspected) exposure to other viral communicable diseases: Secondary | ICD-10-CM | POA: Insufficient documentation

## 2019-06-01 LAB — SARS CORONAVIRUS 2 (TAT 6-24 HRS): SARS Coronavirus 2: NEGATIVE

## 2019-06-01 NOTE — Progress Notes (Signed)
HISTORY AND PHYSICAL     CC:  follow up. Requesting Provider:  Virl Diamond, MD  HPI: This is a 43 y.o. female who is here today for follow up.  She has a hx of left femoral to above knee popliteal bypass and was intervened on twice prior to being referred to Dr. Randie Heinz.  She also has hx of left SFA stent that occluded.   She was seen by Dr. Randie Heinz in July 2020 and had 1+ DP pulses bilaterally.  She also had sx's consistent with RLE claudication .  He did not think she was a good candidate for intervention given that she was still smoking.  She was given an rx for Pletal.  She did not get any relief from this so she stopped it after 3 months.  She did have a plan to have quit smoking by January 1 and plans to repeat ABI's at that time.  She is down to 5 cigarettes per day from 2 ppd.  The pt returns today with c/o pain in her right calf and thigh when she walks for about 15 feet.  This came on suddenly about 2-3 weeks ago.  She states that her right foot has been staying cold for about 2 months and she has to wear 2 socks on that foot.  She states she does have a wound on the bottom of her foot that has been present for about 2 months.  She states that she has been seeing the foot doctor.  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    The pt is not on a statin for cholesterol management.    (allergy) She is on Ripatha, which has greatly reduced her TG's The pt is on an aspirin.    Other AC:  none The pt is not on meds for hypertension.  The pt does have diabetes. Tobacco hx:  Current - see above   Past Medical History:  Diagnosis Date  . ADHD (attention deficit hyperactivity disorder)   . Anxiety   . Diabetes mellitus type I (HCC)   . Diverticulosis of colon without hemorrhage   . DM (diabetes mellitus) (HCC)    post pancreatectomy  . GERD (gastroesophageal reflux disease)   . Hashimoto's thyroiditis    euthyroid  .  Hypercholesterolemia   . PONV (postoperative nausea and vomiting)     Past Surgical History:  Procedure Laterality Date  . ABDOMINAL AORTOGRAM W/LOWER EXTREMITY N/A 04/27/2017   Procedure: ABDOMINAL AORTOGRAM W/LOWER EXTREMITY;  Surgeon: Maeola Harman, MD;  Location: [facility] INVASIVE CV LAB;  Service: Cardiovascular;  Laterality: N/A;  . ABDOMINAL HYSTERECTOMY    . BIOPSY N/A 05/15/2014   Procedure: GASTIC,  ASCENDING, AND DESCEDNING/SIGMOID  COLON BIOPSY;  Surgeon: Corbin Ade, MD;  Location: [facility] ORS;  Service: Endoscopy;  Laterality: N/A;  . CHOLECYSTECTOMY    . COLONOSCOPY WITH PROPOFOL N/A 05/15/2014   Dr. Jena Gauss (primary GI is Dr. Darrick Penna). Hemorrhoids, negative random colon biopsies. GI pathogen panel negative. Diverticulosis.  [name] complete hysterectomy    . ESOPHAGOGASTRODUODENOSCOPY (EGD) WITH PROPOFOL N/A 05/15/2014   Dr. Jena Gauss (primary GI Dr. Darrick Penna), Billroth II, inflamed residual gastric mucosa, benign biopsies  . FEMORAL-POPLITEAL BYPASS GRAFT    . 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  [name] KNEE SURGERY Right    X 6-5 arthroscopies and open procedure 1 time- tighten patellar tendon and loose IT band  . PANCREATECTOMY  11/2013  with splenectomy/hepaticojejunostomy and gastrojejunostomy.    Allergies  Allergen Reactions  . Erythromycin Anaphylaxis, Hives and Nausea Only  . Levofloxacin Other (See Comments)    Burning during administration IV prior to surgery.Pt was told not to take it again.  . Niaspan [Niacin] Anaphylaxis  . Penicillins Anaphylaxis    Has patient had a PCN reaction causing immediate rash, facial/tongue/throat swelling, SOB or lightheadedness with hypotension: Yes Has patient had a PCN reaction causing severe rash involving mucus membranes or skin necrosis: No Has patient had a PCN reaction that required hospitalization: No Has patient had a PCN reaction occurring within the last 10 years: No If all of the above answers are "NO", then may proceed  with Cephalosporin use.   . Shellfish Allergy Anaphylaxis  . Sulfa Antibiotics Anaphylaxis    "Blisters in my throat"  . Wellbutrin [Bupropion] Other (See Comments)    Suicidal thoughts while on Lexapro  . Morphine Other (See Comments)    ineffective  . Strawberry Extract Hives  . Sulfamethoxazole Other (See Comments)    Blister in throat, sores in mouth  . Tape Other (See Comments)    Transpore Tape causes skin to peel;  and Nicoderm Adhesive Patch cause blisters Transpore Tape causes skin to peel;  and Nicoderm Adhesive Patch cause blisters  . Cefdinir   . Doxycycline Hives and Nausea Only    A 100mg  tablet formulation caused hives, nausea, vomiting.  Keflex [Cephalexin] Other (See Comments)    Hallucinations  . Statins Other (See Comments)    Muscle pain  . Valium [Diazepam] Other (See Comments)    Confusion and amnesia   . Vancomycin Hives and Nausea Only  . Zetia [Ezetimibe] Diarrhea  . 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 Morphine And Related Other (See Comments)    ineffective    Current Outpatient Medications  Medication Sig Dispense Refill  . ALPRAZolam (XANAX) 1 MG tablet Take 1 mg by mouth at bedtime as needed for anxiety.    [name] aspirin EC 325 MG tablet Take 325 mg by mouth daily.    . cilostazol (PLETAL) 100 MG tablet Take 1 tablet (100 mg total) by mouth 2 (two) times daily before a meal. (Patient not taking: Reported on 03/14/2019) 60 tablet 11  . diphenoxylate-atropine (LOMOTIL) 2.5-0.025 MG tablet Take 2 tablets by mouth at bedtime.    09-07-1989 EPIPEN 2-PAK 0.3 MG/0.3ML SOAJ injection Inject 0.3 mg into the skin daily as needed (allergic reaction).     . Evolocumab (REPATHA) 140 MG/ML SOSY Inject 140 mg into the skin every 14 (fourteen) days.    [name] levothyroxine (SYNTHROID, LEVOTHROID) 25 MCG tablet Take 25 mcg by mouth daily before breakfast.    . lipase/protease/amylase (CREON)  36000 UNITS CPEP capsule Take by mouth.    . methylphenidate (CONCERTA) 27 MG PO CR tablet Take 1 tablet (27 mg total) by mouth daily before breakfast. 30 tablet 0  . methylphenidate (CONCERTA) 27 MG PO CR tablet Take 1 tablet (27 mg total) by mouth daily before breakfast. 30 tablet 0  . methylphenidate (CONCERTA) 27 MG PO CR tablet Take 1 tablet (27 mg total) by mouth daily before breakfast. 30 tablet 0  . NOVOLOG 100 UNIT/ML injection Insulin pump    . pantoprazole (PROTONIX) 40 MG tablet Take 80 mg by mouth at bedtime.      No current facility-administered medications for this visit.  Family History  Problem Relation Age of Onset  . Pancreatic cancer Paternal Grandfather   . Cirrhosis Paternal Grandfather   . Pancreatitis Other        cousin  . Throat cancer Mother   . Depression Father   . Colon cancer Neg Hx     Social History   Socioeconomic History  . Marital status: Married    Spouse name: Not on file  . Number of children: 0  . Years of education: Not on file  . Highest education level: Not on file  Occupational History  . Occupation: Associate Professorpharmacy tech  Tobacco Use  . Smoking status: Current Every Day Smoker    Packs/day: 0.50    Years: 20.00    Pack years: 10.00    Types: Cigarettes  . Smokeless tobacco: Never Used  . Tobacco comment: Has cut back to 1/2 pack a day, plans to quit by January 2021  Substance and Sexual Activity  . Alcohol use: No    Alcohol/week: 0.0 standard drinks  . Drug use: No  . Sexual activity: Yes    Birth control/protection: None  Other Topics Concern  . Not on file  Social History Narrative  . Not on file   Social Determinants of Health   Financial Resource Strain:   . Difficulty of Paying Living Expenses: Not on file  Food Insecurity:   . Worried About Programme researcher, broadcasting/film/videounning Out of Food in the Last Year: Not on file  . Ran Out of Food in the Last Year: Not on file  Transportation Needs:   . Lack of Transportation (Medical): Not on file  .  Lack of Transportation (Non-Medical): Not on file  Physical Activity:   . Days of Exercise per Week: Not on file  . Minutes of Exercise per Session: Not on file  Stress:   . Feeling of Stress : Not on file  Social Connections:   . Frequency of Communication with Friends and Family: Not on file  . Frequency of Social Gatherings with Friends and Family: Not on file  . Attends Religious Services: Not on file  . Active Member of Clubs or Organizations: Not on file  . Attends BankerClub or Organization Meetings: Not on file  . Marital Status: Not on file  Intimate Partner Violence:   . Fear of Current or Ex-Partner: Not on file  . Emotionally Abused: Not on file  . Physically Abused: Not on file  . Sexually Abused: Not on file     REVIEW OF SYSTEMS:   [X]  denotes positive finding, [ ]  denotes negative finding Cardiac  Comments:  Chest pain or chest pressure:    Shortness of breath upon exertion:    Short of breath when lying flat:    Irregular heart rhythm:        Vascular    Pain in calf, thigh, or hip brought on by ambulation: x See HPI  Pain in feet at night that wakes you up from your sleep:     Blood clot in your veins:    Leg swelling:         Pulmonary    Oxygen at home:    Productive cough:     Wheezing:         Neurologic    Sudden weakness in arms or legs:     Sudden numbness in arms or legs:     Sudden onset of difficulty speaking or slurred speech:    Temporary loss of vision in one eye:  Problems with dizziness:         Gastrointestinal    Blood in stool:     Vomited blood:         Genitourinary    Burning when urinating:     Blood in urine:        Psychiatric    Major depression:         Hematologic    Bleeding problems:    Problems with blood clotting too easily:        Skin    Rashes or ulcers:        Constitutional    Fever or chills:      PHYSICAL EXAMINATION:  Today's Vitals   06/01/19 1406  BP: 137/82  Pulse: 100  Resp: 20    Temp: 98.2 F (36.8 C)  SpO2: 97%  Weight: 148 lb (67.1 kg)  Height: 5\' 6"  (1.676 m)  PainSc: 10-Worst pain ever   Body mass index is 23.89 kg/m.   General:  WDWN in NAD; vital signs documented above Gait: Not observed HENT: WNL, normocephalic Pulmonary: normal non-labored breathing , without Rales, rhonchi,  wheezing Cardiac: regular HR, without  Murmurs; without carotid bruits Abdomen: soft, NT, no masses Skin: without rashes;  Healed scar over right knee, and left thigh/groin.  Healed laparotomy scar.  Vascular Exam/Pulses:  Right Left  Radial 2+ (normal) 2+ (normal)  Ulnar 2+ (normal) 2+ (normal)  Femoral Unable to palpate but there is a doppler signal present  1+ (weak)  DP Faint doppler signal 2+ (normal)  PT absent 2+ (normal)  Peroneal absent Brisk doppler signal   Extremities: without ischemic changes, without Gangrene , without cellulitis; without open wounds; small wound on plantar surface-appears to be ? Splinter. Musculoskeletal: no muscle wasting or atrophy  Neurologic: A&O X 3;  No focal weakness or paresthesias are detected Psychiatric:  The pt has Normal affect.     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 female here for follow up for lifestyle limiting claudication in the right leg and has hx of left femoral to above knee popliteal bypass and was intervened on twice prior to being referred to Dr. Donzetta Matters.  She also has hx of left SFA stent that occluded as well as RLE claudication.  -pt has lifestyle limiting claudication in the right calf and thigh and an non healing wound on the plantar surface of the right foot that has been present for about 2 months.  She did mention aortobifemoral bypass that Dr. Donzetta Matters had talked to her in the past about.  She does have hx of laparotomy.   -given that, and I cannot palpate her right femoral pulse (with hx of right CIA  stenosis) and she has a faint DP doppler signal (palpable at last visit in July) on the right and non healing wound, will set up for arteriogram with runoff and possible intervention with Dr. Donzetta Matters on Monday.  -she is working towards quitting smoking.  She is down to 5 cigarettes per day.  Discussed with her the importance of complete cessation.   -she continues her asa.  She is allergic to statins, but is taking Ripatha, which has greatly helped reduce her TG's   Leontine Locket, PA-C [facility] [phone]  Clinic MD:   Donzetta Matters on call MD

## 2019-06-04 ENCOUNTER — Ambulatory Visit (HOSPITAL_COMMUNITY)
Admission: RE | Admit: 2019-06-04 | Discharge: 2019-06-04 | Disposition: A | Discharge status: Home / Self Care | Payer: Medicare (Managed Care) | Attending: Vascular Surgery | Admitting: Vascular Surgery

## 2019-06-04 ENCOUNTER — Other Ambulatory Visit: Payer: Self-pay

## 2019-06-04 ENCOUNTER — Encounter (HOSPITAL_COMMUNITY)
Admission: RE | Disposition: A | Discharge status: Home / Self Care | Payer: Self-pay | Source: Home / Self Care | Attending: Vascular Surgery

## 2019-06-04 DIAGNOSIS — E063 Autoimmune thyroiditis: Secondary | ICD-10-CM | POA: Diagnosis not present

## 2019-06-04 DIAGNOSIS — Z880 Allergy status to penicillin: Secondary | ICD-10-CM | POA: Diagnosis not present

## 2019-06-04 DIAGNOSIS — I70221 Atherosclerosis of native arteries of extremities with rest pain, right leg: Secondary | ICD-10-CM | POA: Insufficient documentation

## 2019-06-04 DIAGNOSIS — Z882 Allergy status to sulfonamides status: Secondary | ICD-10-CM | POA: Diagnosis not present

## 2019-06-04 DIAGNOSIS — E7800 Pure hypercholesterolemia, unspecified: Secondary | ICD-10-CM | POA: Insufficient documentation

## 2019-06-04 DIAGNOSIS — K219 Gastro-esophageal reflux disease without esophagitis: Secondary | ICD-10-CM | POA: Insufficient documentation

## 2019-06-04 DIAGNOSIS — Z881 Allergy status to other antibiotic agents status: Secondary | ICD-10-CM | POA: Insufficient documentation

## 2019-06-04 DIAGNOSIS — Z885 Allergy status to narcotic agent status: Secondary | ICD-10-CM | POA: Diagnosis not present

## 2019-06-04 DIAGNOSIS — E10621 Type 1 diabetes mellitus with foot ulcer: Secondary | ICD-10-CM | POA: Insufficient documentation

## 2019-06-04 DIAGNOSIS — Z888 Allergy status to other drugs, medicaments and biological substances status: Secondary | ICD-10-CM | POA: Diagnosis not present

## 2019-06-04 DIAGNOSIS — Z95828 Presence of other vascular implants and grafts: Secondary | ICD-10-CM | POA: Insufficient documentation

## 2019-06-04 DIAGNOSIS — F17210 Nicotine dependence, cigarettes, uncomplicated: Secondary | ICD-10-CM | POA: Diagnosis not present

## 2019-06-04 DIAGNOSIS — Z79890 Hormone replacement therapy (postmenopausal): Secondary | ICD-10-CM | POA: Diagnosis not present

## 2019-06-04 DIAGNOSIS — Z7982 Long term (current) use of aspirin: Secondary | ICD-10-CM | POA: Diagnosis not present

## 2019-06-04 DIAGNOSIS — Z90410 Acquired total absence of pancreas: Secondary | ICD-10-CM | POA: Diagnosis not present

## 2019-06-04 DIAGNOSIS — L97519 Non-pressure chronic ulcer of other part of right foot with unspecified severity: Secondary | ICD-10-CM | POA: Diagnosis not present

## 2019-06-04 DIAGNOSIS — Z79899 Other long term (current) drug therapy: Secondary | ICD-10-CM | POA: Insufficient documentation

## 2019-06-04 DIAGNOSIS — Z794 Long term (current) use of insulin: Secondary | ICD-10-CM | POA: Diagnosis not present

## 2019-06-04 HISTORY — PX: PERIPHERAL VASCULAR INTERVENTION: CATH118257

## 2019-06-04 HISTORY — PX: ABDOMINAL AORTOGRAM W/LOWER EXTREMITY: CATH118223

## 2019-06-04 LAB — POCT I-STAT, CHEM 8
BUN: 8 mg/dL (ref 6–20)
Calcium, Ion: 1.15 mmol/L (ref 1.15–1.40)
Chloride: 102 mmol/L (ref 98–111)
Creatinine, Ser: 0.6 mg/dL (ref 0.44–1.00)
Glucose, Bld: 219 mg/dL — ABNORMAL HIGH (ref 70–99)
HCT: 44 % (ref 36.0–46.0)
Hemoglobin: 15 g/dL (ref 12.0–15.0)
Potassium: 3.9 mmol/L (ref 3.5–5.1)
Sodium: 141 mmol/L (ref 135–145)
TCO2: 30 mmol/L (ref 22–32)

## 2019-06-04 LAB — POCT ACTIVATED CLOTTING TIME
Activated Clotting Time: 268 seconds
Activated Clotting Time: 158 seconds

## 2019-06-04 LAB — GLUCOSE, CAPILLARY: Glucose-Capillary: 200 mg/dL — ABNORMAL HIGH (ref 70–99)

## 2019-06-04 SURGERY — ABDOMINAL AORTOGRAM W/LOWER EXTREMITY
Anesthesia: LOCAL | Laterality: Right

## 2019-06-04 MED ORDER — CLOPIDOGREL BISULFATE 300 MG PO TABS
ORAL_TABLET | ORAL | Status: DC | PRN
  Administered 2019-06-04: 300 mg via ORAL

## 2019-06-04 MED ORDER — CLOPIDOGREL BISULFATE 300 MG PO TABS
ORAL_TABLET | ORAL | Status: AC
  Filled 2019-06-04: qty 1

## 2019-06-04 MED ORDER — HEPARIN (PORCINE) IN NACL 1000-0.9 UT/500ML-% IV SOLN
INTRAVENOUS | Status: DC | PRN
  Administered 2019-06-04 (×2): 500 mL

## 2019-06-04 MED ORDER — HEPARIN (PORCINE) IN NACL 1000-0.9 UT/500ML-% IV SOLN
INTRAVENOUS | Status: AC
  Filled 2019-06-04: qty 1000

## 2019-06-04 MED ORDER — SODIUM CHLORIDE 0.9 % WEIGHT BASED INFUSION: 1.0000 mL/kg/h | INTRAVENOUS | Status: DC

## 2019-06-04 MED ORDER — HEPARIN SODIUM (PORCINE) 1000 UNIT/ML IJ SOLN
INTRAMUSCULAR | Status: DC | PRN
  Administered 2019-06-04: 7000 [IU] via INTRAVENOUS

## 2019-06-04 MED ORDER — LIDOCAINE HCL (PF) 1 % IJ SOLN
INTRAMUSCULAR | Status: DC | PRN
  Administered 2019-06-04 (×2): 15 mL via INTRADERMAL

## 2019-06-04 MED ORDER — HYDROMORPHONE HCL 1 MG/ML IJ SOLN
INTRAMUSCULAR | Status: AC
  Filled 2019-06-04: qty 1

## 2019-06-04 MED ORDER — SODIUM CHLORIDE 0.9 % IV SOLN
INTRAVENOUS | Status: DC
  Administered 2019-06-04: 07:00:00 via INTRAVENOUS

## 2019-06-04 MED ORDER — FENTANYL CITRATE (PF) 100 MCG/2ML IJ SOLN
INTRAMUSCULAR | Status: AC
  Filled 2019-06-04: qty 2

## 2019-06-04 MED ORDER — HEPARIN SODIUM (PORCINE) 1000 UNIT/ML IJ SOLN
INTRAMUSCULAR | Status: AC
  Filled 2019-06-04: qty 1

## 2019-06-04 MED ORDER — IODIXANOL 320 MG/ML IV SOLN
INTRAVENOUS | Status: DC | PRN
  Administered 2019-06-04: 09:00:00 140 mL via INTRA_ARTERIAL

## 2019-06-04 MED ORDER — OXYCODONE HCL 5 MG PO TABS
5.0000 mg | ORAL_TABLET | ORAL | Status: DC | PRN
  Administered 2019-06-04: 10 mg via ORAL

## 2019-06-04 MED ORDER — MIDAZOLAM HCL 2 MG/2ML IJ SOLN
INTRAMUSCULAR | Status: DC | PRN
  Administered 2019-06-04 (×4): 1 mg via INTRAVENOUS

## 2019-06-04 MED ORDER — LIDOCAINE HCL (PF) 1 % IJ SOLN
INTRAMUSCULAR | Status: AC
  Filled 2019-06-04: qty 30

## 2019-06-04 MED ORDER — FENTANYL CITRATE (PF) 100 MCG/2ML IJ SOLN
INTRAMUSCULAR | Status: DC | PRN
  Administered 2019-06-04 (×4): 25 ug via INTRAVENOUS

## 2019-06-04 MED ORDER — OXYCODONE HCL 5 MG PO TABS
ORAL_TABLET | ORAL | Status: AC
  Filled 2019-06-04: qty 2

## 2019-06-04 MED ORDER — CLOPIDOGREL BISULFATE 75 MG PO TABS: 75.0000 mg | ORAL_TABLET | Freq: Every day | ORAL | 3 refills | Status: DC

## 2019-06-04 MED ORDER — MIDAZOLAM HCL 2 MG/2ML IJ SOLN
INTRAMUSCULAR | Status: AC
  Filled 2019-06-04: qty 2

## 2019-06-04 MED ORDER — HYDROMORPHONE HCL 1 MG/ML IJ SOLN
0.5000 mg | INTRAMUSCULAR | Status: DC | PRN
  Administered 2019-06-04 (×2): 1 mg via INTRAVENOUS
  Filled 2019-06-04: qty 1

## 2019-06-04 SURGICAL SUPPLY — 16 items
CATH OMNI FLUSH 5F 65CM (CATHETERS) ×3 IMPLANT
CATH STRAIGHT 5FR 65CM (CATHETERS) ×3 IMPLANT
GLIDEWIRE ADV .035X260CM (WIRE) ×3 IMPLANT
KIT ENCORE 26 ADVANTAGE (KITS) ×3 IMPLANT
KIT MICROPUNCTURE NIT STIFF (SHEATH) ×6 IMPLANT
KIT PV (KITS) ×3 IMPLANT
SHEATH BRITE TIP 7FR 35CM (SHEATH) ×3 IMPLANT
SHEATH PINNACLE 5F 10CM (SHEATH) ×3 IMPLANT
SHEATH PINNACLE 6F 10CM (SHEATH) ×3 IMPLANT
SHEATH PROBE COVER 6X72 (BAG) ×3 IMPLANT
STENT VIABAHN VBX 6X39X135 (Permanent Stent) ×3 IMPLANT
SYR MEDRAD MARK V 150ML (SYRINGE) ×3 IMPLANT
TRANSDUCER W/STOPCOCK (MISCELLANEOUS) ×3 IMPLANT
TRAY PV CATH (CUSTOM PROCEDURE TRAY) ×3 IMPLANT
WIRE BENTSON .035X145CM (WIRE) ×3 IMPLANT
WIRE ROSEN-J .035X180CM (WIRE) ×3 IMPLANT

## 2019-06-04 NOTE — H&P (Signed)
   History and Physical Update  The patient was interviewed and re-examined.  The patient's previous History and Physical has been reviewed and is unchanged from recent office visit. Plan for aortogram with possible intervention on the right.  Emeric Novinger C. Donzetta Matters, MD [facility] [place] Office: [phone] Pager: [phone]  06/04/2019, 7:06 AM

## 2019-06-04 NOTE — Progress Notes (Addendum)
Site area: left groin fa sheath pulled by Dalbert Batman Site Prior to Removal:  Level 0 Pressure Applied For:  20 minutes Manual:   yes Patient Status During Pull:  stable Post Pull Site:  Level  0 Post Pull Instructions Given:  yes Post Pull Pulses Present: left dp and pt dopplered Dressing Applied:  Gauze and tegaderm Bedrest begins @ 1130 Comments:

## 2019-06-04 NOTE — Op Note (Signed)
    Patient name: Jessica [name] MRN: [id number] DOB: December 30, 1975 Sex: female  06/04/2019 Pre-operative Diagnosis: Right lower extremity rest pain Post-operative diagnosis:  Same Surgeon:  Erlene Quan C. Donzetta Matters, MD Procedure Performed: 1.  Ultrasound-guided cannulation left common femoral artery 2.  Aortogram with bilateral lower extremity runoff 3.  Pullback aortic pressure gradient measured at 5 mmHg systolic 4.  Ultrasound-guided cannulation right common femoral artery 5.  Stent of right common iliac artery with 6 x 39 mm VBX 6.  Moderate sedation with fentanyl and Versed for 40 minutes  Indications: 43 year old female has undergone stenting of her left SFA followed by femoropopliteal bypass.  She previously had preserved ABIs throughout.  She now has right lower extremity rest pain that is acute in nature.  She is indicated for angiogram possible intervention.  She has no palpable common femoral pulse her left side is still palpable and she has a palpable posterior tibial pulse on the left.  Findings: The aorta appeared to have disease distally.  Pullback gradient was 5 mmHg only.  Right common iliac artery was subtotally occluded appear to be some subacute thrombus there.  Bypass on the left side is patent.  Right SFA popliteal patent.  Three-vessel runoff on both sides to the ankles.  Following stenting of the right common iliac artery there is no further stenosis there is pulsatility the common femoral artery.   Procedure:  The patient was identified in the holding area and taken to room 8.  The patient was then placed supine on the table and prepped and draped in the usual sterile fashion.  A time out was called.  Ultrasound was used to evaluate the left common femoral artery.  There is significant scar tissue.  There is anesthetized 1% lidocaine cannulated with direct ultrasound visualization a micropuncture needle followed the wire and sheath.  An image was saved for the permanent record.  We  used a Rosen wire serially dilated the tract.  We then placed a 5 French sheath.  We placed a Bentson into the aorta placed on the catheter level of L1.  We performed aortogram.  We then performed a dedicated aortic imaging demonstrated stenosis in the distal aorta as well as a tight stenosis at the common iliac artery on the right probably measuring 90%.  We performed bilateral lower extremity runoff with the above findings.  We then performed pullback aortic gradient which measured 5 mmHg systolic.  We then used ultrasound to identify the right common iliac artery.  This was cannulated with micropuncture needle followed the wire sheath.  And images saved the permanent record.  Patient was given 7000 units of heparin.  Glidewire advantage was used to cross the stenosis.  A long 7 French sheath was placed past the stenosis angiogram performed.  7 x 39 mm stent was then placed with fluoroscopic guidance to nominal pressure.  Completion demonstrated no residual stenosis.  Wires were removed.  Sheath will be pulled in post op holding.  She tolerated procedure well that immediate complication   Contrast: 140cc  Zelphia Glover C. Donzetta Matters, MD [facility] [place] Office: [phone] Pager: [phone]

## 2019-06-04 NOTE — Progress Notes (Signed)
Client states Dr Donzetta Matters in and okay to go home per Dr Theodis Blaze

## 2019-06-04 NOTE — Discharge Instructions (Signed)
° °  [facility] [place] ° °Discharge Instructions ° °Lower Extremity Angiogram; Angioplasty/Stenting ° °Please refer to the following instructions for your post-procedure care. Your surgeon or physician assistant will discuss any changes with you. ° °Activity ° °Avoid lifting more than 8 pounds (1 gallons of milk) for 72 hours (3 days) after your procedure. You may walk as much as you can tolerate. It's OK to drive after 72 hours. ° °Bathing/Showering ° °You may shower the day after your procedure. If you have a bandage, you may remove it at 24- 48 hours. Clean your incision site with mild soap and water. Pat the area dry with a clean towel. ° °Diet ° °Resume your pre-procedure diet. There are no special food restrictions following this procedure. All patients with peripheral vascular disease should follow a low fat/low cholesterol diet. In order to heal from your surgery, it is CRITICAL to get adequate nutrition. Your body requires vitamins, minerals, and protein. Vegetables are the best source of vitamins and minerals. Vegetables also provide the perfect balance of protein. Processed food has little nutritional value, so try to avoid this. ° °Medications ° °Resume taking all of your medications unless your doctor tells you not to. If your incision is causing pain, you may take over-the-counter pain relievers such as acetaminophen (Tylenol) ° °Follow Up ° °Follow up will be arranged at the time of your procedure. You may have an office visit scheduled or may be scheduled for surgery. Ask your surgeon if you have any questions. ° °Please call us immediately for any of the following conditions: °•Severe or worsening pain your legs or feet at rest or with walking. °•Increased pain, redness, drainage at your groin puncture site. °•Fever of 101 degrees or higher. °•If you have any mild or slow bleeding from your puncture site: lie down, apply firm constant pressure over the area with a piece of  gauze or a clean wash cloth for 30 minutes- no peeking!, call 911 right away if you are still bleeding after 30 minutes, or if the bleeding is heavy and unmanageable. ° °Reduce your risk factors of vascular disease: ° °Stop smoking. If you would like help call [facility] at 1-800-QUIT-NOW (1-800-784-8669) or  at 336-586-4000. °Manage your cholesterol °Maintain a desired weight °Control your diabetes °Keep your blood pressure down ° °If you have any questions, please call the office at [phone] ° °

## 2019-06-04 NOTE — Progress Notes (Signed)
Site area: rt groin fa sheath Site Prior to Removal:  Level 0 Pressure Applied For: 30 minutes Manual:   yes Patient Status During Pull:  stable Post Pull Site:  Level  0 Post Pull Instructions Given:  yes Post Pull Pulses Present: rt dp and pt dopplered Dressing Applied:  Gauze and tegaderm Bedrest begins @  Comments:   

## 2019-06-11 ENCOUNTER — Ambulatory Visit (HOSPITAL_COMMUNITY): Payer: Medicare (Managed Care) | Admitting: Psychiatry

## 2019-06-27 ENCOUNTER — Encounter: Payer: Self-pay | Admitting: Psychiatry

## 2019-06-27 ENCOUNTER — Other Ambulatory Visit: Payer: Self-pay

## 2019-06-27 ENCOUNTER — Ambulatory Visit (INDEPENDENT_AMBULATORY_CARE_PROVIDER_SITE_OTHER): Payer: Medicare (Managed Care) | Admitting: Psychiatry

## 2019-06-27 DIAGNOSIS — F325 Major depressive disorder, single episode, in full remission: Secondary | ICD-10-CM

## 2019-06-27 DIAGNOSIS — G4700 Insomnia, unspecified: Secondary | ICD-10-CM | POA: Diagnosis not present

## 2019-06-27 DIAGNOSIS — F909 Attention-deficit hyperactivity disorder, unspecified type: Secondary | ICD-10-CM

## 2019-06-27 MED ORDER — METHYLPHENIDATE HCL ER (OSM) 27 MG PO TBCR: 27.0000 mg | EXTENDED_RELEASE_TABLET | ORAL | 0 refills | Status: DC

## 2019-06-27 MED ORDER — ALPRAZOLAM 2 MG PO TABS: 2.0000 mg | ORAL_TABLET | Freq: Every evening | ORAL | 1 refills | Status: DC | PRN

## 2019-06-27 NOTE — Progress Notes (Signed)
BH MD OP Progress Note  I connected with  SKYLEEN BENTLEY on 06/27/19 by a video enabled telemedicine application and verified that I am speaking with the correct person using two identifiers.   I discussed the limitations of evaluation and management by telemedicine. The patient expressed understanding and agreed to proceed.   06/27/2019 1:46 PM Jessica [name]  MRN:  [id number]  Chief Complaint: " I am out of my Xanax and am not sleeping well."  HPI: Patient stated that overall she is doing fine but she has some days when she feels down as her mother is terminally ill.  She denied any overt symptoms of depression.   Patient reported that Concerta has been really helpful and she is doing well on that.  She is able to focus better and accomplish her tasks.  However she reported that about 2 weeks ago she ran out of Xanax.  She informed that she was prescribed Xanax by another provider mainly to help her with her indigestion and GI issues as she has history of bypass surgery.  However she is no longer seeing the provider and she is now out of that medication.  She informed that she was prescribed Xanax 1 mg 3 times a day and she used to take 2 tablets together at bedtime to help her sleep.  She stated that she has tried multiple different medications for insomnia but nothing has helped. As per EMR, she has been tried on trazodone, temazepam, Ambien, Lunesta, Belsomra, Rozerem for insomnia in the past.  Visit Diagnosis:    ICD-10-CM   1. Attention deficit hyperactivity disorder (ADHD), unspecified ADHD type  F90.9 methylphenidate (CONCERTA) 27 MG PO CR tablet    methylphenidate (CONCERTA) 27 MG PO CR tablet  2. Insomnia, unspecified type  G47.00 alprazolam (XANAX) 2 MG tablet  3. MDD (major depressive disorder), single episode, in full remission (HCC)  F32.5     Past Psychiatric History: ADHD, anxiety  Past Medical History:  Past Medical History:  Diagnosis Date  . ADHD (attention deficit  hyperactivity disorder)   . Anxiety   . Diabetes mellitus type I (HCC)   . Diverticulosis of colon without hemorrhage   . DM (diabetes mellitus) (HCC)    post pancreatectomy  . GERD (gastroesophageal reflux disease)   . Hashimoto's thyroiditis    euthyroid  . Hypercholesterolemia   . PONV (postoperative nausea and vomiting)     Past Surgical History:  Procedure Laterality Date  . ABDOMINAL AORTOGRAM W/LOWER EXTREMITY N/A 04/27/2017   Procedure: ABDOMINAL AORTOGRAM W/LOWER EXTREMITY;  Surgeon: Maeola Harman, MD;  Location: [facility] INVASIVE CV LAB;  Service: Cardiovascular;  Laterality: N/A;  . ABDOMINAL AORTOGRAM W/LOWER EXTREMITY Bilateral 06/04/2019   Procedure: ABDOMINAL AORTOGRAM W/LOWER EXTREMITY;  Surgeon: Maeola Harman, MD;  Location: [facility] INVASIVE CV LAB;  Service: Cardiovascular;  Laterality: Bilateral;  . ABDOMINAL HYSTERECTOMY    . BIOPSY N/A 05/15/2014   Procedure: GASTIC,  ASCENDING, AND DESCEDNING/SIGMOID  COLON BIOPSY;  Surgeon: Corbin Ade, MD;  Location: [facility];  Service: Endoscopy;  Laterality: N/A;  . CHOLECYSTECTOMY    . COLONOSCOPY WITH PROPOFOL N/A 05/15/2014   Dr. Jena Gauss (primary GI is Dr. Darrick Penna). Hemorrhoids, negative random colon biopsies. GI pathogen panel negative. Diverticulosis.  [name] complete hysterectomy    . ESOPHAGOGASTRODUODENOSCOPY (EGD) WITH PROPOFOL N/A 05/15/2014   Dr. Jena Gauss (primary GI Dr. Darrick Penna), Billroth II, inflamed residual gastric mucosa, benign biopsies  . FEMORAL-POPLITEAL BYPASS GRAFT    . 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  [name] KNEE SURGERY Right    X 6-5 arthroscopies and open procedure 1 time- tighten patellar tendon and loose IT band  . 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  [name] PERIPHERAL VASCULAR INTERVENTION Right 06/04/2019   Procedure: PERIPHERAL VASCULAR INTERVENTION;  Surgeon: Waynetta Sandy, MD;  Location: [place] CV LAB;  Service:  Cardiovascular;  Laterality: Right;  common iliac    Family Psychiatric History: Father-depression  Family History:  Family History  Problem Relation Age of Onset  . Pancreatic cancer Paternal Grandfather   . Cirrhosis Paternal Grandfather   . Pancreatitis Other        cousin  . Throat cancer Mother   . Depression Father   . Colon cancer Neg Hx     Social History:  Social History   Socioeconomic History  . Marital status: Married    Spouse name: Not on file  . Number of children: 0  . Years of education: Not on file  . Highest education level: Not on file  Occupational History  . Occupation: Occupational psychologist  Tobacco Use  . Smoking status: Current Every Day Smoker    Packs/day: 0.25    Years: 20.00    Pack years: 5.00    Types: Cigarettes  . Smokeless tobacco: Never Used  . Tobacco comment: 5 cigs a day  Substance and Sexual Activity  . Alcohol use: No    Alcohol/week: 0.0 standard drinks  . Drug use: No  . Sexual activity: Yes    Birth control/protection: None  Other Topics Concern  . Not on file  Social History Narrative  . Not on file   Social Determinants of Health   Financial Resource Strain:   . Difficulty of Paying Living Expenses: Not on file  Food Insecurity:   . Worried About Charity fundraiser in the Last Year: Not on file  . Ran Out of Food in the Last Year: Not on file  Transportation Needs:   . Lack of Transportation (Medical): Not on file  . Lack of Transportation (Non-Medical): Not on file  Physical Activity:   . Days of Exercise per Week: Not on file  . Minutes of Exercise per Session: Not on file  Stress:   . Feeling of Stress : Not on file  Social Connections:   . Frequency of Communication with Friends and Family: Not on file  . Frequency of Social Gatherings with Friends and Family: Not on file  . Attends Religious Services: Not on file  . Active Member of Clubs or Organizations: Not on file  . Attends Archivist  Meetings: Not on file  . Marital Status: Not on file    Allergies:  Allergies  Allergen Reactions  . Erythromycin Anaphylaxis, Hives and Nausea Only  . Levofloxacin Other (See Comments)    Burning during administration IV prior to surgery.Pt was told not to take it again.  . Niaspan [Niacin] Anaphylaxis  . Penicillins Anaphylaxis    Has patient had a PCN reaction causing immediate rash, facial/tongue/throat swelling, SOB or lightheadedness with hypotension: Yes Has patient had a PCN reaction causing severe rash involving mucus membranes or skin necrosis: No Has patient had a PCN reaction that required hospitalization: No Has patient had a PCN reaction occurring within the last 10 years: No If all of the above answers are "NO", then may proceed with Cephalosporin use.   . Shellfish Allergy Anaphylaxis  . Sulfa Antibiotics Anaphylaxis    "  Blisters in my throat"  . Wellbutrin [Bupropion] Other (See Comments)    Suicidal thoughts while on Lexapro  . Morphine Other (See Comments)    ineffective  . Strawberry Extract Hives  . Sulfamethoxazole Other (See Comments)    Blister in throat, sores in mouth  . Tape Other (See Comments)    Transpore Tape causes skin to peel;  and Nicoderm Adhesive Patch cause blisters   . Cefdinir     Unknown reaction   . Doxycycline Hives and Nausea Only    A 100mg  tablet formulation caused hives, nausea, vomiting.  Keflex [Cephalexin] Other (See Comments)    Hallucinations  . Statins Other (See Comments)    Muscle pain  . Valium [Diazepam] Other (See Comments)    Confusion and amnesia   . Vancomycin Hives and Nausea Only  . Zetia [Ezetimibe] Diarrhea  . 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 alprazolam (XANAX) 2 MG tablet Take 1 tablet (2 mg total) by mouth at bedtime as needed for sleep. 30 tablet 1  . aspirin EC 325 MG tablet Take 325 mg by mouth daily.    . clopidogrel (PLAVIX) 75 MG tablet Take 1 tablet (75 mg total) by mouth daily. 30 tablet 3  . EPIPEN 2-PAK 0.3 MG/0.3ML SOAJ injection Inject 0.3 mg into the skin daily as needed (allergic reaction).     . Evolocumab (REPATHA) 140 MG/ML SOSY Inject 140 mg into the skin every 14 (fourteen) days.    03/31/2014 levothyroxine (SYNTHROID) 50 MCG tablet Take 50 mcg by mouth daily before breakfast.    . lipase/protease/amylase (CREON) 36000 UNITS CPEP capsule Take 72,000-108,000 Units by mouth See admin instructions. Take 108000 units with each meal and [name] with each snack    . methylphenidate (CONCERTA) 27 MG PO CR tablet Take 1 tablet (27 mg total) by mouth daily before breakfast. 30 tablet 0  . methylphenidate (CONCERTA) 27 MG PO CR tablet Take 1 tablet (27 mg total) by mouth every morning. 30 tablet 0  . [START ON 07/27/2019] methylphenidate (CONCERTA) 27 MG PO CR tablet Take 1 tablet (27 mg total) by mouth every morning. 30 tablet 0  . Multiple Vitamin (MULTIVITAMIN WITH MINERALS) TABS tablet Take 2 tablets by mouth daily.    09/24/2019 NOVOLOG 100 UNIT/ML injection Inject 30 Units into the skin See admin instructions. Roughly 30 units via Insulin pump per 24 hours    . OVER THE COUNTER MEDICATION Take 2 tablets by mouth daily. [id number] otc supplement    . pantoprazole (PROTONIX) 40 MG tablet Take 80 mg by mouth at bedtime.     . ursodiol (ACTIGALL) 250 MG tablet Take 250 mg by mouth 3 (three) times daily.     No current facility-administered  medications for this visit.     Psychiatric Specialty Exam: Review of Systems  There were no vitals taken for this visit.There is no height or weight  on file to calculate BMI.  General Appearance: Well Groomed  Eye Contact:  Good  Speech:  Clear and Coherent and Normal Rate  Volume:  Normal  Mood:  Euthymic  Affect:  Congruent  Thought Process:  Goal Directed, Linear and Descriptions of Associations: Intact  Orientation:  Full (Time, Place, and Person)  Thought Content: Logical   Suicidal Thoughts:  No  Homicidal Thoughts:  No  Memory:  Recent;   Good Remote;   Good  Judgement:  Fair  Insight:  Fair  Psychomotor Activity:  Normal  Concentration:  Concentration: Good and Attention Span: Good  Recall:  Good  Fund of Knowledge: Good  Language: Good  Akathisia:  Negative  Handed:  Right  AIMS (if indicated): not done  Assets:  Communication Skills Desire for Improvement Financial Resources/Insurance Housing Social Support Talents/Skills  ADL's:  Intact  Cognition: WNL  Sleep:  Poor     Assessment and Plan: Patient reported that Concerta has been helpful but she is having a hard time with insomnia as she has been out of Xanax.  As per EMR she has been tried on different medications for insomnia. I have reviewed the PDMP during this encounter. Based on her history and response to Xanax will restart Xanax 2 mg at bedtime for insomnia.  1. Insomnia, unspecified type  - Restart alprazolam (XANAX) 2 MG tablet; Take 1 tablet (2 mg total) by mouth at bedtime as needed for sleep.  Dispense: 30 tablet; Refill: 1  2. Attention deficit hyperactivity disorder (ADHD), unspecified ADHD type  - Continue methylphenidate (CONCERTA) 27 MG PO CR tablet; Take 1 tablet (27 mg total) by mouth every morning.  Dispense: 30 tablet; Refill: 0 - methylphenidate (CONCERTA) 27 MG PO CR tablet; Take 1 tablet (27 mg total) by mouth every morning.  Dispense: 30 tablet; Refill: 0  3. MDD (major  depressive disorder), single episode, in full remission [facility])  She was seen Ms. Peggy for individual therapy in the past. Follow-up with Dr. Vanetta Shawl in 2 months.   Zena Amos, MD 06/27/2019, 1:46 PM

## 2019-07-12 ENCOUNTER — Telehealth (HOSPITAL_COMMUNITY): Payer: Self-pay

## 2019-07-12 ENCOUNTER — Other Ambulatory Visit: Payer: Self-pay

## 2019-07-12 DIAGNOSIS — I779 Disorder of arteries and arterioles, unspecified: Secondary | ICD-10-CM

## 2019-07-12 DIAGNOSIS — I739 Peripheral vascular disease, unspecified: Secondary | ICD-10-CM

## 2019-07-12 NOTE — Progress Notes (Signed)
[facility] [place] HISTORY AND PHYSICAL   History of Present Illness:  Patient is a 44 y.o. year old female who presents for evaluation  of symptoms of claudication.  She  has undergone stenting of her left SFA followed by femoropopliteal bypass.  She previously had preserved ABIs throughout.  She now has right lower extremity rest pain that is acute in nature.  She is indicated for angiogram possible intervention.  She has no palpable common femoral pulse her left side is still palpable and she has a palpable posterior tibial pulse on the left.  She underwent Aortogram with bilateral lower extremity runoff and Stent of right common iliac artery with 6 x 39 mm VBX 06/04/19.  She presents today for non invasive studies and f/u.    She Jessica [name] rest pain and history of non healing ulcers.  She has cut down on her smoking now to 4 per day and continues to work on it.  She states she still has trouble walking for extended times, but continues to work on it daily.      Past Medical History:  Diagnosis Date  . ADHD (attention deficit hyperactivity disorder)   . Anxiety   . Diabetes mellitus type I (HCC)   . Diverticulosis of colon without hemorrhage   . DM (diabetes mellitus) (HCC)    post pancreatectomy  . GERD (gastroesophageal reflux disease)   . Hashimoto's thyroiditis    euthyroid  . Hypercholesterolemia   . PONV (postoperative nausea and vomiting)     Past Surgical History:  Procedure Laterality Date  . ABDOMINAL AORTOGRAM W/LOWER EXTREMITY N/A 04/27/2017   Procedure: ABDOMINAL AORTOGRAM W/LOWER EXTREMITY;  Surgeon: Maeola Harman, MD;  Location: [facility] INVASIVE CV LAB;  Service: Cardiovascular;  Laterality: N/A;  . ABDOMINAL AORTOGRAM W/LOWER EXTREMITY Bilateral 06/04/2019   Procedure: ABDOMINAL AORTOGRAM W/LOWER EXTREMITY;  Surgeon: Maeola Harman, MD;  Location: [facility] INVASIVE CV LAB;  Service: Cardiovascular;  Laterality: Bilateral;  . ABDOMINAL  HYSTERECTOMY    . BIOPSY N/A 05/15/2014   Procedure: GASTIC,  ASCENDING, AND DESCEDNING/SIGMOID  COLON BIOPSY;  Surgeon: Corbin Ade, MD;  Location: [facility];  Service: Endoscopy;  Laterality: N/A;  . CHOLECYSTECTOMY    . COLONOSCOPY WITH PROPOFOL N/A 05/15/2014   Dr. Jena Gauss (primary GI is Dr. Darrick Penna). Hemorrhoids, negative random colon biopsies. GI pathogen panel negative. Diverticulosis.  [name] complete hysterectomy    . ESOPHAGOGASTRODUODENOSCOPY (EGD) WITH PROPOFOL N/A 05/15/2014   Dr. Jena Gauss (primary GI Dr. Darrick Penna), Billroth II, inflamed residual gastric mucosa, benign biopsies  . FEMORAL-POPLITEAL BYPASS GRAFT    . 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  [name] KNEE SURGERY Right    X 6-5 arthroscopies and open procedure 1 time- tighten patellar tendon and loose IT band  . 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  [name] PERIPHERAL VASCULAR INTERVENTION Right 06/04/2019   Procedure: PERIPHERAL VASCULAR INTERVENTION;  Surgeon: Maeola Harman, MD;  Location: [facility] INVASIVE CV LAB;  Service: Cardiovascular;  Laterality: Right;  common iliac    ROS:   General:  No weight loss, Fever, chills  HEENT: No recent headaches, no nasal bleeding, no visual changes, no sore throat  Neurologic: No dizziness, blackouts, seizures. No recent symptoms of stroke or mini- stroke. No recent episodes of slurred speech, or temporary blindness.  Cardiac: No recent episodes of chest pain/pressure, no shortness of breath at rest.  No shortness of breath with exertion.  Denies history of atrial fibrillation or  irregular heartbeat  Vascular: No history of rest pain in feet.  No history of claudication.  No history of non-healing ulcer, No history of DVT   Pulmonary: No home oxygen, no productive cough, no hemoptysis,  No asthma or wheezing  Musculoskeletal:  [ ]  Arthritis, [ ]  Low back pain,  [ ]  Joint pain  Hematologic:No history of hypercoagulable state.  No  history of easy bleeding.  No history of anemia  Gastrointestinal: No hematochezia or melena,  No gastroesophageal reflux, no trouble swallowing  Urinary: [ ]  chronic Kidney disease, [ ]  on HD - [ ]  MWF or [ ]  TTHS, [ ]  Burning with urination, [ ]  Frequent urination, [ ]  Difficulty urinating;   Skin: No rashes  Psychological: No history of anxiety,  No history of depression  Social History Social History   Tobacco Use  . Smoking status: Current Every Day Smoker    Packs/day: 0.25    Years: 20.00    Pack years: 5.00    Types: Cigarettes  . Smokeless tobacco: Never Used  . Tobacco comment: 5 cigs a day  Substance Use Topics  . Alcohol use: No    Alcohol/week: 0.0 standard drinks  . Drug use: No    Family History Family History  Problem Relation Age of Onset  . Pancreatic cancer Paternal Grandfather   . Cirrhosis Paternal Grandfather   . Pancreatitis Other        cousin  . Throat cancer Mother   . Depression Father   . Colon cancer Neg Hx     Allergies  Allergies  Allergen Reactions  . Erythromycin Anaphylaxis, Hives and Nausea Only  . Levofloxacin Other (See Comments)    Burning during administration IV prior to surgery.Pt was told not to take it again.  . Niaspan [Niacin] Anaphylaxis  . Penicillins Anaphylaxis    Has patient had a PCN reaction causing immediate rash, facial/tongue/throat swelling, SOB or lightheadedness with hypotension: Yes Has patient had a PCN reaction causing severe rash involving mucus membranes or skin necrosis: No Has patient had a PCN reaction that required hospitalization: No Has patient had a PCN reaction occurring within the last 10 years: No If all of the above answers are "NO", then may proceed with Cephalosporin use.   . Shellfish Allergy Anaphylaxis  . Sulfa Antibiotics Anaphylaxis    "Blisters in my throat"  . Wellbutrin [Bupropion] Other (See Comments)    Suicidal thoughts while on Lexapro  . Morphine Other (See Comments)     ineffective  . Strawberry Extract Hives  . Sulfamethoxazole Other (See Comments)    Blister in throat, sores in mouth  . Tape Other (See Comments)    Transpore Tape causes skin to peel;  and Nicoderm Adhesive Patch cause blisters   . Cefdinir     Unknown reaction   . Doxycycline Hives and Nausea Only    A 100mg  tablet formulation caused hives, nausea, vomiting.  Keflex [Cephalexin] Other (See Comments)    Hallucinations  . Statins Other (See Comments)    Muscle pain  . Valium [Diazepam] Other (See Comments)    Confusion and amnesia   . Vancomycin Hives and Nausea Only  . Zetia [Ezetimibe] Diarrhea  . 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 alprazolam (XANAX) 2 MG tablet Take 1 tablet (2 mg total) by  mouth at bedtime as needed for sleep. 30 tablet 1  . aspirin EC 325 MG tablet Take 325 mg by mouth daily.    . clopidogrel (PLAVIX) 75 MG tablet Take 1 tablet (75 mg total) by mouth daily. 30 tablet 3  . EPIPEN 2-PAK 0.3 MG/0.3ML SOAJ injection Inject 0.3 mg into the skin daily as needed (allergic reaction).     . Evolocumab (REPATHA) 140 MG/ML SOSY Inject 140 mg into the skin every 14 (fourteen) days.    [name] levothyroxine (SYNTHROID) 50 MCG tablet Take 50 mcg by mouth daily before breakfast.    . lipase/protease/amylase (CREON) 36000 UNITS CPEP capsule Take 72,000-108,000 Units by mouth See admin instructions. Take 108000 units with each meal and 72000 with each snack    . methylphenidate (CONCERTA) 27 MG PO CR tablet Take 1 tablet (27 mg total) by mouth every morning. 30 tablet 0  . [START ON 07/27/2019] methylphenidate (CONCERTA) 27 MG PO CR tablet Take 1 tablet (27 mg total) by mouth every morning. 30 tablet 0  . Multiple Vitamin (MULTIVITAMIN WITH MINERALS) TABS tablet Take 2 tablets by mouth daily.    [name] NOVOLOG 100 UNIT/ML  injection Inject 30 Units into the skin See admin instructions. Roughly 30 units via Insulin pump per 24 hours    . OVER THE COUNTER MEDICATION Take 2 tablets by mouth daily. [id number] otc supplement    . pantoprazole (PROTONIX) 40 MG tablet Take 80 mg by mouth at bedtime.     . ursodiol (ACTIGALL) 250 MG tablet Take 250 mg by mouth 3 (three) times daily.    . methylphenidate (CONCERTA) 27 MG PO CR tablet Take 1 tablet (27 mg total) by mouth daily before breakfast. 30 tablet 0   No current facility-administered medications for this visit.    Physical Examination  Vitals:   07/13/19 1017  BP: 124/80  Pulse: 81  Resp: 14  Temp: (!) 97.3 F (36.3 C)  TempSrc: Temporal  SpO2: 99%  Weight: 141 lb (64 kg)  Height: 5\' 6"  (1.676 m)    Body mass index is 22.76 kg/m.  General:  Alert and oriented, no acute distress HEENT: Normal Neck: No bruit or JVD Pulmonary: Clear to auscultation bilaterally Cardiac: Regular Rate and Rhythm without murmur Abdomen: Soft, non-tender, non-distended, no mass, no scars Skin: No rash Extremity Pulses:  2+ radial, brachial, femoral,  pulses bilaterally and doppler signals PT/DP B  Musculoskeletal: No deformity or edema  Neurologic: Upper and lower extremity motor 5/5 and symmetric  DATA:    ABI Findings: +---------+------------------+-----+---------+--------+ Right    Rt Pressure (mmHg)IndexWaveform Comment  +---------+------------------+-----+---------+--------+ Brachial 117                                      +---------+------------------+-----+---------+--------+ PTA      121               1.02 biphasic          +---------+------------------+-----+---------+--------+ DP       117               0.98 triphasic         +---------+------------------+-----+---------+--------+ Great Toe106               0.89 Normal             +---------+------------------+-----+---------+--------+  +---------+------------------+-----+---------+-------+ Left     Lt Pressure (mmHg)IndexWaveform Comment +---------+------------------+-----+---------+-------+ Brachial 119                                     +---------+------------------+-----+---------+-------+  PTA      140               1.18 triphasic        +---------+------------------+-----+---------+-------+ DP       129               1.08 triphasic        +---------+------------------+-----+---------+-------+ Great Toe111               0.93 Normal           +---------+------------------+-----+---------+-------+  +-------+-----------+-----------+------------+------------+ ABI/TBIToday's ABIToday's TBIPrevious ABIPrevious TBI +-------+-----------+-----------+------------+------------+ Right  1.02       0.89       0.81        0.97         +-------+-----------+-----------+------------+------------+ Left   1.18       0.93       1.02        1.07         +-------+-----------+-----------+------------+------------+      Summary: Right: Resting right ankle-brachial index is within normal range. No evidence of significant right lower extremity arterial disease. The right toe-brachial index is normal.  Left: Resting left ankle-brachial index is within normal range. No evidence of significant left lower extremity arterial disease. The left toe-brachial index is normal.     Abdominal Aorta Findings: +------------+-------+----------+----------+--------+--------+--------+ Location    AP (cm)Trans (cm)PSV (cm/s)WaveformThrombusComments +------------+-------+----------+----------+--------+--------+--------+ Distal aorta                 73        biphasic                 +------------+-------+----------+----------+--------+--------+--------+      Right Stent(s): +---------------+---++---------++ Prox  to Stent  92 triphasic +---------------+---++---------++ Proximal Stent 139triphasic +---------------+---++---------++ Mid Stent      123triphasic +---------------+---++---------++ Distal Stent   199triphasic +---------------+---++---------++ Distal to Stent222biphasic  +---------------+---++---------++  Right CFA 99 cm/s, Left CFA 128 cm/s, triphasic waveforms.           Summary: Abdominal Aorta: Patent right common iliac artery stent with no visualized stenosis.    ASSESSMENT:   S/Pstenting of her left SFA followed by femoropopliteal bypass 04/27/2017 followed by  Stent of right common iliac artery with 6 x 39 mm VBX 06/04/19.   Patent arterial blood flow B LE today on duplex and ABI's   PLAN:  She will f/u for repeat ABI and arterial duplex in 9 months.  Continue Plavix and Aspirin daily.  If she develops symptoms of claudication or ischemia she will call us sooner.   Mosetta Pigeon PA-C [facility] [place] Office: [phone]  MD in clinic [place]

## 2019-07-12 NOTE — Telephone Encounter (Signed)

## 2019-07-13 ENCOUNTER — Ambulatory Visit (INDEPENDENT_AMBULATORY_CARE_PROVIDER_SITE_OTHER)
Admit: 2019-07-13 | Discharge: 2019-07-13 | Disposition: A | Discharge status: Home / Self Care | Payer: Medicare (Managed Care) | Attending: Vascular Surgery | Admitting: Vascular Surgery

## 2019-07-13 ENCOUNTER — Ambulatory Visit (HOSPITAL_COMMUNITY)
Admission: RE | Admit: 2019-07-13 | Discharge: 2019-07-13 | Disposition: A | Discharge status: Home / Self Care | Payer: Medicare (Managed Care) | Source: Ambulatory Visit | Attending: Vascular Surgery | Admitting: Vascular Surgery

## 2019-07-13 ENCOUNTER — Ambulatory Visit (INDEPENDENT_AMBULATORY_CARE_PROVIDER_SITE_OTHER): Payer: Medicare (Managed Care) | Admitting: Physician Assistant

## 2019-07-13 ENCOUNTER — Encounter: Payer: Self-pay | Admitting: Physician Assistant

## 2019-07-13 ENCOUNTER — Other Ambulatory Visit: Payer: Self-pay

## 2019-07-13 VITALS — BP 124/80 | HR 81 | Temp 97.3°F | Resp 14 | Ht 66.0 in | Wt 141.0 lb

## 2019-07-13 DIAGNOSIS — I779 Disorder of arteries and arterioles, unspecified: Secondary | ICD-10-CM

## 2019-07-13 DIAGNOSIS — I739 Peripheral vascular disease, unspecified: Secondary | ICD-10-CM | POA: Diagnosis present

## 2019-07-16 ENCOUNTER — Other Ambulatory Visit: Payer: Self-pay | Admitting: *Deleted

## 2019-07-16 DIAGNOSIS — I739 Peripheral vascular disease, unspecified: Secondary | ICD-10-CM

## 2019-09-05 ENCOUNTER — Other Ambulatory Visit (HOSPITAL_COMMUNITY): Payer: Self-pay | Admitting: Psychiatry

## 2019-09-05 ENCOUNTER — Telehealth (HOSPITAL_COMMUNITY): Payer: Self-pay | Admitting: *Deleted

## 2019-09-05 DIAGNOSIS — G4700 Insomnia, unspecified: Secondary | ICD-10-CM

## 2019-09-05 DIAGNOSIS — F909 Attention-deficit hyperactivity disorder, unspecified type: Secondary | ICD-10-CM

## 2019-09-05 MED ORDER — METHYLPHENIDATE HCL ER (OSM) 27 MG PO TBCR: 27.0000 mg | EXTENDED_RELEASE_TABLET | ORAL | 0 refills | Status: DC

## 2019-09-05 MED ORDER — ALPRAZOLAM 2 MG PO TABS: 2.0000 mg | ORAL_TABLET | Freq: Every evening | ORAL | 0 refills | Status: DC | PRN

## 2019-09-05 NOTE — Telephone Encounter (Signed)
PATIENT CALLED BACK TO STATE SHE ALSO NEED REFILL ON  alprazolam (XANAX) 2 MG tablet

## 2019-09-05 NOTE — Telephone Encounter (Signed)
PATIENT CALLED REQUESTED REFILL methylphenidate (CONCERTA) 27 MG PO CR tablet 30 DAY SUPPLY UNTIL NEXT APPT ON  4/86/21/ @ 10AM

## 2019-09-05 NOTE — Telephone Encounter (Signed)
ordered
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
## 2019-09-25 ENCOUNTER — Ambulatory Visit (INDEPENDENT_AMBULATORY_CARE_PROVIDER_SITE_OTHER): Payer: 59 | Admitting: Psychiatry

## 2019-09-25 ENCOUNTER — Other Ambulatory Visit: Payer: Self-pay

## 2019-09-25 ENCOUNTER — Encounter (HOSPITAL_COMMUNITY): Payer: Self-pay | Admitting: Psychiatry

## 2019-09-25 DIAGNOSIS — G4700 Insomnia, unspecified: Secondary | ICD-10-CM | POA: Diagnosis not present

## 2019-09-25 DIAGNOSIS — F909 Attention-deficit hyperactivity disorder, unspecified type: Secondary | ICD-10-CM

## 2019-09-25 MED ORDER — METHYLPHENIDATE HCL ER (OSM) 27 MG PO TBCR: 27.0000 mg | EXTENDED_RELEASE_TABLET | ORAL | 0 refills | Status: DC

## 2019-09-25 MED ORDER — ALPRAZOLAM 1 MG PO TABS: 1.0000 mg | ORAL_TABLET | Freq: Every evening | ORAL | 1 refills | Status: DC | PRN

## 2019-09-25 MED ORDER — METHYLPHENIDATE HCL ER (OSM) 27 MG PO TBCR: 27.0000 mg | EXTENDED_RELEASE_TABLET | Freq: Every day | ORAL | 0 refills | Status: DC

## 2019-09-25 NOTE — Patient Instructions (Signed)
1.ContinueConcerta 27mg  daily  2. Decrease Xanax 1 mg as needed for anxiety 3.Next appointment: 6/30 at 10 AM

## 2019-10-16 DIAGNOSIS — Z9889 Other specified postprocedural states: Secondary | ICD-10-CM | POA: Insufficient documentation

## 2019-10-25 ENCOUNTER — Other Ambulatory Visit: Payer: Self-pay

## 2019-10-25 DIAGNOSIS — I739 Peripheral vascular disease, unspecified: Secondary | ICD-10-CM

## 2019-10-25 MED ORDER — CLOPIDOGREL BISULFATE 75 MG PO TABS: 75.0000 mg | ORAL_TABLET | Freq: Every day | ORAL | 3 refills | Status: DC

## 2019-11-06 ENCOUNTER — Other Ambulatory Visit: Payer: Self-pay

## 2019-11-06 ENCOUNTER — Encounter: Payer: Self-pay | Admitting: Physician Assistant

## 2019-11-06 ENCOUNTER — Ambulatory Visit (HOSPITAL_COMMUNITY)
Admission: RE | Admit: 2019-11-06 | Discharge: 2019-11-06 | Disposition: A | Discharge status: Home / Self Care | Payer: Medicare (Managed Care) | Source: Ambulatory Visit | Attending: Vascular Surgery | Admitting: Vascular Surgery

## 2019-11-06 ENCOUNTER — Ambulatory Visit (INDEPENDENT_AMBULATORY_CARE_PROVIDER_SITE_OTHER)
Admission: RE | Admit: 2019-11-06 | Discharge: 2019-11-06 | Disposition: A | Discharge status: Home / Self Care | Payer: Medicare (Managed Care) | Source: Ambulatory Visit | Attending: Vascular Surgery | Admitting: Vascular Surgery

## 2019-11-06 ENCOUNTER — Ambulatory Visit (INDEPENDENT_AMBULATORY_CARE_PROVIDER_SITE_OTHER): Payer: Medicare (Managed Care) | Admitting: Physician Assistant

## 2019-11-06 VITALS — BP 124/76 | HR 95 | Temp 98.0°F | Resp 20 | Ht 66.0 in | Wt 141.0 lb

## 2019-11-06 DIAGNOSIS — I739 Peripheral vascular disease, unspecified: Secondary | ICD-10-CM | POA: Diagnosis not present

## 2019-11-06 DIAGNOSIS — I70229 Atherosclerosis of native arteries of extremities with rest pain, unspecified extremity: Secondary | ICD-10-CM | POA: Diagnosis not present

## 2019-11-06 DIAGNOSIS — I779 Disorder of arteries and arterioles, unspecified: Secondary | ICD-10-CM

## 2019-11-06 NOTE — Progress Notes (Signed)
Office Note     CC:  follow up Requesting Provider:  Ollen Bowl, MD  HPI: Jessica [name] is a 44 y.o. (1976-01-12) female who presents for follow up of peripheral vascular disease.  She has undergone stenting of her left SFA followed by femoropopliteal bypass.  She has also undergone stenting of her right common iliac artery with 6 x 39 mm VBX 06/04/19.    She presents today for non invasive studies and f/u. She says approximately 2 weeks ago she had sudden onset of excruciating pain run down her right leg from right groin to foot. Since then she has been barely able to ambulate without pain.  She says she has not changed her activity and denies any changes in her medications. Pain is keeping her awake at night and also worsened by ambulation. She says she knew stent was occluded because it feels the way it felt prior to the stent again. She is having some intermittent tingling in the left leg but otherwise no claudication or left leg or foot pain.   She continues to smoke about 4 cigarettes a day  Of note she had a J Tube placed in area of her previous PEG tube in April 2021  The pt is not on a statin for cholesterol management.  The pt is on a daily aspirin.   Other AC: Plavix The pt is not on medication for hypertension.   The pt is diabetic.  On insulin Tobacco hx:  current  Past Medical History:  Diagnosis Date  . ADHD (attention deficit hyperactivity disorder)   . Anxiety   . Diabetes mellitus type I ([place])   . Diverticulosis of colon without hemorrhage   . DM (diabetes mellitus) ([place])    post pancreatectomy  . Feeding by G-tube ([place])   . GERD (gastroesophageal reflux disease)   . Hashimoto's thyroiditis    euthyroid  . Hypercholesterolemia   . PONV (postoperative nausea and vomiting)     Past Surgical History:  Procedure Laterality Date  . ABDOMINAL AORTOGRAM W/LOWER EXTREMITY N/A 04/27/2017   Procedure: ABDOMINAL AORTOGRAM W/LOWER EXTREMITY;  Surgeon: Waynetta Sandy, MD;  Location: [facility];  Service: Cardiovascular;  Laterality: N/A;  . ABDOMINAL AORTOGRAM W/LOWER EXTREMITY Bilateral 06/04/2019   Procedure: ABDOMINAL AORTOGRAM W/LOWER EXTREMITY;  Surgeon: Waynetta Sandy, MD;  Location: [facility];  Service: Cardiovascular;  Laterality: Bilateral;  . ABDOMINAL HYSTERECTOMY    . BIOPSY N/A 05/15/2014   Procedure: GASTIC,  ASCENDING, AND DESCEDNING/SIGMOID  COLON BIOPSY;  Surgeon: Daneil Dolin, MD;  Location: [facility];  Service: Endoscopy;  Laterality: N/A;  . CHOLECYSTECTOMY    . COLONOSCOPY WITH PROPOFOL N/A 05/15/2014   Dr. Gala Romney (primary GI is Dr. Oneida Alar). Hemorrhoids, negative random colon biopsies. GI pathogen panel negative. Diverticulosis.  [name] complete hysterectomy    . ESOPHAGOGASTRODUODENOSCOPY (EGD) WITH PROPOFOL N/A 05/15/2014   Dr. Gala Romney (primary GI Dr. Oneida Alar), Billroth II, inflamed residual gastric mucosa, benign biopsies  . FEMORAL-POPLITEAL BYPASS GRAFT    . 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  [name] KNEE SURGERY Right    X 6-5 arthroscopies and open procedure 1 time- tighten patellar tendon and loose IT band  . 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  [name] PERIPHERAL VASCULAR INTERVENTION Right 06/04/2019   Procedure: PERIPHERAL VASCULAR INTERVENTION;  Surgeon: Waynetta Sandy, MD;  Location: [facility];  Service: Cardiovascular;  Laterality: Right;  common iliac  Social History   Socioeconomic History  . Marital status: Married    Spouse name: Not on file  . Number of children: 0  . Years of education: Not on file  . Highest education level: Not on file  Occupational History  . Occupation: Associate Professor  Tobacco Use  . Smoking status: Current Every Day Smoker    Packs/day: 0.25    Years: 20.00    Pack years: 5.00    Types: Cigarettes  . Smokeless tobacco: Never Used  . Tobacco comment: 4 cigs daily  Substance and  Sexual Activity  . Alcohol use: No    Alcohol/week: 0.0 standard drinks  . Drug use: No  . Sexual activity: Yes    Birth control/protection: None  Other Topics Concern  . Not on file  Social History Narrative  . Not on file   Social Determinants of Health   Financial Resource Strain:   . Difficulty of Paying Living Expenses:   Food Insecurity:   . Worried About Programme researcher, broadcasting/film/video in the Last Year:   . Barista in the Last Year:   Transportation Needs:   . Freight forwarder (Medical):   [name] Lack of Transportation (Non-Medical):   Physical Activity:   . Days of Exercise per Week:   . Minutes of Exercise per Session:   Stress:   . Feeling of Stress :   Social Connections:   . Frequency of Communication with Friends and Family:   . Frequency of Social Gatherings with Friends and Family:   . Attends Religious Services:   . Active Member of Clubs or Organizations:   . Attends Banker Meetings:   [name] Marital Status:   Intimate Partner Violence:   . Fear of Current or Ex-Partner:   . Emotionally Abused:   [name] Physically Abused:   . Sexually Abused:     Family History  Problem Relation Age of Onset  . Pancreatic cancer Paternal Grandfather   . Cirrhosis Paternal Grandfather   . Pancreatitis Other        cousin  . Throat cancer Mother   . Depression Father   . Colon cancer Neg Hx     Current Outpatient Medications  Medication Sig Dispense Refill  . ALPRAZolam (XANAX) 1 MG tablet Take 1 tablet (1 mg total) by mouth at bedtime as needed for sleep. 30 tablet 1  . clopidogrel (PLAVIX) 75 MG tablet Take 1 tablet (75 mg total) by mouth daily. 30 tablet 3  . EPIPEN 2-PAK 0.3 MG/0.3ML SOAJ injection Inject 0.3 mg into the skin daily as needed (allergic reaction).     . Evolocumab (REPATHA) 140 MG/ML SOSY Inject 140 mg into the skin every 14 (fourteen) days.    . insulin lispro (HUMALOG) 100 UNIT/ML injection SMARTSIG:0-100 Unit(s) SUB-Q Daily    .  levothyroxine (SYNTHROID) 75 MCG tablet Take by mouth.    Melene Muller ON 12/04/2019] methylphenidate (CONCERTA) 27 MG PO CR tablet Take 1 tablet (27 mg total) by mouth every morning. 30 tablet 0  . methylphenidate (CONCERTA) 27 MG PO CR tablet Take 1 tablet (27 mg total) by mouth every morning. 30 tablet 0  . Multiple Vitamin (MULTIVITAMIN WITH MINERALS) TABS tablet Take 2 tablets by mouth daily.    [name] OVER THE COUNTER MEDICATION Take 2 tablets by mouth daily. D-[id number] otc supplement    . pantoprazole (PROTONIX) 40 MG tablet Take 80 mg by mouth at bedtime.     . Vitamin  D, Ergocalciferol, (DRISDOL) 1.25 MG (50000 UNIT) CAPS capsule Take 50,000 Units by mouth once a week.    . methylphenidate (CONCERTA) 27 MG PO CR tablet Take 1 tablet (27 mg total) by mouth daily before breakfast. 30 tablet 0   No current facility-administered medications for this visit.    Allergies  Allergen Reactions  . Erythromycin Anaphylaxis, Hives and Nausea Only  . Levofloxacin Other (See Comments)    Burning during administration IV prior to surgery.Pt was told not to take it again.  . Niaspan [Niacin] Anaphylaxis  . Penicillins Anaphylaxis    Has patient had a PCN reaction causing immediate rash, facial/tongue/throat swelling, SOB or lightheadedness with hypotension: Yes Has patient had a PCN reaction causing severe rash involving mucus membranes or skin necrosis: No Has patient had a PCN reaction that required hospitalization: No Has patient had a PCN reaction occurring within the last 10 years: No If all of the above answers are "NO", then may proceed with Cephalosporin use.   . Shellfish Allergy Anaphylaxis  . Sulfa Antibiotics Anaphylaxis    "Blisters in my throat"  . Wellbutrin [Bupropion] Other (See Comments)    Suicidal thoughts while on Lexapro  . Morphine Other (See Comments)    ineffective  . Strawberry Extract Hives  . Sulfamethoxazole Other (See Comments)    Blister in throat, sores in mouth  .  Tape Other (See Comments)    Transpore Tape causes skin to peel;  and Nicoderm Adhesive Patch cause blisters   . Cefdinir     Unknown reaction   . Doxycycline Hives and Nausea Only    A 100mg  tablet formulation caused hives, nausea, vomiting.  Keflex [Cephalexin] Other (See Comments)    Hallucinations  . Statins Other (See Comments)    Muscle pain  . Valium [Diazepam] Other (See Comments)    Confusion and amnesia   . Vancomycin Hives and Nausea Only  . Zetia [Ezetimibe] Diarrhea  . 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  Eyes: Negative for double vision.  Respiratory: Negative for cough and shortness of breath.   Cardiovascular: Negative for chest pain, palpitations and leg swelling.  Gastrointestinal: Negative for abdominal pain, constipation, diarrhea, nausea and vomiting.  Musculoskeletal: Negative for myalgias.  Neurological: Negative for dizziness, focal weakness and headaches.    PHYSICAL EXAMINATION:  Vitals:   11/06/19 0908  BP: 124/76  Pulse: 95  Resp: 20  Temp: 98 F (36.7 C)  SpO2: 97%  Weight: 141 lb (64 kg)  Height: 5\' 6"  (1.676 m)    General:  WDWN in NAD; vital signs documented above Gait: normal but favoring right leg HENT: WNL, normocephalic Pulmonary: normal non-labored breathing , without Rales, rhonchi,  wheezing Cardiac: regular HR, without  Murmurs without carotid bruit Abdomen: soft, NT, no masses Skin: without rashes Vascular Exam/Pulses:  Right Left  Radial 2+ (normal) 2+ (normal)  Ulnar 2+ (normal) 2+ (normal)  Femoral 1 + palpable 2+ palpable  Popliteal Not palpable Not palpable  DP Not palpable 2+ (normal)  PT Not palpable Not palpable   Extremities: without ischemic changes, without Gangrene , without cellulitis; without open wounds;  bilateral feet cool R greater than left. Cyanosis of distal right great toe Musculoskeletal: no muscle wasting or atrophy  Neurologic: A&O X 3;  No focal  weakness or paresthesias are detected Psychiatric:  The pt has Normal affect.   Non-Invasive Vascular Imaging:   +-------+-----------+-----------+------------+------------+  ABI/TBIToday's ABIToday's TBIPrevious ABIPrevious TBI  +-------+-----------+-----------+------------+------------+  Right 0.50    0.18    1.02    0.89      +-------+-----------+-----------+------------+------------+  Left  0.98    0.94    1.18    0.93      +-------+-----------+-----------+------------+------------+     ASSESSMENT/PLAN:: 44 y.o. female here for follow up for peripheral vascular disease. She now has rest pain in right lower extremity. Non invasives confirm occlusion of right iliac stent. 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 She is on Aspirin and Plavix. She does not take statins due to adverse reaction. She continues to smoke. Recommend Angiogram with right lower extremity runoff. Patient states she has not tolerated the Angiograms in the past and requests to have this done under general anesthesia in OR. I will discuss this with Dr. Randie Heinz however I discussed with patient that this procedure is usually just done with local and IV sedation. Discussed risks/ benefits of the Angiogram with the patient and her husband and answered their questions. This will be scheduled at the next earliest availability with Dr. Randie Heinz.   Graceann Congress, PA-C [facility] [phone]  Clinic MD:   Dr. Chestine Spore

## 2019-11-07 ENCOUNTER — Encounter (INDEPENDENT_AMBULATORY_CARE_PROVIDER_SITE_OTHER): Payer: Self-pay

## 2019-11-20 DIAGNOSIS — M533 Sacrococcygeal disorders, not elsewhere classified: Secondary | ICD-10-CM | POA: Insufficient documentation

## 2019-11-23 ENCOUNTER — Other Ambulatory Visit (HOSPITAL_COMMUNITY)
Admission: RE | Admit: 2019-11-23 | Discharge: 2019-11-23 | Disposition: A | Discharge status: Home / Self Care | Payer: Medicare (Managed Care) | Source: Ambulatory Visit | Attending: Vascular Surgery | Admitting: Vascular Surgery

## 2019-11-23 DIAGNOSIS — Z01812 Encounter for preprocedural laboratory examination: Secondary | ICD-10-CM | POA: Diagnosis present

## 2019-11-23 DIAGNOSIS — Z20822 Contact with and (suspected) exposure to covid-19: Secondary | ICD-10-CM | POA: Insufficient documentation

## 2019-11-23 LAB — SARS CORONAVIRUS 2 (TAT 6-24 HRS): SARS Coronavirus 2: NEGATIVE

## 2019-11-26 ENCOUNTER — Ambulatory Visit (HOSPITAL_COMMUNITY)
Admission: RE | Admit: 2019-11-26 | Discharge: 2019-11-26 | Disposition: A | Discharge status: Home / Self Care | Payer: Medicare (Managed Care) | Attending: Vascular Surgery | Admitting: Vascular Surgery

## 2019-11-26 ENCOUNTER — Ambulatory Visit (HOSPITAL_COMMUNITY)
Admission: RE | Disposition: A | Discharge status: Home / Self Care | Payer: Self-pay | Source: Home / Self Care | Attending: Vascular Surgery

## 2019-11-26 ENCOUNTER — Other Ambulatory Visit: Payer: Self-pay

## 2019-11-26 DIAGNOSIS — F17210 Nicotine dependence, cigarettes, uncomplicated: Secondary | ICD-10-CM | POA: Diagnosis not present

## 2019-11-26 DIAGNOSIS — K219 Gastro-esophageal reflux disease without esophagitis: Secondary | ICD-10-CM | POA: Insufficient documentation

## 2019-11-26 DIAGNOSIS — F909 Attention-deficit hyperactivity disorder, unspecified type: Secondary | ICD-10-CM | POA: Diagnosis not present

## 2019-11-26 DIAGNOSIS — E7800 Pure hypercholesterolemia, unspecified: Secondary | ICD-10-CM | POA: Insufficient documentation

## 2019-11-26 DIAGNOSIS — E1151 Type 2 diabetes mellitus with diabetic peripheral angiopathy without gangrene: Secondary | ICD-10-CM | POA: Insufficient documentation

## 2019-11-26 DIAGNOSIS — F419 Anxiety disorder, unspecified: Secondary | ICD-10-CM | POA: Diagnosis not present

## 2019-11-26 DIAGNOSIS — Z7902 Long term (current) use of antithrombotics/antiplatelets: Secondary | ICD-10-CM | POA: Insufficient documentation

## 2019-11-26 DIAGNOSIS — Z931 Gastrostomy status: Secondary | ICD-10-CM | POA: Insufficient documentation

## 2019-11-26 DIAGNOSIS — I70221 Atherosclerosis of native arteries of extremities with rest pain, right leg: Secondary | ICD-10-CM | POA: Diagnosis present

## 2019-11-26 DIAGNOSIS — Z7982 Long term (current) use of aspirin: Secondary | ICD-10-CM | POA: Diagnosis not present

## 2019-11-26 HISTORY — PX: ABDOMINAL AORTOGRAM W/LOWER EXTREMITY: CATH118223

## 2019-11-26 HISTORY — PX: PERIPHERAL VASCULAR INTERVENTION: CATH118257

## 2019-11-26 LAB — GLUCOSE, CAPILLARY: Glucose-Capillary: 222 mg/dL — ABNORMAL HIGH (ref 70–99)

## 2019-11-26 LAB — POCT I-STAT, CHEM 8
BUN: 8 mg/dL (ref 6–20)
Calcium, Ion: 1.17 mmol/L (ref 1.15–1.40)
Chloride: 102 mmol/L (ref 98–111)
Creatinine, Ser: 0.6 mg/dL (ref 0.44–1.00)
Glucose, Bld: 257 mg/dL — ABNORMAL HIGH (ref 70–99)
HCT: 43 % (ref 36.0–46.0)
Hemoglobin: 14.6 g/dL (ref 12.0–15.0)
Potassium: 4.1 mmol/L (ref 3.5–5.1)
Sodium: 141 mmol/L (ref 135–145)
TCO2: 27 mmol/L (ref 22–32)

## 2019-11-26 LAB — POCT ACTIVATED CLOTTING TIME
Activated Clotting Time: 197 seconds
Activated Clotting Time: 169 seconds

## 2019-11-26 SURGERY — ABDOMINAL AORTOGRAM W/LOWER EXTREMITY
Anesthesia: LOCAL | Laterality: Right

## 2019-11-26 MED ORDER — FENTANYL CITRATE (PF) 100 MCG/2ML IJ SOLN
INTRAMUSCULAR | Status: AC
  Filled 2019-11-26: qty 2

## 2019-11-26 MED ORDER — OXYCODONE HCL 5 MG PO TABS
5.0000 mg | ORAL_TABLET | ORAL | Status: DC | PRN
  Administered 2019-11-26: 5 mg via ORAL
  Filled 2019-11-26: qty 1

## 2019-11-26 MED ORDER — HEPARIN (PORCINE) IN NACL 1000-0.9 UT/500ML-% IV SOLN
INTRAVENOUS | Status: DC | PRN
  Administered 2019-11-26 (×2): 500 mL

## 2019-11-26 MED ORDER — LIDOCAINE HCL (PF) 1 % IJ SOLN
INTRAMUSCULAR | Status: AC
  Filled 2019-11-26: qty 30

## 2019-11-26 MED ORDER — SODIUM CHLORIDE 0.9 % WEIGHT BASED INFUSION: 1.0000 mL/kg/h | INTRAVENOUS | Status: DC

## 2019-11-26 MED ORDER — ACETAMINOPHEN 325 MG PO TABS: 650.0000 mg | ORAL_TABLET | ORAL | Status: DC | PRN

## 2019-11-26 MED ORDER — SODIUM CHLORIDE 0.9% FLUSH: 3.0000 mL | Freq: Two times a day (BID) | INTRAVENOUS | Status: DC

## 2019-11-26 MED ORDER — MIDAZOLAM HCL 2 MG/2ML IJ SOLN
INTRAMUSCULAR | Status: AC
  Filled 2019-11-26: qty 2

## 2019-11-26 MED ORDER — FENTANYL CITRATE (PF) 100 MCG/2ML IJ SOLN
INTRAMUSCULAR | Status: DC | PRN
  Administered 2019-11-26 (×2): 50 ug via INTRAVENOUS

## 2019-11-26 MED ORDER — ONDANSETRON HCL 4 MG/2ML IJ SOLN: 4.0000 mg | Freq: Four times a day (QID) | INTRAMUSCULAR | Status: DC | PRN

## 2019-11-26 MED ORDER — SODIUM CHLORIDE 0.9% FLUSH: 3.0000 mL | INTRAVENOUS | Status: DC | PRN

## 2019-11-26 MED ORDER — SODIUM CHLORIDE 0.9 % IV SOLN: 250.0000 mL | INTRAVENOUS | Status: DC | PRN

## 2019-11-26 MED ORDER — HEPARIN (PORCINE) IN NACL 1000-0.9 UT/500ML-% IV SOLN
INTRAVENOUS | Status: AC
  Filled 2019-11-26: qty 1000

## 2019-11-26 MED ORDER — HYDROMORPHONE HCL 1 MG/ML IJ SOLN
INTRAMUSCULAR | Status: AC
  Filled 2019-11-26: qty 0.5

## 2019-11-26 MED ORDER — SODIUM CHLORIDE 0.9 % IV SOLN: INTRAVENOUS | Status: DC

## 2019-11-26 MED ORDER — LABETALOL HCL 5 MG/ML IV SOLN: 10.0000 mg | INTRAVENOUS | Status: DC | PRN

## 2019-11-26 MED ORDER — HYDRALAZINE HCL 20 MG/ML IJ SOLN: 5.0000 mg | INTRAMUSCULAR | Status: DC | PRN

## 2019-11-26 MED ORDER — HEPARIN SODIUM (PORCINE) 1000 UNIT/ML IJ SOLN
INTRAMUSCULAR | Status: DC | PRN
  Administered 2019-11-26: 6000 [IU] via INTRAVENOUS

## 2019-11-26 MED ORDER — HEPARIN SODIUM (PORCINE) 1000 UNIT/ML IJ SOLN
INTRAMUSCULAR | Status: AC
  Filled 2019-11-26: qty 1

## 2019-11-26 MED ORDER — HYDROMORPHONE HCL 1 MG/ML IJ SOLN
0.5000 mg | INTRAMUSCULAR | Status: DC | PRN
  Administered 2019-11-26: 0.5 mg via INTRAVENOUS

## 2019-11-26 MED ORDER — MIDAZOLAM HCL 2 MG/2ML IJ SOLN
INTRAMUSCULAR | Status: DC | PRN
  Administered 2019-11-26 (×4): 1 mg via INTRAVENOUS

## 2019-11-26 SURGICAL SUPPLY — 18 items
BALLN MUSTANG 6X60X75 (BALLOONS) ×3
BALLOON MUSTANG 6X60X75 (BALLOONS) IMPLANT
CATH BEACON 5 .035 65 KMP TIP (CATHETERS) ×1 IMPLANT
CATH OMNI FLUSH 5F 65CM (CATHETERS) ×1 IMPLANT
GLIDEWIRE ADV .035X180CM (WIRE) ×1 IMPLANT
KIT ENCORE 26 ADVANTAGE (KITS) ×1 IMPLANT
KIT MICROPUNCTURE NIT STIFF (SHEATH) ×1 IMPLANT
KIT PV (KITS) ×3 IMPLANT
SHEATH BRITE TIP 7FR 35CM (SHEATH) ×1 IMPLANT
SHEATH PINNACLE 5F 10CM (SHEATH) ×1 IMPLANT
SHEATH PROBE COVER 6X72 (BAG) ×1 IMPLANT
STENT VIABAHN 7X29X80 VBX (Permanent Stent) ×1 IMPLANT
STENT VIABAHN 7X39X80 VBX (Permanent Stent) ×1 IMPLANT
SYR MEDRAD MARK V 150ML (SYRINGE) ×1 IMPLANT
TRANSDUCER W/STOPCOCK (MISCELLANEOUS) ×3 IMPLANT
TRAY PV CATH (CUSTOM PROCEDURE TRAY) ×3 IMPLANT
WIRE BENTSON .035X145CM (WIRE) ×1 IMPLANT
WIRE TORQFLEX AUST .018X40CM (WIRE) ×1 IMPLANT

## 2019-11-26 NOTE — Discharge Instructions (Signed)
Femoral Site Care This sheet gives you information about how to care for yourself after your procedure. Your health care provider may also give you more specific instructions. If you have problems or questions, contact your health care provider. What can I expect after the procedure? After the procedure, it is common to have:  Bruising that usually fades within 1-2 weeks.  Tenderness at the site. Follow these instructions at home: Wound care  Follow instructions from your health care provider about how to take care of your insertion site. Make sure you: ? Wash your hands with soap and water before you change your bandage (dressing). If soap and water are not available, use hand sanitizer. ? Change your dressing as told by your health care provider. ? Leave stitches (sutures), skin glue, or adhesive strips in place. These skin closures may need to stay in place for 2 weeks or longer. If adhesive strip edges start to loosen and curl up, you may trim the loose edges. Do not remove adhesive strips completely unless your health care provider tells you to do that.  Do not take baths, swim, or use a hot tub until your health care provider approves.  You may shower 24-48 hours after the procedure or as told by your health care provider. ? Gently wash the site with plain soap and water. ? Pat the area dry with a clean towel. ? Do not rub the site. This may cause bleeding.  Do not apply powder or lotion to the site. Keep the site clean and dry.  Check your femoral site every day for signs of infection. Check for: ? Redness, swelling, or pain. ? Fluid or blood. ? Warmth. ? Pus or a bad smell. Activity  For the first 2-3 days after your procedure, or as long as directed: ? Avoid climbing stairs as much as possible. ? Do not squat.  Do not lift anything that is heavier than 10 lb (4.5 kg), or the limit that you are told, until your health care provider says that it is safe.  Rest as  directed. ? Avoid sitting for a long time without moving. Get up to take short walks every 1-2 hours.  Do not drive for 24 hours if you were given a medicine to help you relax (sedative). General instructions  Take over-the-counter and prescription medicines only as told by your health care provider.  Keep all follow-up visits as told by your health care provider. This is important. Contact a health care provider if you have:  A fever or chills.  You have redness, swelling, or pain around your insertion site. Get help right away if:  The catheter insertion area swells very fast.  You pass out.  You suddenly start to sweat or your skin gets clammy.  The catheter insertion area is bleeding, and the bleeding does not stop when you hold steady pressure on the area.  The area near or just beyond the catheter insertion site becomes pale, cool, tingly, or numb. These symptoms may represent a serious problem that is an emergency. Do not wait to see if the symptoms will go away. Get medical help right away. Call your local emergency services (911 in the [place] Do not drive yourself to the hospital. Summary  After the procedure, it is common to have bruising that usually fades within 1-2 weeks.  Check your femoral site every day for signs of infection.  Do not lift anything that is heavier than 10 lb (4.5 kg), or the   limit that you are told, until your health care provider says that it is safe. This information is not intended to replace advice given to you by your health care provider. Make sure you discuss any questions you have with your health care provider. Document Revised: 06/20/2017 Document Reviewed: 06/20/2017 Elsevier Patient Education  2020 [employer]vier Inc[employer]  

## 2019-11-26 NOTE — H&P (Signed)
   History and Physical Update  The patient was interviewed and re-examined.  The patient's previous History and Physical has been reviewed and is unchanged from recent office visit. Plan for aortogram with possible intervention.   Mohannad Olivero C. Randie Heinz, MD [facility] Office: [phone] Pager: [phone]  11/26/2019, 8:33 AM

## 2019-11-26 NOTE — Progress Notes (Signed)
Patient was given discharge instructions. She verbalized understanding. 

## 2019-11-26 NOTE — Op Note (Signed)
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 Randie Heinz, MD Procedure Performed: 1.  Ultrasound-guided cannulation left common femoral artery 2.  Ultrasound-guided cannulation right common femoral artery 3.  Aortogram bilateral lower extremity runoff 4.  Stent of right common iliac artery with 7 x 39 and 7 x 29 mm VBX 5.  Moderate sedation with fentanyl Versed for 50 minutes   Indications: 44 year old female with a history of left femoropopliteal bypass performed at a separate institution.  She has stenting of the right common iliac artery.  She now has recurrent rest pain on the right side with no palpable femoral pulses indicated for aortogram possible intervention.  Findings: I can actually not cannulate the left common femoral artery given the scar tissue and the small size of the common femoral artery with bypass in place.  I was able to get a micropuncture needle into it but the wire would not pass.  I subsequently cannulated the right common femoral artery this was quite diminutive.  The right common iliac artery stent was occluded we are able to get through this intraluminal plane.  We confirmed intraluminal access we then primarily stented the common iliac artery extending from the bifurcation to the hypogastric artery.  At completion there was good flow through our sheath.  Distally her left common femoral artery appeared patent as did her femoropopliteal bypass she appears to have three-vessel runoff to the foot.  On the right side she has a patent SFA which is quite diminutive she appears to have peroneal and posterior tibial artery runoff to the foot.  Patient will follow up in 4 weeks for evaluation of ABIs ultimately will likely need aortobifemoral bypass but with history of pancreatic surgery  as well as feeding tube will be quite difficult.   Procedure:  The patient was identified in the holding area and taken to room 8.  The patient was then placed supine on the table and prepped and draped in the usual sterile fashion.  A time out was called.  Ultrasound was used to evaluate the left common femoral artery initially.  There was significant scar tissue there.  There is no signs 1% lidocaine we cannulated micropuncture needle.  Unfortunately cannot get a wire to pass.  Ultimately I performed and held pressure.  I turned my attention the right side.  Again there is anesthetized cannulated with micropuncture needle with direct ultrasound visualization and images saved the permanent record.  A wire was passed.  We placed a Bentson wire was stopped just at the level of the previous stent under fluoroscopic guidance.  We placed a 5 French sheath perform retrograde angiography.  Patient was fully heparinized.  We used very catheter and Glidewire advantage to cross through the aorta compartment for little access.  Omni catheter was placed the level of L1 aortogram was performed followed by bilateral lower extremity runoff.  I elected to intervene.  I exchanged for a long 7 French sheath on the right side.  We then deployed a 7 x 39 mm VBX followed by 7 x 29 mm VBX.  Completion angiography demonstrated patency.  Satisfied we remove the wire.  Sheath we pulled in postoperative holding.  She tolerated procedure without any complication.  Contrast: 125cc    Brandon C. Randie Heinz, MD [facility] Office: [phone] Pager: [phone]

## 2019-11-27 LAB — POCT ACTIVATED CLOTTING TIME: Activated Clotting Time: 224 seconds

## 2019-11-27 MED FILL — Lidocaine HCl Local Preservative Free (PF) Inj 1%: INTRAMUSCULAR | Qty: 30 | Status: AC

## 2019-12-10 ENCOUNTER — Other Ambulatory Visit (HOSPITAL_COMMUNITY): Payer: Self-pay | Admitting: Psychiatry

## 2019-12-10 DIAGNOSIS — G4700 Insomnia, unspecified: Secondary | ICD-10-CM

## 2019-12-10 MED ORDER — ALPRAZOLAM 1 MG PO TABS: 1.0000 mg | ORAL_TABLET | Freq: Every evening | ORAL | 0 refills | Status: DC | PRN

## 2019-12-13 NOTE — Progress Notes (Signed)
Virtual Visit via Video Note  I connected with Jessica [name] on 12/19/19 at 10:00 AM EDT by a video enabled telemedicine application and verified that I am speaking with the correct person using two identifiers.   I discussed the limitations of evaluation and management by telemedicine and the availability of in person appointments. The patient expressed understanding and agreed to proceed.     I discussed the assessment and treatment plan with the patient. The patient was provided an opportunity to ask questions and all were answered. The patient agreed with the plan and demonstrated an understanding of the instructions.   The patient was advised to call back or seek an in-person evaluation if the symptoms worsen or if the condition fails to improve as anticipated.   Location: [facility]- [facility], , provider- home office   I provided 15 minutes of non-face-to-face time during this encounter.   Jessica Hotter, MD    [facility] MD/PA/NP OP Progress Note  12/19/2019 11:00 AM Jessica [name]  MRN:  [id number]  Chief Complaint:  Chief Complaint    Follow-up; Other; Anxiety; ADHD     HPI:  This is a follow-up appointment for ADHD, insomnia and depression.  She states that she has started to have middle insomnia since tapering down Xanax.  She sleeps only for a few hours.  She states that her mother was found to be cancer free after getting immunotherapy for metastasized small lung cell carcinoma.  She feels happy about it, although she has slight concern that this may be a calm before storm.  She tries to take things day by day.  She will have surgery for claudication.  She describes it as it got to be done as she has been suffering from this for many years.  She believes her mood has been good.  She is in to church.  She denies feeling depressed.  She has good motivation and energy.  She has good appetite.  She denies SI.  She has been able to focus very well when she takes Concerta.  She  occasionally feels anxious, although it has become less.  She had a panic attack a few months ago.  She is willing to try switching from Xanax to Valium for insomnia.     Visit Diagnosis:    ICD-10-CM   1. Attention deficit hyperactivity disorder (ADHD), unspecified ADHD type  F90.9 methylphenidate (CONCERTA) 27 MG PO CR tablet  2. Primary insomnia  F51.01     Past Psychiatric History: Please see initial evaluation for full details. I have reviewed the history. No updates at this time.     Past Medical History:  Past Medical History:  Diagnosis Date  . ADHD (attention deficit hyperactivity disorder)   . Anxiety   . Diabetes mellitus type I (HCC)   . Diverticulosis of colon without hemorrhage   . DM (diabetes mellitus) (HCC)    post pancreatectomy  . Feeding by G-tube (HCC)   . GERD (gastroesophageal reflux disease)   . Hashimoto's thyroiditis    euthyroid  . Hypercholesterolemia   . PONV (postoperative nausea and vomiting)     Past Surgical History:  Procedure Laterality Date  . ABDOMINAL AORTOGRAM W/LOWER EXTREMITY N/A 04/27/2017   Procedure: ABDOMINAL AORTOGRAM W/LOWER EXTREMITY;  Surgeon: Maeola Harman, MD;  Location: [facility] INVASIVE CV LAB;  Service: Cardiovascular;  Laterality: N/A;  . ABDOMINAL AORTOGRAM W/LOWER EXTREMITY Bilateral 06/04/2019   Procedure: ABDOMINAL AORTOGRAM W/LOWER EXTREMITY;  Surgeon: Maeola Harman, MD;  Location:  [facility] INVASIVE CV LAB;  Service: Cardiovascular;  Laterality: Bilateral;  . ABDOMINAL AORTOGRAM W/LOWER EXTREMITY N/A 11/26/2019   Procedure: ABDOMINAL AORTOGRAM W/ Right LOWER EXTREMITY Runoff;  Surgeon: Maeola Harman, MD;  Location: [facility] INVASIVE CV LAB;  Service: Cardiovascular;  Laterality: N/A;  . ABDOMINAL HYSTERECTOMY    . BIOPSY N/A 05/15/2014   Procedure: GASTIC,  ASCENDING, AND DESCEDNING/SIGMOID  COLON BIOPSY;  Surgeon: Corbin Ade, MD;  Location: [facility] ORS;  Service: Endoscopy;  Laterality: N/A;  .  CHOLECYSTECTOMY    . COLONOSCOPY WITH PROPOFOL N/A 05/15/2014   Dr. Jena Gauss (primary GI is Dr. Darrick Penna). Hemorrhoids, negative random colon biopsies. GI pathogen panel negative. Diverticulosis.  [name] complete hysterectomy    . ESOPHAGOGASTRODUODENOSCOPY (EGD) WITH PROPOFOL N/A 05/15/2014   Dr. Jena Gauss (primary GI Dr. Darrick Penna), Billroth II, inflamed residual gastric mucosa, benign biopsies  . FEMORAL-POPLITEAL BYPASS GRAFT    . 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  [name] KNEE SURGERY Right    X 6-5 arthroscopies and open procedure 1 time- tighten patellar tendon and loose IT band  . 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  [name] PERIPHERAL VASCULAR INTERVENTION Right 06/04/2019   Procedure: PERIPHERAL VASCULAR INTERVENTION;  Surgeon: Maeola Harman, MD;  Location: [facility] INVASIVE CV LAB;  Service: Cardiovascular;  Laterality: Right;  common iliac  . PERIPHERAL VASCULAR INTERVENTION Right 11/26/2019   Procedure: PERIPHERAL VASCULAR INTERVENTION;  Surgeon: Maeola Harman, MD;  Location: [facility] INVASIVE CV LAB;  Service: Cardiovascular;  Laterality: Right;  Iliac     Family Psychiatric History: Please see initial evaluation for full details. I have reviewed the history. No updates at this time.     Family History:  Family History  Problem Relation Age of Onset  . Pancreatic cancer Paternal Grandfather   . Cirrhosis Paternal Grandfather   . Pancreatitis Other        cousin  . Throat cancer Mother   . Depression Father   . Colon cancer Neg Hx     Social History:  Social History   Socioeconomic History  . Marital status: Married    Spouse name: Not on file  . Number of children: 0  . Years of education: Not on file  . Highest education level: Not on file  Occupational History  . Occupation: Associate Professor  Tobacco Use  . Smoking status: Current Every Day Smoker    Packs/day: 0.25    Years: 20.00    Pack years: 5.00    Types:  Cigarettes  . Smokeless tobacco: Never Used  . Tobacco comment: 4 cigs daily  Vaping Use  . Vaping Use: Some days  Substance and Sexual Activity  . Alcohol use: No    Alcohol/week: 0.0 standard drinks  . Drug use: No  . Sexual activity: Yes    Birth control/protection: None  Other Topics Concern  . Not on file  Social History Narrative  . Not on file   Social Determinants of Health   Financial Resource Strain:   . Difficulty of Paying Living Expenses:   Food Insecurity:   . Worried About Programme researcher, broadcasting/film/video in the Last Year:   . Barista in the Last Year:   Transportation Needs:   . Freight forwarder (Medical):   [name] Lack of Transportation (Non-Medical):   Physical Activity:   . Days of Exercise per Week:   . Minutes of Exercise per Session:   Stress:   . Feeling of  Stress :   Social Connections:   . Frequency of Communication with Friends and Family:   . Frequency of Social Gatherings with Friends and Family:   . Attends Religious Services:   . Active Member of Clubs or Organizations:   . Attends Archivist Meetings:   [name] Marital Status:     Allergies:  Allergies  Allergen Reactions  . Erythromycin Anaphylaxis, Hives and Nausea Only  . Levofloxacin Other (See Comments)    Burning during administration IV prior to surgery.Pt was told not to take it again.  . Niaspan [Niacin] Anaphylaxis  . Penicillins Anaphylaxis    Has patient had a PCN reaction causing immediate rash, facial/tongue/throat swelling, SOB or lightheadedness with hypotension: Yes Has patient had a PCN reaction causing severe rash involving mucus membranes or skin necrosis: No Has patient had a PCN reaction that required hospitalization: No Has patient had a PCN reaction occurring within the last 10 years: No If all of the above answers are "NO", then may proceed with Cephalosporin use.   . Shellfish Allergy Anaphylaxis  . Sulfa Antibiotics Anaphylaxis    "Blisters in my  throat"  . Wellbutrin [Bupropion] Other (See Comments)    Suicidal thoughts while on Lexapro  . Morphine Other (See Comments)    ineffective  . Strawberry Extract Hives  . Sulfamethoxazole Other (See Comments)    Blister in throat, sores in mouth  . Tape Other (See Comments)    Transpore Tape causes skin to peel;  and Nicoderm Adhesive Patch cause blisters   . Cefdinir     Unknown reaction   . Doxycycline Hives and Nausea Only    A 100mg  tablet formulation caused hives, nausea, vomiting.  [name] Keflex [Cephalexin] Other (See Comments)    Hallucinations  . Statins Other (See Comments)    Muscle pain  . Vancomycin Hives and Nausea Only  . Zetia [Ezetimibe] Diarrhea  . 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 ALPRAZolam (XANAX) 1 MG tablet Take 1 tablet (1 mg total) by mouth at bedtime as needed for sleep. 30 tablet 0  . aspirin EC 81 MG tablet Take 81 mg by mouth daily.    . clopidogrel (PLAVIX) 75 MG tablet Take 1 tablet (75 mg total) by mouth daily. 30 tablet 3  . diazepam (VALIUM) 2 MG tablet 1-2 mg at night as needed for sleep 30 tablet 1  . EPIPEN 2-PAK 0.3 MG/0.3ML SOAJ injection Inject 0.3 mg into the skin daily as needed (allergic reaction).     . Evolocumab (REPATHA) 140 MG/ML SOSY Inject 140 mg into the skin every 14 (fourteen) days.    . insulin lispro (HUMALOG) 100 UNIT/ML injection continuous. Insulin pump    .  levothyroxine (SYNTHROID) 75 MCG tablet Take 75 mcg by mouth daily before breakfast.     . methylphenidate (CONCERTA) 27 MG PO CR tablet Take 1 tablet (27 mg total) by mouth  every morning. 30 tablet 0  . [START ON 01/07/2020] methylphenidate (CONCERTA) 27 MG PO CR tablet Take 1 tablet (27 mg total) by mouth every morning. 30 tablet 0  . Multiple Vitamin (MULTIVITAMIN WITH MINERALS) TABS tablet Take 2 tablets by mouth daily.    [name] OVER THE COUNTER MEDICATION Take 2 tablets by mouth daily. D-[id number]-A otc supplement    . pantoprazole (PROTONIX) 40 MG tablet Take 80 mg by mouth at bedtime.     [name] PRESCRIPTION MEDICATION Take 2-3 capsules by mouth See admin instructions. BIOKACE * ONLY IF HAVING A MEAL - 3 caps with meals and 2 caps with snacks    . Vitamin D, Ergocalciferol, (DRISDOL) 1.25 MG (50000 UNIT) CAPS capsule Take 50,000 Units by mouth 3 (three) times a week.      No current facility-administered medications for this visit.     Musculoskeletal: Strength & Muscle Tone: N/A Gait & Station: N/A Patient leans: N/A  Psychiatric Specialty Exam: Review of Systems  Psychiatric/Behavioral: Positive for sleep disturbance. Negative for agitation, behavioral problems, confusion, decreased concentration, dysphoric mood, hallucinations, self-injury and suicidal ideas. The patient is nervous/anxious. The patient is not hyperactive.   All other systems reviewed and are negative.   There were no vitals taken for this visit.There is no height or weight on file to calculate BMI.  General Appearance: Fairly Groomed  Eye Contact:  Good  Speech:  Clear and Coherent  Volume:  Normal  Mood:  good  Affect:  Appropriate, Congruent and Full Range  Thought Process:  Coherent  Orientation:  Full (Time, Place, and Person)  Thought Content: Logical   Suicidal Thoughts:  No  Homicidal Thoughts:  No  Memory:  Immediate;   Good  Judgement:  Good  Insight:  Good  Psychomotor Activity:  Normal  Concentration:   Concentration: Good and Attention Span: Good  Recall:  Good  Fund of Knowledge: Good  Language: Good  Akathisia:  No  Handed:  Right  AIMS (if indicated): not done  Assets:  Communication Skills Desire for Improvement  ADL's:  Intact  Cognition: WNL  Sleep:  Poor   Screenings:   Assessment and Plan:  Jessica [name] is a 44 y.o. year old female with a history of ADHD, depression, hashimoto's thyroiditis,AIH/Hereditary pancreatitiss/p islet cell transplant,diabetes,s/p Roux-en- Y bypass surgery,left lower extremity bypass with a subsequent balloon angioplasty, who presents for follow up appointment for below.   1. Attention deficit hyperactivity disorder (ADHD), unspecified ADHD type *She reports significant benefit from Concerta.  We will continue current dose to target ADHD.  She is aware of its potential side effect, which includes but not limited to insomnia, worsening in anxiety and palpitation.   2. Primary insomnia She reports worsening in middle insomnia in the context of tapering down Xanax.  Will switch from Xanax to diazepam to avoid risk of tolerance.  Discussed potential risk of drowsiness.    # MDD, single episode in remission She denies significant mood symptoms except occasional anxiety.  We will continue to evaluate.   Plan 1.ContinueConcerta 27mg  daily  2. Discontinue Xanax 3. Start Valium 1-2 mg at night as needed for insomnia 4.Next appointment:8/11 at 10:20 for 20 mins, video  Past trials of medication:sertraline, fluoxetine,Paxil, citalopram, lexapro, bupropion (voices), venlafaxine, duloxetine, trintellix, mirtazapine, trazodone, amitriptyline,doxepin (insomnia),lamotrigine,Geodon, olanzapine,quetiapine,Vraylar, xanax, clonazepam,temazepam,ritalin, trazodone, Ambien, Rozerem,Lunesta, Belsomra, benadryl,  The patient demonstrates the following risk factors for suicide: Chronic risk factors for suicide include:psychiatric disorder  ofdepressionand medical illness pancreatitis. Acute risk factorsfor  suicide include: unemployment. Protective factorsfor this patient include: positive social support, coping skills and hope for the future. Considering these factors, the overall suicide risk at this point appears to below. Patientisappropriate for outpatient follow up.  Jessica Hottereina Daveigh Batty, MD 12/19/2019, 11:00 AM

## 2019-12-19 ENCOUNTER — Other Ambulatory Visit: Payer: Self-pay

## 2019-12-19 ENCOUNTER — Telehealth (INDEPENDENT_AMBULATORY_CARE_PROVIDER_SITE_OTHER): Payer: Medicaid - Out of State | Admitting: Psychiatry

## 2019-12-19 ENCOUNTER — Encounter (HOSPITAL_COMMUNITY): Payer: Self-pay | Admitting: Psychiatry

## 2019-12-19 DIAGNOSIS — F909 Attention-deficit hyperactivity disorder, unspecified type: Secondary | ICD-10-CM | POA: Diagnosis not present

## 2019-12-19 DIAGNOSIS — F5101 Primary insomnia: Secondary | ICD-10-CM | POA: Diagnosis not present

## 2019-12-19 MED ORDER — DIAZEPAM 2 MG PO TABS: ORAL_TABLET | ORAL | 1 refills | Status: DC

## 2019-12-19 MED ORDER — METHYLPHENIDATE HCL ER (OSM) 27 MG PO TBCR: 27.0000 mg | EXTENDED_RELEASE_TABLET | ORAL | 0 refills | Status: DC

## 2019-12-19 NOTE — Patient Instructions (Signed)
1.ContinueConcerta 27mg  daily  2. Discontinue Xanax 3. Start Valium 1-2 mg at night as needed for insomnia 4.Next appointment:8/11 at 10:20

## 2019-12-27 ENCOUNTER — Other Ambulatory Visit: Payer: Self-pay | Admitting: *Deleted

## 2019-12-27 DIAGNOSIS — I779 Disorder of arteries and arterioles, unspecified: Secondary | ICD-10-CM

## 2019-12-27 DIAGNOSIS — I739 Peripheral vascular disease, unspecified: Secondary | ICD-10-CM

## 2019-12-27 DIAGNOSIS — I70229 Atherosclerosis of native arteries of extremities with rest pain, unspecified extremity: Secondary | ICD-10-CM

## 2020-01-02 ENCOUNTER — Telehealth (HOSPITAL_COMMUNITY): Payer: Self-pay

## 2020-01-02 NOTE — Telephone Encounter (Signed)
Medication management - Message left for patient to call our office back if still having problems with sleep as she reported some issues while transitioning to Valium from Xanax.

## 2020-01-04 ENCOUNTER — Ambulatory Visit (HOSPITAL_COMMUNITY)
Admission: RE | Admit: 2020-01-04 | Discharge: 2020-01-04 | Disposition: A | Discharge status: Home / Self Care | Payer: Medicare (Managed Care) | Source: Ambulatory Visit | Attending: Vascular Surgery | Admitting: Vascular Surgery

## 2020-01-04 ENCOUNTER — Ambulatory Visit (INDEPENDENT_AMBULATORY_CARE_PROVIDER_SITE_OTHER)
Admit: 2020-01-04 | Discharge: 2020-01-04 | Disposition: A | Discharge status: Home / Self Care | Payer: Medicare (Managed Care) | Attending: Vascular Surgery | Admitting: Vascular Surgery

## 2020-01-04 ENCOUNTER — Encounter: Payer: Self-pay | Admitting: Vascular Surgery

## 2020-01-04 ENCOUNTER — Other Ambulatory Visit: Payer: Self-pay

## 2020-01-04 ENCOUNTER — Ambulatory Visit (INDEPENDENT_AMBULATORY_CARE_PROVIDER_SITE_OTHER): Payer: Medicare (Managed Care) | Admitting: Vascular Surgery

## 2020-01-04 VITALS — BP 122/80 | HR 96 | Temp 97.2°F | Resp 20 | Ht 66.0 in | Wt 140.0 lb

## 2020-01-04 DIAGNOSIS — I70229 Atherosclerosis of native arteries of extremities with rest pain, unspecified extremity: Secondary | ICD-10-CM | POA: Diagnosis not present

## 2020-01-04 DIAGNOSIS — I779 Disorder of arteries and arterioles, unspecified: Secondary | ICD-10-CM | POA: Diagnosis present

## 2020-01-04 DIAGNOSIS — M79604 Pain in right leg: Secondary | ICD-10-CM | POA: Diagnosis not present

## 2020-01-04 DIAGNOSIS — E1065 Type 1 diabetes mellitus with hyperglycemia: Secondary | ICD-10-CM

## 2020-01-04 DIAGNOSIS — I739 Peripheral vascular disease, unspecified: Secondary | ICD-10-CM

## 2020-01-04 DIAGNOSIS — F17200 Nicotine dependence, unspecified, uncomplicated: Secondary | ICD-10-CM | POA: Diagnosis not present

## 2020-01-04 DIAGNOSIS — M79605 Pain in left leg: Secondary | ICD-10-CM

## 2020-01-04 DIAGNOSIS — IMO0002 Reserved for concepts with insufficient information to code with codable children: Secondary | ICD-10-CM

## 2020-01-04 NOTE — Progress Notes (Signed)
Patient ID: AGAM TUOHY, female   DOB: 1976/06/19, 44 y.o.   MRN: [id number]  Reason for Consult: Post-op Follow-up   Referred by Virl Diamond, MD  Subjective:     HPI:  Jessica [name] is a 44 y.o. female follows up from stent of her right common iliac artery.  This was redo stent for recurrent stenosis.  She also has a history of left femoral to popliteal artery bypass performed in [place] intervened upon in [place] and then by me.  States that she still has some leg pain but is much better at this time particularly on the right side.  She is currently feeding via a gastrojejunostomy tube.  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  She does not have any tissue loss or ulceration at this time.  She remains on aspirin and Plavix and also takes Repatha at the discretion of her endocrinologist.  Past Medical History:  Diagnosis Date  . ADHD (attention deficit hyperactivity disorder)   . Anxiety   . Diabetes mellitus type I (HCC)   . Diverticulosis of colon without hemorrhage   . DM (diabetes mellitus) (HCC)    post pancreatectomy  . Feeding by G-tube (HCC)   . GERD (gastroesophageal reflux disease)   . Hashimoto's thyroiditis    euthyroid  . Hypercholesterolemia   . PONV (postoperative nausea and vomiting)    Family History  Problem Relation Age of Onset  . Pancreatic cancer Paternal Grandfather   . Cirrhosis Paternal Grandfather   . Pancreatitis Other        cousin  . Throat cancer Mother   . Depression Father   . Colon cancer Neg Hx    Past Surgical History:  Procedure Laterality Date  . ABDOMINAL AORTOGRAM W/LOWER EXTREMITY N/A 04/27/2017   Procedure: ABDOMINAL AORTOGRAM W/LOWER EXTREMITY;  Surgeon: Maeola Harman, MD;  Location: [facility] INVASIVE CV LAB;  Service: Cardiovascular;  Laterality: N/A;  . ABDOMINAL AORTOGRAM W/LOWER EXTREMITY Bilateral 06/04/2019   Procedure:  ABDOMINAL AORTOGRAM W/LOWER EXTREMITY;  Surgeon: Maeola Harman, MD;  Location: [facility] INVASIVE CV LAB;  Service: Cardiovascular;  Laterality: Bilateral;  . ABDOMINAL AORTOGRAM W/LOWER EXTREMITY N/A 11/26/2019   Procedure: ABDOMINAL AORTOGRAM W/ Right LOWER EXTREMITY Runoff;  Surgeon: Maeola Harman, MD;  Location: [facility] INVASIVE CV LAB;  Service: Cardiovascular;  Laterality: N/A;  . ABDOMINAL HYSTERECTOMY    . BIOPSY N/A 05/15/2014   Procedure: GASTIC,  ASCENDING, AND DESCEDNING/SIGMOID  COLON BIOPSY;  Surgeon: Corbin Ade, MD;  Location: [facility];  Service: Endoscopy;  Laterality: N/A;  . CHOLECYSTECTOMY    . COLONOSCOPY WITH PROPOFOL N/A 05/15/2014   Dr. Jena Gauss (primary GI is Dr. Darrick Penna). Hemorrhoids, negative random colon biopsies. GI pathogen panel negative. Diverticulosis.  [name] complete hysterectomy    . ESOPHAGOGASTRODUODENOSCOPY (EGD) WITH PROPOFOL N/A 05/15/2014   Dr. Jena Gauss (primary GI Dr. Darrick Penna), Billroth II, inflamed residual gastric mucosa, benign biopsies  . FEMORAL-POPLITEAL BYPASS GRAFT    . 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  [name] KNEE SURGERY Right    X 6-5 arthroscopies and open procedure 1 time- tighten patellar tendon and loose IT band  . 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  [name] PERIPHERAL VASCULAR INTERVENTION Right 06/04/2019   Procedure: PERIPHERAL VASCULAR INTERVENTION;  Surgeon: Maeola Harman, MD;  Location: [facility] INVASIVE CV LAB;  Service: Cardiovascular;  Laterality: Right;  common iliac  . PERIPHERAL VASCULAR INTERVENTION Right 11/26/2019   Procedure: PERIPHERAL VASCULAR INTERVENTION;  Surgeon: Maeola Harman, MD;  Location: [facility] INVASIVE CV LAB;  Service: Cardiovascular;  Laterality: Right;  Iliac     Short Social History:  Social History   Tobacco Use  . Smoking status: Current Every Day Smoker    Packs/day: 0.25    Years: 20.00    Pack years: 5.00    Types: Cigarettes  .  Smokeless tobacco: Never Used  . Tobacco comment: 4 cigs daily  Substance Use Topics  . Alcohol use: No    Alcohol/week: 0.0 standard drinks    Allergies  Allergen Reactions  . Erythromycin Anaphylaxis, Hives and Nausea Only  . Levofloxacin Other (See Comments)    Burning during administration IV prior to surgery.Pt was told not to take it again.  . Niaspan [Niacin] Anaphylaxis  . Penicillins Anaphylaxis    Has patient had a PCN reaction causing immediate rash, facial/tongue/throat swelling, SOB or lightheadedness with hypotension: Yes Has patient had a PCN reaction causing severe rash involving mucus membranes or skin necrosis: No Has patient had a PCN reaction that required hospitalization: No Has patient had a PCN reaction occurring within the last 10 years: No If all of the above answers are "NO", then may proceed with Cephalosporin use.   . Shellfish Allergy Anaphylaxis  . Sulfa Antibiotics Anaphylaxis    "Blisters in my throat"  . Wellbutrin [Bupropion] Other (See Comments)    Suicidal thoughts while on Lexapro  . Morphine Other (See Comments)    ineffective  . Strawberry Extract Hives  . Sulfamethoxazole Other (See Comments)    Blister in throat, sores in mouth  . Tape Other (See Comments)    Transpore Tape causes skin to peel;  and Nicoderm Adhesive Patch cause blisters   . Cefdinir     Unknown reaction   . Doxycycline Hives and Nausea Only    A 100mg  tablet formulation caused hives, nausea, vomiting.  Keflex [Cephalexin] Other (See Comments)    Hallucinations  . Statins Other (See Comments)    Muscle pain  . Vancomycin Hives and Nausea Only  . Zetia [Ezetimibe] Diarrhea  . 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 ALPRAZolam (XANAX) 1 MG tablet Take 1 tablet (1 mg total) by mouth at bedtime as needed for sleep.  30 tablet 0  . aspirin EC 81 MG tablet Take 81 mg by mouth daily.    . clopidogrel (PLAVIX) 75 MG tablet Take 1 tablet (75 mg total) by mouth daily. 30 tablet 3  . EPIPEN 2-PAK 0.3 MG/0.3ML SOAJ injection Inject 0.3 mg into the skin daily as needed (allergic reaction).     . Evolocumab (REPATHA) 140 MG/ML SOSY Inject 140 mg into the skin every 14 (fourteen) days.    . insulin lispro (HUMALOG) 100 UNIT/ML injection continuous. Insulin pump    . levothyroxine (SYNTHROID) 75 MCG tablet Take 75 mcg by mouth daily before breakfast.     . methylphenidate (CONCERTA) 27 MG PO CR tablet Take 1 tablet (27 mg total) by mouth every morning. 30 tablet 0  . [START ON 01/07/2020] methylphenidate (CONCERTA) 27 MG PO CR tablet Take 1 tablet (27 mg total) by mouth every morning. 30 tablet 0  . Multiple Vitamin (MULTIVITAMIN WITH MINERALS) TABS tablet Take 2 tablets  by mouth daily.    [name] OVER THE COUNTER MEDICATION Take 2 tablets by mouth daily. D-[id number] otc supplement    . pantoprazole (PROTONIX) 40 MG tablet Take 80 mg by mouth at bedtime.     [name] PRESCRIPTION MEDICATION Take 2-3 capsules by mouth See admin instructions. BIOKACE * ONLY IF HAVING A MEAL - 3 caps with meals and 2 caps with snacks    . Vitamin D, Ergocalciferol, (DRISDOL) 1.25 MG (50000 UNIT) CAPS capsule Take 50,000 Units by mouth 3 (three) times a week.      No current facility-administered medications for this visit.    Review of Systems  Constitutional:  Constitutional negative. HENT: HENT negative.  Eyes: Eyes negative.  Respiratory: Respiratory negative.  Cardiovascular: Positive for claudication and leg swelling.  GI: Gastrointestinal negative.  Musculoskeletal: Positive for leg pain.  Skin: Skin negative.  Neurological: Neurological negative. Hematologic: Hematologic/lymphatic negative.  Psychiatric: Psychiatric negative.        Objective:  Objective   Vitals:   01/04/20 1024  BP: 122/80  Pulse: 96  Resp: 20  Temp: (!) 97.2  F (36.2 C)  SpO2: 97%  Weight: 140 lb (63.5 kg)  Height: 5\' 6"  (1.676 m)   Body mass index is 22.6 kg/m.  Physical Exam HENT:     Head: Normocephalic.     Nose:     Comments: Wearing a mask Eyes:     Pupils: Pupils are equal, round, and reactive to light.  Cardiovascular:     Rate and Rhythm: Normal rate and regular rhythm.     Pulses:          Radial pulses are 2+ on the right side and 2+ on the left side.       Popliteal pulses are 2+ on the right side and 2+ on the left side.       Dorsalis pedis pulses are 1+ on the right side and 2+ on the left side.  Pulmonary:     Effort: Pulmonary effort is normal.  Abdominal:     General: Abdomen is flat.     Palpations: Abdomen is soft. There is no mass.  Musculoskeletal:        General: No swelling. Normal range of motion.  Skin:    General: Skin is dry.     Capillary Refill: Capillary refill takes less than 2 seconds.  Neurological:     General: No focal deficit present.     Mental Status: She is alert.  Psychiatric:        Mood and Affect: Mood normal.        Behavior: Behavior normal.        Thought Content: Thought content normal.        Judgment: Judgment normal.     Data: I have independently turbid her ABIs to be 0.88 right with toe pressure 148 and 0.94 left with toe pressure 147.  They are biphasic bilaterally.  I have independently interpreted her aortoiliac duplex.  There is a patent right common iliac artery stent with left common iliac artery greater than 50% stenosis.     Assessment/Plan:     43 year old female status post the above-noted procedures.  I discussed with her the need for total smoking cessation she is down from 2 packs daily to 4 cigarettes daily and I congratulated her on this.  Unfortunately for her aortobifemoral bypass will be likely complicated by previous multiple midline incisions including pancreatectomy and Roux-en-Y bypass as well as what appears  to be a gastrojejunostomy tube that  she currently gets tube feeds through.  She also has an insulin pump in place.  She has had a left femoropopliteal bypass of the left groin would be a redo.  Her options would be axillobifemoral bypass versus standard transperitoneal aortobifemoral bypass versus retroperitoneal aortobifemoral bypass.  She will follow-up in 3 months with repeat ABIs and aortoiliac duplex as well as carotid duplexes.  If at any time she has significant worsening of her pain she needs to be evaluated with a CT angio of the abdomen pelvis with runoff and we can plan for one of the above-noted procedures.     Maeola HarmanBrandon Christopher Dustie Brittle MD [facility]

## 2020-01-08 ENCOUNTER — Other Ambulatory Visit (HOSPITAL_COMMUNITY): Payer: Self-pay | Admitting: Psychiatry

## 2020-01-08 ENCOUNTER — Telehealth (HOSPITAL_COMMUNITY): Payer: Self-pay | Admitting: *Deleted

## 2020-01-08 DIAGNOSIS — G4700 Insomnia, unspecified: Secondary | ICD-10-CM

## 2020-01-08 MED ORDER — ALPRAZOLAM 0.5 MG PO TABS: ORAL_TABLET | ORAL | 0 refills | Status: DC

## 2020-01-08 NOTE — Telephone Encounter (Signed)
I have utilized the [place] Controlled Substances Reporting System (PMP AWARxE) to confirm adherence regarding the patient's medication. My review reveals appropriate prescription fills.  

## 2020-01-08 NOTE — Telephone Encounter (Signed)
Per pt the Valium is not working and she went back to Xanax. Per pt now the Xanax and she is about to be out. Per pt while she was on the Valium she she was not sleeping.

## 2020-01-08 NOTE — Telephone Encounter (Signed)
-   Ordered xanax 1-1.5 mg at night as needed for insomnia. Although I recall that 2 mg worked well in the past, hopefully this slightly lower dose still work for her.  - please contact pharmacy and cancel the refill of diazepam

## 2020-01-09 ENCOUNTER — Other Ambulatory Visit: Payer: Self-pay | Admitting: *Deleted

## 2020-01-09 DIAGNOSIS — I739 Peripheral vascular disease, unspecified: Secondary | ICD-10-CM

## 2020-01-10 NOTE — Telephone Encounter (Signed)
LMOM

## 2020-01-14 DIAGNOSIS — R633 Feeding difficulties, unspecified: Secondary | ICD-10-CM | POA: Insufficient documentation

## 2020-01-14 NOTE — Telephone Encounter (Signed)
LMOM

## 2020-01-15 ENCOUNTER — Telehealth (HOSPITAL_COMMUNITY): Payer: Self-pay | Admitting: *Deleted

## 2020-01-15 NOTE — Telephone Encounter (Signed)
LMOM

## 2020-01-15 NOTE — Telephone Encounter (Signed)
To verify, did she mean that she takes 0.5 mg/1 tab, then take another 0.5 mg/1 tab in a few hours? If that is the case, no problem in doing it. Advise her to continue the way she takes.

## 2020-01-15 NOTE — Telephone Encounter (Signed)
Opened in Error.

## 2020-01-16 NOTE — Telephone Encounter (Signed)
Spoke with patient and she verbalized understanding.

## 2020-01-24 NOTE — Progress Notes (Signed)
Virtual Visit via Video Note  I connected with Jessica [name] on 01/30/20 at 10:20 AM EDT by a video enabled telemedicine application and verified that I am speaking with the correct person using two identifiers.   I discussed the limitations of evaluation and management by telemedicine and the availability of in person appointments. The patient expressed understanding and agreed to proceed.     I discussed the assessment and treatment plan with the patient. The patient was provided an opportunity to ask questions and all were answered. The patient agreed with the plan and demonstrated an understanding of the instructions.   The patient was advised to call back or seek an in-person evaluation if the symptoms worsen or if the condition fails to improve as anticipated.  Location: patient- home, provider- office   I provided 12 minutes of non-face-to-face time during this encounter.   Neysa Hotter, MD    [facility] MD/PA/NP OP Progress Note  01/30/2020 10:34 AM Jessica [name]  MRN:  [id number]  Chief Complaint:  Chief Complaint    Follow-up; ADHD; Anxiety     HPI:  This is a follow-up appointment for ADHD and insomnia.  She states that she could not feel Concerta from the pharmacy.  According to the patient, she contacted the office, and has not heard back from anybody.  She agrees to try using my chart message if needed.  She states that she has not been able to get things done due to difficulty in concentration.  She has difficulty multitasking.  She is forgetful.  She feels depressed, which she attributes to feeling frustrated due to her difficulty in concentration.  Her mother is doing well.  She visits her sister in [place] state.  She conflicts with her mother at least every other day.  Although she has some pain secondary to feeding tube she has, it has been manageable.  She sleeps better when she takes Xanax 0.5 mg twice at night (one before going to bed, and one at around midnight) .   She sleeps total of six hours. She occasionally needs to take additional 0.5 mg.  She has fair energy and motivation.  She has good appetite.  She denies SI.  She has worsening in anxiety since she is off Concerta.  She denies panic attacks. She denies alcohol use or drug use.    Daily routine: household chores, watching TV, walk to mail box, calls her mother every other day at least Marital status: married Number of children: 0   Visit Diagnosis: No diagnosis found.  Past Psychiatric History: Please see initial evaluation for full details. I have reviewed the history. No updates at this time.     Past Medical History:  Past Medical History:  Diagnosis Date  . ADHD (attention deficit hyperactivity disorder)   . Anxiety   . Diabetes mellitus type I (HCC)   . Diverticulosis of colon without hemorrhage   . DM (diabetes mellitus) (HCC)    post pancreatectomy  . Feeding by G-tube (HCC)   . GERD (gastroesophageal reflux disease)   . Hashimoto's thyroiditis    euthyroid  . Hypercholesterolemia   . PONV (postoperative nausea and vomiting)     Past Surgical History:  Procedure Laterality Date  . ABDOMINAL AORTOGRAM W/LOWER EXTREMITY N/A 04/27/2017   Procedure: ABDOMINAL AORTOGRAM W/LOWER EXTREMITY;  Surgeon: Maeola Harman, MD;  Location: [facility] INVASIVE CV LAB;  Service: Cardiovascular;  Laterality: N/A;  . ABDOMINAL AORTOGRAM W/LOWER EXTREMITY Bilateral 06/04/2019   Procedure: ABDOMINAL  AORTOGRAM W/LOWER EXTREMITY;  Surgeon: Maeola Harmanain, Brandon Christopher, MD;  [facility];  Service: Cardiovascular;  Laterality: Bilateral;  . ABDOMINAL AORTOGRAM W/LOWER EXTREMITY N/A 11/26/2019   Procedure: ABDOMINAL AORTOGRAM W/ Right LOWER EXTREMITY Runoff;  Surgeon: Maeola Harmanain, Brandon Christopher, MD;  [facility];  Service: Cardiovascular;  Laterality: N/A;  . ABDOMINAL HYSTERECTOMY    . BIOPSY N/A 05/15/2014   Procedure: GASTIC,  ASCENDING, AND DESCEDNING/SIGMOID   COLON BIOPSY;  Surgeon: Corbin Adeobert M Rourk, MD;  Location: [facility];  Service: Endoscopy;  Laterality: N/A;  . CHOLECYSTECTOMY    . COLONOSCOPY WITH PROPOFOL N/A 05/15/2014   Dr. Jena Gaussourk (primary GI is Dr. Darrick Pennafields). Hemorrhoids, negative random colon biopsies. GI pathogen panel negative. Diverticulosis.  [name]. complete hysterectomy    . ESOPHAGOGASTRODUODENOSCOPY (EGD) WITH PROPOFOL N/A 05/15/2014   Dr. Jena Gaussourk (primary GI Dr. Darrick Pennafields), Billroth II, inflamed residual gastric mucosa, benign biopsies  . FEMORAL-POPLITEAL BYPASS GRAFT    . 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  [name]. KNEE SURGERY Right    X 6-5 arthroscopies and open procedure 1 time- tighten patellar tendon and loose IT band  . 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  [name]. PERIPHERAL VASCULAR INTERVENTION Right 06/04/2019   Procedure: PERIPHERAL VASCULAR INTERVENTION;  Surgeon: Maeola Harmanain, Brandon Christopher, MD;  [facility];  Service: Cardiovascular;  Laterality: Right;  common iliac  . PERIPHERAL VASCULAR INTERVENTION Right 11/26/2019   Procedure: PERIPHERAL VASCULAR INTERVENTION;  Surgeon: Maeola Harmanain, Brandon Christopher, MD;  [facility];  Service: Cardiovascular;  Laterality: Right;  Iliac     Family Psychiatric History: Please see initial evaluation for full details. I have reviewed the history. No updates at this time.     Family History:  Family History  Problem Relation Age of Onset  . Pancreatic cancer Paternal Grandfather   . Cirrhosis Paternal Grandfather   . Pancreatitis Other        cousin  . Throat cancer Mother   . Depression Father   . Colon cancer Neg Hx     Social History:  Social History   Socioeconomic History  . Marital status: Married    Spouse name: Not on file  . Number of children: 0  . Years of education: Not on file  . Highest education level: Not on file  Occupational History  . Occupation: Associate Professorpharmacy tech  Tobacco Use  . Smoking  status: Current Every Day Smoker    Packs/day: 0.25    Years: 20.00    Pack years: 5.00    Types: Cigarettes  . Smokeless tobacco: Never Used  . Tobacco comment: 4 cigs daily  Vaping Use  . Vaping Use: Some days  Substance and Sexual Activity  . Alcohol use: No    Alcohol/week: 0.0 standard drinks  . Drug use: No  . Sexual activity: Yes    Birth control/protection: None  Other Topics Concern  . Not on file  Social History Narrative  . Not on file   Social Determinants of Health   Financial Resource Strain:   . Difficulty of Paying Living Expenses:   Food Insecurity:   . Worried About Programme researcher, broadcasting/film/videounning Out of Food in the Last Year:   . Baristaan Out of Food in the Last Year:   Transportation Needs:   . Freight forwarderLack of Transportation (Medical):   [name]. Lack of Transportation (Non-Medical):   Physical Activity:   . Days of Exercise per Week:   . Minutes of  Exercise per Session:   Stress:   . Feeling of Stress :   Social Connections:   . Frequency of Communication with Friends and Family:   . Frequency of Social Gatherings with Friends and Family:   . Attends Religious Services:   . Active Member of Clubs or Organizations:   . Attends Banker Meetings:   [name] Marital Status:     Allergies:  Allergies  Allergen Reactions  . Erythromycin Anaphylaxis, Hives and Nausea Only  . Levofloxacin Other (See Comments)    Burning during administration IV prior to surgery.Pt was told not to take it again.  . Niaspan [Niacin] Anaphylaxis  . Penicillins Anaphylaxis    Has patient had a PCN reaction causing immediate rash, facial/tongue/throat swelling, SOB or lightheadedness with hypotension: Yes Has patient had a PCN reaction causing severe rash involving mucus membranes or skin necrosis: No Has patient had a PCN reaction that required hospitalization: No Has patient had a PCN reaction occurring within the last 10 years: No If all of the above answers are "NO", then may proceed with  Cephalosporin use.   . Shellfish Allergy Anaphylaxis  . Sulfa Antibiotics Anaphylaxis    "Blisters in my throat"  . Wellbutrin [Bupropion] Other (See Comments)    Suicidal thoughts while on Lexapro  . Morphine Other (See Comments)    ineffective  . Strawberry Extract Hives  . Sulfamethoxazole Other (See Comments)    Blister in throat, sores in mouth  . Tape Other (See Comments)    Transpore Tape causes skin to peel;  and Nicoderm Adhesive Patch cause blisters   . Cefdinir     Unknown reaction   . Doxycycline Hives and Nausea Only    A 100mg  tablet formulation caused hives, nausea, vomiting.  Keflex [Cephalexin] Other (See Comments)    Hallucinations  . Statins Other (See Comments)    Muscle pain  . Vancomycin Hives and Nausea Only  . Zetia [Ezetimibe] Diarrhea  . 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 ALPRAZolam (XANAX) 0.5 MG tablet 1-1.5 mg at night as needed for anxiety/sleep 90 tablet 0  . aspirin EC 81 MG tablet Take 81 mg by mouth daily.    . clopidogrel (PLAVIX) 75 MG tablet Take 1 tablet (75 mg total) by mouth daily. 30 tablet 3  . EPIPEN 2-PAK 0.3 MG/0.3ML SOAJ injection Inject 0.3 mg into the skin daily as needed (allergic reaction).     . Evolocumab (REPATHA) 140 MG/ML SOSY Inject 140 mg into the skin every 14 (fourteen) days.    . insulin lispro (HUMALOG) 100 UNIT/ML injection continuous. Insulin pump     . levothyroxine (SYNTHROID) 75 MCG tablet Take 75 mcg by mouth daily before breakfast.     . methylphenidate (CONCERTA) 27 MG PO CR tablet Take 1 tablet (27 mg total) by mouth every morning. 30 tablet 0  . methylphenidate (CONCERTA) 27 MG PO CR  tablet Take 1 tablet (27 mg total) by mouth every morning. 30 tablet 0  . Multiple Vitamin (MULTIVITAMIN WITH MINERALS) TABS tablet Take 2 tablets by mouth daily.    [name] OVER THE COUNTER MEDICATION Take 2 tablets by mouth daily. D-[id number] otc supplement    . pantoprazole (PROTONIX) 40 MG tablet Take 80 mg by mouth at bedtime.     [name] PRESCRIPTION MEDICATION Take 2-3 capsules by mouth See admin instructions. BIOKACE * ONLY IF HAVING A MEAL - 3 caps with meals and 2 caps with snacks    . Vitamin D, Ergocalciferol, (DRISDOL) 1.25 MG (50000 UNIT) CAPS capsule Take 50,000 Units by mouth 3 (three) times a week.      No current facility-administered medications for this visit.     Musculoskeletal: Strength & Muscle Tone: N/A Gait & Station: N/A Patient leans: N/A  Psychiatric Specialty Exam: Review of Systems  Psychiatric/Behavioral: Positive for decreased concentration, dysphoric mood and sleep disturbance. Negative for agitation, behavioral problems, confusion, hallucinations, self-injury and suicidal ideas. The patient is nervous/anxious. The patient is not hyperactive.   All other systems reviewed and are negative.   There were no vitals taken for this visit.There is no height or weight on file to calculate BMI.  General Appearance: Fairly Groomed  Eye Contact:  Good  Speech:  Clear and Coherent  Volume:  Normal  Mood:  Depressed  Affect:  Appropriate, Congruent and euthymic  Thought Process:  Coherent  Orientation:  Full (Time, Place, and Person)  Thought Content: Logical   Suicidal Thoughts:  No  Homicidal Thoughts:  No  Memory:  Immediate;   Good  Judgement:  Good  Insight:  Good  Psychomotor Activity:  Normal  Concentration:  Concentration:  Good and Attention Span: Good  Recall:  Good  Fund of Knowledge: Good  Language: Good  Akathisia:  No  Handed:  Right  AIMS (if indicated): not done  Assets:  Communication Skills Desire for Improvement  ADL's:  Intact  Cognition: WNL  Sleep:  Good   Screenings:   Assessment and Plan:  Jessica [name] is a 44 y.o. year old female with a history of ADHD, depression,hashimoto's thyroiditis,AIH/Hereditary pancreatitiss/p islet cell transplant,diabetes,s/p Roux-en- Y bypass surgery,left lower extremity bypass with a subsequent balloon angioplasty, who presents for follow up appointment for below.   1. Attention deficit hyperactivity disorder (ADHD), unspecified ADHD type She reports significant worsening in ADHD symptoms in the context of running out of Concerta.  We will reinitiate Concerta to target ADHD.  She is aware of the potential risks, which includes but not limited to palpitation, worsening in anxiety and insomnia.  She is advised to contact the office if she has any difficulty getting the medication from pharmacy.   2. Primary insomnia She reports good benefit from Xanax.  Noted that although it is preferable to avoid benzodiazepine, this is the only medication which has worked well for the patient after having tried several hypnotics.  Will continue current dose of Xanax for insomnia.  Discussed potential risk of dependence and drowsiness.    Plan I have reviewed and updated plans as below 1.ContinueConcerta 27mg  daily 2. Continue Xanax 0.5-1.5 mg total at night  3. Next appointment: 11/3 at 9:10 for 20 mins, video  I have utilized the [place] Controlled Substances Reporting System (PMP AW[place]) to confirm adherence regarding the patient's medication. My review reveals appropriate prescription fills.   Past trials of medication:sertraline, fluoxetine,Paxil, citalopram, lexapro, bupropion (voices), venlafaxine, duloxetine, trintellix, mirtazapine, trazodone,  amitriptyline,doxepin (insomnia),lamotrigine,Geodon, olanzapine,quetiapine,Vraylar, xanax, clonazepam,temazepam,ritalin, trazodone, Ambien, Rozerem,Lunesta, Belsomra, benadryl,  The patient demonstrates the following risk factors for suicide: Chronic risk factors for suicide include:psychiatric disorder ofdepressionand medical illness pancreatitis. Acute risk factorsfor suicide include: unemployment. Protective factorsfor this patient include: positive social support, coping skills and hope for the future. Considering these factors, the overall suicide risk at this point appears to below. Patientisappropriate for outpatient follow up.  Neysa Hotter, MD 01/30/2020, 10:34 AM

## 2020-01-30 ENCOUNTER — Other Ambulatory Visit: Payer: Self-pay

## 2020-01-30 ENCOUNTER — Telehealth (INDEPENDENT_AMBULATORY_CARE_PROVIDER_SITE_OTHER): Payer: 59 | Admitting: Psychiatry

## 2020-01-30 ENCOUNTER — Encounter (HOSPITAL_COMMUNITY): Payer: Self-pay | Admitting: Psychiatry

## 2020-01-30 DIAGNOSIS — G4700 Insomnia, unspecified: Secondary | ICD-10-CM

## 2020-01-30 DIAGNOSIS — F5101 Primary insomnia: Secondary | ICD-10-CM | POA: Diagnosis not present

## 2020-01-30 DIAGNOSIS — F909 Attention-deficit hyperactivity disorder, unspecified type: Secondary | ICD-10-CM | POA: Diagnosis not present

## 2020-01-30 MED ORDER — METHYLPHENIDATE HCL ER (OSM) 27 MG PO TBCR
27.0000 mg | EXTENDED_RELEASE_TABLET | Freq: Every day | ORAL | 0 refills | Status: DC
Start: 2020-03-30 — End: 2020-06-11

## 2020-01-30 MED ORDER — ALPRAZOLAM 0.5 MG PO TABS: ORAL_TABLET | ORAL | 2 refills | Status: DC

## 2020-01-30 MED ORDER — METHYLPHENIDATE HCL ER (OSM) 27 MG PO TBCR
27.0000 mg | EXTENDED_RELEASE_TABLET | Freq: Every day | ORAL | 0 refills | Status: DC
Start: 2020-02-29 — End: 2020-07-10

## 2020-01-30 NOTE — Patient Instructions (Signed)
1.ContinueConcerta 27mg  daily 2. Continue Xanax 0.5-1.5 mg total at night  3. Next appointment: 11/3 at 9:10

## 2020-02-06 DIAGNOSIS — K9423 Gastrostomy malfunction: Secondary | ICD-10-CM | POA: Insufficient documentation

## 2020-02-12 ENCOUNTER — Telehealth: Payer: Self-pay

## 2020-02-12 NOTE — Telephone Encounter (Signed)
Pt called with c/o superficial phlebitis after GJ tube surgeries last month. She saw PCP yesterday for this who did a venous duplex. Pt stated the results show that it is the "superficial vein (cephalic) in the forearm" impacted. She called wondering if she needed to be seen here. Per MD, she has been advised to take ibuprofen, apply heat and give it time. Pt is aware and verbalized understanding.

## 2020-02-19 ENCOUNTER — Other Ambulatory Visit: Payer: Self-pay

## 2020-02-19 DIAGNOSIS — I739 Peripheral vascular disease, unspecified: Secondary | ICD-10-CM

## 2020-02-19 MED ORDER — CLOPIDOGREL BISULFATE 75 MG PO TABS: 75.0000 mg | ORAL_TABLET | Freq: Every day | ORAL | 3 refills | Status: DC

## 2020-02-29 ENCOUNTER — Telehealth: Payer: Self-pay

## 2020-02-29 NOTE — Telephone Encounter (Signed)
Pt called with c/o worsening claudication symptoms. She has been scheduled to be seen by MD next week. She is aware and verbalized understanding. No further questions/concerns at this time.

## 2020-03-04 ENCOUNTER — Other Ambulatory Visit: Payer: Self-pay | Admitting: *Deleted

## 2020-03-04 DIAGNOSIS — I739 Peripheral vascular disease, unspecified: Secondary | ICD-10-CM

## 2020-03-05 ENCOUNTER — Ambulatory Visit (HOSPITAL_COMMUNITY)
Admission: RE | Admit: 2020-03-05 | Discharge: 2020-03-05 | Disposition: A | Discharge status: Home / Self Care | Payer: Medicare (Managed Care) | Source: Ambulatory Visit | Attending: Vascular Surgery | Admitting: Vascular Surgery

## 2020-03-05 ENCOUNTER — Other Ambulatory Visit: Payer: Self-pay

## 2020-03-05 ENCOUNTER — Encounter: Payer: Self-pay | Admitting: Vascular Surgery

## 2020-03-05 ENCOUNTER — Ambulatory Visit (INDEPENDENT_AMBULATORY_CARE_PROVIDER_SITE_OTHER): Payer: 59 | Admitting: Vascular Surgery

## 2020-03-05 VITALS — BP 116/76 | HR 77 | Resp 18 | Ht 66.0 in | Wt 146.0 lb

## 2020-03-05 DIAGNOSIS — F17200 Nicotine dependence, unspecified, uncomplicated: Secondary | ICD-10-CM

## 2020-03-05 DIAGNOSIS — IMO0002 Reserved for concepts with insufficient information to code with codable children: Secondary | ICD-10-CM

## 2020-03-05 DIAGNOSIS — I739 Peripheral vascular disease, unspecified: Secondary | ICD-10-CM

## 2020-03-05 DIAGNOSIS — E1065 Type 1 diabetes mellitus with hyperglycemia: Secondary | ICD-10-CM | POA: Diagnosis not present

## 2020-03-05 NOTE — Progress Notes (Signed)
Patient ID: Jessica [name], female   DOB: 1975/11/21, 44 y.o.   MRN: [id number]  Reason for Consult: Follow-up   Referred by Virl Diamond, MD  Subjective:     HPI:  Jessica [name] is a 44 y.o. female history of right common iliac artery stenting on 2 occasions also has a history of left femoral to popliteal artery bypass performed in [place] and intervened upon in [place] and also by myself.  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  She has a gastrojejunostomy tube which does have multiple issues but she needs for nightly feeds.  She takes aspirin, Plavix and Repatha and is followed by endocrinology.  Now having pain in her left foot particularly with walking.  She states that she cannot walk to the mailbox to get the mail.  Most of her issues since I have seen her have been pain in the right lower extremity.  She does continue to smoke 4 cigarettes daily.  Past Medical History:  Diagnosis Date  . ADHD (attention deficit hyperactivity disorder)   . Anxiety   . Diabetes mellitus type I (HCC)   . Diverticulosis of colon without hemorrhage   . DM (diabetes mellitus) (HCC)    post pancreatectomy  . Feeding by G-tube (HCC)   . GERD (gastroesophageal reflux disease)   . Hashimoto's thyroiditis    euthyroid  . Hypercholesterolemia   . PONV (postoperative nausea and vomiting)    Family History  Problem Relation Age of Onset  . Pancreatic cancer Paternal Grandfather   . Cirrhosis Paternal Grandfather   . Pancreatitis Other        cousin  . Throat cancer Mother   . Depression Father   . Colon cancer Neg Hx    Past Surgical History:  Procedure Laterality Date  . ABDOMINAL AORTOGRAM W/LOWER EXTREMITY N/A 04/27/2017   Procedure: ABDOMINAL AORTOGRAM W/LOWER EXTREMITY;  Surgeon: Maeola Harman, MD;  Location: [facility] INVASIVE CV LAB;  Service: Cardiovascular;  Laterality: N/A;  . ABDOMINAL AORTOGRAM W/LOWER  EXTREMITY Bilateral 06/04/2019   Procedure: ABDOMINAL AORTOGRAM W/LOWER EXTREMITY;  Surgeon: Maeola Harman, MD;  Location: [facility] INVASIVE CV LAB;  Service: Cardiovascular;  Laterality: Bilateral;  . ABDOMINAL AORTOGRAM W/LOWER EXTREMITY N/A 11/26/2019   Procedure: ABDOMINAL AORTOGRAM W/ Right LOWER EXTREMITY Runoff;  Surgeon: Maeola Harman, MD;  Location: [facility] INVASIVE CV LAB;  Service: Cardiovascular;  Laterality: N/A;  . ABDOMINAL HYSTERECTOMY    . BIOPSY N/A 05/15/2014   Procedure: GASTIC,  ASCENDING, AND DESCEDNING/SIGMOID  COLON BIOPSY;  Surgeon: Corbin Ade, MD;  Location: [facility];  Service: Endoscopy;  Laterality: N/A;  . CHOLECYSTECTOMY    . COLONOSCOPY WITH PROPOFOL N/A 05/15/2014   Dr. Jena Gauss (primary GI is Dr. Darrick Penna). Hemorrhoids, negative random colon biopsies. GI pathogen panel negative. Diverticulosis.  [name] complete hysterectomy    . ESOPHAGOGASTRODUODENOSCOPY (EGD) WITH PROPOFOL N/A 05/15/2014   Dr. Jena Gauss (primary GI Dr. Darrick Penna), Billroth II, inflamed residual gastric mucosa, benign biopsies  . FEMORAL-POPLITEAL BYPASS GRAFT    . 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  [name] KNEE SURGERY Right    X 6-5 arthroscopies and open procedure 1 time- tighten patellar tendon and loose IT band  . 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  [name] PERIPHERAL VASCULAR INTERVENTION Right 06/04/2019   Procedure: PERIPHERAL VASCULAR INTERVENTION;  Surgeon: Maeola Harman, MD;  Location: [facility] INVASIVE CV LAB;  Service: Cardiovascular;  Laterality: Right;  common iliac  . PERIPHERAL VASCULAR INTERVENTION Right 11/26/2019   Procedure: PERIPHERAL VASCULAR INTERVENTION;  Surgeon: Maeola Harman, MD;  Location: [facility] INVASIVE CV LAB;  Service: Cardiovascular;  Laterality: Right;  Iliac     Short Social History:  Social History   Tobacco Use  . Smoking status: Current Every Day Smoker    Packs/day: 0.25    Years: 20.00     Pack years: 5.00    Types: Cigarettes  . Smokeless tobacco: Never Used  . Tobacco comment: 4 cigs daily  Substance Use Topics  . Alcohol use: No    Alcohol/week: 0.0 standard drinks    Allergies  Allergen Reactions  . Erythromycin Anaphylaxis, Hives and Nausea Only  . Levofloxacin Other (See Comments)    Burning during administration IV prior to surgery.Pt was told not to take it again.  . Niaspan [Niacin] Anaphylaxis  . Penicillins Anaphylaxis    Has patient had a PCN reaction causing immediate rash, facial/tongue/throat swelling, SOB or lightheadedness with hypotension: Yes Has patient had a PCN reaction causing severe rash involving mucus membranes or skin necrosis: No Has patient had a PCN reaction that required hospitalization: No Has patient had a PCN reaction occurring within the last 10 years: No If all of the above answers are "NO", then may proceed with Cephalosporin use.   . Shellfish Allergy Anaphylaxis  . Sulfa Antibiotics Anaphylaxis    "Blisters in my throat"  . Wellbutrin [Bupropion] Other (See Comments)    Suicidal thoughts while on Lexapro  . Morphine Other (See Comments)    ineffective  . Strawberry Extract Hives  . Sulfamethoxazole Other (See Comments)    Blister in throat, sores in mouth  . Tape Other (See Comments)    Transpore Tape causes skin to peel;  and Nicoderm Adhesive Patch cause blisters   . Cefdinir     Unknown reaction   . Doxycycline Hives and Nausea Only    A 100mg  tablet formulation caused hives, nausea, vomiting.  Keflex [Cephalexin] Other (See Comments)    Hallucinations  . Statins Other (See Comments)    Muscle pain  . Vancomycin Hives and Nausea Only  . Zetia [Ezetimibe] Diarrhea  . 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 ALPRAZolam (XANAX) 0.5 MG tablet 1-1.5 mg at night as  needed for anxiety/sleep 90 tablet 2  . aspirin EC 81 MG tablet Take 81 mg by mouth daily.    . clopidogrel (PLAVIX) 75 MG tablet Take 1 tablet (75 mg total) by mouth daily. 90 tablet 3  . EPIPEN 2-PAK 0.3 MG/0.3ML SOAJ injection Inject 0.3 mg into the skin daily as needed (allergic reaction).     . Evolocumab (REPATHA) 140 MG/ML SOSY Inject 140 mg into the skin every 14 (fourteen) days.    . insulin lispro (HUMALOG) 100 UNIT/ML injection continuous. Insulin pump    . levothyroxine (SYNTHROID) 75 MCG tablet Take 75 mcg by mouth daily before breakfast.     . methylphenidate (CONCERTA) 27 MG PO CR tablet Take 1 tablet (27 mg total) by mouth every morning. 30 tablet 0  . methylphenidate (CONCERTA) 27 MG PO CR tablet Take 1 tablet (27 mg total) by mouth every morning. 30 tablet 0  . methylphenidate (CONCERTA) 27 MG PO CR tablet Take 1 tablet (27 mg total)  by mouth daily before breakfast. 30 tablet 0  . [START ON 03/30/2020] methylphenidate (CONCERTA) 27 MG PO CR tablet Take 1 tablet (27 mg total) by mouth daily before breakfast. 30 tablet 0  . Multiple Vitamin (MULTIVITAMIN WITH MINERALS) TABS tablet Take 2 tablets by mouth daily.    [name]. OVER THE COUNTER MEDICATION Take 2 tablets by mouth daily. D-E-K-A otc supplement    . pantoprazole (PROTONIX) 40 MG tablet Take 80 mg by mouth at bedtime.     [name]. PRESCRIPTION MEDICATION Take 2-3 capsules by mouth See admin instructions. BIOKACE * ONLY IF HAVING A MEAL - 3 caps with meals and 2 caps with snacks    . Vitamin D, Ergocalciferol, (DRISDOL) 1.25 MG (50000 UNIT) CAPS capsule Take 50,000 Units by mouth 3 (three) times a week.      No current facility-administered medications for this visit.    Review of Systems  Constitutional:  Constitutional negative. HENT: HENT negative.  Eyes: Eyes negative.  Cardiovascular: Positive for claudication.  GI: Gastrointestinal negative.  Musculoskeletal: Positive for leg pain.  Skin: Skin negative.  Neurological:  Neurological negative. Hematologic: Hematologic/lymphatic negative.  Psychiatric: Psychiatric negative.        Objective:  Objective   Vitals:   03/05/20 0856  BP: 116/76  Pulse: 77  Resp: 18  SpO2: 100%  Weight: 146 lb (66.2 kg)  Height: 5\' 6"  (1.676 m)   Body mass index is 23.57 kg/m.  Physical Exam HENT:     Head: Normocephalic.     Nose:     Comments: Wearing a [place] [employer] mask Eyes:     Pupils: Pupils are equal, round, and reactive to light.  Cardiovascular:     Rate and Rhythm: Normal rate.     Pulses:          Femoral pulses are 1+ on the right side and 1+ on the left side.      Dorsalis pedis pulses are 1+ on the right side and 1+ on the left side.  Pulmonary:     Effort: Pulmonary effort is normal.  Abdominal:     General: Abdomen is flat.     Comments: Gastriojejunostomy tube in place, midline incision well-healed, insulin pump in place  Skin:    Capillary Refill: Capillary refill takes less than 2 seconds.  Neurological:     General: No focal deficit present.     Mental Status: She is alert.  Psychiatric:        Mood and Affect: Mood normal.        Behavior: Behavior normal.        Thought Content: Thought content normal.        Judgment: Judgment normal.     Data: I have independently turbid her resting ABIs to be biphasic bilaterally 0.93 on the right and 0.99 on the left with toe pressure 229 on the right and 141 on the left.     Assessment/Plan:    44 year old female with above-noted procedures.  She is only smoking 4 cigarettes/day does have recurrent pain particularly in her left lower extremity with weakly palpable femoral pulses.  Exercise ABIs were not performed.  We will get a CT angio of her abdomen pelvis with runoff to evaluate.  Unfortunately from an endovascular standpoint she does have very small vessels with her continuing to smoke I think this is all doomed for failure.  I have discussed with her that I would not consider  any other procedures until she is  completely free of smoking.  She most likely would benefit from aortobifemoral bypass grafting but unfortunately has a gastrojejunostomy tube in place also has previous history of total pancreatectomy currently requires insulin pump.  I discussed with her the difficult nature of such a procedure and she demonstrates good understanding.  We will get the CT scan and in the interim she will attempt to fully quit smoking we can discuss her surgical options at the time in the next 5 to 6 weeks.      Maeola Harman MD [facility]

## 2020-03-06 ENCOUNTER — Other Ambulatory Visit: Payer: Self-pay

## 2020-03-06 DIAGNOSIS — I779 Disorder of arteries and arterioles, unspecified: Secondary | ICD-10-CM

## 2020-03-14 ENCOUNTER — Telehealth: Payer: Self-pay

## 2020-03-14 NOTE — Telephone Encounter (Signed)
2:53pm: [place] with the Pre-Surgical center at the [facility] in [place]. She was requesting a Medication Clearance for Plavix so that the patient is able to undergo having an EGD with PEG placement on 03/25/2020 with Dr. Margy Clarks. Per her message it was ok to call patient with the info and I did not need to return her call.   I consulted with Dr. Henry Russel regarding patient holding Plavix for her procedure. After reviewing patient's chart, he states it is ok for patient to hold Plavix 5 days prior to her procedure and is ok to resume after.   I contacted patient to notify and answer any questions she had. Patient voiced her understanding. She also wanted me to inform Dr. Henry Russel that she is down to 2 cigarettes daily. I ensured her I would rely the message.

## 2020-03-24 DIAGNOSIS — T85528A Displacement of other gastrointestinal prosthetic devices, implants and grafts, initial encounter: Secondary | ICD-10-CM | POA: Insufficient documentation

## 2020-03-31 ENCOUNTER — Encounter (HOSPITAL_COMMUNITY): Payer: Medicare (Managed Care)

## 2020-04-11 ENCOUNTER — Encounter: Payer: Self-pay | Admitting: Vascular Surgery

## 2020-04-11 ENCOUNTER — Ambulatory Visit
Admission: RE | Admit: 2020-04-11 | Discharge: 2020-04-11 | Disposition: A | Discharge status: Home / Self Care | Payer: 59 | Source: Ambulatory Visit | Attending: Vascular Surgery | Admitting: Vascular Surgery

## 2020-04-11 ENCOUNTER — Other Ambulatory Visit: Payer: Self-pay

## 2020-04-11 ENCOUNTER — Encounter (HOSPITAL_COMMUNITY): Payer: Medicare (Managed Care)

## 2020-04-11 ENCOUNTER — Ambulatory Visit (HOSPITAL_COMMUNITY)
Admission: RE | Admit: 2020-04-11 | Discharge: 2020-04-11 | Disposition: A | Discharge status: Home / Self Care | Payer: Medicaid - Out of State | Source: Ambulatory Visit | Attending: Vascular Surgery | Admitting: Vascular Surgery

## 2020-04-11 ENCOUNTER — Ambulatory Visit (INDEPENDENT_AMBULATORY_CARE_PROVIDER_SITE_OTHER): Payer: 59 | Admitting: Vascular Surgery

## 2020-04-11 VITALS — BP 119/80 | HR 85 | Temp 97.9°F | Resp 20 | Ht 66.0 in | Wt 147.7 lb

## 2020-04-11 DIAGNOSIS — F17200 Nicotine dependence, unspecified, uncomplicated: Secondary | ICD-10-CM

## 2020-04-11 DIAGNOSIS — I739 Peripheral vascular disease, unspecified: Secondary | ICD-10-CM | POA: Diagnosis present

## 2020-04-11 DIAGNOSIS — IMO0002 Reserved for concepts with insufficient information to code with codable children: Secondary | ICD-10-CM

## 2020-04-11 DIAGNOSIS — E1065 Type 1 diabetes mellitus with hyperglycemia: Secondary | ICD-10-CM | POA: Diagnosis not present

## 2020-04-11 DIAGNOSIS — I779 Disorder of arteries and arterioles, unspecified: Secondary | ICD-10-CM | POA: Diagnosis not present

## 2020-04-11 MED ORDER — IOPAMIDOL (ISOVUE-370) INJECTION 76%
125.0000 mL | Freq: Once | INTRAVENOUS | Status: AC | PRN
  Administered 2020-04-11: 125 mL via INTRAVENOUS

## 2020-04-11 NOTE — Progress Notes (Signed)
Patient ID: Jessica [name], female   DOB: 06/30/1975, 44 y.o.   MRN: [id number]  Reason for Consult: Follow-up   Referred by Virl Diamond, MD  Subjective:     HPI:  Jessica [name] is a 44 y.o. female previous history of right common iliac artery stenting on 2 occasions and previous left femoral to popliteal artery bypass in [place] subsequently intervened upon in [place] and also by myself.  Also has a history of total pancreatectomy and subsequent Roux-en-Y bypass and has a gastrojejunostomy tube currently which is working.  She has a G port for drainage.  She does take aspirin and Plavix she is also on Repatha by endocrinology.  She states that her right foot is probably the worse at this time.  She has 25 yard claudication.  She states this is about 10 feet when walking up hills.  She does stop 6 times at least walking to her mailbox and back which is 100 yards from her house.  She does not have any tissue loss or ulceration.  She is down to 1 cigarette daily at this time.  Past Medical History:  Diagnosis Date  . ADHD (attention deficit hyperactivity disorder)   . Anxiety   . Diabetes mellitus type I (HCC)   . Diverticulosis of colon without hemorrhage   . DM (diabetes mellitus) (HCC)    post pancreatectomy  . Feeding by G-tube (HCC)   . GERD (gastroesophageal reflux disease)   . Hashimoto's thyroiditis    euthyroid  . Hypercholesterolemia   . PONV (postoperative nausea and vomiting)    Family History  Problem Relation Age of Onset  . Pancreatic cancer Paternal Grandfather   . Cirrhosis Paternal Grandfather   . Pancreatitis Other        cousin  . Throat cancer Mother   . Depression Father   . Colon cancer Neg Hx    Past Surgical History:  Procedure Laterality Date  . ABDOMINAL AORTOGRAM W/LOWER EXTREMITY N/A 04/27/2017   Procedure: ABDOMINAL AORTOGRAM W/LOWER EXTREMITY;  Surgeon: Maeola Harman, MD;  Location: [facility] INVASIVE CV LAB;  Service: Cardiovascular;   Laterality: N/A;  . ABDOMINAL AORTOGRAM W/LOWER EXTREMITY Bilateral 06/04/2019   Procedure: ABDOMINAL AORTOGRAM W/LOWER EXTREMITY;  Surgeon: Maeola Harman, MD;  Location[facility] INVASIVE CV LAB;  Service: Cardiovascular;  Laterality: Bilateral;  . ABDOMINAL AORTOGRAM W/LOWER EXTREMITY N/A 11/26/2019   Procedure: ABDOMINAL AORTOGRAM W/ Right LOWER EXTREMITY Runoff;  Surgeon: Maeola Harman, MD;  Location: [facility] INVASIVE CV LAB;  Service: Cardiovascular;  Laterality: N/A;  . ABDOMINAL HYSTERECTOMY    . BIOPSY N/A 05/15/2014   Procedure: GASTIC,  ASCENDING, AND DESCEDNING/SIGMOID  COLON BIOPSY;  Surgeon: Corbin Ade, MD;  Location: [facility];  Service: Endoscopy;  Laterality: N/A;  . CHOLECYSTECTOMY    . COLONOSCOPY WITH PROPOFOL N/A 05/15/2014   Dr. Jena Gauss (primary GI is Dr. Darrick Penna). Hemorrhoids, negative random colon biopsies. GI pathogen panel negative. Diverticulosis.  [name] complete hysterectomy    . ESOPHAGOGASTRODUODENOSCOPY (EGD) WITH PROPOFOL N/A 05/15/2014   Dr. Jena Gauss (primary GI Dr. Darrick Penna), Billroth II, inflamed residual gastric mucosa, benign biopsies  . FEMORAL-POPLITEAL BYPASS GRAFT    . 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  [name] KNEE SURGERY Right    X 6-5 arthroscopies and open procedure 1 time- tighten patellar tendon and loose IT band  . 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  [name] PERIPHERAL VASCULAR INTERVENTION Right 06/04/2019   Procedure: PERIPHERAL VASCULAR  INTERVENTION;  Surgeon: Maeola Harman, MD;  Location: [facility] INVASIVE CV LAB;  Service: Cardiovascular;  Laterality: Right;  common iliac  . PERIPHERAL VASCULAR INTERVENTION Right 11/26/2019   Procedure: PERIPHERAL VASCULAR INTERVENTION;  Surgeon: Maeola Harman, MD;  Location: [facility] INVASIVE CV LAB;  Service: Cardiovascular;  Laterality: Right;  Iliac     Short Social History:  Social History   Tobacco Use  . Smoking status: Light Tobacco  Smoker    Packs/day: 0.25    Years: 20.00    Pack years: 5.00    Types: Cigarettes  . Smokeless tobacco: Never Used  . Tobacco comment: 1 cig daily  Substance Use Topics  . Alcohol use: No    Alcohol/week: 0.0 standard drinks    Allergies  Allergen Reactions  . Erythromycin Anaphylaxis, Hives and Nausea Only  . Levofloxacin Other (See Comments)    Burning during administration IV prior to surgery.Pt was told not to take it again.  . Niaspan [Niacin] Anaphylaxis  . Penicillins Anaphylaxis    Has patient had a PCN reaction causing immediate rash, facial/tongue/throat swelling, SOB or lightheadedness with hypotension: Yes Has patient had a PCN reaction causing severe rash involving mucus membranes or skin necrosis: No Has patient had a PCN reaction that required hospitalization: No Has patient had a PCN reaction occurring within the last 10 years: No If all of the above answers are "NO", then may proceed with Cephalosporin use.   . Shellfish Allergy Anaphylaxis  . Sulfa Antibiotics Anaphylaxis    "Blisters in my throat"  . Wellbutrin [Bupropion] Other (See Comments)    Suicidal thoughts while on Lexapro  . Morphine Other (See Comments)    ineffective  . Strawberry Extract Hives  . Sulfamethoxazole Other (See Comments)    Blister in throat, sores in mouth  . Tape Other (See Comments)    Transpore Tape causes skin to peel;  and Nicoderm Adhesive Patch cause blisters   . Cefdinir     Unknown reaction   . Doxycycline Hives and Nausea Only    A 100mg  tablet formulation caused hives, nausea, vomiting.  Keflex [Cephalexin] Other (See Comments)    Hallucinations  . Statins Other (See Comments)    Muscle pain  . Vancomycin Hives and Nausea Only  . Zetia [Ezetimibe] Diarrhea  . 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  ALPRAZolam (XANAX) 0.5 MG tablet 1-1.5 mg at night as needed for anxiety/sleep 90 tablet 2  . aspirin EC 81 MG tablet Take 81 mg by mouth daily.    . clopidogrel (PLAVIX) 75 MG tablet Take 1 tablet (75 mg total) by mouth daily. 90 tablet 3  . EPIPEN 2-PAK 0.3 MG/0.3ML SOAJ injection Inject 0.3 mg into the skin daily as needed (allergic reaction).     . Evolocumab (REPATHA) 140 MG/ML SOSY Inject 140 mg into the skin every 14 (fourteen) days.    . insulin lispro (HUMALOG) 100 UNIT/ML injection continuous. Insulin pump    . levothyroxine (SYNTHROID) 75 MCG tablet Take 75 mcg by mouth daily before breakfast.     . methylphenidate (CONCERTA) 27 MG PO CR tablet Take 1 tablet (27 mg total) by mouth every morning. 30 tablet 0  . methylphenidate (CONCERTA) 27 MG PO CR tablet Take 1 tablet (27 mg total) by mouth every morning. 30 tablet 0  .  methylphenidate (CONCERTA) 27 MG PO CR tablet Take 1 tablet (27 mg total) by mouth daily before breakfast. 30 tablet 0  . Multiple Vitamin (MULTIVITAMIN WITH MINERALS) TABS tablet Take 2 tablets by mouth daily.    [name]. OVER THE COUNTER MEDICATION Take 2 tablets by mouth daily. D-E-K-A otc supplement    . pantoprazole (PROTONIX) 40 MG tablet Take 80 mg by mouth at bedtime.     [name]. PRESCRIPTION MEDICATION Take 2-3 capsules by mouth See admin instructions. BIOKACE * ONLY IF HAVING A MEAL - 3 caps with meals and 2 caps with snacks    . Vitamin D, Ergocalciferol, (DRISDOL) 1.25 MG (50000 UNIT) CAPS capsule Take 50,000 Units by mouth 3 (three) times a week.     . methylphenidate (CONCERTA) 27 MG PO CR tablet Take 1 tablet (27 mg total) by mouth daily before breakfast. 30 tablet 0   No current facility-administered medications for this visit.    Review of Systems  Constitutional:  Constitutional negative. HENT: HENT negative.  Eyes: Eyes negative.  Cardiovascular: Positive for claudication.  GI: Positive for abdominal pain.  Musculoskeletal: Musculoskeletal negative.  Skin:  Skin negative.  Neurological: Neurological negative. Hematologic: Hematologic/lymphatic negative.  Psychiatric: Psychiatric negative.        Objective:  Objective   Vitals:   04/11/20 1129  BP: 119/80  Pulse: 85  Resp: 20  Temp: 97.9 F (36.6 C)  SpO2: 95%  Weight: 147 lb 11.2 oz (67 kg)  Height: 5\' 6"  (1.676 m)   Body mass index is 23.84 kg/m.  Physical Exam HENT:     Head: Normocephalic.     Nose:     Comments: Wearing a mask  Eyes:     Pupils: Pupils are equal, round, and reactive to light.  Cardiovascular:     Pulses:          Radial pulses are 2+ on the right side and 2+ on the left side.       Femoral pulses are 0 on the right side and 0 on the left side. Pulmonary:     Effort: Pulmonary effort is normal.  Abdominal:     General: Abdomen is flat.     Palpations: Abdomen is soft.     Comments: Well-healed midline incision, G-tube in place  Skin:    General: Skin is warm and dry.     Capillary Refill: Capillary refill takes 2 to 3 seconds.  Neurological:     General: No focal deficit present.     Mental Status: She is alert.  Psychiatric:        Mood and Affect: Mood normal.        Behavior: Behavior normal.        Thought Content: Thought content normal.        Judgment: Judgment normal.     Data: I have independently interpreted her carotid duplex to be 1 to 39% stenosis bilaterally.  CTA I reviewed the CT images with the patient.  Formal read is pending.     Assessment/Plan:     44 year old female with above-noted very short distance claudication.  Previous right common iliac artery stenting.  I discussed with her the need for complete smoking cessation and she states that she will stop by November 1.  She will continue to walk as much as possible.  I will see her back in 3 to 4 months with exercise study.  Eventually she is likely to require aortobifemoral bypass.  I have reviewed her  images she is possibly a candidate from a transperitoneal  approach would otherwise need a retroperitoneal approach although I think this may be more difficult given her previous pancreatectomy.  Potentially with complete smoking cessation some of her claudication will improve although with her severe iliac disease I think this is unlikely.  She demonstrates good understanding I will see her back in 3 to 4 months.     Maeola Harman MD [facility]

## 2020-04-14 ENCOUNTER — Other Ambulatory Visit: Payer: Self-pay

## 2020-04-14 DIAGNOSIS — I779 Disorder of arteries and arterioles, unspecified: Secondary | ICD-10-CM

## 2020-04-16 NOTE — Progress Notes (Deleted)
BH MD/PA/NP OP Progress Note  04/16/2020 3:15 PM Jessica FudgeRebecca L [name]  MRN:  [id number]  Chief Complaint:  HPI: *** Visit Diagnosis: No diagnosis found.  Past Psychiatric History: Please see initial evaluation for full details. I have reviewed the history. No updates at this time.     Past Medical History:  Past Medical History:  Diagnosis Date  . ADHD (attention deficit hyperactivity disorder)   . Anxiety   . Diabetes mellitus type I (HCC)   . Diverticulosis of colon without hemorrhage   . DM (diabetes mellitus) (HCC)    post pancreatectomy  . Feeding by G-tube (HCC)   . GERD (gastroesophageal reflux disease)   . Hashimoto's thyroiditis    euthyroid  . Hypercholesterolemia   . PONV (postoperative nausea and vomiting)     Past Surgical History:  Procedure Laterality Date  . ABDOMINAL AORTOGRAM W/LOWER EXTREMITY N/A 04/27/2017   Procedure: ABDOMINAL AORTOGRAM W/LOWER EXTREMITY;  Surgeon: Maeola Harmanain, Brandon Christopher, MD;  [facility];  Service: Cardiovascular;  Laterality: N/A;  . ABDOMINAL AORTOGRAM W/LOWER EXTREMITY Bilateral 06/04/2019   Procedure: ABDOMINAL AORTOGRAM W/LOWER EXTREMITY;  Surgeon: Maeola Harmanain, Brandon Christopher, MD;  [facility];  Service: Cardiovascular;  Laterality: Bilateral;  . ABDOMINAL AORTOGRAM W/LOWER EXTREMITY N/A 11/26/2019   Procedure: ABDOMINAL AORTOGRAM W/ Right LOWER EXTREMITY Runoff;  Surgeon: Maeola Harmanain, Brandon Christopher, MD;  [facility];  Service: Cardiovascular;  Laterality: N/A;  . ABDOMINAL HYSTERECTOMY    . BIOPSY N/A 05/15/2014   Procedure: GASTIC,  ASCENDING, AND DESCEDNING/SIGMOID  COLON BIOPSY;  Surgeon: Corbin Adeobert M Rourk, MD;  Location: [facility];  Service: Endoscopy;  Laterality: N/A;  . CHOLECYSTECTOMY    . COLONOSCOPY WITH PROPOFOL N/A 05/15/2014   Dr. Jena Gaussourk (primary GI is Dr. Darrick Pennafields). Hemorrhoids, negative random colon biopsies. GI pathogen panel negative. Diverticulosis.  [name]. complete hysterectomy     . ESOPHAGOGASTRODUODENOSCOPY (EGD) WITH PROPOFOL N/A 05/15/2014   Dr. Jena Gaussourk (primary GI Dr. Darrick Pennafields), Billroth II, inflamed residual gastric mucosa, benign biopsies  . FEMORAL-POPLITEAL BYPASS GRAFT    . 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  [name]. KNEE SURGERY Right    X 6-5 arthroscopies and open procedure 1 time- tighten patellar tendon and loose IT band  . 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  [name]. PERIPHERAL VASCULAR INTERVENTION Right 06/04/2019   Procedure: PERIPHERAL VASCULAR INTERVENTION;  Surgeon: Maeola Harmanain, Brandon Christopher, MD;  [facility];  Service: Cardiovascular;  Laterality: Right;  common iliac  . PERIPHERAL VASCULAR INTERVENTION Right 11/26/2019   Procedure: PERIPHERAL VASCULAR INTERVENTION;  Surgeon: Maeola Harmanain, Brandon Christopher, MD;  [facility];  Service: Cardiovascular;  Laterality: Right;  Iliac     Family Psychiatric History: Please see initial evaluation for full details. I have reviewed the history. No updates at this time.     Family History:  Family History  Problem Relation Age of Onset  . Pancreatic cancer Paternal Grandfather   . Cirrhosis Paternal Grandfather   . Pancreatitis Other        cousin  . Throat cancer Mother   . Depression Father   . Colon cancer Neg Hx     Social History:  Social History   Socioeconomic History  . Marital status: Married    Spouse name: Not on file  . Number of children: 0  . Years of education: Not on file  . Highest education level: Not on file  Occupational History  . Occupation:  pharmacy tech  Tobacco Use  . Smoking status: Light Tobacco Smoker    Packs/day: 0.25    Years: 20.00    Pack years: 5.00    Types: Cigarettes  . Smokeless tobacco: Never Used  . Tobacco comment: 1 cig daily  Vaping Use  . Vaping Use: Some days  Substance and Sexual Activity  . Alcohol use: No    Alcohol/week: 0.0 standard drinks  . Drug use: No  .  Sexual activity: Yes    Birth control/protection: None  Other Topics Concern  . Not on file  Social History Narrative  . Not on file   Social Determinants of Health   Financial Resource Strain:   . Difficulty of Paying Living Expenses: Not on file  Food Insecurity:   . Worried About Programme researcher, broadcasting/film/video in the Last Year: Not on file  . Ran Out of Food in the Last Year: Not on file  Transportation Needs:   . Lack of Transportation (Medical): Not on file  . Lack of Transportation (Non-Medical): Not on file  Physical Activity:   . Days of Exercise per Week: Not on file  . Minutes of Exercise per Session: Not on file  Stress:   . Feeling of Stress : Not on file  Social Connections:   . Frequency of Communication with Friends and Family: Not on file  . Frequency of Social Gatherings with Friends and Family: Not on file  . Attends Religious Services: Not on file  . Active Member of Clubs or Organizations: Not on file  . Attends Banker Meetings: Not on file  . Marital Status: Not on file    Allergies:  Allergies  Allergen Reactions  . Erythromycin Anaphylaxis, Hives and Nausea Only  . Levofloxacin Other (See Comments)    Burning during administration IV prior to surgery.Pt was told not to take it again.  . Niaspan [Niacin] Anaphylaxis  . Penicillins Anaphylaxis    Has patient had a PCN reaction causing immediate rash, facial/tongue/throat swelling, SOB or lightheadedness with hypotension: Yes Has patient had a PCN reaction causing severe rash involving mucus membranes or skin necrosis: No Has patient had a PCN reaction that required hospitalization: No Has patient had a PCN reaction occurring within the last 10 years: No If all of the above answers are "NO", then may proceed with Cephalosporin use.   . Shellfish Allergy Anaphylaxis  . Sulfa Antibiotics Anaphylaxis    "Blisters in my throat"  . Wellbutrin [Bupropion] Other (See Comments)    Suicidal thoughts  while on Lexapro  . Morphine Other (See Comments)    ineffective  . Strawberry Extract Hives  . Sulfamethoxazole Other (See Comments)    Blister in throat, sores in mouth  . Tape Other (See Comments)    Transpore Tape causes skin to peel;  and Nicoderm Adhesive Patch cause blisters   . Cefdinir     Unknown reaction   . Doxycycline Hives and Nausea Only    A 100mg  tablet formulation caused hives, nausea, vomiting.  Keflex [Cephalexin] Other (See Comments)    Hallucinations  . Statins Other (See Comments)    Muscle pain  . Vancomycin Hives and Nausea Only  . Zetia [Ezetimibe] Diarrhea  . 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 ALPRAZolam (XANAX) 0.5 MG tablet 1-1.5 mg at night as needed for anxiety/sleep 90 tablet 2  . aspirin EC 81 MG tablet Take 81 mg by mouth daily.    . clopidogrel (PLAVIX) 75 MG tablet Take 1 tablet (75 mg total) by mouth daily. 90 tablet 3  . EPIPEN 2-PAK 0.3 MG/0.3ML SOAJ injection Inject 0.3 mg into the skin daily as needed (allergic reaction).     . Evolocumab (REPATHA) 140 MG/ML SOSY Inject 140 mg into the skin every 14 (fourteen) days.    . insulin lispro (HUMALOG) 100 UNIT/ML injection continuous. Insulin pump    . levothyroxine (SYNTHROID) 75 MCG tablet Take 75 mcg by mouth daily before breakfast.     . methylphenidate (CONCERTA) 27 MG PO CR tablet Take 1 tablet (27 mg total) by mouth every  morning. 30 tablet 0  . methylphenidate (CONCERTA) 27 MG PO CR tablet Take 1 tablet (27 mg total) by mouth every morning. 30 tablet 0  . methylphenidate (CONCERTA) 27 MG PO CR tablet Take 1 tablet (27 mg total) by mouth daily before breakfast. 30 tablet 0  . methylphenidate (CONCERTA) 27 MG PO CR tablet Take 1 tablet (27 mg total) by mouth daily before breakfast. 30 tablet 0  . Multiple Vitamin (MULTIVITAMIN WITH MINERALS) TABS tablet Take 2 tablets by mouth daily.    [name] OVER THE COUNTER MEDICATION Take 2 tablets by mouth daily. D-[id number] otc supplement    . pantoprazole (PROTONIX) 40 MG tablet Take 80 mg by mouth at bedtime.     [name] PRESCRIPTION MEDICATION Take 2-3 capsules by mouth See admin instructions. BIOKACE * ONLY IF HAVING A MEAL - 3 caps with meals and 2 caps with snacks    . Vitamin D, Ergocalciferol, (DRISDOL) 1.25 MG (50000 UNIT) CAPS capsule Take 50,000 Units by mouth 3 (three) times a week.      No current facility-administered medications for this visit.     Musculoskeletal: Strength & Muscle Tone: N/A Gait & Station: N/A Patient leans: N/A  Psychiatric Specialty Exam: Review of Systems  There were no vitals taken for this visit.There is no height or weight on file to calculate BMI.  General Appearance: {Appearance:22683}  Eye Contact:  {B[facility] EYE CONTACT:22684}  Speech:  Clear and Coherent  Volume:  Normal  Mood:  {BHH MOOD:22306}  Affect:  {Affect (PAA):22687}  Thought Process:  Coherent  Orientation:  Full (Time, Place, and Person)  Thought Content: Logical   Suicidal Thoughts:  {ST/HT (PAA):22692}  Homicidal Thoughts:  {ST/HT (PAA):22692}  Memory:  Immediate;   Good  Judgement:  {Judgement (PAA):22694}  Insight:  {Insight (PAA):22695}  Psychomotor Activity:  Normal  Concentration:  Concentration: Good and Attention Span: Good  Recall:  Good  Fund of Knowledge: Good  Language: Good  Akathisia:  No  Handed:  Right  AIMS (if indicated): not done  Assets:   Communication Skills Desire for Improvement  ADL's:  Intact  Cognition: WNL  Sleep:  {BHH GOOD/FAIR/POOR:22877}   Screenings:   Assessment and Plan:  DELLANIRA DILLOW is a 44 y.o. year old female with a history of ADHD, depression,hashimoto's  thyroiditis,AIH/Hereditary pancreatitiss/p islet cell transplant,diabetes,s/p Roux-en- Y bypass surgery,left lower extremity bypass with a subsequent balloon angioplasty, who presents for follow up appointment for below.   1. Attention deficit hyperactivity disorder (ADHD), unspecified ADHD type She reports significant worsening in ADHD symptoms in the context of running out of Concerta.  We will reinitiate Concerta to target ADHD.  She is aware of the potential risks, which includes but not limited to palpitation, worsening in anxiety and insomnia.  She is advised to contact the office if she has any difficulty getting the medication from pharmacy.   2. Primary insomnia She reports good benefit from Xanax.  Noted that although it is preferable to avoid benzodiazepine, this is the only medication which has worked well for the patient after having tried several hypnotics.  Will continue current dose of Xanax for insomnia.  Discussed potential risk of dependence and drowsiness.    Plan  1.ContinueConcerta 27mg  daily 2.Continue Xanax 0.5-1.5 mg total at night  3. Next appointment: 11/3 at 9:81for 20 mins, video   Past trials of medication:sertraline, fluoxetine,Paxil, citalopram, lexapro, bupropion (voices), venlafaxine, duloxetine, trintellix, mirtazapine, trazodone, amitriptyline,doxepin (insomnia),lamotrigine,Geodon, olanzapine,quetiapine,Vraylar, xanax, clonazepam,temazepam,ritalin, trazodone, Ambien, Rozerem,Lunesta, Belsomra, benadryl,  The patient demonstrates the following risk factors for suicide: Chronic risk factors for suicide include:psychiatric disorder ofdepressionand medical illness pancreatitis. Acute  risk factorsfor suicide include: unemployment. Protective factorsfor this patient include: positive social support, coping skills and hope for the future. Considering these factors, the overall suicide risk at this point appears to below. Patientisappropriate for outpatient follow up.   3f, MD 04/16/2020, 3:15 PM

## 2020-04-21 NOTE — Progress Notes (Signed)
Virtual Visit via Video Note  I connected with Jessica [name] on 04/23/20 at  9:10 AM EDT by a video enabled telemedicine application and verified that I am speaking with the correct person using two identifiers.  Location: Patient: home Provider: office   I discussed the limitations of evaluation and management by telemedicine and the availability of in person appointments. The patient expressed understanding and agreed to proceed.   I discussed the assessment and treatment plan with the patient. The patient was provided an opportunity to ask questions and all were answered. The patient agreed with the plan and demonstrated an understanding of the instructions.   The patient was advised to call back or seek an in-person evaluation if the symptoms worsen or if the condition fails to improve as anticipated.  I provided 12 minutes of non-face-to-face time during this encounter.   Jessica Hottereina Laniece Hornbaker, MD    [facility] OP Progress Note  04/23/2020 9:43 AM Jessica [name]  MRN:  [id number]  Chief Complaint:  Chief Complaint    Follow-up; ADHD; Other     HPI:  This is a follow-up appointment for ADHD and insomnia.  She states that she has been feeling more anxious lately.  She notices that she starts to have palpitation, and she tends to focus on it more.  She is worried that what can happen in the future.  She cannot think of any stressors, stating that things has been quite decent.  She states that her mother's treatment for cancer is going well.  She also states that she has started to have difficulty in concentration, although concerta worked better in the past.  She is unable to sustain tasks, or do multitasking.  She tends to be forgetful.  She sleeps better with Xanax.  She is interested in trying a higher dose of Concerta.    Daily routine: household chores, watching TV, reading, walk to mail box, calls her mother every other day at least Marital status: married Number of children: 0    Visit Diagnosis:    ICD-10-CM   1. Insomnia, unspecified type  G47.00 ALPRAZolam (XANAX) 0.5 MG tablet  2. Attention deficit hyperactivity disorder (ADHD), unspecified ADHD type  F90.9     Past Psychiatric History: Please see initial evaluation for full details. I have reviewed the history. No updates at this time.     Past Medical History:  Past Medical History:  Diagnosis Date  . ADHD (attention deficit hyperactivity disorder)   . Anxiety   . Diabetes mellitus type I (HCC)   . Diverticulosis of colon without hemorrhage   . DM (diabetes mellitus) (HCC)    post pancreatectomy  . Feeding by G-tube (HCC)   . GERD (gastroesophageal reflux disease)   . Hashimoto's thyroiditis    euthyroid  . Hypercholesterolemia   . PONV (postoperative nausea and vomiting)     Past Surgical History:  Procedure Laterality Date  . ABDOMINAL AORTOGRAM W/LOWER EXTREMITY N/A 04/27/2017   Procedure: ABDOMINAL AORTOGRAM W/LOWER EXTREMITY;  Surgeon: Maeola Harmanain, Brandon Christopher, MD;  [facility];  Service: Cardiovascular;  Laterality: N/A;  . ABDOMINAL AORTOGRAM W/LOWER EXTREMITY Bilateral 06/04/2019   Procedure: ABDOMINAL AORTOGRAM W/LOWER EXTREMITY;  Surgeon: Maeola Harmanain, Brandon Christopher, MD;  [facility];  Service: Cardiovascular;  Laterality: Bilateral;  . ABDOMINAL AORTOGRAM W/LOWER EXTREMITY N/A 11/26/2019   Procedure: ABDOMINAL AORTOGRAM W/ Right LOWER EXTREMITY Runoff;  Surgeon: Maeola Harmanain, Brandon Christopher, MD;  [facility];  Service: Cardiovascular;  Laterality: N/A;  . ABDOMINAL HYSTERECTOMY    . BIOPSY N/A 05/15/2014   Procedure: GASTIC,  ASCENDING, AND DESCEDNING/SIGMOID  COLON BIOPSY;  Surgeon: Corbin Ade, MD;  Location: [facility];  Service: Endoscopy;  Laterality: N/A;  . CHOLECYSTECTOMY    . COLONOSCOPY WITH PROPOFOL N/A 05/15/2014   Dr. Jena Gauss (primary GI is Dr. Darrick Penna). Hemorrhoids, negative random colon biopsies. GI pathogen panel negative.  Diverticulosis.  [name] complete hysterectomy    . ESOPHAGOGASTRODUODENOSCOPY (EGD) WITH PROPOFOL N/A 05/15/2014   Dr. Jena Gauss (primary GI Dr. Darrick Penna), Billroth II, inflamed residual gastric mucosa, benign biopsies  . FEMORAL-POPLITEAL BYPASS GRAFT    . 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  [name] KNEE SURGERY Right    X 6-5 arthroscopies and open procedure 1 time- tighten patellar tendon and loose IT band  . 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  [name] PERIPHERAL VASCULAR INTERVENTION Right 06/04/2019   Procedure: PERIPHERAL VASCULAR INTERVENTION;  Surgeon: Maeola Harman, MD;  Location[facility] INVASIVE CV LAB;  Service: Cardiovascular;  Laterality: Right;  common iliac  . PERIPHERAL VASCULAR INTERVENTION Right 11/26/2019   Procedure: PERIPHERAL VASCULAR INTERVENTION;  Surgeon: Maeola Harman, MD;  Location: [facility] INVASIVE CV LAB;  Service: Cardiovascular;  Laterality: Right;  Iliac     Family Psychiatric History: Please see initial evaluation for full details. I have reviewed the history. No updates at this time.     Family History:  Family History  Problem Relation Age of Onset  . Pancreatic cancer Paternal Grandfather   . Cirrhosis Paternal Grandfather   . Pancreatitis Other        cousin  . Throat cancer Mother   . Depression Father   . Colon cancer Neg Hx     Social History:  Social History   Socioeconomic History  . Marital status: Married    Spouse name: Not on file  . Number of children: 0  . Years of education: Not on file  . Highest education level: Not on file  Occupational History  . Occupation: Associate Professor  Tobacco Use  . Smoking status: Light Tobacco Smoker    Packs/day: 0.25    Years: 20.00    Pack years: 5.00    Types: Cigarettes  . Smokeless tobacco: Never Used  . Tobacco comment: 1 cig daily  Vaping Use  . Vaping Use: Some days  Substance and Sexual Activity  . Alcohol use: No    Alcohol/week:  0.0 standard drinks  . Drug use: No  . Sexual activity: Yes    Birth control/protection: None  Other Topics Concern  . Not on file  Social History Narrative  . Not on file   Social Determinants of Health   Financial Resource Strain:   . Difficulty of Paying Living Expenses: Not on file  Food Insecurity:   . Worried About Programme researcher, broadcasting/film/video in the Last Year: Not on file  . Ran Out of Food in the Last Year: Not on file  Transportation Needs:   . Lack of Transportation (Medical): Not on file  . Lack of Transportation (Non-Medical): Not on file  Physical Activity:   . Days of Exercise per Week: Not on file  . Minutes of Exercise per Session: Not on file  Stress:   . Feeling of Stress : Not on file  Social Connections:   . Frequency of Communication with Friends and Family: Not on file  . Frequency of Social Gatherings with Friends and Family:  Not on file  . Attends Religious Services: Not on file  . Active Member of Clubs or Organizations: Not on file  . Attends Banker Meetings: Not on file  . Marital Status: Not on file    Allergies:  Allergies  Allergen Reactions  . Erythromycin Anaphylaxis, Hives and Nausea Only  . Levofloxacin Other (See Comments)    Burning during administration IV prior to surgery.Pt was told not to take it again.  . Niaspan [Niacin] Anaphylaxis  . Penicillins Anaphylaxis    Has patient had a PCN reaction causing immediate rash, facial/tongue/throat swelling, SOB or lightheadedness with hypotension: Yes Has patient had a PCN reaction causing severe rash involving mucus membranes or skin necrosis: No Has patient had a PCN reaction that required hospitalization: No Has patient had a PCN reaction occurring within the last 10 years: No If all of the above answers are "NO", then may proceed with Cephalosporin use.   . Shellfish Allergy Anaphylaxis  . Sulfa Antibiotics Anaphylaxis    "Blisters in my throat"  . Wellbutrin [Bupropion]  Other (See Comments)    Suicidal thoughts while on Lexapro  . Morphine Other (See Comments)    ineffective  . Strawberry Extract Hives  . Sulfamethoxazole Other (See Comments)    Blister in throat, sores in mouth  . Tape Other (See Comments)    Transpore Tape causes skin to peel;  and Nicoderm Adhesive Patch cause blisters   . Cefdinir     Unknown reaction   . Doxycycline Hives and Nausea Only    A 100mg  tablet formulation caused hives, nausea, vomiting.  Keflex [Cephalexin] Other (See Comments)    Hallucinations  . Statins Other (See Comments)    Muscle pain  . Vancomycin Hives and Nausea Only  . Zetia [Ezetimibe] Diarrhea  . 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 [START ON 05/12/2020] ALPRAZolam (XANAX) 0.5 MG tablet 1-1.5 mg at night as needed for anxiety/sleep 90 tablet 0  . aspirin EC 81 MG tablet Take 81 mg by mouth daily.    . clopidogrel (PLAVIX) 75 MG tablet Take 1 tablet (75 mg total) by mouth daily. 90 tablet 3  . EPIPEN 2-PAK 0.3 MG/0.3ML SOAJ injection Inject 0.3 mg into the skin daily as needed (allergic reaction).     . Evolocumab (REPATHA) 140 MG/ML SOSY Inject 140 mg into the skin every 14 (fourteen) days.    . insulin lispro (HUMALOG) 100 UNIT/ML injection continuous. Insulin pump    . levothyroxine (SYNTHROID) 75 MCG tablet Take 75 mcg by mouth daily before breakfast.     . methylphenidate (CONCERTA)  27 MG PO CR tablet Take 1 tablet (27 mg total) by mouth daily before breakfast. 30 tablet 0  . methylphenidate (CONCERTA) 27 MG PO CR tablet Take 1 tablet (27 mg total) by mouth daily before breakfast. 30 tablet 0  . methylphenidate (CONCERTA) 36 MG PO CR tablet  Take 1 tablet (36 mg total) by mouth daily before breakfast. 30 tablet 0  . [START ON 05/23/2020] methylphenidate (CONCERTA) 36 MG PO CR tablet Take 1 tablet (36 mg total) by mouth daily before breakfast. 30 tablet 0  . Multiple Vitamin (MULTIVITAMIN WITH MINERALS) TABS tablet Take 2 tablets by mouth daily.    [name] OVER THE COUNTER MEDICATION Take 2 tablets by mouth daily. D-[id number]-A otc supplement    . pantoprazole (PROTONIX) 40 MG tablet Take 80 mg by mouth at bedtime.     [name] PRESCRIPTION MEDICATION Take 2-3 capsules by mouth See admin instructions. BIOKACE * ONLY IF HAVING A MEAL - 3 caps with meals and 2 caps with snacks    . Vitamin D, Ergocalciferol, (DRISDOL) 1.25 MG (50000 UNIT) CAPS capsule Take 50,000 Units by mouth 3 (three) times a week.      No current facility-administered medications for this visit.     Musculoskeletal: Strength & Muscle Tone: N/A Gait & Station: N/A Patient leans: N/A  Psychiatric Specialty Exam: Review of Systems  Psychiatric/Behavioral: Positive for decreased concentration. Negative for agitation, behavioral problems, confusion, dysphoric mood, hallucinations, self-injury, sleep disturbance and suicidal ideas. The patient is nervous/anxious. The patient is not hyperactive.   All other systems reviewed and are negative.   There were no vitals taken for this visit.There is no height or weight on file to calculate BMI.  General Appearance: Fairly Groomed  Eye Contact:  Good  Speech:  Clear and Coherent  Volume:  Normal  Mood:  Anxious  Affect:  Appropriate, Congruent and euthymic  Thought Process:  Coherent  Orientation:  Full (Time, Place, and Person)  Thought Content: Logical   Suicidal Thoughts:   No  Homicidal Thoughts:  No  Memory:  Immediate;   Good  Judgement:  Good  Insight:  Good  Psychomotor Activity:  Normal  Concentration:  Concentration: Good and Attention Span: Good  Recall:  Good  Fund of Knowledge: Good  Language: Good  Akathisia:  No  Handed:  Right  AIMS (if indicated): not done  Assets:  Communication Skills Desire for Improvement  ADL's:  Intact  Cognition: WNL  Sleep:  Good   Screenings:   Assessment and Plan:  JAQUEL COOMER is a 44 y.o. year old female with a history of ADHD, depression,hashimoto's thyroiditis,AIH/Hereditary pancreatitiss/p islet cell transplant,diabetes,s/p Roux-en- Y bypass surgery,left lower extremity bypass with a subsequent balloon angioplasty, who presents for follow up appointment for below.   1. Attention deficit hyperactivity disorder (ADHD), unspecified ADHD type She reports worsening in inattention and racing thoughts since the last visit.  Will uptitrate Concerta to optimize treatment for ADHD.  Discussed potential risk, which includes but not limited to palpitation, worsening in anxiety and insomnia.   2. Insomnia, unspecified type She reports significant benefit from Xanax.  As well it is preferable to avoid benzodiazepine, the patient had no benefit from other hypnotics.  Will continue current dose to target insomnia.  Discussed risk of dependence and oversedation.   Plan I have reviewed and updated plans as below 1.Increase Concerta 36 mg daily 2.Continue Xanax 0.5-1.5 mg total at night  3.Next appointment:12/22 at 10:76for 20 mins, video   Past trials of medication:sertraline, fluoxetine,Paxil, citalopram, lexapro, bupropion (voices), venlafaxine, duloxetine, trintellix, mirtazapine, trazodone, amitriptyline,doxepin (insomnia),lamotrigine,Geodon, olanzapine,quetiapine,Vraylar, xanax, clonazepam,temazepam,ritalin, trazodone, Ambien, Rozerem,Lunesta, Belsomra, benadryl,  The patient  demonstrates the following risk factors for suicide: Chronic risk factors for suicide include:psychiatric disorder ofdepressionand medical illness pancreatitis. Acute risk factorsfor suicide include: unemployment.  Protective factorsfor this patient include: positive social support, coping skills and hope for the future. Considering these factors, the overall suicide risk at this point appears to below. Patientisappropriate for outpatient follow up.  Jessica Hotter, MD 04/23/2020, 9:43 AM

## 2020-04-23 ENCOUNTER — Telehealth (HOSPITAL_COMMUNITY): Payer: Medicare (Managed Care) | Admitting: Psychiatry

## 2020-04-23 ENCOUNTER — Telehealth (INDEPENDENT_AMBULATORY_CARE_PROVIDER_SITE_OTHER): Payer: Medicare (Managed Care) | Admitting: Psychiatry

## 2020-04-23 ENCOUNTER — Other Ambulatory Visit: Payer: Self-pay

## 2020-04-23 ENCOUNTER — Encounter: Payer: Self-pay | Admitting: Psychiatry

## 2020-04-23 DIAGNOSIS — G4700 Insomnia, unspecified: Secondary | ICD-10-CM

## 2020-04-23 DIAGNOSIS — F909 Attention-deficit hyperactivity disorder, unspecified type: Secondary | ICD-10-CM

## 2020-04-23 MED ORDER — ALPRAZOLAM 0.5 MG PO TABS: ORAL_TABLET | ORAL | 0 refills | Status: DC

## 2020-04-23 MED ORDER — METHYLPHENIDATE HCL ER (OSM) 36 MG PO TBCR
36.0000 mg | EXTENDED_RELEASE_TABLET | Freq: Every day | ORAL | 0 refills | Status: DC
Start: 2020-05-23 — End: 2020-07-10

## 2020-04-23 MED ORDER — METHYLPHENIDATE HCL ER (OSM) 36 MG PO TBCR
36.0000 mg | EXTENDED_RELEASE_TABLET | Freq: Every day | ORAL | 0 refills | Status: DC
Start: 2020-04-23 — End: 2020-07-10

## 2020-04-24 ENCOUNTER — Telehealth (HOSPITAL_COMMUNITY): Payer: Self-pay | Admitting: *Deleted

## 2020-04-24 NOTE — Telephone Encounter (Signed)
Called pharmacy and informed Victorino Dike with what provider stated and she verbalized understanding.

## 2020-04-24 NOTE — Telephone Encounter (Signed)
[facility] called stating they received script for pt Concerta yesterday 04-23-2020. Per pt pharmacy, patient just picked up the Concerta 27 mg on October 22-2021. Per pt pharmacy they just want to make sure they can go ahead and fill this medication (Concerta 36 mg) although pt just picked up the 27 mg Concerta. Victorino Dike [phone].

## 2020-04-24 NOTE — Telephone Encounter (Signed)
I am aware of this. Please advise the pharmacy to allow this refill as I would like her to try higher dose. Thanks

## 2020-05-01 ENCOUNTER — Telehealth (HOSPITAL_COMMUNITY): Payer: Medicare (Managed Care) | Admitting: Psychiatry

## 2020-05-28 ENCOUNTER — Telehealth: Payer: Self-pay

## 2020-05-28 NOTE — Telephone Encounter (Signed)
Left VM with patient - per Dr. Randie Heinz patient okay to hold Plavix prior to J-tube replacement

## 2020-06-03 ENCOUNTER — Telehealth: Payer: Self-pay | Admitting: Vascular Surgery

## 2020-06-03 ENCOUNTER — Telehealth: Payer: Self-pay | Admitting: *Deleted

## 2020-06-03 NOTE — Telephone Encounter (Signed)
Pt called c/o R LE tingling/pain and skin changes on foot. She saw PCP today with x-rays. PCP reassured her it was not gangrenous and discussed possible CT. Pt is wanting to be seen in our office, as she feels it is possibly vascular issue. Per APP, we have scheduled her exercise study for this week. MD has been made aware. Pt verbalized understanding of this appt and will call us if anything changes in the meantime.

## 2020-06-03 NOTE — Telephone Encounter (Signed)
Pt called to complain of rest pain in the right calf, new skin darkening (like a bruise) on toes and side of right foot. Foot is also tingling. She does not recall any trauma that would have caused a bruise. She has an appt with her PCP this morning. She will call back after that appt to discuss PCP findings and recommendations. Pt verbalized understanding.

## 2020-06-04 NOTE — Telephone Encounter (Signed)
Pt is scheduled for exercise study this week and is to keep her appt to f/u with MD next month. I tried to call pt to confirm this plan, no voicemail came on to leave message. She is aware of this weeks appt.

## 2020-06-05 NOTE — Progress Notes (Signed)
Virtual Visit via Video Note  I connected with Jessica [name] on 06/11/20 at 10:30 AM EST by a video enabled telemedicine application and verified that I am speaking with the correct person using two identifiers.  Location: Patient: car Provider: office Persons participated in the visit- patient, provider   I discussed the limitations of evaluation and management by telemedicine and the availability of in person appointments. The patient expressed understanding and agreed to proceed.   I discussed the assessment and treatment plan with the patient. The patient was provided an opportunity to ask questions and all were answered. The patient agreed with the plan and demonstrated an understanding of the instructions.   The patient was advised to call back or seek an in-person evaluation if the symptoms worsen or if the condition fails to improve as anticipated.  I provided 13 minutes of non-face-to-face time during this encounter.   Neysa Hottereina Penina Reisner, MD    [facility] MD/PA/NP OP Progress Note  06/11/2020 10:56 AM Jessica [name]  MRN:  [id number]  Chief Complaint:  Chief Complaint    ADHD; Medication Reaction     HPI:  This is a follow-up appointment for ADHD and insomnia.  She states that she is taking Concerta every other day as it makes her having palpitation and significant panic attacks.  Although she is able to focus better when she is on higher dose, she cannot tolerate to  these side effects.  She also struggles with focus when she does not take this medication.  She has difficulty with multitasking, completing tasks, has racing thoughts, and is forgetful.  She also states that her mother is in home hospice now.  She feels frustrated as her mother is sitting at the computer desk while she needs to get up.  She reports fair relationship with her mother.  She sleeps well when she takes Xanax.  She denies feeling anxious except the time she takes higher dose of Concerta.  She has fair appetite.  She denies alcohol use or drug use.   Daily routine:household chores, watching TV, reading, walk to mail box, calls her mother every other day at least Marital status:married Number of children:0  Visit Diagnosis:    ICD-10-CM   1. Attention deficit hyperactivity disorder (ADHD), unspecified ADHD type  F90.9   2. Insomnia, unspecified type  G47.00 ALPRAZolam (XANAX) 0.5 MG tablet    Past Psychiatric History: Please see initial evaluation for full details. I have reviewed the history. No updates at this time.     Past Medical History:  Past Medical History:  Diagnosis Date  . ADHD (attention deficit hyperactivity disorder)   . Anxiety   . Diabetes mellitus type I (HCC)   . Diverticulosis of colon without hemorrhage   . DM (diabetes mellitus) (HCC)    post pancreatectomy  . Feeding by G-tube (HCC)   . GERD (gastroesophageal reflux disease)   . Hashimoto's thyroiditis    euthyroid  . Hypercholesterolemia   . PONV (postoperative nausea and vomiting)     Past Surgical History:  Procedure Laterality Date  . ABDOMINAL AORTOGRAM W/LOWER EXTREMITY N/A 04/27/2017   Procedure: ABDOMINAL AORTOGRAM W/LOWER EXTREMITY;  Surgeon: Maeola Harmanain, Brandon Christopher, MD;  [facility];  Service: Cardiovascular;  Laterality: N/A;  . ABDOMINAL AORTOGRAM W/LOWER EXTREMITY Bilateral 06/04/2019   Procedure: ABDOMINAL AORTOGRAM W/LOWER EXTREMITY;  Surgeon: Maeola Harmanain, Brandon Christopher, MD;  [facility];  Service: Cardiovascular;  Laterality: Bilateral;  . ABDOMINAL AORTOGRAM W/LOWER EXTREMITY N/A 11/26/2019  Procedure: ABDOMINAL AORTOGRAM W/ Right LOWER EXTREMITY Runoff;  Surgeon: Maeola Harman, MD;  Location[facility] INVASIVE CV LAB;  Service: Cardiovascular;  Laterality: N/A;  . ABDOMINAL HYSTERECTOMY    . BIOPSY N/A 05/15/2014   Procedure: GASTIC,  ASCENDING, AND DESCEDNING/SIGMOID  COLON BIOPSY;  Surgeon: Corbin Ade, MD;  Location: [facility];  Service: Endoscopy;   Laterality: N/A;  . CHOLECYSTECTOMY    . COLONOSCOPY WITH PROPOFOL N/A 05/15/2014   Dr. Jena Gauss (primary GI is Dr. Darrick Penna). Hemorrhoids, negative random colon biopsies. GI pathogen panel negative. Diverticulosis.  [name] complete hysterectomy    . ESOPHAGOGASTRODUODENOSCOPY (EGD) WITH PROPOFOL N/A 05/15/2014   Dr. Jena Gauss (primary GI Dr. Darrick Penna), Billroth II, inflamed residual gastric mucosa, benign biopsies  . FEMORAL-POPLITEAL BYPASS GRAFT    . 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  [name] KNEE SURGERY Right    X 6-5 arthroscopies and open procedure 1 time- tighten patellar tendon and loose IT band  . 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  [name] PERIPHERAL VASCULAR INTERVENTION Right 06/04/2019   Procedure: PERIPHERAL VASCULAR INTERVENTION;  Surgeon: Maeola Harman, MD;  Location: [facility] INVASIVE CV LAB;  Service: Cardiovascular;  Laterality: Right;  common iliac  . PERIPHERAL VASCULAR INTERVENTION Right 11/26/2019   Procedure: PERIPHERAL VASCULAR INTERVENTION;  Surgeon: Maeola Harman, MD;  Location: [facility] INVASIVE CV LAB;  Service: Cardiovascular;  Laterality: Right;  Iliac     Family Psychiatric History: Please see initial evaluation for full details. I have reviewed the history. No updates at this time.     Family History:  Family History  Problem Relation Age of Onset  . Pancreatic cancer Paternal Grandfather   . Cirrhosis Paternal Grandfather   . Pancreatitis Other        cousin  . Throat cancer Mother   . Depression Father   . Colon cancer Neg Hx     Social History:  Social History   Socioeconomic History  . Marital status: Married    Spouse name: Not on file  . Number of children: 0  . Years of education: Not on file  . Highest education level: Not on file  Occupational History  . Occupation: Associate Professor  Tobacco Use  . Smoking status: Former Smoker    Packs/day: 0.25    Years: 20.00    Pack years: 5.00     Types: Cigarettes  . Smokeless tobacco: Never Used  . Tobacco comment: 1 cig daily  Vaping Use  . Vaping Use: Some days  Substance and Sexual Activity  . Alcohol use: No    Alcohol/week: 0.0 standard drinks  . Drug use: No  . Sexual activity: Yes    Birth control/protection: None  Other Topics Concern  . Not on file  Social History Narrative  . Not on file   Social Determinants of Health   Financial Resource Strain: Not on file  Food Insecurity: Not on file  Transportation Needs: Not on file  Physical Activity: Not on file  Stress: Not on file  Social Connections: Not on file    Allergies:  Allergies  Allergen Reactions  . Erythromycin Anaphylaxis, Hives and Nausea Only  . Levofloxacin Other (See Comments)    Burning during administration IV prior to surgery.Pt was told not to take it again.  . Niaspan [Niacin] Anaphylaxis  . Penicillins Anaphylaxis    Has patient had a PCN reaction causing immediate rash, facial/tongue/throat swelling, SOB or lightheadedness with hypotension: Yes Has patient had  a PCN reaction causing severe rash involving mucus membranes or skin necrosis: No Has patient had a PCN reaction that required hospitalization: No Has patient had a PCN reaction occurring within the last 10 years: No If all of the above answers are "NO", then may proceed with Cephalosporin use.   . Shellfish Allergy Anaphylaxis  . Sulfa Antibiotics Anaphylaxis    "Blisters in my throat"  . Wellbutrin [Bupropion] Other (See Comments)    Suicidal thoughts while on Lexapro  . Morphine Other (See Comments)    ineffective  . Strawberry Extract Hives  . Sulfamethoxazole Other (See Comments)    Blister in throat, sores in mouth  . Tape Other (See Comments)    Transpore Tape causes skin to peel;  and Nicoderm Adhesive Patch cause blisters   . Cefdinir     Unknown reaction   . Doxycycline Hives and Nausea Only    A 100mg  tablet formulation caused hives, nausea, vomiting.   Keflex [Cephalexin] Other (See Comments)    Hallucinations  . Statins Other (See Comments)    Muscle pain  . Vancomycin Hives and Nausea Only  . Zetia [Ezetimibe] Diarrhea  . 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 ALPRAZolam (XANAX) 0.5 MG tablet 1-1.5 mg at night as needed for anxiety/sleep 90 tablet 0  . aspirin EC 81 MG tablet Take 81 mg by mouth daily.    . clopidogrel (PLAVIX) 75 MG tablet Take 1 tablet (75 mg total) by mouth daily. 90 tablet 3  . EPIPEN 2-PAK 0.3 MG/0.3ML SOAJ injection Inject 0.3 mg into the skin daily as needed (allergic reaction).     . Evolocumab 140 MG/ML SOSY Inject 140 mg into the skin every 14 (fourteen) days.    . insulin lispro (HUMALOG) 100 UNIT/ML injection continuous. Insulin pump    . levothyroxine (SYNTHROID) 75 MCG tablet Take 75 mcg by mouth daily before breakfast.     . methylphenidate (CONCERTA) 27 MG PO CR tablet Take 1 tablet (27 mg total) by mouth daily before breakfast. 30 tablet 0  . methylphenidate (CONCERTA) 27 MG PO CR tablet Take 1 tablet (27 mg total) by mouth daily before breakfast. 30 tablet 0  . methylphenidate (CONCERTA) 36 MG PO CR tablet Take 1 tablet (36 mg total) by mouth daily before breakfast. 30 tablet 0  . methylphenidate (CONCERTA) 36 MG PO CR tablet Take 1 tablet (36 mg total) by mouth daily before breakfast. 30 tablet 0  . methylphenidate (RITALIN) 5 MG tablet Take 1  tablet (5 mg total) by mouth daily at 12 noon. 30 tablet 0  . Multiple Vitamin (MULTIVITAMIN WITH MINERALS) TABS tablet Take 2 tablets by mouth daily.    03/31/2014 OVER THE COUNTER MEDICATION Take 2 tablets by mouth daily. D-E-K-A otc supplement    . pantoprazole (PROTONIX) 40 MG tablet Take 80 mg by mouth at bedtime.     [name] PRESCRIPTION MEDICATION Take  2-3 capsules by mouth See admin instructions. BIOKACE * ONLY IF HAVING A MEAL - 3 caps with meals and 2 caps with snacks    . Vitamin D, Ergocalciferol, (DRISDOL) 1.25 MG (50000 UNIT) CAPS capsule Take 50,000 Units by mouth 3 (three) times a week.      No current facility-administered medications for this visit.     Musculoskeletal: Strength & Muscle Tone: N/A Gait & Station: N/A Patient leans: N/A  Psychiatric Specialty Exam: Review of Systems  Psychiatric/Behavioral: Positive for decreased concentration. Negative for agitation, behavioral problems, confusion, dysphoric mood, hallucinations, self-injury, sleep disturbance and suicidal ideas. The patient is nervous/anxious. The patient is not hyperactive.   All other systems reviewed and are negative.   There were no vitals taken for this visit.There is no height or weight on file to calculate BMI.  General Appearance: Fairly Groomed  Eye Contact:  Good  Speech:  Clear and Coherent  Volume:  Normal  Mood:  Anxious  Affect:  Appropriate, Congruent and Full Range  Thought Process:  Coherent  Orientation:  Full (Time, Place, and Person)  Thought Content: Logical   Suicidal Thoughts:  No  Homicidal Thoughts:  No  Memory:  Immediate;   Good  Judgement:  Good  Insight:  Good  Psychomotor Activity:  Normal  Concentration:  Concentration: Good and Attention Span: Good  Recall:  Good  Fund of Knowledge: Good  Language: Good  Akathisia:  No  Handed:  Right  AIMS (if indicated): not done  Assets:  Communication Skills Desire for Improvement  ADL's:  Intact  Cognition: WNL  Sleep:  Fair    Screenings:   Assessment and Plan:  Jessica [name] is a 44 y.o. year old female with a history of ADHD, depression,hashimoto's thyroiditis,AIH/Hereditary pancreatitiss/p islet cell transplant,diabetes,s/p Roux-en- Y bypass surgery,left lower extremity bypass with a subsequent balloon angioplasty, who presents for follow up appointment for below.   1. Attention deficit hyperactivity disorder (ADHD), unspecified ADHD type She had an adverse reaction of palpitation since up titration of Concerta.  Will taper down the dose, and add Ritalin at later time to optimize treatment for ADHD.  Discussed potential risks, which includes but not limited to palpitation, worsening in anxiety and insomnia.   2. Insomnia, unspecified type She reports good benefit from Xanax for insomnia.  Although it is preferable to avoid benzodiazepine, patient had no benefit from other hypnotics.  Will continue current dose to target insomnia.  Discussed risk of dependence and oversedation.   Plan 1. Decrease Concerta 27 mg daily  2. Start Ritalin 5 mg at noon  3. Continue Xanax 0.5-1.5 mg total at night  4.Next appointment:1/20 at 8:63for 30 mins, video   Past trials of medication:sertraline, fluoxetine,Paxil, citalopram, lexapro, bupropion (voices), venlafaxine, duloxetine, trintellix, mirtazapine, trazodone, amitriptyline,doxepin (insomnia),lamotrigine,Geodon, olanzapine,quetiapine,Vraylar, xanax, clonazepam,temazepam,ritalin, trazodone, Ambien, Rozerem,Lunesta, Belsomra, benadryl,  The patient demonstrates the following risk factors for suicide: Chronic risk factors for suicide include:psychiatric disorder ofdepressionand medical illness pancreatitis. Acute risk factorsfor suicide include: unemployment. Protective factorsfor this patient include: positive social support, coping skills and hope for the future. Considering these factors, the overall suicide risk at this point appears to  below. Patientisappropriate for outpatient follow up.  Neysa Hotter, MD 06/11/2020, 10:56 AM

## 2020-06-06 ENCOUNTER — Other Ambulatory Visit (HOSPITAL_COMMUNITY): Payer: Self-pay | Admitting: Vascular Surgery

## 2020-06-06 ENCOUNTER — Encounter: Payer: Self-pay | Admitting: Vascular Surgery

## 2020-06-06 ENCOUNTER — Ambulatory Visit (HOSPITAL_COMMUNITY)
Admission: RE | Admit: 2020-06-06 | Discharge: 2020-06-06 | Disposition: A | Discharge status: Home / Self Care | Payer: Medicaid - Out of State | Source: Ambulatory Visit | Attending: Vascular Surgery | Admitting: Vascular Surgery

## 2020-06-06 ENCOUNTER — Ambulatory Visit (INDEPENDENT_AMBULATORY_CARE_PROVIDER_SITE_OTHER): Payer: 59 | Admitting: Vascular Surgery

## 2020-06-06 ENCOUNTER — Other Ambulatory Visit: Payer: Self-pay

## 2020-06-06 VITALS — BP 112/76 | HR 95 | Temp 98.3°F | Resp 20 | Ht 66.0 in | Wt 147.0 lb

## 2020-06-06 DIAGNOSIS — Z87891 Personal history of nicotine dependence: Secondary | ICD-10-CM

## 2020-06-06 DIAGNOSIS — I779 Disorder of arteries and arterioles, unspecified: Secondary | ICD-10-CM | POA: Diagnosis not present

## 2020-06-06 DIAGNOSIS — R52 Pain, unspecified: Secondary | ICD-10-CM | POA: Diagnosis present

## 2020-06-06 DIAGNOSIS — IMO0002 Reserved for concepts with insufficient information to code with codable children: Secondary | ICD-10-CM

## 2020-06-06 DIAGNOSIS — E1065 Type 1 diabetes mellitus with hyperglycemia: Secondary | ICD-10-CM

## 2020-06-06 NOTE — Progress Notes (Signed)
Patient ID: Jessica [name], female   DOB: Mar 30, 1976, 44 y.o.   MRN: [id number]  Reason for Consult: Follow-up   Referred by Virl Diamond, MD  Subjective:     HPI:  Jessica [name] is a 44 y.o. female previous history of right common iliac artery stenting on 2 occasions previous left femoropopliteal bypass performed in [employer].  I have also intervened on the femoropopliteal bypass and the remote past.  She has a history of a total pancreatectomy with subsequent Roux-en-Y bypass and gastrojejunostomy tube.  Recently she had stopped her Plavix to have her tube exchanged.  She now follows up for skin changes on her right lateral foot with pain.  She does have pain with walking does not have frank claudication does not have frank tissue loss or ulceration.  She did quit smoking 47 days ago.  Past Medical History:  Diagnosis Date  . ADHD (attention deficit hyperactivity disorder)   . Anxiety   . Diabetes mellitus type I (HCC)   . Diverticulosis of colon without hemorrhage   . DM (diabetes mellitus) (HCC)    post pancreatectomy  . Feeding by G-tube (HCC)   . GERD (gastroesophageal reflux disease)   . Hashimoto's thyroiditis    euthyroid  . Hypercholesterolemia   . PONV (postoperative nausea and vomiting)    Family History  Problem Relation Age of Onset  . Pancreatic cancer Paternal Grandfather   . Cirrhosis Paternal Grandfather   . Pancreatitis Other        cousin  . Throat cancer Mother   . Depression Father   . Colon cancer Neg Hx    Past Surgical History:  Procedure Laterality Date  . ABDOMINAL AORTOGRAM W/LOWER EXTREMITY N/A 04/27/2017   Procedure: ABDOMINAL AORTOGRAM W/LOWER EXTREMITY;  Surgeon: Maeola Harman, MD;  Location: [facility] INVASIVE CV LAB;  Service: Cardiovascular;  Laterality: N/A;  . ABDOMINAL AORTOGRAM W/LOWER EXTREMITY Bilateral 06/04/2019   Procedure: ABDOMINAL AORTOGRAM W/LOWER EXTREMITY;  Surgeon: Maeola Harman, MD;  Location: [facility]  INVASIVE CV LAB;  Service: Cardiovascular;  Laterality: Bilateral;  . ABDOMINAL AORTOGRAM W/LOWER EXTREMITY N/A 11/26/2019   Procedure: ABDOMINAL AORTOGRAM W/ Right LOWER EXTREMITY Runoff;  Surgeon: Maeola Harman, MD;  Location: [facility] INVASIVE CV LAB;  Service: Cardiovascular;  Laterality: N/A;  . ABDOMINAL HYSTERECTOMY    . BIOPSY N/A 05/15/2014   Procedure: GASTIC,  ASCENDING, AND DESCEDNING/SIGMOID  COLON BIOPSY;  Surgeon: Corbin Ade, MD;  Location: [facility];  Service: Endoscopy;  Laterality: N/A;  . CHOLECYSTECTOMY    . COLONOSCOPY WITH PROPOFOL N/A 05/15/2014   Dr. Jena Gauss (primary GI is Dr. Darrick Penna). Hemorrhoids, negative random colon biopsies. GI pathogen panel negative. Diverticulosis.  [name] complete hysterectomy    . ESOPHAGOGASTRODUODENOSCOPY (EGD) WITH PROPOFOL N/A 05/15/2014   Dr. Jena Gauss (primary GI Dr. Darrick Penna), Billroth II, inflamed residual gastric mucosa, benign biopsies  . FEMORAL-POPLITEAL BYPASS GRAFT    . 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  [name] KNEE SURGERY Right    X 6-5 arthroscopies and open procedure 1 time- tighten patellar tendon and loose IT band  . 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  [name] PERIPHERAL VASCULAR INTERVENTION Right 06/04/2019   Procedure: PERIPHERAL VASCULAR INTERVENTION;  Surgeon: Maeola Harman, MD;  Location[facility] INVASIVE CV LAB;  Service: Cardiovascular;  Laterality: Right;  common iliac  . PERIPHERAL VASCULAR INTERVENTION Right 11/26/2019   Procedure: PERIPHERAL VASCULAR INTERVENTION;  Surgeon: Maeola Harman, MD;  Location: [facility] INVASIVE  CV LAB;  Service: Cardiovascular;  Laterality: Right;  Iliac     Short Social History:  Social History   Tobacco Use  . Smoking status: Light Tobacco Smoker    Packs/day: 0.25    Years: 20.00    Pack years: 5.00    Types: Cigarettes  . Smokeless tobacco: Never Used  . Tobacco comment: 1 cig daily  Substance Use Topics  . Alcohol use:  No    Alcohol/week: 0.0 standard drinks    Allergies  Allergen Reactions  . Erythromycin Anaphylaxis, Hives and Nausea Only  . Levofloxacin Other (See Comments)    Burning during administration IV prior to surgery.Pt was told not to take it again.  . Niaspan [Niacin] Anaphylaxis  . Penicillins Anaphylaxis    Has patient had a PCN reaction causing immediate rash, facial/tongue/throat swelling, SOB or lightheadedness with hypotension: Yes Has patient had a PCN reaction causing severe rash involving mucus membranes or skin necrosis: No Has patient had a PCN reaction that required hospitalization: No Has patient had a PCN reaction occurring within the last 10 years: No If all of the above answers are "NO", then may proceed with Cephalosporin use.   . Shellfish Allergy Anaphylaxis  . Sulfa Antibiotics Anaphylaxis    "Blisters in my throat"  . Wellbutrin [Bupropion] Other (See Comments)    Suicidal thoughts while on Lexapro  . Morphine Other (See Comments)    ineffective  . Strawberry Extract Hives  . Sulfamethoxazole Other (See Comments)    Blister in throat, sores in mouth  . Tape Other (See Comments)    Transpore Tape causes skin to peel;  and Nicoderm Adhesive Patch cause blisters   . Cefdinir     Unknown reaction   . Doxycycline Hives and Nausea Only    A 100mg  tablet formulation caused hives, nausea, vomiting.  Keflex [Cephalexin] Other (See Comments)    Hallucinations  . Statins Other (See Comments)    Muscle pain  . Vancomycin Hives and Nausea Only  . Zetia [Ezetimibe] Diarrhea  . 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 ALPRAZolam (XANAX) 0.5 MG tablet 1-1.5 mg at night as needed for anxiety/sleep 90 tablet 0  . aspirin EC 81 MG tablet Take 81 mg by mouth daily.    . clopidogrel (PLAVIX) 75 MG tablet Take 1 tablet  (75 mg total) by mouth daily. 90 tablet 3  . EPIPEN 2-PAK 0.3 MG/0.3ML SOAJ injection Inject 0.3 mg into the skin daily as needed (allergic reaction).     . Evolocumab 140 MG/ML SOSY Inject 140 mg into the skin every 14 (fourteen) days.    . insulin lispro (HUMALOG) 100 UNIT/ML injection continuous. Insulin pump    . levothyroxine (SYNTHROID) 75 MCG tablet Take 75 mcg by mouth daily before breakfast.     . methylphenidate (CONCERTA) 36 MG PO CR tablet Take 1 tablet (36 mg total) by mouth daily before breakfast. 30 tablet 0  . Multiple Vitamin (MULTIVITAMIN WITH MINERALS) TABS tablet Take 2 tablets by mouth daily.    [name] OVER THE COUNTER MEDICATION Take 2 tablets by mouth daily. D-E-K-A otc supplement    . pantoprazole (PROTONIX) 40 MG tablet Take 80 mg by mouth at bedtime.     [name] PRESCRIPTION MEDICATION Take 2-3 capsules by mouth See admin instructions. BIOKACE * ONLY IF HAVING A  MEAL - 3 caps with meals and 2 caps with snacks    . Vitamin D, Ergocalciferol, (DRISDOL) 1.25 MG (50000 UNIT) CAPS capsule Take 50,000 Units by mouth 3 (three) times a week.     . methylphenidate (CONCERTA) 27 MG PO CR tablet Take 1 tablet (27 mg total) by mouth daily before breakfast. 30 tablet 0  . methylphenidate (CONCERTA) 27 MG PO CR tablet Take 1 tablet (27 mg total) by mouth daily before breakfast. 30 tablet 0  . methylphenidate (CONCERTA) 36 MG PO CR tablet Take 1 tablet (36 mg total) by mouth daily before breakfast. 30 tablet 0   No current facility-administered medications for this visit.    Review of Systems  Constitutional:  Constitutional negative. HENT: HENT negative.  Eyes: Eyes negative.  Respiratory: Respiratory negative.  Cardiovascular: Positive for claudication.  GI: Gastrointestinal negative.  Musculoskeletal: Positive for gait problem, leg pain and joint pain.  Skin: Positive for wound.  Hematologic: Hematologic/lymphatic negative.  Psychiatric: Psychiatric negative.        Objective:   Objective  Vitals:   06/06/20 1336  BP: 112/76  Pulse: 95  Resp: 20  Temp: 98.3 F (36.8 C)  SpO2: 96%    Physical Exam Constitutional:      Appearance: Normal appearance.  HENT:     Head: Normocephalic.     Nose:     Comments: Wearing a mask Eyes:     Pupils: Pupils are equal, round, and reactive to light.  Cardiovascular:     Pulses:          Femoral pulses are 2+ on the right side and 2+ on the left side.      Dorsalis pedis pulses are 2+ on the right side and 2+ on the left side.       Posterior tibial pulses are 2+ on the left side.  Pulmonary:     Effort: Pulmonary effort is normal.  Abdominal:     General: Abdomen is flat.     Palpations: Abdomen is soft. There is no mass.     Comments: G-tube in place  Musculoskeletal:        General: Normal range of motion.     Cervical back: Normal range of motion and neck supple.     Right lower leg: No edema.     Left lower leg: No edema.  Skin:    General: Skin is warm.     Capillary Refill: Capillary refill takes less than 2 seconds.     Comments: She has some superficial skin changes of her right lateral foot  Neurological:     General: No focal deficit present.     Mental Status: She is alert.  Psychiatric:        Mood and Affect: Mood normal.        Behavior: Behavior normal.        Thought Content: Thought content normal.        Judgment: Judgment normal.     Data: I independently interpreted her ABIs to be 1 bilaterally and biphasic.  Right toe pressure 134 and left 106     Assessment/Plan:     44 year old female with history of bilateral common iliac artery stenting.  She does have a previous history of pancreatectomy and a G-tube in place.  She quit smoking 47 days ago I congratulated her on this.  She does have palpable pedal pulses today to suggest that her issues are nonvascular.  Hopefully her superficial skin changes  can heal without any intervention.  She will follow-up with me in 6 months with  repeat ABIs.     Maeola HarmanBrandon Christopher Jery Hollern MD [facility]

## 2020-06-10 ENCOUNTER — Other Ambulatory Visit: Payer: Self-pay

## 2020-06-10 DIAGNOSIS — I779 Disorder of arteries and arterioles, unspecified: Secondary | ICD-10-CM

## 2020-06-11 ENCOUNTER — Telehealth (INDEPENDENT_AMBULATORY_CARE_PROVIDER_SITE_OTHER): Payer: Medicare (Managed Care) | Admitting: Psychiatry

## 2020-06-11 ENCOUNTER — Encounter: Payer: Self-pay | Admitting: Psychiatry

## 2020-06-11 ENCOUNTER — Other Ambulatory Visit: Payer: Self-pay

## 2020-06-11 DIAGNOSIS — G4700 Insomnia, unspecified: Secondary | ICD-10-CM

## 2020-06-11 DIAGNOSIS — F909 Attention-deficit hyperactivity disorder, unspecified type: Secondary | ICD-10-CM

## 2020-06-11 MED ORDER — ALPRAZOLAM 0.5 MG PO TABS: ORAL_TABLET | ORAL | 0 refills | Status: DC

## 2020-06-11 MED ORDER — METHYLPHENIDATE HCL 5 MG PO TABS: 5.0000 mg | ORAL_TABLET | Freq: Every day | ORAL | 0 refills | Status: DC

## 2020-06-11 MED ORDER — METHYLPHENIDATE HCL ER (OSM) 27 MG PO TBCR: 27.0000 mg | EXTENDED_RELEASE_TABLET | Freq: Every day | ORAL | 0 refills | Status: DC

## 2020-06-11 NOTE — Patient Instructions (Signed)
1. Decrease Concerta 27 mg daily  2. Start Ritalin 5 mg at noon  3. Continue Xanax 0.5-1.5 mg total at night  4.Next appointment:1/20 at 8:40

## 2020-06-27 ENCOUNTER — Ambulatory Visit: Payer: 59 | Admitting: Vascular Surgery

## 2020-06-27 ENCOUNTER — Encounter (HOSPITAL_COMMUNITY): Payer: 59

## 2020-07-06 DIAGNOSIS — M5134 Other intervertebral disc degeneration, thoracic region: Secondary | ICD-10-CM | POA: Insufficient documentation

## 2020-07-06 DIAGNOSIS — M545 Low back pain, unspecified: Secondary | ICD-10-CM | POA: Insufficient documentation

## 2020-07-07 NOTE — Progress Notes (Signed)
Virtual Visit via Video Note  I connected with Jessica [name] on 07/10/20 at  8:40 AM EST by a video enabled telemedicine application and verified that I am speaking with the correct person using two identifiers.  Location: Patient: home Provider: office Persons participated in the visit- patient, provider   I discussed the limitations of evaluation and management by telemedicine and the availability of in person appointments. The patient expressed understanding and agreed to proceed.   I discussed the assessment and treatment plan with the patient. The patient was provided an opportunity to ask questions and all were answered. The patient agreed with the plan and demonstrated an understanding of the instructions.   The patient was advised to call back or seek an in-person evaluation if the symptoms worsen or if the condition fails to improve as anticipated.  I provided 15 minutes of non-face-to-face time during this encounter.   Jessica [name] Jessica Koroma, MD    [facility] MD/PA/NP OP Progress Note  07/10/2020 9:02 AM Jessica FudgeRebecca L Rappaport  MRN:  [id number]  Chief Complaint:  Chief Complaint    ADHD; Follow-up     HPI:  This is a follow-up appointment for ADHD and insomnia.  She states that she has noticed no difference since starting Ritalin.  Although her panic attacks/palpitation has improved since lower dose of Concerta, she has been struggling with inattention all day.  It is difficult for her to do multitasking.  She is easily distracted, and unable to complete any tasks.  She tends to misplace things.  Although she does enjoy going to play pools with her husband, she is unable to do well due to difficulty in concentration.  She reports good relationship with her mother.  Although her mother started to have a seizure, she thinks she has come to terms with it.  She also reports better relationship, stating that her mother now values her opinion, which she did not do in the past.  She sleeps well.  She has  been able to limit the use of Xanax.  She denies feeling depressed.  She feels less anxious.  She denies SI.  She denies alcohol use or drug use.   Daily routine:household chores, watching TV,reading,walk to mail box, calls her mother every other day at least Marital status:married Number of children:0  Visit Diagnosis:    ICD-10-CM   1. Attention deficit hyperactivity disorder (ADHD), unspecified ADHD type  F90.9   2. Insomnia, unspecified type  G47.00     Past Psychiatric History: Please see initial evaluation for full details. I have reviewed the history. No updates at this time.     Past Medical History:  Past Medical History:  Diagnosis Date  . ADHD (attention deficit hyperactivity disorder)   . Anxiety   . Diabetes mellitus type I (HCC)   . Diverticulosis of colon without hemorrhage   . DM (diabetes mellitus) (HCC)    post pancreatectomy  . Feeding by G-tube (HCC)   . GERD (gastroesophageal reflux disease)   . Hashimoto's thyroiditis    euthyroid  . Hypercholesterolemia   . PONV (postoperative nausea and vomiting)     Past Surgical History:  Procedure Laterality Date  . ABDOMINAL AORTOGRAM W/LOWER EXTREMITY N/A 04/27/2017   Procedure: ABDOMINAL AORTOGRAM W/LOWER EXTREMITY;  Surgeon: Maeola Harmanain, Brandon Christopher, MD;  [facility];  Service: Cardiovascular;  Laterality: N/A;  . ABDOMINAL AORTOGRAM W/LOWER EXTREMITY Bilateral 06/04/2019   Procedure: ABDOMINAL AORTOGRAM W/LOWER EXTREMITY;  Surgeon: Maeola Harmanain, Brandon Christopher, MD;  [facility];  Service: Cardiovascular;  Laterality: Bilateral;  . ABDOMINAL AORTOGRAM W/LOWER EXTREMITY N/A 11/26/2019   Procedure: ABDOMINAL AORTOGRAM W/ Right LOWER EXTREMITY Runoff;  Surgeon: Maeola Harman, MD;  Location: [facility] INVASIVE CV LAB;  Service: Cardiovascular;  Laterality: N/A;  . ABDOMINAL HYSTERECTOMY    . BIOPSY N/A 05/15/2014   Procedure: GASTIC,  ASCENDING, AND DESCEDNING/SIGMOID  COLON  BIOPSY;  Surgeon: Corbin Ade, MD;  Location: [facility];  Service: Endoscopy;  Laterality: N/A;  . CHOLECYSTECTOMY    . COLONOSCOPY WITH PROPOFOL N/A 05/15/2014   Dr. Jena Gauss (primary GI is Dr. Darrick Penna). Hemorrhoids, negative random colon biopsies. GI pathogen panel negative. Diverticulosis.  [name] complete hysterectomy    . ESOPHAGOGASTRODUODENOSCOPY (EGD) WITH PROPOFOL N/A 05/15/2014   Dr. Jena Gauss (primary GI Dr. Darrick Penna), Billroth II, inflamed residual gastric mucosa, benign biopsies  . FEMORAL-POPLITEAL BYPASS GRAFT    . 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  [name] KNEE SURGERY Right    X 6-5 arthroscopies and open procedure 1 time- tighten patellar tendon and loose IT band  . 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  [name] PERIPHERAL VASCULAR INTERVENTION Right 06/04/2019   Procedure: PERIPHERAL VASCULAR INTERVENTION;  Surgeon: Maeola Harman, MD;  Location[facility] INVASIVE CV LAB;  Service: Cardiovascular;  Laterality: Right;  common iliac  . PERIPHERAL VASCULAR INTERVENTION Right 11/26/2019   Procedure: PERIPHERAL VASCULAR INTERVENTION;  Surgeon: Maeola Harman, MD;  Location: [facility] INVASIVE CV LAB;  Service: Cardiovascular;  Laterality: Right;  Iliac     Family Psychiatric History: Please see initial evaluation for full details. I have reviewed the history. No updates at this time.     Family History:  Family History  Problem Relation Age of Onset  . Pancreatic cancer Paternal Grandfather   . Cirrhosis Paternal Grandfather   . Pancreatitis Other        cousin  . Throat cancer Mother   . Depression Father   . Colon cancer Neg Hx     Social History:  Social History   Socioeconomic History  . Marital status: Married    Spouse name: Not on file  . Number of children: 0  . Years of education: Not on file  . Highest education level: Not on file  Occupational History  . Occupation: Associate Professor  Tobacco Use  . Smoking status:  Former Smoker    Packs/day: 0.25    Years: 20.00    Pack years: 5.00    Types: Cigarettes  . Smokeless tobacco: Never Used  . Tobacco comment: 1 cig daily  Vaping Use  . Vaping Use: Some days  Substance and Sexual Activity  . Alcohol use: No    Alcohol/week: 0.0 standard drinks  . Drug use: No  . Sexual activity: Yes    Birth control/protection: None  Other Topics Concern  . Not on file  Social History Narrative  . Not on file   Social Determinants of Health   Financial Resource Strain: Not on file  Food Insecurity: Not on file  Transportation Needs: Not on file  Physical Activity: Not on file  Stress: Not on file  Social Connections: Not on file    Allergies:  Allergies  Allergen Reactions  . Erythromycin Anaphylaxis, Hives and Nausea Only  . Levofloxacin Other (See Comments)    Burning during administration IV prior to surgery.Pt was told not to take it again.  . Niaspan [Niacin] Anaphylaxis  . Penicillins Anaphylaxis    Has  patient had a PCN reaction causing immediate rash, facial/tongue/throat swelling, SOB or lightheadedness with hypotension: Yes Has patient had a PCN reaction causing severe rash involving mucus membranes or skin necrosis: No Has patient had a PCN reaction that required hospitalization: No Has patient had a PCN reaction occurring within the last 10 years: No If all of the above answers are "NO", then may proceed with Cephalosporin use.   . Shellfish Allergy Anaphylaxis  . Sulfa Antibiotics Anaphylaxis    "Blisters in my throat"  . Wellbutrin [Bupropion] Other (See Comments)    Suicidal thoughts while on Lexapro  . Morphine Other (See Comments)    ineffective  . Strawberry Extract Hives  . Sulfamethoxazole Other (See Comments)    Blister in throat, sores in mouth  . Tape Other (See Comments)    Transpore Tape causes skin to peel;  and Nicoderm Adhesive Patch cause blisters   . Cefdinir     Unknown reaction   . Doxycycline Hives and  Nausea Only    A 100mg  tablet formulation caused hives, nausea, vomiting.  Keflex [Cephalexin] Other (See Comments)    Hallucinations  . Statins Other (See Comments)    Muscle pain  . Vancomycin Hives and Nausea Only  . Zetia [Ezetimibe] Diarrhea  . 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 lisdexamfetamine (VYVANSE) 20 MG capsule Take 1 capsule (20 mg total) by mouth daily. 30 capsule 0  . ALPRAZolam (XANAX) 0.5 MG tablet 1-1.5 mg at night as needed for anxiety/sleep 90 tablet 0  . aspirin EC 81 MG tablet Take 81 mg by mouth daily.    . clopidogrel (PLAVIX) 75 MG tablet Take 1 tablet (75 mg total) by mouth daily. 90 tablet 3  . EPIPEN 2-PAK 0.3 MG/0.3ML SOAJ injection Inject 0.3 mg into the skin daily as needed (allergic reaction).     . Evolocumab 140 MG/ML SOSY Inject 140 mg into the skin every 14 (fourteen) days.    . insulin lispro (HUMALOG) 100 UNIT/ML injection continuous. Insulin pump    . levothyroxine (SYNTHROID) 75 MCG tablet Take 75 mcg by mouth daily before breakfast.     . methylphenidate (RITALIN) 5 MG tablet Take 1 tablet (5 mg total) by mouth daily at 12 noon. 30 tablet 0  . Multiple Vitamin (MULTIVITAMIN WITH MINERALS) TABS tablet Take 2 tablets by mouth daily.    03/31/2014 OVER THE COUNTER MEDICATION Take 2 tablets by mouth daily. D-E-K-A otc supplement    . pantoprazole (PROTONIX) 40 MG tablet Take 80 mg by mouth at  bedtime.     [name] PRESCRIPTION MEDICATION Take 2-3 capsules by mouth See admin instructions. BIOKACE * ONLY IF HAVING A MEAL - 3 caps with meals and 2 caps with snacks    . Vitamin D, Ergocalciferol, (DRISDOL) 1.25 MG (50000 UNIT) CAPS capsule Take 50,000 Units by mouth 3 (three) times a week.      No  current facility-administered medications for this visit.     Musculoskeletal: Strength & Muscle Tone: N/A Gait & Station: N/A Patient leans: N/A  Psychiatric Specialty Exam: Review of Systems  Psychiatric/Behavioral: Positive for decreased concentration. Negative for agitation, behavioral problems, confusion, dysphoric mood, hallucinations, self-injury, sleep disturbance and suicidal ideas. The patient is not nervous/anxious and is not hyperactive.   All other systems reviewed and are negative.   There were no vitals taken for this visit.There is no height or weight on file to calculate BMI.  General Appearance: Fairly Groomed  Eye Contact:  Good  Speech:  Clear and Coherent  Volume:  Normal  Mood:  good  Affect:  Appropriate, Congruent and euthymic  Thought Process:  Coherent  Orientation:  Full (Time, Place, and Person)  Thought Content: Logical   Suicidal Thoughts:  No  Homicidal Thoughts:  No  Memory:  Immediate;   Good  Judgement:  Good  Insight:  Good  Psychomotor Activity:  Normal  Concentration:  Concentration: Good and Attention Span: Good  Recall:  Good  Fund of Knowledge: Good  Language: Good  Akathisia:  No  Handed:  Right  AIMS (if indicated): not done  Assets:  Communication Skills Desire for Improvement  ADL's:  Intact  Cognition: WNL  Sleep:  Good   Screenings:   Assessment and Plan:  JALYN DUTTA is a 45 y.o. year old female with a history of  ADHD, depression,hashimoto's thyroiditis,AIH/Hereditary pancreatitiss/p islet cell transplant,diabetes,s/p Roux-en- Y bypass surgery,left lower extremity bypass with a subsequent balloon angioplasty,, who  presents for follow up appointment for below.   1. Attention deficit hyperactivity disorder (ADHD), unspecified ADHD type She continues to struggle with ADHD symptoms despite medication change.  We will switch to Vyvanse to see if it will be more effective for ADHD.  We will start from lower dose given patient had adverse reaction of palpitation from higher dose of Concerta.  Discussed potential risks, which includes but not limited to palpitation, worsening in anxiety, insomnia and appetite loss.   2. Insomnia, unspecified type She reports good benefit from Xanax for insomnia, and has been able to taper down the dose.  Will continue lower dose to target insomnia.  Discussed potential risk of dependence and oversedation.  Noted that this medication has chosen given she had no benefits from other hypnotics.   Plan 1. Discontinue Concerta, Ritalin 2. Start vyvanse 20 mg daily  3. Decrease Xanax 0.5 mg at night as needed for sleep. - she will contact the office if she needs refill  4.Next appointment:1/20 at 8:36for 30 mins, video   I have utilized the [place] Controlled Substances Reporting System (PMP AWARxE) to confirm adherence regarding the patient's medication. My review reveals appropriate prescription fills.   Past trials of medication:sertraline, fluoxetine,Paxil, citalopram, lexapro, bupropion (voices), venlafaxine, duloxetine, trintellix, mirtazapine, trazodone, amitriptyline,doxepin (insomnia),lamotrigine,Geodon, olanzapine,quetiapine,Vraylar, xanax, clonazepam,temazepam,ritalin, trazodone, Ambien, Rozerem,Lunesta, Belsomra, benadryl,Concerta (palpitation at 36 mg)  The patient demonstrates the following risk factors for suicide: Chronic risk factors for suicide include:psychiatric disorder ofdepressionand medical illness pancreatitis. Acute risk factorsfor suicide include: unemployment. Protective factorsfor this patient include: positive social support, coping skills  and hope for the future. Considering these factors, the overall suicide risk at this point appears to below. Patientisappropriate for outpatient follow up.   Jessica Hotter, MD 07/10/2020, 9:02 AM

## 2020-07-09 ENCOUNTER — Telehealth: Payer: Self-pay

## 2020-07-09 NOTE — Telephone Encounter (Signed)
Patient seen in December for toe discoloration. 2 toes are now black, and 3 are purple. This has gotten progressively worse. Foot is cool and painful. Put on schedule for tomorrow.

## 2020-07-10 ENCOUNTER — Encounter: Payer: Self-pay | Admitting: *Deleted

## 2020-07-10 ENCOUNTER — Telehealth (INDEPENDENT_AMBULATORY_CARE_PROVIDER_SITE_OTHER): Payer: Medicare (Managed Care) | Admitting: Psychiatry

## 2020-07-10 ENCOUNTER — Other Ambulatory Visit: Payer: Self-pay

## 2020-07-10 ENCOUNTER — Ambulatory Visit (INDEPENDENT_AMBULATORY_CARE_PROVIDER_SITE_OTHER): Payer: Medicare (Managed Care) | Admitting: Physician Assistant

## 2020-07-10 ENCOUNTER — Ambulatory Visit (HOSPITAL_COMMUNITY)
Admission: RE | Admit: 2020-07-10 | Discharge: 2020-07-10 | Disposition: A | Discharge status: Home / Self Care | Payer: Medicare (Managed Care) | Source: Ambulatory Visit | Attending: Vascular Surgery | Admitting: Vascular Surgery

## 2020-07-10 ENCOUNTER — Encounter: Payer: Self-pay | Admitting: Psychiatry

## 2020-07-10 VITALS — BP 122/83 | HR 104 | Temp 98.0°F | Resp 20 | Ht 66.0 in | Wt 152.9 lb

## 2020-07-10 DIAGNOSIS — L97519 Non-pressure chronic ulcer of other part of right foot with unspecified severity: Secondary | ICD-10-CM

## 2020-07-10 DIAGNOSIS — F909 Attention-deficit hyperactivity disorder, unspecified type: Secondary | ICD-10-CM | POA: Diagnosis not present

## 2020-07-10 DIAGNOSIS — G4700 Insomnia, unspecified: Secondary | ICD-10-CM | POA: Diagnosis not present

## 2020-07-10 DIAGNOSIS — E1065 Type 1 diabetes mellitus with hyperglycemia: Secondary | ICD-10-CM | POA: Diagnosis not present

## 2020-07-10 DIAGNOSIS — I779 Disorder of arteries and arterioles, unspecified: Secondary | ICD-10-CM

## 2020-07-10 DIAGNOSIS — IMO0002 Reserved for concepts with insufficient information to code with codable children: Secondary | ICD-10-CM

## 2020-07-10 MED ORDER — LISDEXAMFETAMINE DIMESYLATE 20 MG PO CAPS: 20.0000 mg | ORAL_CAPSULE | Freq: Every day | ORAL | 0 refills | Status: DC

## 2020-07-10 MED ORDER — HYDROCODONE-ACETAMINOPHEN 5-325 MG PO TABS: 1.0000 | ORAL_TABLET | Freq: Four times a day (QID) | ORAL | 0 refills | Status: DC | PRN

## 2020-07-10 NOTE — H&P (View-Only) (Signed)
Established PAD Patient   History of Present Illness   Jessica [name] is a 45 y.o. (05-16-1976) female who presents for evaluation of new ulceration of right foot.  She is well-known to [facility] with history of right common iliac artery stenting on 2 occasions by Dr. Randie Heinz.  She also previously had a left femoral popliteal bypass performed in [place].  Bypass required an intervention in the remote past also by Dr. Randie Heinz.  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  She was last seen in the office by Dr. Randie Heinz in December of last year.  The patient had discolored toes however had a normal ABI and TBI with a 2+ palpable DP pulse.  She returns office today having seen her podiatrist to be seen for a new lateral foot necrotic ulceration which is causing her 10 out of 10 pain consistently.  She fears that she will require amputation of her leg if she does not receive treatment.  She is also an insulin-dependent diabetic with an A1c of 8.  She states that her insulin has recently been adjusted however still maintains a glucose of around 200 due to the nutritional requirements through tube feedings.  She quit smoking in November of last year.  The patient's PMH, PSH, SH, and FamHx were reviewed and are unchanged from prior visit.  Current Outpatient Medications  Medication Sig Dispense Refill  . ALPRAZolam (XANAX) 0.5 MG tablet 1-1.5 mg at night as needed for anxiety/sleep 90 tablet 0  . aspirin EC 81 MG tablet Take 81 mg by mouth daily.    . clopidogrel (PLAVIX) 75 MG tablet Take 1 tablet (75 mg total) by mouth daily. 90 tablet 3  . docusate sodium (COLACE) 100 MG capsule Take 1 capsule by mouth daily as needed.    [name] EPIPEN 2-PAK 0.3 MG/0.3ML SOAJ injection Inject 0.3 mg into the skin daily as needed (allergic reaction).     . Evolocumab 140 MG/ML SOSY Inject 140 mg into the skin every 14 (fourteen) days.    . fluconazole  (DIFLUCAN) 150 MG tablet Take 150 mg by mouth once.    [name] glucose blood (PRECISION QID TEST) test strip 1 strip by external route three times daily One touch verio    . HYDROcodone-acetaminophen (NORCO) 5-325 MG tablet Take 1 tablet by mouth every 6 (six) hours as needed for moderate pain. 20 tablet 0  . insulin lispro (HUMALOG) 100 UNIT/ML injection continuous. Insulin pump    . levothyroxine (SYNTHROID) 75 MCG tablet Take 75 mcg by mouth daily before breakfast.     . lisdexamfetamine (VYVANSE) 20 MG capsule Take 1 capsule (20 mg total) by mouth daily. 30 capsule 0  . methylphenidate (RITALIN) 5 MG tablet Take 1 tablet (5 mg total) by mouth daily at 12 noon. 30 tablet 0  . Multiple Vitamin (MULTIVITAMIN WITH MINERALS) TABS tablet Take 2 tablets by mouth daily.    . Multiple Vitamins-Minerals (ADULT ONE DAILY GUMMIES) CHEW Chew 2 tablets by mouth daily.    Letta Pate VERIO test strip 3 (three) times daily.    [name] OVER THE COUNTER MEDICATION Take 2 tablets by mouth daily. D-E-K-A otc supplement    . Pancrelipase, Lip-Prot-Amyl, (PERTZYE) 16000-57500 units CPEP 1 cap(s)    . pantoprazole (PROTONIX) 40 MG tablet Take 80 mg by mouth at bedtime.     [name] PRESCRIPTION MEDICATION Take 2-3 capsules by mouth See admin instructions. BIOKACE * ONLY IF HAVING  A MEAL - 3 caps with meals and 2 caps with snacks    . Vitamin D, Ergocalciferol, (DRISDOL) 1.25 MG (50000 UNIT) CAPS capsule Take 50,000 Units by mouth 3 (three) times a week.      No current facility-administered medications for this visit.    REVIEW OF SYSTEMS (negative unless checked):   Cardiac:  []  Chest pain or chest pressure? []  Shortness of breath upon activity? []  Shortness of breath when lying flat? []  Irregular heart rhythm?  Vascular:  []  Pain in calf, thigh, or hip brought on by walking? []  Pain in feet at night that wakes you up from your sleep? []  Blood clot in your veins? []  Leg swelling?  Pulmonary:  []  Oxygen at home? []   Productive cough? []  Wheezing?  Neurologic:  []  Sudden weakness in arms or legs? []  Sudden numbness in arms or legs? []  Sudden onset of difficult speaking or slurred speech? []  Temporary loss of vision in one eye? []  Problems with dizziness?  Gastrointestinal:  []  Blood in stool? []  Vomited blood?  Genitourinary:  []  Burning when urinating? []  Blood in urine?  Psychiatric:  []  Major depression  Hematologic:  []  Bleeding problems? []  Problems with blood clotting?  Dermatologic:  []  Rashes or ulcers?  Constitutional:  []  Fever or chills?  Ear/Nose/Throat:  []  Change in hearing? []  Nose bleeds? []  Sore throat?  Musculoskeletal:  []  Back pain? []  Joint pain? []  Muscle pain?   Physical Examination   Vitals:   07/10/20 1132  BP: 122/83  Pulse: (!) 104  Resp: 20  Temp: 98 F (36.7 C)  TempSrc: Temporal  SpO2: 99%  Weight: 152 lb 14.4 oz (69.4 kg)  Height: 5\' 6"  (1.676 m)   Body mass index is 24.68 kg/m.  General:  WDWN in NAD; vital signs documented above Gait: Not observed HENT: WNL, normocephalic Pulmonary: normal non-labored breathing , without Rales, rhonchi,  wheezing Cardiac: regular HR Abdomen: soft, NT, no masses Skin: without rashes Vascular Exam/Pulses:  Right Left  Radial 2+ (normal) 2+ (normal)  DP 2+ (normal) 2+ (normal)  PT absent 1+ (weak)   Extremities: discolored 2,4,5 toes of R foot with necrotic 1x2cm ulceration of lateral foot Musculoskeletal: no muscle wasting or atrophy  Neurologic: A&O X 3;  No focal weakness or paresthesias are detected Psychiatric:  The pt has Normal affect.  Non-Invasive Vascular Imaging   ABI  ABI/TBIToday's ABIToday's TBIPrevious ABIPrevious TBI  +-------+-----------+-----------+------------+------------+  Right 0.80    0.60    1.02    1.08      +-------+-----------+-----------+------------+------------+  Left  0.95    0.77    1.14    0.85    Medical  Decision Making   Jessica [name] is a 45 y.o. female who presents with new R lateral foot ulceration   Since last office visit 1 month ago there is a change in ABI/TBI as well as a new necrotic right lateral foot ulceration  Plan will be for angiography with possible intervention with Dr. next available  Patient is familiar with the risks of angiography and agrees to proceed  Patient was also prescribed #20 5/325 mg Norco due to painful right foot ulceration; no refills   PA-C [facility] Office: [phone]  Clinic MD: 

## 2020-07-10 NOTE — Progress Notes (Signed)
  Established PAD Patient   History of Present Illness   Jessica [name] is a 45 y.o. (02/08/1976) female who presents for evaluation of new ulceration of right foot.  She is well-known to [facility] with history of right common iliac artery stenting on 2 occasions by Dr. Cain.  She also previously had a left femoral popliteal bypass performed in [place].  Bypass required an intervention in the remote past also by Dr. Cain.  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  She was last seen in the office by Dr. Cain in December of last year.  The patient had discolored toes however had a normal ABI and TBI with a 2+ palpable DP pulse.  She returns office today having seen her podiatrist to be seen for a new lateral foot necrotic ulceration which is causing her 10 out of 10 pain consistently.  She fears that she will require amputation of her leg if she does not receive treatment.  She is also an insulin-dependent diabetic with an A1c of 8.  She states that her insulin has recently been adjusted however still maintains a glucose of around 200 due to the nutritional requirements through tube feedings.  She quit smoking in November of last year.  The patient's PMH, PSH, SH, and FamHx were reviewed and are unchanged from prior visit.  Current Outpatient Medications  Medication Sig Dispense Refill  . ALPRAZolam (XANAX) 0.5 MG tablet 1-1.5 mg at night as needed for anxiety/sleep 90 tablet 0  . aspirin EC 81 MG tablet Take 81 mg by mouth daily.    . clopidogrel (PLAVIX) 75 MG tablet Take 1 tablet (75 mg total) by mouth daily. 90 tablet 3  . docusate sodium (COLACE) 100 MG capsule Take 1 capsule by mouth daily as needed.    . EPIPEN 2-PAK 0.3 MG/0.3ML SOAJ injection Inject 0.3 mg into the skin daily as needed (allergic reaction).     . Evolocumab 140 MG/ML SOSY Inject 140 mg into the skin every 14 (fourteen) days.    . fluconazole  (DIFLUCAN) 150 MG tablet Take 150 mg by mouth once.    . glucose blood (PRECISION QID TEST) test strip 1 strip by external route three times daily One touch verio    . HYDROcodone-acetaminophen (NORCO) 5-325 MG tablet Take 1 tablet by mouth every 6 (six) hours as needed for moderate pain. 20 tablet 0  . insulin lispro (HUMALOG) 100 UNIT/ML injection continuous. Insulin pump    . levothyroxine (SYNTHROID) 75 MCG tablet Take 75 mcg by mouth daily before breakfast.     . lisdexamfetamine (VYVANSE) 20 MG capsule Take 1 capsule (20 mg total) by mouth daily. 30 capsule 0  . methylphenidate (RITALIN) 5 MG tablet Take 1 tablet (5 mg total) by mouth daily at 12 noon. 30 tablet 0  . Multiple Vitamin (MULTIVITAMIN WITH MINERALS) TABS tablet Take 2 tablets by mouth daily.    . Multiple Vitamins-Minerals (ADULT ONE DAILY GUMMIES) CHEW Chew 2 tablets by mouth daily.    . ONETOUCH VERIO test strip 3 (three) times daily.    . OVER THE COUNTER MEDICATION Take 2 tablets by mouth daily. D-[id number]-K-A otc supplement    . Pancrelipase, Lip-Prot-Amyl, (PERTZYE) 16000-57500 units CPEP 1 cap(s)    . pantoprazole (PROTONIX) 40 MG tablet Take 80 mg by mouth at bedtime.     . PRESCRIPTION MEDICATION Take 2-3 capsules by mouth See admin instructions. BIOKACE * ONLY IF HAVING   A MEAL - 3 caps with meals and 2 caps with snacks    . Vitamin D, Ergocalciferol, (DRISDOL) 1.25 MG (50000 UNIT) CAPS capsule Take 50,000 Units by mouth 3 (three) times a week.      No current facility-administered medications for this visit.    REVIEW OF SYSTEMS (negative unless checked):   Cardiac:  []  Chest pain or chest pressure? []  Shortness of breath upon activity? []  Shortness of breath when lying flat? []  Irregular heart rhythm?  Vascular:  []  Pain in calf, thigh, or hip brought on by walking? []  Pain in feet at night that wakes you up from your sleep? []  Blood clot in your veins? []  Leg swelling?  Pulmonary:  []  Oxygen at home? []   Productive cough? []  Wheezing?  Neurologic:  []  Sudden weakness in arms or legs? []  Sudden numbness in arms or legs? []  Sudden onset of difficult speaking or slurred speech? []  Temporary loss of vision in one eye? []  Problems with dizziness?  Gastrointestinal:  []  Blood in stool? []  Vomited blood?  Genitourinary:  []  Burning when urinating? []  Blood in urine?  Psychiatric:  []  Major depression  Hematologic:  []  Bleeding problems? []  Problems with blood clotting?  Dermatologic:  []  Rashes or ulcers?  Constitutional:  []  Fever or chills?  Ear/Nose/Throat:  []  Change in hearing? []  Nose bleeds? []  Sore throat?  Musculoskeletal:  []  Back pain? []  Joint pain? []  Muscle pain?   Physical Examination   Vitals:   07/10/20 1132  BP: 122/83  Pulse: (!) 104  Resp: 20  Temp: 98 F (36.7 C)  TempSrc: Temporal  SpO2: 99%  Weight: 152 lb 14.4 oz (69.4 kg)  Height: 5\' 6"  (1.676 m)   Body mass index is 24.68 kg/m.  General:  WDWN in NAD; vital signs documented above Gait: Not observed HENT: WNL, normocephalic Pulmonary: normal non-labored breathing , without Rales, rhonchi,  wheezing Cardiac: regular HR Abdomen: soft, NT, no masses Skin: without rashes Vascular Exam/Pulses:  Right Left  Radial 2+ (normal) 2+ (normal)  DP 2+ (normal) 2+ (normal)  PT absent 1+ (weak)   Extremities: discolored 2,4,5 toes of R foot with necrotic 1x2cm ulceration of lateral foot Musculoskeletal: no muscle wasting or atrophy  Neurologic: A&O X 3;  No focal weakness or paresthesias are detected Psychiatric:  The pt has Normal affect.  Non-Invasive Vascular Imaging   ABI  ABI/TBIToday's ABIToday's TBIPrevious ABIPrevious TBI  +-------+-----------+-----------+------------+------------+  Right 0.80    0.60    1.02    1.08      +-------+-----------+-----------+------------+------------+  Left  0.95    0.77    1.14    0.85    Medical  Decision Making   HANSIKA LEAMING is a 45 y.o. female who presents with new R lateral foot ulceration   Since last office visit 1 month ago there is a change in ABI/TBI as well as a new necrotic right lateral foot ulceration  Plan will be for angiography with possible intervention with Dr. next available  Patient is familiar with the risks of angiography and agrees to proceed  Patient was also prescribed #20 5/325 mg Norco due to painful right foot ulceration; no refills   PA-C [facility] Office: [phone]  Clinic MD: 

## 2020-07-16 ENCOUNTER — Telehealth: Payer: Self-pay

## 2020-07-16 NOTE — Telephone Encounter (Signed)
Patient called to report she is not a candidate for the hyperbaric chamber - 4th and 5th toes are now discolored. Advised her if the foot goes cold - go to the ED, offered to move up procedure with a different MD, patient prefers to wait for [facility].

## 2020-07-18 DIAGNOSIS — Z931 Gastrostomy status: Secondary | ICD-10-CM | POA: Insufficient documentation

## 2020-07-21 ENCOUNTER — Telehealth (HOSPITAL_COMMUNITY): Payer: Self-pay | Admitting: Psychiatry

## 2020-07-25 ENCOUNTER — Other Ambulatory Visit (HOSPITAL_COMMUNITY)
Admission: RE | Admit: 2020-07-25 | Discharge: 2020-07-25 | Disposition: A | Discharge status: Home / Self Care | Payer: Medicare (Managed Care) | Source: Ambulatory Visit | Attending: Vascular Surgery | Admitting: Vascular Surgery

## 2020-07-25 DIAGNOSIS — Z20822 Contact with and (suspected) exposure to covid-19: Secondary | ICD-10-CM | POA: Diagnosis not present

## 2020-07-25 DIAGNOSIS — Z01812 Encounter for preprocedural laboratory examination: Secondary | ICD-10-CM | POA: Diagnosis present

## 2020-07-25 LAB — SARS CORONAVIRUS 2 (TAT 6-24 HRS): SARS Coronavirus 2: NEGATIVE

## 2020-07-28 ENCOUNTER — Other Ambulatory Visit: Payer: Self-pay

## 2020-07-28 ENCOUNTER — Ambulatory Visit (HOSPITAL_COMMUNITY)
Admission: RE | Disposition: A | Discharge status: Home / Self Care | Payer: Self-pay | Source: Home / Self Care | Attending: Vascular Surgery

## 2020-07-28 ENCOUNTER — Ambulatory Visit (HOSPITAL_COMMUNITY)
Admission: RE | Admit: 2020-07-28 | Discharge: 2020-07-28 | Disposition: A | Discharge status: Home / Self Care | Payer: Medicare (Managed Care) | Attending: Vascular Surgery | Admitting: Vascular Surgery

## 2020-07-28 ENCOUNTER — Encounter (HOSPITAL_COMMUNITY): Payer: Self-pay | Admitting: Vascular Surgery

## 2020-07-28 DIAGNOSIS — Z87891 Personal history of nicotine dependence: Secondary | ICD-10-CM | POA: Insufficient documentation

## 2020-07-28 DIAGNOSIS — Z90410 Acquired total absence of pancreas: Secondary | ICD-10-CM | POA: Diagnosis not present

## 2020-07-28 DIAGNOSIS — Z9884 Bariatric surgery status: Secondary | ICD-10-CM | POA: Insufficient documentation

## 2020-07-28 DIAGNOSIS — I70234 Atherosclerosis of native arteries of right leg with ulceration of heel and midfoot: Secondary | ICD-10-CM

## 2020-07-28 DIAGNOSIS — Z79890 Hormone replacement therapy (postmenopausal): Secondary | ICD-10-CM | POA: Diagnosis not present

## 2020-07-28 DIAGNOSIS — L97519 Non-pressure chronic ulcer of other part of right foot with unspecified severity: Secondary | ICD-10-CM | POA: Diagnosis not present

## 2020-07-28 DIAGNOSIS — Z7902 Long term (current) use of antithrombotics/antiplatelets: Secondary | ICD-10-CM | POA: Insufficient documentation

## 2020-07-28 DIAGNOSIS — Z7982 Long term (current) use of aspirin: Secondary | ICD-10-CM | POA: Insufficient documentation

## 2020-07-28 DIAGNOSIS — Z79899 Other long term (current) drug therapy: Secondary | ICD-10-CM | POA: Diagnosis not present

## 2020-07-28 DIAGNOSIS — E11621 Type 2 diabetes mellitus with foot ulcer: Secondary | ICD-10-CM | POA: Insufficient documentation

## 2020-07-28 DIAGNOSIS — Z794 Long term (current) use of insulin: Secondary | ICD-10-CM | POA: Diagnosis not present

## 2020-07-28 HISTORY — PX: ABDOMINAL AORTOGRAM W/LOWER EXTREMITY: CATH118223

## 2020-07-28 LAB — POCT I-STAT, CHEM 8
BUN: 8 mg/dL (ref 6–20)
Calcium, Ion: 1.16 mmol/L (ref 1.15–1.40)
Chloride: 101 mmol/L (ref 98–111)
Creatinine, Ser: 0.5 mg/dL (ref 0.44–1.00)
Glucose, Bld: 192 mg/dL — ABNORMAL HIGH (ref 70–99)
HCT: 44 % (ref 36.0–46.0)
Hemoglobin: 15 g/dL (ref 12.0–15.0)
Potassium: 4.2 mmol/L (ref 3.5–5.1)
Sodium: 140 mmol/L (ref 135–145)
TCO2: 27 mmol/L (ref 22–32)

## 2020-07-28 LAB — GLUCOSE, CAPILLARY: Glucose-Capillary: 134 mg/dL — ABNORMAL HIGH (ref 70–99)

## 2020-07-28 SURGERY — ABDOMINAL AORTOGRAM W/LOWER EXTREMITY
Anesthesia: LOCAL

## 2020-07-28 MED ORDER — HEPARIN (PORCINE) IN NACL 1000-0.9 UT/500ML-% IV SOLN
INTRAVENOUS | Status: AC
  Filled 2020-07-28: qty 500

## 2020-07-28 MED ORDER — SODIUM CHLORIDE 0.9 % IV SOLN: INTRAVENOUS | Status: DC

## 2020-07-28 MED ORDER — IODIXANOL 320 MG/ML IV SOLN
INTRAVENOUS | Status: DC | PRN
  Administered 2020-07-28: 97 mL via INTRA_ARTERIAL

## 2020-07-28 MED ORDER — SODIUM CHLORIDE 0.9 % IV SOLN: 250.0000 mL | INTRAVENOUS | Status: DC | PRN

## 2020-07-28 MED ORDER — SODIUM CHLORIDE 0.9% FLUSH: 3.0000 mL | INTRAVENOUS | Status: DC | PRN

## 2020-07-28 MED ORDER — MIDAZOLAM HCL 2 MG/2ML IJ SOLN
INTRAMUSCULAR | Status: DC | PRN
  Administered 2020-07-28 (×2): 1 mg via INTRAVENOUS

## 2020-07-28 MED ORDER — FENTANYL CITRATE (PF) 100 MCG/2ML IJ SOLN
INTRAMUSCULAR | Status: DC | PRN
  Administered 2020-07-28: 50 ug via INTRAVENOUS
  Administered 2020-07-28: 25 ug via INTRAVENOUS

## 2020-07-28 MED ORDER — FENTANYL CITRATE (PF) 100 MCG/2ML IJ SOLN
50.0000 ug | Freq: Once | INTRAMUSCULAR | Status: AC
  Administered 2020-07-28: 50 ug via INTRAVENOUS

## 2020-07-28 MED ORDER — FENTANYL CITRATE (PF) 100 MCG/2ML IJ SOLN
INTRAMUSCULAR | Status: AC
  Filled 2020-07-28: qty 2

## 2020-07-28 MED ORDER — LIDOCAINE HCL (PF) 1 % IJ SOLN
INTRAMUSCULAR | Status: DC | PRN
  Administered 2020-07-28: 15 mL via INTRADERMAL

## 2020-07-28 MED ORDER — LABETALOL HCL 5 MG/ML IV SOLN: 10.0000 mg | INTRAVENOUS | Status: DC | PRN

## 2020-07-28 MED ORDER — MIDAZOLAM HCL 2 MG/2ML IJ SOLN
INTRAMUSCULAR | Status: AC
  Filled 2020-07-28: qty 2

## 2020-07-28 MED ORDER — ONDANSETRON HCL 4 MG/2ML IJ SOLN: 4.0000 mg | Freq: Four times a day (QID) | INTRAMUSCULAR | Status: DC | PRN

## 2020-07-28 MED ORDER — ACETAMINOPHEN 325 MG PO TABS: 650.0000 mg | ORAL_TABLET | ORAL | Status: DC | PRN

## 2020-07-28 MED ORDER — SODIUM CHLORIDE 0.9 % WEIGHT BASED INFUSION
1.0000 mL/kg/h | INTRAVENOUS | Status: DC
  Administered 2020-07-28: 1 mL/kg/h via INTRAVENOUS

## 2020-07-28 MED ORDER — SODIUM CHLORIDE 0.9% FLUSH: 3.0000 mL | Freq: Two times a day (BID) | INTRAVENOUS | Status: DC

## 2020-07-28 MED ORDER — HYDRALAZINE HCL 20 MG/ML IJ SOLN: 5.0000 mg | INTRAMUSCULAR | Status: DC | PRN

## 2020-07-28 MED ORDER — LIDOCAINE HCL (PF) 1 % IJ SOLN
INTRAMUSCULAR | Status: AC
  Filled 2020-07-28: qty 30

## 2020-07-28 SURGICAL SUPPLY — 10 items
CATH OMNI FLUSH 5F 65CM (CATHETERS) ×2 IMPLANT
CATH STRAIGHT 5FR 65CM (CATHETERS) ×2 IMPLANT
KIT MICROPUNCTURE NIT STIFF (SHEATH) ×2 IMPLANT
KIT PV (KITS) ×2 IMPLANT
SHEATH PINNACLE 5F 10CM (SHEATH) ×2 IMPLANT
SHEATH PROBE COVER 6X72 (BAG) ×2 IMPLANT
SYR MEDRAD MARK V 150ML (SYRINGE) ×2 IMPLANT
TRANSDUCER W/STOPCOCK (MISCELLANEOUS) ×2 IMPLANT
TRAY PV CATH (CUSTOM PROCEDURE TRAY) ×2 IMPLANT
WIRE STARTER BENTSON 035X150 (WIRE) ×2 IMPLANT

## 2020-07-28 NOTE — Progress Notes (Signed)
SITE AREA: right groin  SITE PRIOR TO REMOVAL:  LEVEL 0  PRESSURE APPLIED FOR: for approximately 20 minutes  MANUAL: YES  PATIENT STATUS DURING PULL: WNL  POST PULL SITE:  LEVEL 0  POST PULL INSTRUCTIONS GIVEN: YES  POST PULL PULSES PRESENT:  Bilateral pedal pulses at +1  DRESSING APPLIED: gauze with tegaderm  BEDREST BEGINS @ 0915   COMMENTS:

## 2020-07-28 NOTE — Op Note (Signed)
    Patient name: Jessica [name] MRN: [id number] DOB: [birth year] Sex: female  07/28/2020 Pre-operative Diagnosis: Right foot ulceration Post-operative diagnosis:  Same Surgeon:  Apolinar Junes C. Randie Heinz, MD Procedure Performed: 1.  Ultrasound-guided cannulation right common femoral artery 2.  Aortogram with bilateral lower extremity runoff 3.  Moderate sedation with fentanyl and Versed for 23   Indications: 45 year old female with a history of a total pancreatectomy also has a gastrojejunostomy tube.  She has previous right common iliac artery stenting x2.  No indicated for angiography given wound on her foot.  Findings: The aorta has multiple areas of disease the right common iliac artery stent appears to have stenosis at its ostium.  The left common iliac artery also has some nonflow limiting disease.  Left lower extremity bypass is patent which is a femoropopliteal bypass appears to have three-vessel runoff.  On the right side the SFA and popliteal segments are patent she has three-vessel runoff on the right.  We did a pullback gradient which was 25 mmHg from the renal arteries to the external leg artery on the right.  Most of this is centered distal aorta and around the proximal stent.  Plan will be for aortobifemoral bypass.   Procedure:  The patient was identified in the holding area and taken to room 8.  The patient was then placed supine on the table and prepped and draped in the usual sterile fashion.  A time out was called.  Ultrasound was used to evaluate the right common femoral artery.  This was noted to be patent although was quite diminutive.  The area was anesthetized with 1% lidocaine cannulated micropuncture needle followed by wire sheath.  A Bentson wires placed followed by 5 French sheath.  We placed an Omni catheter at the level of L1 performed aortogram followed bilateral lower extremity runoff.  We then placed a straight catheter to the level of the renal arteries perform pullback  gradient which was at least 25 mmHg from the renal arteries down to the external iliac artery mostly around the distal aorta.  I discussed with the patient we will plan for aortobifemoral bypass.  Contrast: 97 cc     Jniya Madara C. Randie Heinz, MD [facility] Office: [phone] Pager: [phone]

## 2020-07-28 NOTE — Interval H&P Note (Signed)
History and Physical Interval Note:  07/28/2020 7:23 AM  Jessica [name]  has presented today for surgery, with the diagnosis of PAOD.  The various methods of treatment have been discussed with the patient and family. After consideration of risks, benefits and other options for treatment, the patient has consented to  Procedure(s): ABDOMINAL AORTOGRAM W/LOWER EXTREMITY (N/A) as a surgical intervention.  The patient's history has been reviewed, patient examined, no change in status, stable for surgery.  I have reviewed the patient's chart and labs.  Questions were answered to the patient's satisfaction.     [name]

## 2020-07-28 NOTE — Discharge Instructions (Signed)
Femoral Site Care This sheet gives you information about how to care for yourself after your procedure. Your health care provider may also give you more specific instructions. If you have problems or questions, contact your health care provider. What can I expect after the procedure?  After the procedure, it is common to have:  Bruising that usually fades within 1-2 weeks.  Tenderness at the site. Follow these instructions at home: Wound care 1. Follow instructions from your health care provider about how to take care of your insertion site. Make sure you: ? Wash your hands with soap and water before you change your bandage (dressing). If soap and water are not available, use hand sanitizer. ? Remove your dressing as told by your health care provider. 2. Do not take baths, swim, or use a hot tub until your health care provider approves. 3. You may shower 24-48 hours after the procedure or as told by your health care provider. ? Gently wash the site with plain soap and water. ? Pat the area dry with a clean towel. ? Do not rub the site. This may cause bleeding. 4. Do not apply powder or lotion to the site. Keep the site clean and dry. 5. Check your femoral site every day for signs of infection. Check for: ? Redness, swelling, or pain. ? Fluid or blood. ? Warmth. ? Pus or a bad smell. Activity 1. For the first 2-3 days after your procedure, or as long as directed: ? Avoid climbing stairs as much as possible. ? Do not squat. 2. Do not lift anything that is heavier than 10 lb (4.5 kg), or the limit that you are told, until your health care provider says that it is safe. For 5 days 3. Rest as directed. ? Avoid sitting for a long time without moving. Get up to take short walks every 1-2 hours. 4. Do not drive for 24 hours if you were given a medicine to help you relax (sedative). General instructions  Take over-the-counter and prescription medicines only as told by your health care  provider.  Keep all follow-up visits as told by your health care provider. This is important. Contact a health care provider if you have:  A fever or chills.  You have redness, swelling, or pain around your insertion site. Get help right away if:  The catheter insertion area swells very fast.  You pass out.  You suddenly start to sweat or your skin gets clammy.  The catheter insertion area is bleeding, and the bleeding does not stop when you hold steady pressure on the area.  The area near or just beyond the catheter insertion site becomes pale, cool, tingly, or numb. These symptoms may represent a serious problem that is an emergency. Do not wait to see if the symptoms will go away. Get medical help right away. Call your local emergency services (911 in the [place] Do not drive yourself to the hospital. Summary  After the procedure, it is common to have bruising that usually fades within 1-2 weeks.  Check your femoral site every day for signs of infection.  Do not lift anything that is heavier than 10 lb (4.5 kg), or the limit that you are told, until your health care provider says that it is safe. This information is not intended to replace advice given to you by your health care provider. Make sure you discuss any questions you have with your health care provider. Document Revised: 06/20/2017 Document Reviewed: 06/20/2017 [employer] Patient Education    2020 [employer]   

## 2020-07-29 MED FILL — Heparin Sod (Porcine)-NaCl IV Soln 1000 Unit/500ML-0.9%: INTRAVENOUS | Qty: 1000 | Status: AC

## 2020-07-29 NOTE — Progress Notes (Signed)
Cardiology Office Note:   Date:  07/30/2020  NAME:  Jessica [name]    MRN: [id number] DOB:  [birth year]   PCP:  Ollen Bowl, MD  Cardiologist:  No primary care provider on file.  Electrophysiologist:  None   Referring MD: Bobette Mo*   Chief Complaint  Patient presents with  . Pre-op Exam    History of Present Illness:   Jessica [name] is a 45 y.o. female with a hx of PAD, gastric bypass, DM, HLD, tobacco abuse who is being seen today for the evaluation of preoperative assessment at the request of Bobette Mo*. Significant PAD and will undergo aorto-bifemoral bypass surgery. She denies any chest pain or shortness of breath. She was a heavy smoker but quit in November. She will undergo planned PAD procedure with Dr. Gwenlyn Saran next week. She underwent a left femoropopliteal bypass in 2018 at [facility]. She underwent a nuclear medicine stress test on 07/22/2016 through their system that showed no evidence of ischemia or infarction. She also underwent an echocardiogram at that time that showed normal LV function no significant valvular heart disease. She reports she cannot walk more than 10 to 15 feet without pain in her legs. She denies any chest pain or shortness of breath. She cannot, flight of stairs. Her main issue is limiting her or her legs. She does suffer from hyperlipidemia and diabetes. Her diabetes apparently was a complication of pancreas surgery in 2015. 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 Apparently after removal of this she required redo surgery and was left with diabetes. Her glucose is not controlled. Most recent A1c 9.7. She is also on Repatha. She is intolerant of statins. No history of hypertension. She never had a heart attack or stroke. No significant kidney disease from what I can tell. She does require insulin with her diabetes.  Surprisingly, no heart issues. BP 132/76. Family history is significant for PAD in her mother. Pancreas issues were in her father. She is a former tobacco abuser. No alcohol use or drug use reported. She is currently disabled. She is married has no children.  Problem List 1. PAD -bilateral common iliac stents (50% stenosis L common iliac) -L fem-pop bypass 2. Gastric bypass -related to pancreas surgery  3. Tobacco abuse -60 pack years 4. Diabetes  -A1c 9.7 -since 2015 (since pancreas removal) 5. HLD -T chol 209, HDL 35, LDL 140, TG 171 6. [place] -2015 -> hereditary pancreatitis and gene for pancreatic CA  Past Medical History: Past Medical History:  Diagnosis Date  . ADHD (attention deficit hyperactivity disorder)   . Anxiety   . Diabetes mellitus type I ([place])   . Diverticulosis of colon without hemorrhage   . DM (diabetes mellitus) ([place])    post pancreatectomy  . Feeding by G-tube ([place])   . GERD (gastroesophageal reflux disease)   . Hashimoto's thyroiditis    euthyroid  . Hypercholesterolemia   . PONV (postoperative nausea and vomiting)     Past Surgical History: Past Surgical History:  Procedure Laterality Date  . ABDOMINAL AORTOGRAM W/LOWER EXTREMITY N/A 04/27/2017   Procedure: ABDOMINAL AORTOGRAM W/LOWER EXTREMITY;  Surgeon: Waynetta Sandy, MD;  Location: [facility];  Service: Cardiovascular;  Laterality: N/A;  . ABDOMINAL AORTOGRAM W/LOWER EXTREMITY Bilateral 06/04/2019   Procedure: ABDOMINAL AORTOGRAM W/LOWER EXTREMITY;  Surgeon: Waynetta Sandy, MD;  Location: [facility];  Service: Cardiovascular;  Laterality: Bilateral;  .  ABDOMINAL AORTOGRAM W/LOWER EXTREMITY N/A 11/26/2019   Procedure: ABDOMINAL AORTOGRAM W/ Right LOWER EXTREMITY [place];  Surgeon: Waynetta Sandy, MD;  Location: [facility];  Service: Cardiovascular;  Laterality: N/A;  . ABDOMINAL AORTOGRAM W/LOWER EXTREMITY N/A 07/28/2020   Procedure: ABDOMINAL  AORTOGRAM W/LOWER EXTREMITY;  Surgeon: Waynetta Sandy, MD;  Location: [facility];  Service: Cardiovascular;  Laterality: N/A;  . ABDOMINAL HYSTERECTOMY    . BIOPSY N/A 05/15/2014   Procedure: GASTIC,  ASCENDING, AND DESCEDNING/SIGMOID  COLON BIOPSY;  Surgeon: Daneil Dolin, MD;  Location: [facility];  Service: Endoscopy;  Laterality: N/A;  . CHOLECYSTECTOMY    . COLONOSCOPY WITH PROPOFOL N/A 05/15/2014   Dr. Gala Romney (primary GI is Dr. Oneida Alar). Hemorrhoids, negative random colon biopsies. GI pathogen panel negative. Diverticulosis.  [name] complete hysterectomy    . ESOPHAGOGASTRODUODENOSCOPY (EGD) WITH PROPOFOL N/A 05/15/2014   Dr. Gala Romney (primary GI Dr. Oneida Alar), Billroth II, inflamed residual gastric mucosa, benign biopsies  . FEMORAL-POPLITEAL BYPASS GRAFT    . 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  [name] KNEE SURGERY Right    X 6-5 arthroscopies and open procedure 1 time- tighten patellar tendon and loose IT band  . 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  [name] PERIPHERAL VASCULAR INTERVENTION Right 06/04/2019   Procedure: PERIPHERAL VASCULAR INTERVENTION;  Surgeon: Waynetta Sandy, MD;  Location: [facility];  Service: Cardiovascular;  Laterality: Right;  common iliac  . PERIPHERAL VASCULAR INTERVENTION Right 11/26/2019   Procedure: PERIPHERAL VASCULAR INTERVENTION;  Surgeon: Waynetta Sandy, MD;  Location: [facility];  Service: Cardiovascular;  Laterality: Right;  Iliac     Current Medications: Current Meds  Medication Sig  . aspirin EC 81 MG tablet Take 81 mg by mouth daily.  . clopidogrel (PLAVIX) 75 MG tablet Take 1 tablet (75 mg total) by mouth daily.  [name] EPIPEN 2-PAK 0.3 MG/0.3ML SOAJ injection Inject 0.3 mg into the skin daily as needed for anaphylaxis (allergic reaction).  . Evolocumab 140 MG/ML SOSY Inject 140 mg into the skin every 14 (fourteen) days. Repatha  . glucose blood (PRECISION QID  TEST) test strip 1 strip by external route three times daily One touch verio  . insulin lispro (HUMALOG) 100 UNIT/ML injection continuous. INSULIN PUMP  . levothyroxine (SYNTHROID) 50 MCG tablet Take 50 mcg by mouth See admin instructions. Take very day except : Sunday  . levothyroxine (SYNTHROID) 75 MCG tablet Take 75 mcg by mouth every Sunday.  . Multiple Vitamin (MULTIVITAMIN WITH MINERALS) TABS tablet Take 2 tablets by mouth daily. DEKA  . Multiple Vitamins-Minerals (ADULT ONE DAILY GUMMIES) CHEW Chew 2 tablets by mouth daily.  [name] NARCAN 4 MG/0.1ML LIQD nasal spray kit Place 1 spray into the nose once as needed for opioid reversal.  . Nutritional Supplements (VIVONEX RTF) LIQD Take 87 mLs by mouth at bedtime. Additional at lunch  . ONETOUCH VERIO test strip 3 (three) times daily.  [name] oxyCODONE (OXY IR/ROXICODONE) 5 MG immediate release tablet Take 5 mg by mouth 3 (three) times daily as needed for severe pain or moderate pain.  . Pancrelipase, Lip-Prot-Amyl, (PERTZYE) 81448-18563 units CPEP Take 1 tablet by mouth 3 (three) times daily with meals. Only when eat or snack  . pantoprazole (PROTONIX) 40 MG tablet Take 80 mg by mouth at bedtime.   . senna (SENOKOT) 8.6 MG TABS tablet Take 1 tablet by mouth daily as needed for mild constipation.  . Vitamin D, Ergocalciferol, (DRISDOL)  1.25 MG (50000 UNIT) CAPS capsule Take 50,000 Units by mouth 3 (three) times a week.      Allergies:    Erythromycin, Levofloxacin, Niaspan [niacin], Penicillins, Shellfish allergy, Sulfa antibiotics, Wellbutrin [bupropion], Morphine, Strawberry extract, Sulfamethoxazole, Tape, Cefdinir, Clindamycin/lincomycin, Doxycycline, Keflex [cephalexin], Statins, Vancomycin, Zetia [ezetimibe], and Glucagon   Social History: Social History   Socioeconomic History  . Marital status: Married    Spouse name: Not on file  . Number of children: 0  . Years of education: Not on file  . Highest education level: Not on file   Occupational History  . Occupation: disabled  Tobacco Use  . Smoking status: Former Smoker    Packs/day: 2.00    Years: 30.00    Pack years: 60.00    Types: Cigarettes  . Smokeless tobacco: Never Used  . Tobacco comment: 1 cig daily  Vaping Use  . Vaping Use: Some days  Substance and Sexual Activity  . Alcohol use: No    Alcohol/week: 0.0 standard drinks  . Drug use: No  . Sexual activity: Yes    Birth control/protection: None  Other Topics Concern  . Not on file  Social History Narrative  . Not on file   Social Determinants of Health   Financial Resource Strain: Not on file  Food Insecurity: Not on file  Transportation Needs: Not on file  Physical Activity: Not on file  Stress: Not on file  Social Connections: Not on file     Family History: The patient's family history includes Cirrhosis in her paternal grandfather; Depression in her father; Heart attack in her paternal grandfather; Pancreatic cancer in her paternal grandfather; Pancreatitis in her father and another family member; Peripheral Artery Disease in her mother; Throat cancer in her mother. There is no history of Colon cancer.  ROS:   All other ROS reviewed and negative. Pertinent positives noted in the HPI.     EKGs/Labs/Other Studies Reviewed:   The following studies were personally reviewed by me today:  EKG:  EKG is ordered today.  The ekg ordered today demonstrates normal sinus rhythm heart rate 75, no acute ischemic changes, and was personally reviewed by me.   Aortogram 07/28/2020 The aorta has multiple areas of disease the right common iliac artery stent appears to have stenosis at its ostium.  The left common iliac artery also has some nonflow limiting disease.  Left lower extremity bypass is patent which is a femoropopliteal bypass appears to have three-vessel runoff.  On the right side the SFA and popliteal segments are patent she has three-vessel runoff on the right.  We did a pullback gradient  which was 25 mmHg from the renal arteries to the external leg artery on the right.  Most of this is centered distal aorta and around the proximal stent.  Plan will be for aortobifemoral bypass.  Recent Labs: 07/28/2020: BUN 8; Creatinine, Ser 0.50; Hemoglobin 15.0; Potassium 4.2; Sodium 140   Recent Lipid Panel No results found for: CHOL, TRIG, HDL, CHOLHDL, VLDL, LDLCALC, LDLDIRECT  Physical Exam:   VS:  BP 132/76   Pulse 75   Ht 5' 6" (1.676 m)   Wt 151 lb 9.6 oz (68.8 kg)   SpO2 96%   BMI 24.47 kg/m    Wt Readings from Last 3 Encounters:  07/30/20 151 lb 9.6 oz (68.8 kg)  07/28/20 145 lb (65.8 kg)  07/10/20 152 lb 14.4 oz (69.4 kg)    General: Well nourished, well developed, in no acute distress Head:  Atraumatic, normal size  Eyes: PEERLA, EOMI  Neck: Supple, no JVD Endocrine: No thryomegaly Cardiac: Normal S1, S2; RRR; no murmurs, rubs, or gallops Lungs: Clear to auscultation bilaterally, no wheezing, rhonchi or rales  Abd: Soft, nontender, no hepatomegaly  Ext: Diminished pulses in the lower extremities Musculoskeletal: No deformities, BUE and BLE strength normal and equal Skin: Warm and dry, no rashes   Neuro: Alert and oriented to person, place, time, and situation, CNII-XII grossly intact, no focal deficits  Psych: Normal mood and affect   ASSESSMENT:   Jessica [name] is a 45 y.o. female who presents for the following: 1. Preoperative cardiovascular examination   2. PAD (peripheral artery disease) ([place])   3. Mixed hyperlipidemia     PLAN:   1. Preoperative cardiovascular examination 2. PAD (peripheral artery disease) ([facility]) -Extensive history of PAD. Will undergo aorto bifemoral bypass surgery next week. No symptoms of angina. Main risk factors include diabetes on insulin. She underwent stress testing in 2018 prior to her left femoropopliteal bypass surgery in [place] that showed normal echocardiogram and normal perfusion imaging study. -She is not able  to complete 4 METS. Given her high risk procedure I recommended a Lexi scan nuclear medicine stress test. As long as her study is low risk she can proceed to surgery. -EKG in office demonstrates normal sinus rhythm with no acute ischemic changes. She had an echocardiogram in 2018 in [place] that was normal. No murmurs on exam. I see no need to repeat her echocardiogram. -She will continue DAPT. She should see [place] yearly.  3. Mixed hyperlipidemia -Diabetic on insulin. Severely elevated lipid levels. Back on Repatha. She will continue this. She will see [place] yearly.  Shared Decision Making/Informed Consent The risks [chest pain, shortness of breath, cardiac arrhythmias, dizziness, blood pressure fluctuations, myocardial infarction, stroke/transient ischemic attack, nausea, vomiting, allergic reaction, radiation exposure, metallic taste sensation and life-threatening complications (estimated to be 1 in 10,000)], benefits (risk stratification, diagnosing coronary artery disease, treatment guidance) and alternatives of a nuclear stress test were discussed in detail with Ms. Curtice and she agrees to proceed.  Disposition: Return in about 1 year (around 07/30/2021).  Medication Adjustments/Labs and Tests Ordered: Current medicines are reviewed at length with the patient today.  Concerns regarding medicines are outlined above.  Orders Placed This Encounter  Procedures  . MYOCARDIAL PERFUSION IMAGING  . EKG 12-Lead   No orders of the defined types were placed in this encounter.   Patient Instructions  Medication Instructions:  The current medical regimen is effective;  continue present plan and medications.  *If you need a refill on your cardiac medications before your next appointment, please call your pharmacy*   Testing/Procedures: Your physician has requested that you have a lexiscan myoview. A cardiac stress test is a cardiological test that measures the heart's ability to respond to external  stress in a controlled clinical environment. The stress response is induced by intravenous pharmacological stimulation.    Follow-Up: At [facility], you and your health needs are our priority.  As part of our continuing mission to provide you with exceptional heart care, we have created designated Provider Care Teams.  These Care Teams include your primary Cardiologist (physician) and Advanced Practice Providers (APPs -  Physician Assistants and Nurse Practitioners) who all work together to provide you with the care you need, when you need it.  We recommend signing up for the patient portal called "MyChart".  Sign up information is provided on this After Visit  Summary.  MyChart is used to connect with patients for Virtual Visits (Telemedicine).  Patients are able to view lab/test results, encounter notes, upcoming appointments, etc.  Non-urgent messages can be sent to your provider as well.   To learn more about what you can do with MyChart, go to [email or web address].    Your next appointment:   12 month(s)  The format for your next appointment:   In Person  Provider:   Eleonore Chiquito, MD         Signed, Addison Naegeli. Audie Box, [place]  [address] [place], [place] [address] [phone]  07/30/2020 11:15 AM

## 2020-07-30 ENCOUNTER — Other Ambulatory Visit: Payer: Self-pay

## 2020-07-30 ENCOUNTER — Ambulatory Visit (INDEPENDENT_AMBULATORY_CARE_PROVIDER_SITE_OTHER): Payer: Medicare (Managed Care) | Admitting: Cardiovascular Disease

## 2020-07-30 ENCOUNTER — Encounter: Payer: Self-pay | Admitting: Cardiovascular Disease

## 2020-07-30 ENCOUNTER — Ambulatory Visit: Payer: Medicare (Managed Care) | Admitting: Cardiovascular Disease

## 2020-07-30 VITALS — BP 132/76 | HR 75 | Ht 66.0 in | Wt 151.6 lb

## 2020-07-30 DIAGNOSIS — I739 Peripheral vascular disease, unspecified: Secondary | ICD-10-CM | POA: Diagnosis not present

## 2020-07-30 DIAGNOSIS — E782 Mixed hyperlipidemia: Secondary | ICD-10-CM

## 2020-07-30 DIAGNOSIS — Z01810 Encounter for preprocedural cardiovascular examination: Secondary | ICD-10-CM

## 2020-07-30 NOTE — Patient Instructions (Signed)
Medication Instructions:  The current medical regimen is effective;  continue present plan and medications.  *If you need a refill on your cardiac medications before your next appointment, please call your pharmacy*   Testing/Procedures: Your physician has requested that you have a lexiscan myoview. A cardiac stress test is a cardiological test that measures the heart's ability to respond to external stress in a controlled clinical environment. The stress response is induced by intravenous pharmacological stimulation.    Follow-Up: At [facility], you and your health needs are our priority.  As part of our continuing mission to provide you with exceptional heart care, we have created designated Provider Care Teams.  These Care Teams include your primary Cardiologist (physician) and Advanced Practice Providers (APPs -  Physician Assistants and Nurse Practitioners) who all work together to provide you with the care you need, when you need it.  We recommend signing up for the patient portal called "MyChart".  Sign up information is provided on this After Visit Summary.  MyChart is used to connect with patients for Virtual Visits (Telemedicine).  Patients are able to view lab/test results, encounter notes, upcoming appointments, etc.  Non-urgent messages can be sent to your provider as well.   To learn more about what you can do with MyChart, go to [email or web address].    Your next appointment:   12 month(s)  The format for your next appointment:   In Person  Provider:   Lennie Odor, MD

## 2020-07-30 NOTE — Addendum Note (Signed)
Addended by: Darene Lamer T on: 07/30/2020 11:16 AM   Modules accepted: Orders

## 2020-07-30 NOTE — Addendum Note (Signed)
Addended by: Sande Rives on: 07/30/2020 01:58 PM   Modules accepted: Orders

## 2020-07-31 ENCOUNTER — Telehealth (HOSPITAL_COMMUNITY): Payer: Self-pay | Admitting: *Deleted

## 2020-07-31 NOTE — Telephone Encounter (Signed)
Close encounter 

## 2020-08-01 ENCOUNTER — Other Ambulatory Visit: Payer: Self-pay

## 2020-08-01 ENCOUNTER — Ambulatory Visit (HOSPITAL_COMMUNITY)
Admission: RE | Admit: 2020-08-01 | Discharge: 2020-08-01 | Disposition: A | Discharge status: Home / Self Care | Payer: Medicare HMO | Source: Ambulatory Visit | Attending: Cardiovascular Disease | Admitting: Cardiovascular Disease

## 2020-08-01 DIAGNOSIS — Z01810 Encounter for preprocedural cardiovascular examination: Secondary | ICD-10-CM | POA: Diagnosis not present

## 2020-08-01 LAB — MYOCARDIAL PERFUSION IMAGING
LV dias vol: 77 mL (ref 46–106)
LV sys vol: 33 mL
Peak HR: 129 {beats}/min
Rest HR: 82 {beats}/min
SDS: 3
SRS: 0
SSS: 3
TID: 1.17

## 2020-08-01 MED ORDER — TECHNETIUM TC 99M TETROFOSMIN IV KIT
27.9000 | PACK | Freq: Once | INTRAVENOUS | Status: AC | PRN
  Administered 2020-08-01: 27.9 via INTRAVENOUS
  Filled 2020-08-01: qty 28

## 2020-08-01 MED ORDER — TECHNETIUM TC 99M TETROFOSMIN IV KIT
9.9000 | PACK | Freq: Once | INTRAVENOUS | Status: AC | PRN
  Administered 2020-08-01: 9.9 via INTRAVENOUS
  Filled 2020-08-01: qty 10

## 2020-08-01 MED ORDER — REGADENOSON 0.4 MG/5ML IV SOLN
0.4000 mg | Freq: Once | INTRAVENOUS | Status: AC
  Administered 2020-08-01: 0.4 mg via INTRAVENOUS

## 2020-08-01 MED ORDER — AMINOPHYLLINE 25 MG/ML IV SOLN
75.0000 mg | Freq: Once | INTRAVENOUS | Status: AC
  Administered 2020-08-01: 75 mg via INTRAVENOUS

## 2020-08-05 ENCOUNTER — Encounter (HOSPITAL_COMMUNITY)
Admission: RE | Admit: 2020-08-05 | Discharge: 2020-08-05 | Disposition: A | Discharge status: Home / Self Care | Payer: Medicare (Managed Care) | Source: Ambulatory Visit | Attending: Vascular Surgery | Admitting: Vascular Surgery

## 2020-08-05 ENCOUNTER — Other Ambulatory Visit (HOSPITAL_COMMUNITY)
Admission: RE | Admit: 2020-08-05 | Discharge: 2020-08-05 | Disposition: A | Discharge status: Home / Self Care | Payer: Medicare (Managed Care) | Source: Ambulatory Visit | Attending: Vascular Surgery | Admitting: Vascular Surgery

## 2020-08-05 ENCOUNTER — Other Ambulatory Visit: Payer: Self-pay

## 2020-08-05 ENCOUNTER — Encounter (HOSPITAL_COMMUNITY): Payer: Self-pay

## 2020-08-05 DIAGNOSIS — Z01812 Encounter for preprocedural laboratory examination: Secondary | ICD-10-CM | POA: Insufficient documentation

## 2020-08-05 DIAGNOSIS — Z20822 Contact with and (suspected) exposure to covid-19: Secondary | ICD-10-CM | POA: Insufficient documentation

## 2020-08-05 LAB — CBC
HCT: 44.4 % (ref 36.0–46.0)
Hemoglobin: 14.8 g/dL (ref 12.0–15.0)
MCH: 31.7 pg (ref 26.0–34.0)
MCHC: 33.3 g/dL (ref 30.0–36.0)
MCV: 95.1 fL (ref 80.0–100.0)
Platelets: 394 10*3/uL (ref 150–400)
RBC: 4.67 MIL/uL (ref 3.87–5.11)
RDW: 12.8 % (ref 11.5–15.5)
WBC: 18.3 10*3/uL — ABNORMAL HIGH (ref 4.0–10.5)
nRBC: 0 % (ref 0.0–0.2)

## 2020-08-05 LAB — COMPREHENSIVE METABOLIC PANEL
ALT: 17 U/L (ref 0–44)
AST: 19 U/L (ref 15–41)
Albumin: 3.5 g/dL (ref 3.5–5.0)
Alkaline Phosphatase: 84 U/L (ref 38–126)
Anion gap: 12 (ref 5–15)
BUN: 6 mg/dL (ref 6–20)
CO2: 22 mmol/L (ref 22–32)
Calcium: 9 mg/dL (ref 8.9–10.3)
Chloride: 104 mmol/L (ref 98–111)
Creatinine, Ser: 0.5 mg/dL (ref 0.44–1.00)
GFR, Estimated: >60 mL/min (ref >60)
Glucose, Bld: 95 mg/dL (ref 70–99)
Potassium: 3.9 mmol/L (ref 3.5–5.1)
Sodium: 138 mmol/L (ref 135–145)
Total Bilirubin: 0.6 mg/dL (ref 0.3–1.2)
Total Protein: 7.2 g/dL (ref 6.5–8.1)

## 2020-08-05 LAB — URINALYSIS, ROUTINE W REFLEX MICROSCOPIC
Bilirubin Urine: NEGATIVE
Glucose, UA: 50 mg/dL — AB
Hgb urine dipstick: NEGATIVE
Ketones, ur: NEGATIVE mg/dL
Leukocytes,Ua: NEGATIVE
Nitrite: NEGATIVE
Protein, ur: NEGATIVE mg/dL
Specific Gravity, Urine: 1.012 (ref 1.005–1.030)
pH: 5 (ref 5.0–8.0)

## 2020-08-05 LAB — BLOOD GAS, ARTERIAL
Acid-Base Excess: 1.7 mmol/L (ref 0.0–2.0)
Bicarbonate: 25.9 mmol/L (ref 20.0–28.0)
Drawn by: 602861
FIO2: 21
O2 Saturation: 97.5 %
Patient temperature: 37
pCO2 arterial: 41.7 mmHg (ref 32.0–48.0)
pH, Arterial: 7.409 (ref 7.350–7.450)
pO2, Arterial: 87.1 mmHg (ref 83.0–108.0)

## 2020-08-05 LAB — SURGICAL PCR SCREEN
MRSA, PCR: NEGATIVE
Staphylococcus aureus: NEGATIVE

## 2020-08-05 LAB — PROTIME-INR
INR: 1.1 (ref 0.8–1.2)
Prothrombin Time: 13.3 seconds (ref 11.4–15.2)

## 2020-08-05 LAB — TYPE AND SCREEN
ABO/RH(D): B POS
Antibody Screen: NEGATIVE

## 2020-08-05 LAB — APTT: aPTT: 28 seconds (ref 24–36)

## 2020-08-05 LAB — GLUCOSE, CAPILLARY: Glucose-Capillary: 136 mg/dL — ABNORMAL HIGH (ref 70–99)

## 2020-08-05 LAB — SARS CORONAVIRUS 2 (TAT 6-24 HRS): SARS Coronavirus 2: NEGATIVE

## 2020-08-05 NOTE — Progress Notes (Signed)
Surgical Instructions    Your procedure is scheduled on Thursday, August 07, 2020.  Report to [facility] Main Entrance "A" at 05:30 A.M., then check in with the Admitting office.  Call this number if you have problems the morning of surgery:  [phone]   If you have any questions prior to your surgery date call [phone]: Open Monday-Friday 8am-4pm    Remember:  Do not eat after midnight the night before your surgery     Take these medicines the morning of surgery with A SIP OF WATER : Levothyroxine (Synthroid)  If needed: Oxycodone (Oxy IR/Roxicodone)  Per your Doctor's instructions, HOLD your Plavix 5 days prior to surgery.  Your Last Dose will be February 12.  Follow your surgeon's instructions on when to stop Aspirin.  If no instructions were given by your surgeon then you will need to call the office to get those instructions.    As of today, STOP taking any Aspirin (unless otherwise instructed by your surgeon) Aleve, Naproxen, Ibuprofen, Motrin, Advil, Goody's, BC's, all herbal medications, fish oil, and all vitamins.           WHAT DO I DO ABOUT MY DIABETES MEDICATION?  . CONTACT YOUR ENDOCRINOLOGIST to receive instructions regarding your Insulin Pump.  If you are unable to receive instructions, REDUCE your basal insulin rate by 20%  . BRING EXTRA INSULIN PUMP SUPPLIES with you to the hospital.   HOW TO MANAGE YOUR DIABETES BEFORE AND AFTER SURGERY  Why is it important to control my blood sugar before and after surgery? . Improving blood sugar levels before and after surgery helps healing and can limit problems. . A way of improving blood sugar control is eating a healthy diet by: o  Eating less sugar and carbohydrates o  Increasing activity/exercise o  Talking with your doctor about reaching your blood sugar goals . High blood sugars (greater than 180 mg/dL) can raise your risk of infections and slow your recovery, so you will need to focus on controlling  your diabetes during the weeks before surgery. . Make sure that the doctor who takes care of your diabetes knows about your planned surgery including the date and location.  How do I manage my blood sugar before surgery? . Check your blood sugar at least 4 times a day, starting 2 days before surgery, to make sure that the level is not too high or low. . Check your blood sugar the morning of your surgery when you wake up and every 2 hours until you get to the Short Stay unit. o If your blood sugar is less than 70 mg/dL, you will need to treat for low blood sugar: - Do not take insulin. - Treat a low blood sugar (less than 70 mg/dL) with  cup of clear juice (cranberry or apple), 4 glucose tablets, OR glucose gel. - Recheck blood sugar in 15 minutes after treatment (to make sure it is greater than 70 mg/dL). If your blood sugar is not greater than 70 mg/dL on recheck, call [phone] for further instructions. . Report your blood sugar to the short stay nurse when you get to Short Stay.  . If you are admitted to the hospital after surgery: o Your blood sugar will be checked by the staff and you will probably be given insulin after surgery (instead of oral diabetes medicines) to make sure you have good blood sugar levels. o The goal for blood sugar control after surgery is 80-180 mg/dL.  Do not wear jewelry, make up, or nail polish            Do not wear lotions, powders, perfumes, or deodorant.            Do not shave 48 hours prior to surgery.            Do not bring valuables to the hospital.            [facility] is not responsible for any belongings or valuables.  Do NOT Smoke (Tobacco/Vaping) or drink Alcohol 24 hours prior to your procedure If you use a CPAP at night, you may bring all equipment for your overnight stay.   Contacts, glasses, dentures or bridgework may not be worn into surgery, please bring cases for these belongings   For patients admitted to the hospital,  discharge time will be determined by your treatment team.   Patients discharged the day of surgery will not be allowed to drive home, and someone needs to stay with them for 24 hours.    Special instructions:   [facility]- Preparing For Surgery  Before surgery, you can play an important role. Because skin is not sterile, your skin needs to be as free of germs as possible. You can reduce the number of germs on your skin by washing with CHG (chlorahexidine gluconate) Soap before surgery.  CHG is an antiseptic cleaner which kills germs and bonds with the skin to continue killing germs even after washing.    Oral Hygiene is also important to reduce your risk of infection.  Remember - BRUSH YOUR TEETH THE MORNING OF SURGERY WITH YOUR REGULAR TOOTHPASTE  Please do not use if you have an allergy to CHG or antibacterial soaps. If your skin becomes reddened/irritated stop using the CHG.  Do not shave (including legs and underarms) for at least 48 hours prior to first CHG shower. It is OK to shave your face.  Please follow these instructions carefully.   1. Shower the NIGHT BEFORE SURGERY and the MORNING OF SURGERY  2. If you chose to wash your hair, wash your hair first as usual with your normal shampoo.  3. After you shampoo, rinse your hair and body thoroughly to remove the shampoo.  4. Wash Face and genitals (private parts) with your normal soap.   5.  Shower the NIGHT BEFORE SURGERY and the MORNING OF SURGERY with CHG Soap.   6. Use CHG Soap as you would any other liquid soap. You can apply CHG directly to the skin and wash gently with a scrungie or a clean washcloth.   7. Apply the CHG Soap to your body ONLY FROM THE NECK DOWN.  Do not use on open wounds or open sores. Avoid contact with your eyes, ears, mouth and genitals (private parts). Wash Face and genitals (private parts)  with your normal soap.   8. Wash thoroughly, paying special attention to the area where your surgery will be  performed.  9. Thoroughly rinse your body with warm water from the neck down.  10. DO NOT shower/wash with your normal soap after using and rinsing off the CHG Soap.  11. Pat yourself dry with a CLEAN TOWEL.  12. Wear CLEAN PAJAMAS to bed the night before surgery  13. Place CLEAN SHEETS on your bed the night before your surgery  14. DO NOT SLEEP WITH PETS.   Day of Surgery: Wear Clean/Comfortable clothing the morning of surgery Do not apply any deodorants/lotions.   Remember  to brush your teeth WITH YOUR REGULAR TOOTHPASTE.   Please read over the following fact sheets that you were given.

## 2020-08-05 NOTE — Progress Notes (Signed)
PCP: Dr. Imagene Sheller-- in [place] Cardiologist: Saw Dr. Flora Lipps for the first time Feb 2022 Endocrinology: Dr. Quita Skye Bailey[facility]  EKG: 07-30-20 CXR: n/a ECHO: 01/2018 C.E. Stress Test: 08/01/20 Cardiac Cath: denies  Stopped Plavix 08/02/20 ASA: Reports instructed by MD to continue as normal, but do not take DOS  Insulin Pump: Aware to contact MD for instructions before surgery.  Will bring extra supplies DOS. Pt states she has a "surgical mode" on her pump. Diabetes Coordinator paged, awaiting return page. Wears continuous glucose monitor, instructed to take clear juice/glucose tabs morning of surgery in case of hypoglycemic event during drive from [facility]  Husband is support person, will be bringing extra G-Tube supplies  Going for Covid testing today.  Patient denies shortness of breath, fever, cough, and chest pain at PAT appointment.  Patient verbalized understanding of instructions provided today at the PAT appointment.  Patient asked to review instructions at home and day of surgery.

## 2020-08-06 ENCOUNTER — Other Ambulatory Visit (HOSPITAL_COMMUNITY): Payer: Medicare (Managed Care)

## 2020-08-06 LAB — HEMOGLOBIN A1C
Hgb A1c MFr Bld: 8.5 % — ABNORMAL HIGH (ref 4.8–5.6)
Mean Plasma Glucose: 197 mg/dL

## 2020-08-06 NOTE — Progress Notes (Signed)
Anesthesia Chart Review:  Extensive history of PAD.  S/p right common iliac artery stenting on 2 occasions and previous left femoral-popliteal bypass performed at [facility].  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  She is on and insulin pump and has a history of suboptimal glycemic control.  Preop A1c 8.5.  She had preop cardiovascular evaluation by Dr. Flora Lipps on 07/30/2020.  Per note, "-Extensive history of PAD. Will undergo aorto bifemoral bypass surgery next week. No symptoms of angina. Main risk factors include diabetes on insulin. She underwent stress testing in 2018 prior to her left femoropopliteal bypass surgery in [place] that showed normal echocardiogram and normal perfusion imaging study. -She is not able to complete 4 METS. Given her high risk procedure I recommended a Lexi scan nuclear medicine stress test. As long as her study is low risk she can proceed to surgery. -EKG in office demonstrates normal sinus rhythm with no acute ischemic changes. She had an echocardiogram in 2018 in [place] that was normal. No murmurs on exam. I see no need to repeat her echocardiogram. -She will continue DAPT. She should see [place] yearly."  Nuclear stress 08/01/2020 was low risk.  Patient reported last dose Plavix 08/02/2020.  Preop labs reviewed, WBC mildly elevated at 18.3 (review of labs in care everywhere shows history of mild leukocytosis), otherwise unremarkable.  EKG 07/30/2020: NSR.  Right atrial margin.  Rightward axis.  Rate 75.  Nuclear stress 08/01/2020:  The left ventricular ejection fraction is normal (55-65%).  Nuclear stress EF: 57%.  There was no ST segment deviation noted during stress.  The study is normal.  This is a low risk study.   Low risk stress nuclear study with normal perfusion and normal left ventricular regional and global systolic function.   Zannie Cove [facility] Short Stay Center/Anesthesiology Phone [phone] 08/06/2020 10:14 AM

## 2020-08-06 NOTE — Anesthesia Preprocedure Evaluation (Addendum)
Anesthesia Evaluation  Patient identified by MRN, date of birth, ID band Patient awake    Reviewed: Allergy & Precautions, NPO status , Patient's Chart, lab work & pertinent test results  History of Anesthesia Complications (+) PONV and history of anesthetic complications  Airway Mallampati: II  TM Distance: >3 FB Neck ROM: Full    Dental  (+) Dental Advisory Given, Edentulous Lower, Edentulous Upper   Pulmonary former smoker,    Pulmonary exam normal breath sounds clear to auscultation       Cardiovascular + Peripheral Vascular Disease  Normal cardiovascular exam Rhythm:Regular Rate:Normal     Neuro/Psych PSYCHIATRIC DISORDERS Anxiety Depression negative neurological ROS     GI/Hepatic GERD  Medicated,S/p pancreatectomy Feeding by G-tube   Endo/Other  diabetes, Type 1, Insulin DependentHypothyroidism   Renal/GU negative Renal ROS     Musculoskeletal negative musculoskeletal ROS (+)   Abdominal   Peds  (+) ADHD Hematology  (+) Blood dyscrasia (Plavix), ,   Anesthesia Other Findings   Reproductive/Obstetrics                           Anesthesia Physical Anesthesia Plan  ASA: III  Anesthesia Plan: General   Post-op Pain Management:    Induction: Intravenous  PONV Risk Score and Plan: 4 or greater and Midazolam, Scopolamine patch - Pre-op, Propofol infusion, Dexamethasone and Ondansetron  Airway Management Planned: Oral ETT  Additional Equipment: Arterial line  Intra-op Plan:   Post-operative Plan: Possible Post-op intubation/ventilation  Informed Consent: I have reviewed the patients History and Physical, chart, labs and discussed the procedure including the risks, benefits and alternatives for the proposed anesthesia with the patient or authorized representative who has indicated his/her understanding and acceptance.     Dental advisory given  Plan Discussed with:  CRNA  Anesthesia Plan Comments: (Glucommander setup for DM Type 1, 2 large bore PIV vs CVL (evaluate in AM).   PAT note by Antionette Poles, PA-C: Extensive history of PAD.  S/p right common iliac artery stenting on 2 occasions and previous left femoral-popliteal bypass performed at [facility].  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  She is on and insulin pump and has a history of suboptimal glycemic control.  Preop A1c 8.5.  She had preop cardiovascular evaluation by Dr. Flora Lipps on 07/30/2020.  Per note, "-Extensive history of PAD. Will undergo aorto bifemoral bypass surgery next week. No symptoms of angina. Main risk factors include diabetes on insulin. She underwent stress testing in 2018 prior to her left femoropopliteal bypass surgery in [place] that showed normal echocardiogram and normal perfusion imaging study. -She is not able to complete 4 METS. Given her high risk procedure I recommended a Lexi scan nuclear medicine stress test. As long as her study is low risk she can proceed to surgery. -EKG in office demonstrates normal sinus rhythm with no acute ischemic changes. She had an echocardiogram in 2018 in [place] that was normal. No murmurs on exam. I see no need to repeat her echocardiogram. -She will continue DAPT. She should see [place] yearly."  Nuclear stress 08/01/2020 was low risk.  Patient reported last dose Plavix 08/02/2020.  Preop labs reviewed, WBC mildly elevated at 18.3 (review of labs in care everywhere shows history of mild leukocytosis), otherwise unremarkable.  EKG 07/30/2020: NSR.  Right atrial margin.  Rightward axis.  Rate 75.  Nuclear stress 08/01/2020:  The left ventricular ejection fraction is normal (55-65%).  Nuclear  stress EF: 57%.  There was no ST segment deviation noted during stress.  The study is normal.  This is a low risk study.   Low risk stress nuclear study with normal perfusion and normal left  ventricular regional and global systolic function.  )      Anesthesia Quick Evaluation

## 2020-08-07 ENCOUNTER — Encounter (HOSPITAL_COMMUNITY)
Admission: RE | Disposition: A | Discharge status: Home Health | Payer: Self-pay | Source: Home / Self Care | Attending: Vascular Surgery

## 2020-08-07 ENCOUNTER — Inpatient Hospital Stay (HOSPITAL_COMMUNITY): Payer: Medicare (Managed Care) | Admitting: Physician Assistant

## 2020-08-07 ENCOUNTER — Inpatient Hospital Stay (HOSPITAL_COMMUNITY)
Admission: RE | Admit: 2020-08-07 | Discharge: 2020-08-17 | DRG: 271 | Disposition: A | Discharge status: Home Health | Payer: Medicare (Managed Care) | Attending: Vascular Surgery | Admitting: Vascular Surgery

## 2020-08-07 ENCOUNTER — Inpatient Hospital Stay (HOSPITAL_COMMUNITY): Payer: Medicare (Managed Care) | Admitting: Certified Registered Nurse Anesthetist

## 2020-08-07 ENCOUNTER — Inpatient Hospital Stay (HOSPITAL_COMMUNITY): Payer: Medicare (Managed Care)

## 2020-08-07 ENCOUNTER — Encounter (HOSPITAL_COMMUNITY): Payer: Self-pay | Admitting: Vascular Surgery

## 2020-08-07 ENCOUNTER — Other Ambulatory Visit: Payer: Self-pay

## 2020-08-07 DIAGNOSIS — Z7982 Long term (current) use of aspirin: Secondary | ICD-10-CM

## 2020-08-07 DIAGNOSIS — Z885 Allergy status to narcotic agent status: Secondary | ICD-10-CM

## 2020-08-07 DIAGNOSIS — F909 Attention-deficit hyperactivity disorder, unspecified type: Secondary | ICD-10-CM | POA: Diagnosis present

## 2020-08-07 DIAGNOSIS — Z800 Family history of malignant neoplasm of digestive organs: Secondary | ICD-10-CM

## 2020-08-07 DIAGNOSIS — Z934 Other artificial openings of gastrointestinal tract status: Secondary | ICD-10-CM | POA: Diagnosis not present

## 2020-08-07 DIAGNOSIS — I714 Abdominal aortic aneurysm, without rupture: Secondary | ICD-10-CM | POA: Diagnosis present

## 2020-08-07 DIAGNOSIS — E1051 Type 1 diabetes mellitus with diabetic peripheral angiopathy without gangrene: Secondary | ICD-10-CM | POA: Diagnosis present

## 2020-08-07 DIAGNOSIS — Y838 Other surgical procedures as the cause of abnormal reaction of the patient, or of later complication, without mention of misadventure at the time of the procedure: Secondary | ICD-10-CM | POA: Diagnosis not present

## 2020-08-07 DIAGNOSIS — F419 Anxiety disorder, unspecified: Secondary | ICD-10-CM | POA: Diagnosis present

## 2020-08-07 DIAGNOSIS — F17210 Nicotine dependence, cigarettes, uncomplicated: Secondary | ICD-10-CM | POA: Diagnosis present

## 2020-08-07 DIAGNOSIS — Z90710 Acquired absence of both cervix and uterus: Secondary | ICD-10-CM

## 2020-08-07 DIAGNOSIS — Z91048 Other nonmedicinal substance allergy status: Secondary | ICD-10-CM

## 2020-08-07 DIAGNOSIS — I779 Disorder of arteries and arterioles, unspecified: Secondary | ICD-10-CM | POA: Diagnosis present

## 2020-08-07 DIAGNOSIS — K660 Peritoneal adhesions (postprocedural) (postinfection): Secondary | ICD-10-CM | POA: Diagnosis present

## 2020-08-07 DIAGNOSIS — Z9884 Bariatric surgery status: Secondary | ICD-10-CM

## 2020-08-07 DIAGNOSIS — Z880 Allergy status to penicillin: Secondary | ICD-10-CM

## 2020-08-07 DIAGNOSIS — Z7902 Long term (current) use of antithrombotics/antiplatelets: Secondary | ICD-10-CM

## 2020-08-07 DIAGNOSIS — Z794 Long term (current) use of insulin: Secondary | ICD-10-CM | POA: Diagnosis not present

## 2020-08-07 DIAGNOSIS — Z9081 Acquired absence of spleen: Secondary | ICD-10-CM

## 2020-08-07 DIAGNOSIS — Z95828 Presence of other vascular implants and grafts: Secondary | ICD-10-CM

## 2020-08-07 DIAGNOSIS — Z881 Allergy status to other antibiotic agents status: Secondary | ICD-10-CM | POA: Diagnosis not present

## 2020-08-07 DIAGNOSIS — Z20822 Contact with and (suspected) exposure to covid-19: Secondary | ICD-10-CM | POA: Diagnosis present

## 2020-08-07 DIAGNOSIS — E039 Hypothyroidism, unspecified: Secondary | ICD-10-CM | POA: Diagnosis present

## 2020-08-07 DIAGNOSIS — Z9889 Other specified postprocedural states: Secondary | ICD-10-CM

## 2020-08-07 DIAGNOSIS — Z882 Allergy status to sulfonamides status: Secondary | ICD-10-CM

## 2020-08-07 DIAGNOSIS — Z79890 Hormone replacement therapy (postmenopausal): Secondary | ICD-10-CM

## 2020-08-07 DIAGNOSIS — Z90410 Acquired total absence of pancreas: Secondary | ICD-10-CM

## 2020-08-07 DIAGNOSIS — I998 Other disorder of circulatory system: Secondary | ICD-10-CM

## 2020-08-07 DIAGNOSIS — S81801A Unspecified open wound, right lower leg, initial encounter: Secondary | ICD-10-CM

## 2020-08-07 DIAGNOSIS — Z79899 Other long term (current) drug therapy: Secondary | ICD-10-CM

## 2020-08-07 DIAGNOSIS — Z91013 Allergy to seafood: Secondary | ICD-10-CM

## 2020-08-07 DIAGNOSIS — Z9102 Food additives allergy status: Secondary | ICD-10-CM

## 2020-08-07 DIAGNOSIS — Z9641 Presence of insulin pump (external) (internal): Secondary | ICD-10-CM | POA: Diagnosis present

## 2020-08-07 DIAGNOSIS — K219 Gastro-esophageal reflux disease without esophagitis: Secondary | ICD-10-CM | POA: Diagnosis present

## 2020-08-07 DIAGNOSIS — Z9049 Acquired absence of other specified parts of digestive tract: Secondary | ICD-10-CM

## 2020-08-07 DIAGNOSIS — L7632 Postprocedural hematoma of skin and subcutaneous tissue following other procedure: Secondary | ICD-10-CM | POA: Diagnosis not present

## 2020-08-07 DIAGNOSIS — R112 Nausea with vomiting, unspecified: Secondary | ICD-10-CM | POA: Diagnosis not present

## 2020-08-07 DIAGNOSIS — I7409 Other arterial embolism and thrombosis of abdominal aorta: Secondary | ICD-10-CM | POA: Diagnosis present

## 2020-08-07 DIAGNOSIS — Z888 Allergy status to other drugs, medicaments and biological substances status: Secondary | ICD-10-CM

## 2020-08-07 DIAGNOSIS — L97519 Non-pressure chronic ulcer of other part of right foot with unspecified severity: Secondary | ICD-10-CM | POA: Diagnosis present

## 2020-08-07 DIAGNOSIS — E10621 Type 1 diabetes mellitus with foot ulcer: Secondary | ICD-10-CM | POA: Diagnosis present

## 2020-08-07 DIAGNOSIS — Z931 Gastrostomy status: Secondary | ICD-10-CM | POA: Diagnosis not present

## 2020-08-07 HISTORY — PX: AORTA - BILATERAL FEMORAL ARTERY BYPASS GRAFT: SHX1175

## 2020-08-07 LAB — PROTIME-INR
INR: 1.3 — ABNORMAL HIGH (ref 0.8–1.2)
Prothrombin Time: 15.4 seconds — ABNORMAL HIGH (ref 11.4–15.2)

## 2020-08-07 LAB — POCT I-STAT, CHEM 8
BUN: 7 mg/dL (ref 6–20)
BUN: 6 mg/dL (ref 6–20)
BUN: 6 mg/dL (ref 6–20)
BUN: 7 mg/dL (ref 6–20)
Calcium, Ion: 1.18 mmol/L (ref 1.15–1.40)
Calcium, Ion: 1.19 mmol/L (ref 1.15–1.40)
Calcium, Ion: 1.16 mmol/L (ref 1.15–1.40)
Calcium, Ion: 1.26 mmol/L (ref 1.15–1.40)
Chloride: 104 mmol/L (ref 98–111)
Chloride: 102 mmol/L (ref 98–111)
Chloride: 104 mmol/L (ref 98–111)
Chloride: 107 mmol/L (ref 98–111)
Creatinine, Ser: 0.4 mg/dL — ABNORMAL LOW (ref 0.44–1.00)
Creatinine, Ser: 0.5 mg/dL (ref 0.44–1.00)
Creatinine, Ser: 0.4 mg/dL — ABNORMAL LOW (ref 0.44–1.00)
Creatinine, Ser: 0.5 mg/dL (ref 0.44–1.00)
Glucose, Bld: 242 mg/dL — ABNORMAL HIGH (ref 70–99)
Glucose, Bld: 300 mg/dL — ABNORMAL HIGH (ref 70–99)
Glucose, Bld: 261 mg/dL — ABNORMAL HIGH (ref 70–99)
Glucose, Bld: 190 mg/dL — ABNORMAL HIGH (ref 70–99)
HCT: 36 % (ref 36.0–46.0)
HCT: 38 % (ref 36.0–46.0)
HCT: 34 % — ABNORMAL LOW (ref 36.0–46.0)
HCT: 31 % — ABNORMAL LOW (ref 36.0–46.0)
Hemoglobin: 12.2 g/dL (ref 12.0–15.0)
Hemoglobin: 12.9 g/dL (ref 12.0–15.0)
Hemoglobin: 11.6 g/dL — ABNORMAL LOW (ref 12.0–15.0)
Hemoglobin: 10.5 g/dL — ABNORMAL LOW (ref 12.0–15.0)
Potassium: 4.4 mmol/L (ref 3.5–5.1)
Potassium: 4.4 mmol/L (ref 3.5–5.1)
Potassium: 3.5 mmol/L (ref 3.5–5.1)
Potassium: 3.5 mmol/L (ref 3.5–5.1)
Sodium: 139 mmol/L (ref 135–145)
Sodium: 136 mmol/L (ref 135–145)
Sodium: 139 mmol/L (ref 135–145)
Sodium: 140 mmol/L (ref 135–145)
TCO2: 29 mmol/L (ref 22–32)
TCO2: 27 mmol/L (ref 22–32)
TCO2: 25 mmol/L (ref 22–32)
TCO2: 24 mmol/L (ref 22–32)

## 2020-08-07 LAB — BLOOD GAS, ARTERIAL
Acid-base deficit: 1 mmol/L (ref 0.0–2.0)
Bicarbonate: 24.7 mmol/L (ref 20.0–28.0)
FIO2: 40
O2 Saturation: 99.2 %
Patient temperature: 36.2
pCO2 arterial: 50.2 mmHg — ABNORMAL HIGH (ref 32.0–48.0)
pH, Arterial: 7.307 — ABNORMAL LOW (ref 7.350–7.450)
pO2, Arterial: 211 mmHg — ABNORMAL HIGH (ref 83.0–108.0)

## 2020-08-07 LAB — BASIC METABOLIC PANEL
Anion gap: 8 (ref 5–15)
BUN: 7 mg/dL (ref 6–20)
CO2: 22 mmol/L (ref 22–32)
Calcium: 8.3 mg/dL — ABNORMAL LOW (ref 8.9–10.3)
Chloride: 109 mmol/L (ref 98–111)
Creatinine, Ser: 0.7 mg/dL (ref 0.44–1.00)
GFR, Estimated: >60 mL/min (ref >60)
Glucose, Bld: 130 mg/dL — ABNORMAL HIGH (ref 70–99)
Potassium: 3.9 mmol/L (ref 3.5–5.1)
Sodium: 139 mmol/L (ref 135–145)

## 2020-08-07 LAB — POCT I-STAT 7, (LYTES, BLD GAS, ICA,H+H)
Acid-Base Excess: 5 mmol/L — ABNORMAL HIGH (ref 0.0–2.0)
Acid-Base Excess: 0 mmol/L (ref 0.0–2.0)
Bicarbonate: 30.5 mmol/L — ABNORMAL HIGH (ref 20.0–28.0)
Bicarbonate: 24.6 mmol/L (ref 20.0–28.0)
Calcium, Ion: 1.17 mmol/L (ref 1.15–1.40)
Calcium, Ion: 1.22 mmol/L (ref 1.15–1.40)
HCT: 37 % (ref 36.0–46.0)
HCT: 30 % — ABNORMAL LOW (ref 36.0–46.0)
Hemoglobin: 12.6 g/dL (ref 12.0–15.0)
Hemoglobin: 10.2 g/dL — ABNORMAL LOW (ref 12.0–15.0)
O2 Saturation: 100 %
O2 Saturation: 100 %
Potassium: 4.4 mmol/L (ref 3.5–5.1)
Potassium: 3.5 mmol/L (ref 3.5–5.1)
Sodium: 139 mmol/L (ref 135–145)
Sodium: 141 mmol/L (ref 135–145)
TCO2: 32 mmol/L (ref 22–32)
TCO2: 26 mmol/L (ref 22–32)
pCO2 arterial: 45.6 mmHg (ref 32.0–48.0)
pCO2 arterial: 40.7 mmHg (ref 32.0–48.0)
pH, Arterial: 7.434 (ref 7.350–7.450)
pH, Arterial: 7.389 (ref 7.350–7.450)
pO2, Arterial: 256 mmHg — ABNORMAL HIGH (ref 83.0–108.0)
pO2, Arterial: 275 mmHg — ABNORMAL HIGH (ref 83.0–108.0)

## 2020-08-07 LAB — POCT ACTIVATED CLOTTING TIME
Activated Clotting Time: 291 seconds
Activated Clotting Time: 142 seconds
Activated Clotting Time: 225 seconds

## 2020-08-07 LAB — GLUCOSE, CAPILLARY
Glucose-Capillary: 225 mg/dL — ABNORMAL HIGH (ref 70–99)
Glucose-Capillary: 137 mg/dL — ABNORMAL HIGH (ref 70–99)
Glucose-Capillary: 121 mg/dL — ABNORMAL HIGH (ref 70–99)
Glucose-Capillary: 124 mg/dL — ABNORMAL HIGH (ref 70–99)

## 2020-08-07 LAB — CBC
HCT: 33.8 % — ABNORMAL LOW (ref 36.0–46.0)
Hemoglobin: 11.3 g/dL — ABNORMAL LOW (ref 12.0–15.0)
MCH: 32.6 pg (ref 26.0–34.0)
MCHC: 33.4 g/dL (ref 30.0–36.0)
MCV: 97.4 fL (ref 80.0–100.0)
Platelets: 294 10*3/uL (ref 150–400)
RBC: 3.47 MIL/uL — ABNORMAL LOW (ref 3.87–5.11)
RDW: 13 % (ref 11.5–15.5)
WBC: 28.7 10*3/uL — ABNORMAL HIGH (ref 4.0–10.5)
nRBC: 0 % (ref 0.0–0.2)

## 2020-08-07 LAB — MAGNESIUM: Magnesium: 1.6 mg/dL — ABNORMAL LOW (ref 1.7–2.4)

## 2020-08-07 LAB — APTT: aPTT: 32 seconds (ref 24–36)

## 2020-08-07 LAB — ABO/RH: ABO/RH(D): B POS

## 2020-08-07 SURGERY — CREATION, BYPASS, ARTERIAL, AORTA TO FEMORAL, BILATERAL, USING GRAFT
Anesthesia: General | Site: Abdomen | Laterality: Bilateral

## 2020-08-07 MED ORDER — PROPOFOL 10 MG/ML IV BOLUS
INTRAVENOUS | Status: AC
  Filled 2020-08-07: qty 20

## 2020-08-07 MED ORDER — DIPHENHYDRAMINE HCL 50 MG/ML IJ SOLN: 12.5000 mg | Freq: Four times a day (QID) | INTRAMUSCULAR | Status: DC | PRN

## 2020-08-07 MED ORDER — HEPARIN SODIUM (PORCINE) 5000 UNIT/ML IJ SOLN
5000.0000 [IU] | Freq: Three times a day (TID) | INTRAMUSCULAR | Status: DC
  Administered 2020-08-08 – 2020-08-17 (×27): 5000 [IU] via SUBCUTANEOUS
  Filled 2020-08-07 (×27): qty 1

## 2020-08-07 MED ORDER — SODIUM CHLORIDE 0.9 % IV SOLN: INTRAVENOUS | Status: DC

## 2020-08-07 MED ORDER — HEPARIN SODIUM (PORCINE) 1000 UNIT/ML IJ SOLN
INTRAMUSCULAR | Status: DC | PRN
  Administered 2020-08-07: 7000 [IU] via INTRAVENOUS

## 2020-08-07 MED ORDER — SUGAMMADEX SODIUM 200 MG/2ML IV SOLN
INTRAVENOUS | Status: DC | PRN
  Administered 2020-08-07: 200 mg via INTRAVENOUS

## 2020-08-07 MED ORDER — HYDRALAZINE HCL 20 MG/ML IJ SOLN: 5.0000 mg | INTRAMUSCULAR | Status: DC | PRN

## 2020-08-07 MED ORDER — SCOPOLAMINE 1 MG/3DAYS TD PT72
1.0000 | MEDICATED_PATCH | Freq: Once | TRANSDERMAL | Status: DC
  Administered 2020-08-07: 1.5 mg via TRANSDERMAL
  Filled 2020-08-07: qty 1

## 2020-08-07 MED ORDER — HYDROMORPHONE 1 MG/ML IV SOLN
INTRAVENOUS | Status: DC
Start: 2020-08-07 — End: 2020-08-11
  Administered 2020-08-08: 3 mg via INTRAVENOUS
  Administered 2020-08-08: 2.1 mg via INTRAVENOUS
  Administered 2020-08-08: 1.8 mg via INTRAVENOUS
  Administered 2020-08-08: 0.6 mg via INTRAVENOUS
  Administered 2020-08-09: 1.5 mg via INTRAVENOUS
  Administered 2020-08-09: 2.7 mg via INTRAVENOUS
  Administered 2020-08-09: 3.6 mg via INTRAVENOUS
  Administered 2020-08-09: 1.8 mg via INTRAVENOUS
  Administered 2020-08-09: 2.4 mg via INTRAVENOUS
  Administered 2020-08-09: 30 mg via INTRAVENOUS
  Administered 2020-08-09: 4.2 mg via INTRAVENOUS
  Administered 2020-08-09: 3.9 mg via INTRAVENOUS
  Administered 2020-08-10: 2.7 mg via INTRAVENOUS
  Administered 2020-08-10: 3 mg via INTRAVENOUS
  Administered 2020-08-10: 30 mg via INTRAVENOUS
  Administered 2020-08-10 (×2): 3 mg via INTRAVENOUS
  Administered 2020-08-11: 1.8 mL via INTRAVENOUS
  Administered 2020-08-11: 1.5 mg via INTRAVENOUS
  Filled 2020-08-07 (×2): qty 30

## 2020-08-07 MED ORDER — DEXTROSE 50 % IV SOLN: 0.0000 mL | INTRAVENOUS | Status: DC | PRN

## 2020-08-07 MED ORDER — DIPHENHYDRAMINE HCL 50 MG/ML IJ SOLN
12.5000 mg | Freq: Two times a day (BID) | INTRAMUSCULAR | Status: AC
  Administered 2020-08-07 – 2020-08-08 (×2): 12.5 mg via INTRAVENOUS
  Filled 2020-08-07 (×2): qty 1

## 2020-08-07 MED ORDER — PROPOFOL 1000 MG/100ML IV EMUL
INTRAVENOUS | Status: AC
  Filled 2020-08-07: qty 100

## 2020-08-07 MED ORDER — 0.9 % SODIUM CHLORIDE (POUR BTL) OPTIME
TOPICAL | Status: DC | PRN
  Administered 2020-08-07 (×2): 1000 mL

## 2020-08-07 MED ORDER — PROPOFOL 500 MG/50ML IV EMUL
INTRAVENOUS | Status: DC | PRN
  Administered 2020-08-07: 20 ug/kg/min via INTRAVENOUS

## 2020-08-07 MED ORDER — ROCURONIUM BROMIDE 10 MG/ML (PF) SYRINGE
PREFILLED_SYRINGE | INTRAVENOUS | Status: AC
  Filled 2020-08-07: qty 10

## 2020-08-07 MED ORDER — SODIUM CHLORIDE 0.9 % IV SOLN
INTRAVENOUS | Status: AC
  Filled 2020-08-07: qty 1.2

## 2020-08-07 MED ORDER — BISACODYL 10 MG RE SUPP
10.0000 mg | Freq: Every day | RECTAL | Status: DC | PRN
  Administered 2020-08-11 – 2020-08-12 (×2): 10 mg via RECTAL
  Filled 2020-08-07 (×2): qty 1

## 2020-08-07 MED ORDER — LIDOCAINE 2% (20 MG/ML) 5 ML SYRINGE
INTRAMUSCULAR | Status: DC | PRN
  Administered 2020-08-07: 80 mg via INTRAVENOUS

## 2020-08-07 MED ORDER — DIPHENHYDRAMINE HCL 12.5 MG/5ML PO ELIX
12.5000 mg | ORAL_SOLUTION | Freq: Four times a day (QID) | ORAL | Status: DC | PRN
  Filled 2020-08-07: qty 5

## 2020-08-07 MED ORDER — FENTANYL CITRATE (PF) 100 MCG/2ML IJ SOLN
INTRAMUSCULAR | Status: AC
  Administered 2020-08-07: 50 ug via INTRAVENOUS
  Filled 2020-08-07: qty 2

## 2020-08-07 MED ORDER — HEMOSTATIC AGENTS (NO CHARGE) OPTIME
TOPICAL | Status: DC | PRN
  Administered 2020-08-07: 1 via TOPICAL

## 2020-08-07 MED ORDER — ONDANSETRON HCL 4 MG/2ML IJ SOLN
INTRAMUSCULAR | Status: AC
  Filled 2020-08-07: qty 2

## 2020-08-07 MED ORDER — ORAL CARE MOUTH RINSE: 15.0000 mL | Freq: Once | OROMUCOSAL | Status: AC

## 2020-08-07 MED ORDER — DEXAMETHASONE SODIUM PHOSPHATE 10 MG/ML IJ SOLN
INTRAMUSCULAR | Status: DC | PRN
  Administered 2020-08-07: 5 mg via INTRAVENOUS

## 2020-08-07 MED ORDER — PROTAMINE SULFATE 10 MG/ML IV SOLN
INTRAVENOUS | Status: DC | PRN
  Administered 2020-08-07: 50 mg via INTRAVENOUS

## 2020-08-07 MED ORDER — ACETAMINOPHEN 325 MG RE SUPP: 325.0000 mg | RECTAL | Status: DC | PRN

## 2020-08-07 MED ORDER — PROMETHAZINE HCL 25 MG/ML IJ SOLN
INTRAMUSCULAR | Status: AC
  Administered 2020-08-07: 12.5 mg via INTRAVENOUS
  Filled 2020-08-07: qty 1

## 2020-08-07 MED ORDER — LACTATED RINGERS IV SOLN: INTRAVENOUS | Status: DC

## 2020-08-07 MED ORDER — METOPROLOL TARTRATE 5 MG/5ML IV SOLN
2.5000 mg | Freq: Four times a day (QID) | INTRAVENOUS | Status: DC
  Administered 2020-08-07 – 2020-08-16 (×34): 2.5 mg via INTRAVENOUS
  Filled 2020-08-07 (×35): qty 5

## 2020-08-07 MED ORDER — DEXAMETHASONE SODIUM PHOSPHATE 10 MG/ML IJ SOLN
INTRAMUSCULAR | Status: AC
  Filled 2020-08-07: qty 1

## 2020-08-07 MED ORDER — LABETALOL HCL 5 MG/ML IV SOLN: 10.0000 mg | INTRAVENOUS | Status: DC | PRN

## 2020-08-07 MED ORDER — DIPHENHYDRAMINE HCL 50 MG/ML IJ SOLN
INTRAMUSCULAR | Status: DC | PRN
  Administered 2020-08-07: 12.5 mg via INTRAVENOUS

## 2020-08-07 MED ORDER — PHENOL 1.4 % MT LIQD: 1.0000 | OROMUCOSAL | Status: DC | PRN

## 2020-08-07 MED ORDER — INSULIN REGULAR(HUMAN) IN NACL 100-0.9 UT/100ML-% IV SOLN
INTRAVENOUS | Status: DC
  Administered 2020-08-07: 0.8 [IU]/h via INTRAVENOUS
  Administered 2020-08-10: 0.2 [IU]/h via INTRAVENOUS
  Filled 2020-08-07: qty 100

## 2020-08-07 MED ORDER — MIDAZOLAM HCL 2 MG/2ML IJ SOLN
INTRAMUSCULAR | Status: AC
  Filled 2020-08-07: qty 2

## 2020-08-07 MED ORDER — PHENYLEPHRINE HCL-NACL 10-0.9 MG/250ML-% IV SOLN
INTRAVENOUS | Status: DC | PRN
  Administered 2020-08-07: 40 ug/min via INTRAVENOUS

## 2020-08-07 MED ORDER — HEPARIN SODIUM (PORCINE) 1000 UNIT/ML IJ SOLN
INTRAMUSCULAR | Status: AC
  Filled 2020-08-07: qty 1

## 2020-08-07 MED ORDER — PROMETHAZINE HCL 25 MG/ML IJ SOLN
6.2500 mg | INTRAMUSCULAR | Status: DC | PRN
Start: 2020-08-07 — End: 2020-08-07

## 2020-08-07 MED ORDER — FENTANYL CITRATE (PF) 250 MCG/5ML IJ SOLN
INTRAMUSCULAR | Status: DC | PRN
  Administered 2020-08-07 (×5): 50 ug via INTRAVENOUS
  Administered 2020-08-07: 100 ug via INTRAVENOUS

## 2020-08-07 MED ORDER — FENTANYL CITRATE (PF) 250 MCG/5ML IJ SOLN
INTRAMUSCULAR | Status: AC
  Filled 2020-08-07: qty 5

## 2020-08-07 MED ORDER — ONDANSETRON HCL 4 MG/2ML IJ SOLN
4.0000 mg | Freq: Four times a day (QID) | INTRAMUSCULAR | Status: DC | PRN
  Administered 2020-08-07 – 2020-08-11 (×6): 4 mg via INTRAVENOUS
  Filled 2020-08-07 (×6): qty 2

## 2020-08-07 MED ORDER — METOPROLOL TARTRATE 5 MG/5ML IV SOLN: 2.0000 mg | INTRAVENOUS | Status: DC | PRN

## 2020-08-07 MED ORDER — POLYETHYLENE GLYCOL 3350 17 G PO PACK
17.0000 g | PACK | Freq: Every day | ORAL | Status: DC | PRN
  Administered 2020-08-12 – 2020-08-13 (×2): 17 g via ORAL
  Filled 2020-08-07 (×2): qty 1

## 2020-08-07 MED ORDER — PHENYLEPHRINE 40 MCG/ML (10ML) SYRINGE FOR IV PUSH (FOR BLOOD PRESSURE SUPPORT)
PREFILLED_SYRINGE | INTRAVENOUS | Status: AC
  Filled 2020-08-07: qty 10

## 2020-08-07 MED ORDER — DOCUSATE SODIUM 100 MG PO CAPS
100.0000 mg | ORAL_CAPSULE | Freq: Every day | ORAL | Status: DC
  Administered 2020-08-08 – 2020-08-13 (×6): 100 mg via ORAL
  Filled 2020-08-07 (×7): qty 1

## 2020-08-07 MED ORDER — CHLORHEXIDINE GLUCONATE CLOTH 2 % EX PADS: 6.0000 | MEDICATED_PAD | Freq: Once | CUTANEOUS | Status: DC

## 2020-08-07 MED ORDER — ACETAMINOPHEN 325 MG PO TABS: 325.0000 mg | ORAL_TABLET | ORAL | Status: DC | PRN

## 2020-08-07 MED ORDER — POTASSIUM CHLORIDE CRYS ER 20 MEQ PO TBCR: 20.0000 meq | EXTENDED_RELEASE_TABLET | Freq: Once | ORAL | Status: DC | PRN

## 2020-08-07 MED ORDER — MIDAZOLAM HCL 2 MG/2ML IJ SOLN
INTRAMUSCULAR | Status: DC | PRN
  Administered 2020-08-07: 2 mg via INTRAVENOUS

## 2020-08-07 MED ORDER — CALCIUM CHLORIDE 10 % IV SOLN
INTRAVENOUS | Status: DC | PRN
  Administered 2020-08-07 (×2): 200 mg via INTRAVENOUS

## 2020-08-07 MED ORDER — DIPHENHYDRAMINE HCL 50 MG/ML IJ SOLN
INTRAMUSCULAR | Status: AC
  Filled 2020-08-07: qty 1

## 2020-08-07 MED ORDER — LIDOCAINE 2% (20 MG/ML) 5 ML SYRINGE
INTRAMUSCULAR | Status: AC
  Filled 2020-08-07: qty 5

## 2020-08-07 MED ORDER — ROCURONIUM BROMIDE 10 MG/ML (PF) SYRINGE
PREFILLED_SYRINGE | INTRAVENOUS | Status: DC | PRN
  Administered 2020-08-07: 60 mg via INTRAVENOUS
  Administered 2020-08-07 (×3): 20 mg via INTRAVENOUS

## 2020-08-07 MED ORDER — SODIUM CHLORIDE 0.9 % IV SOLN: 500.0000 mL | Freq: Once | INTRAVENOUS | Status: DC | PRN

## 2020-08-07 MED ORDER — EPINEPHRINE 0.3 MG/0.3ML IJ SOAJ: 0.3000 mg | Freq: Every day | INTRAMUSCULAR | Status: DC | PRN

## 2020-08-07 MED ORDER — FENTANYL CITRATE (PF) 100 MCG/2ML IJ SOLN
25.0000 ug | INTRAMUSCULAR | Status: DC | PRN
  Administered 2020-08-07: 50 ug via INTRAVENOUS

## 2020-08-07 MED ORDER — ALBUMIN HUMAN 5 % IV SOLN: INTRAVENOUS | Status: DC | PRN

## 2020-08-07 MED ORDER — PROTAMINE SULFATE 10 MG/ML IV SOLN
INTRAVENOUS | Status: AC
  Filled 2020-08-07: qty 5

## 2020-08-07 MED ORDER — SODIUM CHLORIDE 0.9 % IV SOLN
INTRAVENOUS | Status: DC | PRN
  Administered 2020-08-07: 500 mL

## 2020-08-07 MED ORDER — VANCOMYCIN HCL 1000 MG/200ML IV SOLN
1000.0000 mg | Freq: Once | INTRAVENOUS | Status: AC
  Administered 2020-08-07: 1000 mg via INTRAVENOUS

## 2020-08-07 MED ORDER — NALOXONE HCL 0.4 MG/ML IJ SOLN: 0.4000 mg | INTRAMUSCULAR | Status: DC | PRN

## 2020-08-07 MED ORDER — CHLORHEXIDINE GLUCONATE CLOTH 2 % EX PADS
6.0000 | MEDICATED_PAD | Freq: Every day | CUTANEOUS | Status: DC
  Administered 2020-08-09 – 2020-08-17 (×7): 6 via TOPICAL

## 2020-08-07 MED ORDER — SODIUM CHLORIDE 0.9% FLUSH: 9.0000 mL | INTRAVENOUS | Status: DC | PRN

## 2020-08-07 MED ORDER — INSULIN REGULAR(HUMAN) IN NACL 100-0.9 UT/100ML-% IV SOLN
INTRAVENOUS | Status: DC | PRN
  Administered 2020-08-07: 12 [IU]/h via INTRAVENOUS

## 2020-08-07 MED ORDER — VANCOMYCIN HCL 1000 MG/200ML IV SOLN
1000.0000 mg | Freq: Two times a day (BID) | INTRAVENOUS | Status: AC
  Administered 2020-08-07 – 2020-08-08 (×2): 1000 mg via INTRAVENOUS
  Filled 2020-08-07 (×2): qty 200

## 2020-08-07 MED ORDER — CHLORHEXIDINE GLUCONATE 0.12 % MT SOLN
15.0000 mL | Freq: Once | OROMUCOSAL | Status: AC
  Administered 2020-08-07: 15 mL via OROMUCOSAL
  Filled 2020-08-07: qty 15

## 2020-08-07 MED ORDER — MAGNESIUM SULFATE 2 GM/50ML IV SOLN
2.0000 g | Freq: Once | INTRAVENOUS | Status: AC | PRN
  Administered 2020-08-07: 2 g via INTRAVENOUS
  Filled 2020-08-07: qty 50

## 2020-08-07 MED ORDER — KETAMINE HCL 10 MG/ML IJ SOLN
INTRAMUSCULAR | Status: DC | PRN
  Administered 2020-08-07: 30 mg via INTRAVENOUS
  Administered 2020-08-07: 20 mg via INTRAVENOUS

## 2020-08-07 MED ORDER — ACETAMINOPHEN 500 MG PO TABS
1000.0000 mg | ORAL_TABLET | Freq: Once | ORAL | Status: AC
  Administered 2020-08-07: 1000 mg via ORAL
  Filled 2020-08-07: qty 2

## 2020-08-07 MED ORDER — PANTOPRAZOLE SODIUM 40 MG IV SOLR
40.0000 mg | Freq: Every day | INTRAVENOUS | Status: DC
  Administered 2020-08-07 – 2020-08-11 (×5): 40 mg via INTRAVENOUS
  Filled 2020-08-07 (×5): qty 40

## 2020-08-07 MED ORDER — HYDROMORPHONE 1 MG/ML IV SOLN
INTRAVENOUS | Status: AC
  Administered 2020-08-07: 30 mg via INTRAVENOUS
  Filled 2020-08-07: qty 30

## 2020-08-07 MED ORDER — ONDANSETRON HCL 4 MG/2ML IJ SOLN
INTRAMUSCULAR | Status: DC | PRN
  Administered 2020-08-07: 4 mg via INTRAVENOUS

## 2020-08-07 MED ORDER — KETAMINE HCL 50 MG/5ML IJ SOSY
PREFILLED_SYRINGE | INTRAMUSCULAR | Status: AC
  Filled 2020-08-07: qty 10

## 2020-08-07 MED ORDER — PROPOFOL 10 MG/ML IV BOLUS
INTRAVENOUS | Status: DC | PRN
  Administered 2020-08-07: 50 mg via INTRAVENOUS

## 2020-08-07 MED ORDER — CALCIUM CHLORIDE 10 % IV SOLN
INTRAVENOUS | Status: AC
  Filled 2020-08-07: qty 10

## 2020-08-07 SURGICAL SUPPLY — 49 items
CANISTER SUCT 3000ML PPV (MISCELLANEOUS) ×3 IMPLANT
CLIP VESOCCLUDE MED 24/CT (CLIP) ×3 IMPLANT
CLIP VESOCCLUDE SM WIDE 24/CT (CLIP) ×3 IMPLANT
DERMABOND ADVANCED (GAUZE/BANDAGES/DRESSINGS) ×4
DERMABOND ADVANCED .7 DNX12 (GAUZE/BANDAGES/DRESSINGS) ×2 IMPLANT
ELECT BLADE 4.0 EZ CLEAN MEGAD (MISCELLANEOUS) ×3
ELECT BLADE 6.5 EXT (BLADE) ×3 IMPLANT
ELECT REM PT RETURN 9FT ADLT (ELECTROSURGICAL) ×3
ELECTRODE BLDE 4.0 EZ CLN MEGD (MISCELLANEOUS) ×1 IMPLANT
ELECTRODE REM PT RTRN 9FT ADLT (ELECTROSURGICAL) ×1 IMPLANT
FELT TEFLON 1X6 (MISCELLANEOUS) IMPLANT
GLOVE BIO SURGEON STRL SZ7.5 (GLOVE) ×3 IMPLANT
GOWN STRL REUS W/ TWL LRG LVL3 (GOWN DISPOSABLE) ×2 IMPLANT
GOWN STRL REUS W/ TWL XL LVL3 (GOWN DISPOSABLE) ×1 IMPLANT
GOWN STRL REUS W/TWL LRG LVL3 (GOWN DISPOSABLE) ×4
GOWN STRL REUS W/TWL XL LVL3 (GOWN DISPOSABLE) ×2
GRAFT HEMASHIELD 14X7MM (Vascular Products) ×3 IMPLANT
GRAFT HEMASHIELD 16MM (Vascular Products) ×3 IMPLANT
HEMOSTAT SNOW SURGICEL 2X4 (HEMOSTASIS) ×3 IMPLANT
INSERT FOGARTY 61MM (MISCELLANEOUS) IMPLANT
INSERT FOGARTY SM (MISCELLANEOUS) ×6 IMPLANT
KIT BASIN OR (CUSTOM PROCEDURE TRAY) ×3 IMPLANT
KIT TURNOVER KIT B (KITS) ×3 IMPLANT
NS IRRIG 1000ML POUR BTL (IV SOLUTION) ×6 IMPLANT
PACK AORTA (CUSTOM PROCEDURE TRAY) ×3 IMPLANT
PAD ARMBOARD 7.5X6 YLW CONV (MISCELLANEOUS) ×6 IMPLANT
RETAINER VISCERA MED (MISCELLANEOUS) ×3 IMPLANT
SUT MNCRL AB 4-0 PS2 18 (SUTURE) ×9 IMPLANT
SUT PDS AB 1 TP1 54 (SUTURE) ×6 IMPLANT
SUT PROLENE 3 0 SH 48 (SUTURE) ×6 IMPLANT
SUT PROLENE 5 0 C 1 24 (SUTURE) ×9 IMPLANT
SUT PROLENE 5 0 C 1 36 (SUTURE) ×6 IMPLANT
SUT SILK 2 0 (SUTURE) ×2
SUT SILK 2 0 TIES 17X18 (SUTURE) ×2
SUT SILK 2 0SH CR/8 30 (SUTURE) ×3 IMPLANT
SUT SILK 2-0 18XBRD TIE 12 (SUTURE) ×1 IMPLANT
SUT SILK 2-0 18XBRD TIE BLK (SUTURE) ×1 IMPLANT
SUT SILK 3 0 (SUTURE) ×2
SUT SILK 3 0 TIES 17X18 (SUTURE) ×2
SUT SILK 3-0 18XBRD TIE 12 (SUTURE) ×1 IMPLANT
SUT SILK 3-0 18XBRD TIE BLK (SUTURE) ×1 IMPLANT
SUT VIC AB 2-0 CT1 27 (SUTURE) ×10
SUT VIC AB 2-0 CT1 TAPERPNT 27 (SUTURE) ×5 IMPLANT
SUT VIC AB 3-0 SH 27 (SUTURE) ×6
SUT VIC AB 3-0 SH 27X BRD (SUTURE) ×3 IMPLANT
TOWEL GREEN STERILE (TOWEL DISPOSABLE) ×9 IMPLANT
TOWEL SURG RFD BLUE STRL DISP (DISPOSABLE) ×3 IMPLANT
TRAY FOLEY MTR SLVR 16FR STAT (SET/KITS/TRAYS/PACK) ×3 IMPLANT
WATER STERILE IRR 1000ML POUR (IV SOLUTION) ×6 IMPLANT

## 2020-08-07 NOTE — Progress Notes (Signed)
Inpatient Diabetes Program Recommendations  AACE/ADA: New Consensus Statement on Inpatient Glycemic Control (2015)  Target Ranges:  Prepandial:   less than 140 mg/dL      Peak postprandial:   less than 180 mg/dL (1-2 hours)      Critically ill patients:  140 - 180 mg/dL   Lab Results  Component Value Date   GLUCAP 124 (H) 08/07/2020   HGBA1C 8.5 (H) 08/05/2020    Review of Glycemic Control Results for Jessica [name], Jessica [name] (MRN [id number]) as of 08/07/2020 14:54  Ref. Range 08/07/2020 12:18 08/07/2020 13:04 08/07/2020 14:05  Glucose-Capillary Latest Ref Range: 70 - 99 mg/dL 203 (H) 559 (H) 741 (H)    Outpatient Diabetes medications: insulin pump Medtronic 670G Current orders for Inpatient glycemic control: IV insulin  Inpatient Diabetes Program Recommendations:    Spoke with patient and significant other briefly upon arrival from PACU. Patient remains groggy and unable to answers questions. Thus, prohibiting independent management and application of insulin pump at this time. Patient has all necessary supplies at bedside once appropriate.  Would recommend continuing with IV insulin until AM and reassess on 2/18. Secure chat sent to MD.   Thanks, Lujean Rave, MSN, RNC-OB Diabetes Coordinator [phone] (8a-5p)

## 2020-08-07 NOTE — Progress Notes (Signed)
  Day of Surgery Note    Subjective:  Sleepy in recovery   Vitals:   08/07/20 1218 08/07/20 1233  BP: 138/78 135/77  Pulse: 84 83  Resp: 20 20  Temp: 97.6 F (36.4 C)   SpO2: 100% 100%    Incisions:   Laparotomy incision and bilateral groin incisions are clean and dry Extremities:  Easily palpable bilateral DP/PT pulses; bilateral feet warm and well perfused. Cardiac:  regular Lungs:  Extubated-non labored Abdomen:  Soft; G-tube in place   Assessment/Plan:  This is a 45 y.o. female who is s/p  S/p aortobifemoral bypass grafting  -pt doing well in recovery with palpable DP/PT pulses bilaterally -pt has insulin pump-she is currently on insulin gtt.  DM coordinator coming to restart her own insulin pump. -metoprolol 2.5mg  q6h with parameters started post op -G-tube to gravity  -PCA for pain control -pt has vancomycin allergy.  She will be given this post op with benadryl 12.5mg  IV with Zofran 4mg  IV with her periop doses of Vanc.  This was given in the OR without respiratory issues or rash/hives. -to 2 CVICU later this afternoon.   , PA-C 08/07/2020 12:38 PM [phone]

## 2020-08-07 NOTE — Anesthesia Postprocedure Evaluation (Signed)
Anesthesia Post Note  Patient: Jessica [name]  Procedure(s) Performed: AORTA BIFEMORAL BYPASS GRAFTING (Bilateral Abdomen)     Patient location during evaluation: PACU Anesthesia Type: General Level of consciousness: awake and alert, awake and oriented Pain management: pain level controlled Vital Signs Assessment: post-procedure vital signs reviewed and stable Respiratory status: spontaneous breathing, nonlabored ventilation and respiratory function stable Cardiovascular status: blood pressure returned to baseline and stable Postop Assessment: no apparent nausea or vomiting Anesthetic complications: no   No complications documented.  Last Vitals:  Vitals:   08/07/20 1335 08/07/20 1350  BP:  129/81  Pulse:  87  Resp: 18 12  Temp:    SpO2: 95% 97%    Last Pain:  Vitals:   08/07/20 1355  TempSrc:   PainSc: 0-No pain                 Cecile Hearing

## 2020-08-07 NOTE — H&P (Signed)
HPI:  Jessica [name] is a 45 y.o. female previous history of right common iliac artery stenting on 2 occasions previous left femoropopliteal bypass performed in [place].  I have also intervened on the femoropopliteal bypass and the remote past.  She has a history of a total pancreatectomy with subsequent Roux-en-Y bypass and gastrojejunostomy tube.  Recently she had stopped her Plavix to have her tube exchanged.  She now follows up for skin changes on her right lateral foot with pain.  She does have pain with walking does not have frank claudication does not have frank tissue loss or ulceration.  She did quit smoking 47 days ago.      Past Medical History:  Diagnosis Date  . ADHD (attention deficit hyperactivity disorder)   . Anxiety   . Diabetes mellitus type I (HCC)   . Diverticulosis of colon without hemorrhage   . DM (diabetes mellitus) (HCC)    post pancreatectomy  . Feeding by G-tube (HCC)   . GERD (gastroesophageal reflux disease)   . Hashimoto's thyroiditis    euthyroid  . Hypercholesterolemia   . PONV (postoperative nausea and vomiting)         Family History  Problem Relation Age of Onset  . Pancreatic cancer Paternal Grandfather   . Cirrhosis Paternal Grandfather   . Pancreatitis Other        cousin  . Throat cancer Mother   . Depression Father   . Colon cancer Neg Hx         Past Surgical History:  Procedure Laterality Date  . ABDOMINAL AORTOGRAM W/LOWER EXTREMITY N/A 04/27/2017   Procedure: ABDOMINAL AORTOGRAM W/LOWER EXTREMITY;  Surgeon: Maeola Harman, MD;  Location: [facility] INVASIVE CV LAB;  Service: Cardiovascular;  Laterality: N/A;  . ABDOMINAL AORTOGRAM W/LOWER EXTREMITY Bilateral 06/04/2019   Procedure: ABDOMINAL AORTOGRAM W/LOWER EXTREMITY;  Surgeon: Maeola Harman, MD;  Location: [facility] INVASIVE CV LAB;  Service: Cardiovascular;  Laterality: Bilateral;  . ABDOMINAL AORTOGRAM W/LOWER EXTREMITY N/A 11/26/2019    Procedure: ABDOMINAL AORTOGRAM W/ Right LOWER EXTREMITY Runoff;  Surgeon: Maeola Harman, MD;  Location: [facility] INVASIVE CV LAB;  Service: Cardiovascular;  Laterality: N/A;  . ABDOMINAL HYSTERECTOMY    . BIOPSY N/A 05/15/2014   Procedure: GASTIC,  ASCENDING, AND DESCEDNING/SIGMOID  COLON BIOPSY;  Surgeon: Corbin Ade, MD;  Location: [facility];  Service: Endoscopy;  Laterality: N/A;  . CHOLECYSTECTOMY    . COLONOSCOPY WITH PROPOFOL N/A 05/15/2014   Dr. Jena Gauss (primary GI is Dr. Darrick Penna). Hemorrhoids, negative random colon biopsies. GI pathogen panel negative. Diverticulosis.  [name] complete hysterectomy    . ESOPHAGOGASTRODUODENOSCOPY (EGD) WITH PROPOFOL N/A 05/15/2014   Dr. Jena Gauss (primary GI Dr. Darrick Penna), Billroth II, inflamed residual gastric mucosa, benign biopsies  . FEMORAL-POPLITEAL BYPASS GRAFT    . 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  [name] KNEE SURGERY Right    X 6-5 arthroscopies and open procedure 1 time- tighten patellar tendon and loose IT band  . 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  [name] PERIPHERAL VASCULAR INTERVENTION Right 06/04/2019   Procedure: PERIPHERAL VASCULAR INTERVENTION;  Surgeon: Maeola Harman, MD;  Location: [facility] INVASIVE CV LAB;  Service: Cardiovascular;  Laterality: Right;  common iliac  . PERIPHERAL VASCULAR INTERVENTION Right 11/26/2019   Procedure: PERIPHERAL VASCULAR INTERVENTION;  Surgeon: Maeola Harman, MD;  Location: [facility] INVASIVE CV LAB;  Service: Cardiovascular;  Laterality: Right;  Iliac     Short Social History:  Social History        Tobacco Use  . Smoking status: Light Tobacco Smoker    Packs/day: 0.25    Years: 20.00    Pack years: 5.00    Types: Cigarettes  . Smokeless tobacco: Never Used  . Tobacco comment: 1 cig daily  Substance Use Topics  . Alcohol use: No    Alcohol/week: 0.0 standard drinks         Allergies  Allergen Reactions   . Erythromycin Anaphylaxis, Hives and Nausea Only  . Levofloxacin Other (See Comments)    Burning during administration IV prior to surgery.Pt was told not to take it again.  . Niaspan [Niacin] Anaphylaxis  . Penicillins Anaphylaxis    Has patient had a PCN reaction causing immediate rash, facial/tongue/throat swelling, SOB or lightheadedness with hypotension: Yes Has patient had a PCN reaction causing severe rash involving mucus membranes or skin necrosis: No Has patient had a PCN reaction that required hospitalization: No Has patient had a PCN reaction occurring within the last 10 years: No If all of the above answers are "NO", then may proceed with Cephalosporin use.   . Shellfish Allergy Anaphylaxis  . Sulfa Antibiotics Anaphylaxis    "Blisters in my throat"  . Wellbutrin [Bupropion] Other (See Comments)    Suicidal thoughts while on Lexapro  . Morphine Other (See Comments)    ineffective  . Strawberry Extract Hives  . Sulfamethoxazole Other (See Comments)    Blister in throat, sores in mouth  . Tape Other (See Comments)    Transpore Tape causes skin to peel;  and Nicoderm Adhesive Patch cause blisters   . Cefdinir     Unknown reaction   . Doxycycline Hives and Nausea Only    A 100mg  tablet formulation caused hives, nausea, vomiting.  Keflex [Cephalexin] Other (See Comments)    Hallucinations  . Statins Other (See Comments)    Muscle pain  . Vancomycin Hives and Nausea Only  . Zetia [Ezetimibe] Diarrhea  . 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 ALPRAZolam (XANAX) 0.5 MG tablet 1-1.5 mg at night as needed for anxiety/sleep 90 tablet 0  . aspirin EC 81 MG tablet Take 81 mg by mouth daily.    . clopidogrel (PLAVIX) 75 MG tablet Take 1 tablet (75 mg total) by mouth daily. 90 tablet 3   . EPIPEN 2-PAK 0.3 MG/0.3ML SOAJ injection Inject 0.3 mg into the skin daily as needed (allergic reaction).     . Evolocumab 140 MG/ML SOSY Inject 140 mg into the skin every 14 (fourteen) days.    . insulin lispro (HUMALOG) 100 UNIT/ML injection continuous. Insulin pump    . levothyroxine (SYNTHROID) 75 MCG tablet Take 75 mcg by mouth daily before breakfast.     . methylphenidate (CONCERTA) 36 MG PO CR tablet Take 1 tablet (36 mg total) by mouth daily before breakfast. 30 tablet 0  . Multiple Vitamin (MULTIVITAMIN WITH MINERALS) TABS tablet Take 2 tablets by mouth daily.    [name] OVER THE COUNTER MEDICATION Take 2 tablets by mouth daily. D-[id number]-A otc supplement    . pantoprazole (PROTONIX) 40 MG tablet Take 80 mg by mouth at bedtime.     [name] PRESCRIPTION MEDICATION Take 2-3 capsules by mouth See admin instructions. BIOKACE * ONLY IF HAVING A MEAL -  3 caps with meals and 2 caps with snacks    . Vitamin D, Ergocalciferol, (DRISDOL) 1.25 MG (50000 UNIT) CAPS capsule Take 50,000 Units by mouth 3 (three) times a week.     . methylphenidate (CONCERTA) 27 MG PO CR tablet Take 1 tablet (27 mg total) by mouth daily before breakfast. 30 tablet 0  . methylphenidate (CONCERTA) 27 MG PO CR tablet Take 1 tablet (27 mg total) by mouth daily before breakfast. 30 tablet 0  . methylphenidate (CONCERTA) 36 MG PO CR tablet Take 1 tablet (36 mg total) by mouth daily before breakfast. 30 tablet 0   No current facility-administered medications for this visit.    Review of Systems  Constitutional:  Constitutional negative. HENT: HENT negative.  Eyes: Eyes negative.  Respiratory: Respiratory negative.  Cardiovascular: Positive for claudication.  GI: Gastrointestinal negative.  Musculoskeletal: Positive for gait problem, leg pain and joint pain.  Skin: Positive for wound.  Hematologic: Hematologic/lymphatic negative.  Psychiatric: Psychiatric negative.        Objective:   Vitals:    08/07/20 0558  BP: 132/72  Pulse: 71  Resp: 17  Temp: 98.3 F (36.8 C)  SpO2: 100%     Physical Exam Constitutional:      Appearance: Normal appearance.  HENT:     Head: Normocephalic.     Nose:     Comments: Wearing a mask Eyes:     Pupils: Pupils are equal, round, and reactive to light.  Cardiovascular:     Pulses:          Femoral pulses are 2+ on the right side and 2+ on the left side.      Dorsalis pedis pulses are 2+ on the right side and 2+ on the left side.       Posterior tibial pulses are 2+ on the left side.  Pulmonary:     Effort: Pulmonary effort is normal.  Abdominal:     General: Abdomen is flat.     Palpations: Abdomen is soft. There is no mass.     Comments: G-tube in place  Musculoskeletal:        General: Normal range of motion.     Cervical back: Normal range of motion and neck supple.     Right lower leg: No edema.     Left lower leg: No edema.  Skin:    General: Skin is warm.     Capillary Refill: Capillary refill takes less than 2 seconds.     Comments: She has some superficial skin changes of her right lateral foot  Neurological:     General: No focal deficit present.     Mental Status: She is alert.  Psychiatric:        Mood and Affect: Mood normal.        Behavior: Behavior normal.        Thought Content: Thought content normal.        Judgment: Judgment normal.    +-------+-----------+-----------+------------+------------+  ABI/TBIToday's ABIToday's TBIPrevious ABIPrevious TBI  +-------+-----------+-----------+------------+------------+  Right 0.80    0.60    1.02    1.08      +-------+-----------+-----------+------------+------------+  Left  0.95    0.77    1.14    0.85      +-------+-----------+-----------+------------+------------+         Assessment/Plan:   45 year old female with history of right common iliac artery stenting and previous left fem-pop bypass with vein.   She does have a previous history of  pancreatectomy and a G-tube in place.  She quit smoking recently. Now has wound on her right foot with short distance claudication and a 25mmHg pressure gradient from her aorta to her right external iliac artery. Will plan for Aortobifemoral bypass today in OR.   Gianni Fuchs C. Randie Heinzain, MD [facility] Office: [phone] Pager: [phone]

## 2020-08-07 NOTE — Transfer of Care (Signed)
Immediate Anesthesia Transfer of Care Note  Patient: Jessica [name]  Procedure(s) Performed: AORTA BIFEMORAL BYPASS GRAFTING (Bilateral Abdomen)  Patient Location: PACU  Anesthesia Type:General  Level of Consciousness: drowsy  Airway & Oxygen Therapy: Patient Spontanous Breathing and Patient connected to face mask oxygen  Post-op Assessment: Report given to RN and Post -op Vital signs reviewed and stable  Post vital signs: Reviewed and stable  Last Vitals:  Vitals Value Taken Time  BP 138/78 08/07/20 1218  Temp    Pulse 87 08/07/20 1224  Resp 22 08/07/20 1224  SpO2 100 % 08/07/20 1224  Vitals shown include unvalidated device data.  Last Pain:  Vitals:   08/07/20 0619  TempSrc:   PainSc: 9       Patients Stated Pain Goal: 3 (08/07/20 0093)  Complications: No complications documented.

## 2020-08-07 NOTE — Anesthesia Procedure Notes (Signed)
Arterial Line Insertion Start/End2/17/2022 7:10 AM, 08/07/2020 7:20 AM Performed by: Tressia Miners, CRNA, CRNA  Patient location: Pre-op. Preanesthetic checklist: patient identified, IV checked, site marked, risks and benefits discussed, surgical consent, monitors and equipment checked, pre-op evaluation, timeout performed and anesthesia consent Lidocaine 1% used for infiltration Left, radial was placed Catheter size: 20 G Hand hygiene performed , maximum sterile barriers used  and Seldinger technique used Allen's test indicative of satisfactory collateral circulation Attempts: 1 Procedure performed without using ultrasound guided technique. Following insertion, dressing applied and Biopatch. Post procedure assessment: normal and unchanged

## 2020-08-07 NOTE — Anesthesia Procedure Notes (Signed)
Procedure Name: Intubation Date/Time: 08/07/2020 7:53 AM Performed by: Reece Agar, CRNA Pre-anesthesia Checklist: Patient identified, Emergency Drugs available, Suction available and Patient being monitored Patient Re-evaluated:Patient Re-evaluated prior to induction Oxygen Delivery Method: Circle System Utilized Preoxygenation: Pre-oxygenation with 100% oxygen Induction Type: IV induction Ventilation: Mask ventilation without difficulty Laryngoscope Size: Mac and 3 Grade View: Grade I Tube type: Oral Tube size: 7.0 mm Number of attempts: 1 Airway Equipment and Method: Stylet Placement Confirmation: ETT inserted through vocal cords under direct vision,  positive ETCO2 and breath sounds checked- equal and bilateral Secured at: 23 cm Tube secured with: Tape Dental Injury: Teeth and Oropharynx as per pre-operative assessment

## 2020-08-07 NOTE — Anesthesia Procedure Notes (Signed)
Central Venous Catheter Insertion Performed by: Cecile Hearing, MD, anesthesiologist Start/End2/17/2022 7:08 AM, 08/07/2020 7:18 AM Preanesthetic checklist: patient identified, IV checked, site marked, risks and benefits discussed, surgical consent, monitors and equipment checked, pre-op evaluation, timeout performed and anesthesia consent Position: Trendelenburg Lidocaine 1% used for infiltration and patient sedated Hand hygiene performed , maximum sterile barriers used  and Seldinger technique used Catheter size: 8 Fr Total catheter length 16. Central line was placed.Double lumen Procedure performed using ultrasound guided technique. Ultrasound Notes:anatomy identified, needle tip was noted to be adjacent to the nerve/plexus identified, no ultrasound evidence of intravascular and/or intraneural injection and image(s) printed for medical record Attempts: 1 Following insertion, line sutured, dressing applied and Biopatch. Post procedure assessment: blood return through all ports, free fluid flow and no air  Patient tolerated the procedure well with no immediate complications.

## 2020-08-07 NOTE — Op Note (Signed)
Patient name: Jessica [name] MRN: [id number] DOB: [birth year] Sex: female  08/07/2020 Pre-operative Diagnosis: Critical right lower extremity ischemia with wound Post-operative diagnosis:  Same Surgeon:  Apolinar Junes C. Randie Heinz, MD Assistant: Doreatha Massed, PA Procedure Performed: 1.  Exploratory laparotomy with lysis of adhesions for 30 minutes 2.  Redo exposure left common femoral artery greater than 30 days 3.  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  She has previous right common iliac artery stenting in the left common femoral to popliteal artery bypass graft with vein.  She has decreased ABIs without palpable common femoral pulses consistent with aortoiliac disease and she also has a wound on her right lateral foot.  She is indicated for aortobifemoral bypass grafting.  An assistant was necessary to expedite the case  Findings: The aorta was heavily calcified.  An end-to-end anastomosis was placed.  The inferior mesenteric artery was occluded.  The common femoral arteries were soft.  At completion there were palpable dorsalis pedis arteries bilaterally   Procedure:  The patient was identified in the holding area and taken to the operating room where she is placed supine on operative table and general anesthesia was induced.  She was sterilely prepped and draped in the usual fashion antibiotics were administered and a timeout was called.  During draping the did protect the G-tube.  We began with midline incision in the periumbilical area.  We dissected down through the skin subcutaneous tissue where there was significant scar tissue more cephalad.  We ultimately entered the abdomen we were able to extend the incision inferiorly to the pubis.  We then lysed adhesions including significant omental adhesions to the anterior  incision.  We were able to open the incision up to about the level of the stomach adhering to the anterior abdominal wall.  We then proceeded lysing adhesions in the abdomen to free the small bowel from the left pelvic sidewall as well as the left lateral sidewall.  Ultimately all of small bowel was freed and the left colon and sigmoid colon were free from the small bowel.  We then placed the self-retaining Omni retractor.  We opened the retroperitoneum down onto the aorta.  We wanted was used to expose the aorta better.  We did have to work up under the adherent stomach we did identify the renal vein we did never identify the renal arteries.  We were able to place an umbilical tape around the aorta proximally.  We worked on the aortic bifurcation where there was dense scar tissue likely from previous common iliac artery stenting on the right.  We began tunneling down both common iliac arteries.  We then turned our attention to the right groin.  Longitudinal incision was made.  We dissected down to the common femoral artery.  We dissected up onto the inguinal ligament.  I did not identify any crossing vein.  We exposed the common femoral artery where it could be clamped proximally and distally and this was very soft artery.  We then turned our attention the left groin.  We reopened the previous incision.  We dissected down through dense scar tissue.  We dissected up onto the inguinal ligament identified the common femoral artery high first.  We then dissected down around the bypass there was minimal pulsatility within the bypass.  We dissected under the inguinal  ligament on the quite diminutive external leg arteries.  From both sides we then began tunneling up over the external leg arteries connecting to the intra-abdominal area.  We used a uterine dressing clamp to place umbilical tapes through both areas.  Patient was then fully heparinized.  After heparin had circulated for 3 minutes we then clamped the distal  aorta followed by the proximal aorta up near the renal vein but below the renal arteries.  This was done with an anterior posterior clamp most cephalad.  We then trimmed off the IMA it had no backbleeding it appeared occluded we placed 2 clips on this.  We then transected the aorta above our distal clamp.  We placed clips on lumbar arteries.  We oversewed the distal aorta with 3-0 Prolene suture in a running mattress fashion.  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  We dissected this back to healthy tissue.  We then brought a 14 x 7 mm Dacron graft in place trimmed this to size and sewed it end-to-end with 3-0 Prolene suture.  Upon completion we released our clamp on her aorta.  We did have to place to repair sutures on the anterior wall of aorta.  We then placed a 16 mm cuff over our anastomosis.  This all appeared to be hemostatic.  We then tunneled our limbs to the bilateral groins.  We started on the right groin.  We clamped the common femoral artery distally followed by proximally opened longitudinally.  The dacryon graft was pulled straight trimmed to size and sewn into side with 5-0 Prolene suture.  Prior completion without flushing all directions.  Upon completion we released the clamp on the right limb of the graft.  We maintained clamp on the left limb.  We confirmed Doppler flow in the profunda and the distal SFA within the wound bed.  Satisfied with this we packed the groin we turned our attention to the left groin.  The common femoral similarly was clamped distally and proximally opened longitudinally.  The distal arteriotomy extended just up to the level of the femoropopliteal bypass graft.  We straighten the graft trimmed to size sewn into side with 5-0 Prolene suture.  Prior completion low flushing all directions again.  We then released our clamp.  We had very good flow confirmed with Doppler in our SFA and profunda.  The feet were then checked for  good flow in the bilateral dorsalis pedis arteries they appeared nonischemic.  50 mg of protamine was administered.  We obtained stasis.  We irrigated all of our wounds.  We turned our attention back to the abdomen where both anastomoses were inspected.  We closed the retroperitoneum over the graft with running 2-0 Vicryl suture.  The entirety of the small bowel was inspected was noted to be without any areas of abnormality and the J-tube was palpated.  The colon was inspected was noted to be normal.  We returned what omentum was left overlying her bowel.  We then closed our midline incision with running #1 PDS suture from both sides.  The skin was then closed with 4-0 Monocryl.  Both groins were inspected irrigated and hemostasis obtained.  We then closed them in layers of Vicryl and Monocryl.  Dermabond is placed at the site of all incisions.  She was then awakened from anesthesia having tolerated procedure without any complication.  All counts were correct at completion of   EBL: 500cc  Transfusion:  100cc cell saver  Elisea Khader C. Randie Heinz, MD [facility] [place] Office: [phone] Pager: [phone]

## 2020-08-08 ENCOUNTER — Telehealth: Payer: Medicare (Managed Care) | Admitting: Psychiatry

## 2020-08-08 ENCOUNTER — Inpatient Hospital Stay (HOSPITAL_COMMUNITY): Payer: Medicare (Managed Care)

## 2020-08-08 ENCOUNTER — Encounter (HOSPITAL_COMMUNITY): Payer: Self-pay | Admitting: Vascular Surgery

## 2020-08-08 LAB — GLUCOSE, CAPILLARY
Glucose-Capillary: 123 mg/dL — ABNORMAL HIGH (ref 70–99)
Glucose-Capillary: 112 mg/dL — ABNORMAL HIGH (ref 70–99)
Glucose-Capillary: 105 mg/dL — ABNORMAL HIGH (ref 70–99)
Glucose-Capillary: 105 mg/dL — ABNORMAL HIGH (ref 70–99)
Glucose-Capillary: 109 mg/dL — ABNORMAL HIGH (ref 70–99)
Glucose-Capillary: 110 mg/dL — ABNORMAL HIGH (ref 70–99)
Glucose-Capillary: 115 mg/dL — ABNORMAL HIGH (ref 70–99)
Glucose-Capillary: 117 mg/dL — ABNORMAL HIGH (ref 70–99)
Glucose-Capillary: 121 mg/dL — ABNORMAL HIGH (ref 70–99)
Glucose-Capillary: 127 mg/dL — ABNORMAL HIGH (ref 70–99)
Glucose-Capillary: 92 mg/dL (ref 70–99)
Glucose-Capillary: 92 mg/dL (ref 70–99)
Glucose-Capillary: 95 mg/dL (ref 70–99)
Glucose-Capillary: 97 mg/dL (ref 70–99)
Glucose-Capillary: 98 mg/dL (ref 70–99)
Glucose-Capillary: 113 mg/dL — ABNORMAL HIGH (ref 70–99)
Glucose-Capillary: 104 mg/dL — ABNORMAL HIGH (ref 70–99)
Glucose-Capillary: 114 mg/dL — ABNORMAL HIGH (ref 70–99)
Glucose-Capillary: 119 mg/dL — ABNORMAL HIGH (ref 70–99)
Glucose-Capillary: 107 mg/dL — ABNORMAL HIGH (ref 70–99)
Glucose-Capillary: 115 mg/dL — ABNORMAL HIGH (ref 70–99)
Glucose-Capillary: 114 mg/dL — ABNORMAL HIGH (ref 70–99)
Glucose-Capillary: 113 mg/dL — ABNORMAL HIGH (ref 70–99)
Glucose-Capillary: 119 mg/dL — ABNORMAL HIGH (ref 70–99)
Glucose-Capillary: 123 mg/dL — ABNORMAL HIGH (ref 70–99)

## 2020-08-08 LAB — AMYLASE: Amylase: 8 U/L — ABNORMAL LOW (ref 28–100)

## 2020-08-08 LAB — CBC
HCT: 34.9 % — ABNORMAL LOW (ref 36.0–46.0)
Hemoglobin: 11.5 g/dL — ABNORMAL LOW (ref 12.0–15.0)
MCH: 31.9 pg (ref 26.0–34.0)
MCHC: 33 g/dL (ref 30.0–36.0)
MCV: 96.9 fL (ref 80.0–100.0)
Platelets: 290 10*3/uL (ref 150–400)
RBC: 3.6 MIL/uL — ABNORMAL LOW (ref 3.87–5.11)
RDW: 13 % (ref 11.5–15.5)
WBC: 28.8 10*3/uL — ABNORMAL HIGH (ref 4.0–10.5)
nRBC: 0 % (ref 0.0–0.2)

## 2020-08-08 LAB — COMPREHENSIVE METABOLIC PANEL
ALT: 17 U/L (ref 0–44)
AST: 22 U/L (ref 15–41)
Albumin: 3.2 g/dL — ABNORMAL LOW (ref 3.5–5.0)
Alkaline Phosphatase: 62 U/L (ref 38–126)
Anion gap: 8 (ref 5–15)
BUN: 7 mg/dL (ref 6–20)
CO2: 24 mmol/L (ref 22–32)
Calcium: 8.4 mg/dL — ABNORMAL LOW (ref 8.9–10.3)
Chloride: 106 mmol/L (ref 98–111)
Creatinine, Ser: 0.71 mg/dL (ref 0.44–1.00)
GFR, Estimated: >60 mL/min (ref >60)
Glucose, Bld: 127 mg/dL — ABNORMAL HIGH (ref 70–99)
Potassium: 4.3 mmol/L (ref 3.5–5.1)
Sodium: 138 mmol/L (ref 135–145)
Total Bilirubin: 0.8 mg/dL (ref 0.3–1.2)
Total Protein: 5.8 g/dL — ABNORMAL LOW (ref 6.5–8.1)

## 2020-08-08 LAB — MAGNESIUM: Magnesium: 1.9 mg/dL (ref 1.7–2.4)

## 2020-08-08 NOTE — Progress Notes (Signed)
  Progress Note    08/08/2020 11:58 AM 1 Day Post-Op  Patient stable for transfer to 4e Discussed with DM coordinator and Pt and suggest continuing IV insulin until tube feeds start, hopefully tomorrow then can transition back to insulin pump Ice chips and clears today D/C foley and A-line Transfer order placed  Graceann Congress, PA-C [facility] [phone] 08/08/2020 11:58 AM

## 2020-08-08 NOTE — Progress Notes (Signed)
Inpatient Diabetes Program Recommendations  AACE/ADA: New Consensus Statement on Inpatient Glycemic Control (2015)  Target Ranges:  Prepandial:   less than 140 mg/dL      Peak postprandial:   less than 180 mg/dL (1-2 hours)      Critically ill patients:  140 - 180 mg/dL   Lab Results  Component Value Date   GLUCAP 114 (H) 08/08/2020   HGBA1C 8.5 (H) 08/05/2020    Review of Glycemic Control Results for [name], [name] (MRN [id number]) as of 08/08/2020 11:01  Ref. Range 08/08/2020 06:36 08/08/2020 07:35 08/08/2020 09:50 08/08/2020 10:51  Glucose-Capillary Latest Ref Range: 70 - 99 mg/dL 105 (H) 113 (H) 104 (H) 114 (H)   Diabetes history: DM 1 Outpatient Diabetes medications: insulin pump Medtronic 670G 0000-0.3 units/hr  0900- 1.2 units/hr  2200-0.5 units/hr  CR 1:15  SF 1 :45 TG 120 Current orders for Inpatient glycemic control: IV insulin  Inpatient Diabetes Program Recommendations:    Met with patient and PA.  Patient will not eat until tomorrow 08/09/20.  She would prefer to wait until tomorrow to restart insulin pump.  Patient will continue insulin drip until insulin pump restarted (will need 1 hour overlap).    Thanks  Adah Perl, RN, BC-ADM Inpatient Diabetes Coordinator Pager [phone] (8a-5p)

## 2020-08-08 NOTE — Evaluation (Signed)
Occupational Therapy Evaluation Patient Details Name: Jessica [name] MRN: [id number] DOB: 03-16-1976 Today's Date: 08/08/2020    History of Present Illness 45 y.o. female previous history of right common iliac artery stenting on 2 occasions previous left femoropopliteal bypass. 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  Recently she had stopped her Plavix to have her tube exchanged. Presented with skin changes in her R lateral foot with pain and pain with walking. 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   Clinical Impression   Patient admitted for the above diagnosis and procedure.  PTA she was active and independent with ADL and functional mobility.  Deficits are listed below.  Currently she is needing up to Min A for functional transfers, Min a for upper body ADL, and up to Mod A for lower body ADL. Her plan is to return home, and as pain lessens, it is expected she will progress quickly.  OT to follow in the acute setting to maximize functional status for return to home.  HH PT has been recommended, HH OT will depend on progress,  OT will monitor for recommendations.      Follow Up Recommendations  No OT follow up    Equipment Recommendations  Tub/shower seat    Recommendations for Other Services       Precautions / Restrictions Precautions Precautions: Fall Precaution Comments: J-tube Restrictions Weight Bearing Restrictions: No      Mobility Bed Mobility Overal bed mobility: Needs Assistance Bed Mobility: Sit to Supine       Sit to supine: Min assist   General bed mobility comments: discomfort lifting legs back to bed.    Transfers Overall transfer level: Needs assistance Equipment used: Rolling walker (2 wheeled) Transfers: Sit to/from [employer] Sit to Stand: Min guard Stand pivot transfers: Min guard;Min assist       General transfer comment: min guard for safety, able to utilize UE to offweight  and scoot hips to edge of recliner, very slow, deliberate moving to come to standing however no physical assist required, unable to bring trunk to fully upright due to abdominal pain    Balance Overall balance assessment: Needs assistance Sitting-balance support: Feet supported;No upper extremity supported Sitting balance-Leahy Scale: Fair     Standing balance support: Bilateral upper extremity supported Standing balance-Leahy Scale: Poor Standing balance comment: requires UE support                           ADL either performed or assessed with clinical judgement   ADL Overall ADL's : Needs assistance/impaired Eating/Feeding: Set up Eating/Feeding Details (indicate cue type and reason): clear liquid and ice chips Grooming: Wash/dry hands;Wash/dry face;Set up;Sitting           Upper Body Dressing : Moderate assistance;Sitting   Lower Body Dressing: Moderate assistance;Sit to/from stand               Functional mobility during ADLs: Minimal assistance;Rolling walker;+2 for safety/equipment General ADL Comments: multiple lines and leads     Vision Baseline Vision/History: Wears glasses Patient Visual Report: No change from baseline       Perception     Praxis      Pertinent Vitals/Pain Pain Assessment: Faces  Faces Pain Scale: Hurts whole lot Pain Location: groin, R foot, abdomen, bilateral knee, hip joints with movement Pain Descriptors / Indicators: Grimacing;Guarding;Operative site guarding Pain Intervention(s): Monitored during session     Hand Dominance Right  Extremity/Trunk Assessment Upper Extremity Assessment Upper Extremity Assessment: Overall WFL for tasks assessed   Lower Extremity Assessment Lower Extremity Assessment: Defer to PT evaluation RLE: Unable to fully assess due to pain LLE: Unable to fully assess due to pain   Cervical / Trunk Assessment Cervical / Trunk Assessment: Normal   Communication  Communication Communication: No difficulties   Cognition Arousal/Alertness: Awake/alert Behavior During Therapy: WFL for tasks assessed/performed Overall Cognitive Status: Within Functional Limits for tasks assessed                                     General Comments  HR to 140s with ambulation, SaO2 on 1 L O2 > 90%O2 throughout session               Home Living Family/patient expects to be discharged to:: Private residence Living Arrangements: Spouse/significant other Available Help at Discharge: Available 24 hours/day;Family Type of Home: House Home Access: Stairs to enter [employer] of Steps: 2 (2 steps a flat and then 1 step)   Home Layout: One level     Bathroom Shower/Tub: Producer, television/film/video: Handicapped height Bathroom Accessibility: Yes   Home Equipment: None          Prior Functioning/Environment Level of Independence: Independent                 OT Problem List: Decreased activity tolerance;Impaired balance (sitting and/or standing);Pain;Decreased knowledge of use of DME or AE      OT Treatment/Interventions: Self-care/ADL training;Energy conservation;DME and/or AE instruction;Balance training;Therapeutic activities    OT Goals(Current goals can be found in the care plan section) Acute Rehab OT Goals Patient Stated Goal: get back home and be more active OT Goal Formulation: With patient Time For Goal Achievement: 08/22/20 ADL Goals Pt Will Perform Grooming: with modified independence;standing;sitting Pt Will Perform Lower Body Bathing: with modified independence;sit to/from stand Pt Will Perform Lower Body Dressing: with modified independence;sit to/from stand Pt Will Transfer to Toilet: with modified independence;ambulating;regular height toilet Pt Will Perform Toileting - Clothing Manipulation and hygiene: with modified independence;sit to/from stand  OT Frequency: Min 2X/week   Barriers to D/C:     none noted       Co-evaluation              AM-PAC OT "6 Clicks" Daily Activity     Outcome Measure Help from another person eating meals?: None Help from another person taking care of personal grooming?: A Little Help from another person toileting, which includes using toliet, bedpan, or urinal?: A Little Help from another person bathing (including washing, rinsing, drying)?: A Lot Help from another person to put on and taking off regular upper body clothing?: A Little Help from another person to put on and taking off regular lower body clothing?: A Lot 6 Click Score: 17   End of Session Equipment Utilized During Treatment: Rolling walker;Oxygen  Activity Tolerance: Patient limited by pain Patient left: in bed;with call bell/phone within reach  OT Visit Diagnosis: Unsteadiness on feet (R26.81);Pain                Time: 1110-1130 OT Time Calculation (min): 20 min Charges:  OT General Charges $OT Visit: 1 Visit OT Evaluation $OT Eval Moderate Complexity: 1 Mod  08/08/2020  Rich, OTR/L  Acute Rehabilitation Services  Office:  [phone]   Suzanna Obey 08/08/2020, 12:26 PM

## 2020-08-08 NOTE — Progress Notes (Addendum)
  Progress Note    08/08/2020 7:53 AM 1 Day Post-Op  Subjective:  Pain at abd incision   Vitals:   08/08/20 0600 08/08/20 0700  BP: 136/65 131/81  Pulse: 81 84  Resp: 18 18  Temp:    SpO2: 96% 94%   Physical Exam: Lungs:  Non labored Incisions:  abd and groin incisions c/d/i Extremities:  Palpable and symmetrical DP pulses Abdomen:  Soft Neurologic: A&O  CBC    Component Value Date/Time   WBC 28.8 (H) 08/08/2020 0312   RBC 3.60 (L) 08/08/2020 0312   HGB 11.5 (L) 08/08/2020 0312   HCT 34.9 (L) 08/08/2020 0312   PLT 290 08/08/2020 0312   MCV 96.9 08/08/2020 0312   MCH 31.9 08/08/2020 0312   MCHC 33.0 08/08/2020 0312   RDW 13.0 08/08/2020 0312    BMET    Component Value Date/Time   NA 138 08/08/2020 0312   K 4.3 08/08/2020 0312   CL 106 08/08/2020 0312   CO2 24 08/08/2020 0312   GLUCOSE 127 (H) 08/08/2020 0312   BUN 7 08/08/2020 0312   CREATININE 0.71 08/08/2020 0312   CALCIUM 8.4 (L) 08/08/2020 0312   GFRNONAA >60 08/08/2020 0312   GFRAA >90 05/10/2014 1100    INR    Component Value Date/Time   INR 1.3 (H) 08/07/2020 1247     Intake/Output Summary (Last 24 hours) at 08/08/2020 0753 Last data filed at 08/08/2020 0700 Gross per 24 hour  Intake 3933.55 ml  Output 2955 ml  Net 978.55 ml     Assessment/Plan:  45 y.o. female is s/p ABF bypass 1 Day Post-Op   BLE well perfused with symmetrical DP pulses Keep NPO today; Possibly start evening tube feeds tomorrow OOB today Good UOP; d/c foley D/c arterial line Possibly to 4E later this afternoon   Emilie Rutter, PA-C [facility] [phone] 08/08/2020 7:53 AM    I have interviewed and examined patient with PA and agree with assessment and plan above.  Feet are well-perfused.  Having incisional pain today.  We will keep G-tube to gravity she can have ice chips and sips of clears for comfort.  We will plan to start J-tube feeds likely this weekend.  Foley and A-line out.   Possible transfer to 4 E. later today.  Charls Custer C. Randie Heinz, MD [facility] [place] Office: [phone] Pager: [phone]

## 2020-08-08 NOTE — Evaluation (Signed)
Physical Therapy Evaluation Patient Details Name: Jessica [name] MRN: [id number] DOB: 1976/05/31 Today's Date: 08/08/2020   History of Present Illness  45 y.o. female previous history of right common iliac artery stenting on 2 occasions previous left femoropopliteal bypass. 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  Recently she had stopped her Plavix to have her tube exchanged. Presented with skin changes in her R lateral foot with pain and pain with walking. 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  Clinical Impression  PTA pt living with husband in single story home with 3 steps to enter. Pt independent with ambulation limited community distances without AD and is independent with ADLs and iADLs. Pt is currently limited in safe mobility by increased pain in abdomen, groin, R foot and generalized. Pt is minguard for transfers and light min A for ambulation with RW. PT recommending HHPT as pt will likely progress quickly once pain is under control. PT will continue to follow acutely.     Follow Up Recommendations Home health PT;Supervision/Assistance - 24 hour    Equipment Recommendations  Rolling walker with 5" wheels;3in1 (PT)    Recommendations for Other Services       Precautions / Restrictions Precautions Precautions: Other (comment) (g tube) Restrictions Weight Bearing Restrictions: No      Mobility  Bed Mobility               General bed mobility comments: OOB in recliner    Transfers Overall transfer level: Needs assistance Equipment used: Rolling walker (2 wheeled) Transfers: Sit to/from Stand Sit to Stand: Min guard         General transfer comment: min guard for safety, able to utilize UE to offweight and scoot hips to edge of recliner, very slow, deliberate moving to come to standing however no physical assist required, unable to bring trunk to fully upright due to abdominal  pain  Ambulation/Gait Ambulation/Gait assistance: Min assist Gait Distance (Feet): 5 Feet Assistive device: Rolling walker (2 wheeled) Gait Pattern/deviations: Step-through pattern;Decreased step length - right;Decreased step length - left;Shuffle;Antalgic;Trunk flexed Gait velocity: slowed Gait velocity interpretation: <1.31 ft/sec, indicative of household ambulator General Gait Details: light min A for safety, slow, shuffling steps forward, unable to attain fully upright due to pain, distance limited by pain      Balance Overall balance assessment: Needs assistance Sitting-balance support: Feet supported;No upper extremity supported Sitting balance-Leahy Scale: Fair     Standing balance support: Bilateral upper extremity supported Standing balance-Leahy Scale: Poor Standing balance comment: requires UE support                             Pertinent Vitals/Pain Pain Assessment: 0-10 Pain Score: 9  Pain Location: groin, R foot, abdomen, bilateral knee, hip joints Pain Descriptors / Indicators: Grimacing;Guarding;Moaning;Tightness;Sore;Throbbing;Constant;Operative site guarding Pain Intervention(s): Limited activity within patient's tolerance;Monitored during session;Repositioned;PCA encouraged;Premedicated before session    Home Living Family/patient expects to be discharged to:: Private residence Living Arrangements: Spouse/significant other Available Help at Discharge: Available 24 hours/day;Family Type of Home: House Home Access: Stairs to enter   [employer] of Steps: 2 (2 steps a flat and then 1 step) Home Layout: One level Home Equipment: None      Prior Function Level of Independence: Independent                  Extremity/Trunk Assessment   Upper Extremity Assessment Upper Extremity Assessment: Defer to OT evaluation  Lower Extremity Assessment Lower Extremity Assessment: Generalized weakness;RLE deficits/detail;LLE  deficits/detail RLE: Unable to fully assess due to pain LLE: Unable to fully assess due to pain       Communication   Communication: No difficulties  Cognition Arousal/Alertness: Awake/alert Behavior During Therapy: Flat affect Overall Cognitive Status: Within Functional Limits for tasks assessed                                        General Comments General comments (skin integrity, edema, etc.): HR to 140s with ambulation, SaO2 on 1 L O2 > 90%O2 throughout session        Assessment/Plan    PT Assessment Patient needs continued PT services  PT Problem List Decreased range of motion;Decreased activity tolerance;Decreased balance;Decreased mobility;Pain;Decreased skin integrity       PT Treatment Interventions DME instruction;Gait training;Stair training;Functional mobility training;Therapeutic activities;Therapeutic exercise;Balance training;Cognitive remediation;Patient/family education    PT Goals (Current goals can be found in the Care Plan section)  Acute Rehab PT Goals Patient Stated Goal: get back to riding her motorcycle PT Goal Formulation: With patient Time For Goal Achievement: 08/21/20 Potential to Achieve Goals: Good    Frequency Min 3X/week    AM-PAC PT "6 Clicks" Mobility  Outcome Measure Help needed turning from your back to your side while in a flat bed without using bedrails?: A Lot Help needed moving from lying on your back to sitting on the side of a flat bed without using bedrails?: A Lot Help needed moving to and from a bed to a chair (including a wheelchair)?: A Little Help needed standing up from a chair using your arms (e.g., wheelchair or bedside chair)?: A Little Help needed to walk in hospital room?: A Little Help needed climbing 3-5 steps with a railing? : Total 6 Click Score: 14    End of Session Equipment Utilized During Treatment: Oxygen Activity Tolerance: Patient limited by pain Patient left: in chair;with call  bell/phone within reach Nurse Communication: Mobility status;Other (comment) (increased overall pain) PT Visit Diagnosis: Other abnormalities of gait and mobility (R26.89);Muscle weakness (generalized) (M62.81);Difficulty in walking, not elsewhere classified (R26.2);Pain Pain - part of body: Knee;Ankle and joints of foot;Hip (abdomen, groin)    Time: 9233-0076 PT Time Calculation (min) (ACUTE ONLY): 40 min   Charges:   PT Evaluation $PT Eval Moderate Complexity: 1 Mod PT Treatments $Gait Training: 8-22 mins $Therapeutic Activity: 8-22 mins       Sherrina Zaugg B. Beverely Risen PT, DPT Acute Rehabilitation Services Pager [phone] Office [phone]   [facility]on [facility] 08/08/2020, 10:51 AM

## 2020-08-09 LAB — BASIC METABOLIC PANEL
Anion gap: 10 (ref 5–15)
BUN: 9 mg/dL (ref 6–20)
CO2: 24 mmol/L (ref 22–32)
Calcium: 8.2 mg/dL — ABNORMAL LOW (ref 8.9–10.3)
Chloride: 103 mmol/L (ref 98–111)
Creatinine, Ser: 0.64 mg/dL (ref 0.44–1.00)
GFR, Estimated: >60 mL/min (ref >60)
Glucose, Bld: 118 mg/dL — ABNORMAL HIGH (ref 70–99)
Potassium: 4 mmol/L (ref 3.5–5.1)
Sodium: 137 mmol/L (ref 135–145)

## 2020-08-09 LAB — CBC
HCT: 33 % — ABNORMAL LOW (ref 36.0–46.0)
Hemoglobin: 10.7 g/dL — ABNORMAL LOW (ref 12.0–15.0)
MCH: 31.7 pg (ref 26.0–34.0)
MCHC: 32.4 g/dL (ref 30.0–36.0)
MCV: 97.6 fL (ref 80.0–100.0)
Platelets: 244 10*3/uL (ref 150–400)
RBC: 3.38 MIL/uL — ABNORMAL LOW (ref 3.87–5.11)
RDW: 13 % (ref 11.5–15.5)
WBC: 30 10*3/uL — ABNORMAL HIGH (ref 4.0–10.5)
nRBC: 0 % (ref 0.0–0.2)

## 2020-08-09 LAB — GLUCOSE, CAPILLARY
Glucose-Capillary: 101 mg/dL — ABNORMAL HIGH (ref 70–99)
Glucose-Capillary: 102 mg/dL — ABNORMAL HIGH (ref 70–99)
Glucose-Capillary: 102 mg/dL — ABNORMAL HIGH (ref 70–99)
Glucose-Capillary: 102 mg/dL — ABNORMAL HIGH (ref 70–99)
Glucose-Capillary: 104 mg/dL — ABNORMAL HIGH (ref 70–99)
Glucose-Capillary: 105 mg/dL — ABNORMAL HIGH (ref 70–99)
Glucose-Capillary: 106 mg/dL — ABNORMAL HIGH (ref 70–99)
Glucose-Capillary: 107 mg/dL — ABNORMAL HIGH (ref 70–99)
Glucose-Capillary: 108 mg/dL — ABNORMAL HIGH (ref 70–99)
Glucose-Capillary: 108 mg/dL — ABNORMAL HIGH (ref 70–99)
Glucose-Capillary: 108 mg/dL — ABNORMAL HIGH (ref 70–99)
Glucose-Capillary: 109 mg/dL — ABNORMAL HIGH (ref 70–99)
Glucose-Capillary: 112 mg/dL — ABNORMAL HIGH (ref 70–99)
Glucose-Capillary: 113 mg/dL — ABNORMAL HIGH (ref 70–99)
Glucose-Capillary: 114 mg/dL — ABNORMAL HIGH (ref 70–99)
Glucose-Capillary: 114 mg/dL — ABNORMAL HIGH (ref 70–99)
Glucose-Capillary: 117 mg/dL — ABNORMAL HIGH (ref 70–99)
Glucose-Capillary: 121 mg/dL — ABNORMAL HIGH (ref 70–99)
Glucose-Capillary: 92 mg/dL (ref 70–99)
Glucose-Capillary: 99 mg/dL (ref 70–99)

## 2020-08-09 MED ORDER — METHOCARBAMOL 500 MG PO TABS
500.0000 mg | ORAL_TABLET | Freq: Three times a day (TID) | ORAL | Status: DC
  Administered 2020-08-09 – 2020-08-12 (×12): 500 mg via ORAL
  Filled 2020-08-09 (×14): qty 1

## 2020-08-09 MED ORDER — ACETAMINOPHEN 325 MG PO TABS
650.0000 mg | ORAL_TABLET | Freq: Four times a day (QID) | ORAL | Status: DC
  Administered 2020-08-09 – 2020-08-12 (×14): 650 mg via ORAL
  Filled 2020-08-09 (×16): qty 2

## 2020-08-09 MED ORDER — GABAPENTIN 100 MG PO CAPS
100.0000 mg | ORAL_CAPSULE | Freq: Three times a day (TID) | ORAL | Status: DC
  Administered 2020-08-09 – 2020-08-16 (×17): 100 mg via ORAL
  Filled 2020-08-09 (×19): qty 1

## 2020-08-09 NOTE — Progress Notes (Addendum)
   ASSESSMENT & PLAN:  Jessica [name] is a 45 y.o. 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   CNS: Wean from PCA. Multimodal pain regimen. PT / OT / OOB / Ambulate. CV: No issues after ABF. Continue telemetry. Remove CVL after adequate peripheral access. Pulm: Encourage pulmonary hygiene: IS / OOB. GI: OK for sips of clears. GJ in place. G to gravity. Clamp J. Await return of bowel function. FEN/GU: Continue IVF. Monitor UOP. No need for foley. Replete electrolytes per protocol. Will need TPN if no enteral nutrition by POD #8.  Heme: Hgb / PLTs stable. No need for transfusion.  ID: WBC remains elevated (28--> 28 --> 30k). Tmax 99. No need for ABx. Monitor leukocytosis. Endo: Continue insulin infusion. BG goal 120-180. Prophy: SQH. SCDs. Protonix while NPO.   Dispo: Looks ready for stepdown unit, but requiring insulin drip. Ok for 4E if they can manage insulin gtt.  SUBJECTIVE:  No complaints. Mobilizing appropriately. Up to chair. Pain well controlled. No N/V. No flatus or BMs.   OBJECTIVE:  BP 123/76   Pulse 89   Temp 99 F (37.2 C)   Resp 15   Ht 5\' 6"  (1.676 m)   Wt 65.8 kg   SpO2 97%   BMI 23.40 kg/m   Intake/Output Summary (Last 24 hours) at 08/09/2020 1005 Last data filed at 08/09/2020 0700 Gross per 24 hour  Intake 2106.11 ml  Output 600 ml  Net 1506.11 ml    NAD RRR CVL in place Unlabored Abdomen soft, non-tender, non-distended Laparotomy and groin incisions clean, dry, intact 2+ DP BLE  CBC Latest Ref Rng & Units 08/09/2020 08/08/2020 08/07/2020  WBC 4.0 - 10.5 K/uL 30.0(H) 28.8(H) 28.7(H)  Hemoglobin 12.0 - 15.0 g/dL 10.7(L) 11.5(L) 11.3(L)  Hematocrit 36.0 - 46.0 % 33.0(L) 34.9(L) 33.8(L)  Platelets 150 - 400 K/uL 244 290 294     CMP Latest Ref Rng & Units 08/09/2020 08/08/2020 08/07/2020  Glucose 70 - 99 mg/dL 08/09/2020) 448(J) 856(D)  BUN 6 - 20 mg/dL 9 7 7   Creatinine 0.44 - 1.00 mg/dL 149(F 0.26  Sodium 135 - 145 mmol/L 137 138 139   Potassium 3.5 - 5.1 mmol/L 4.0 4.3 3.9  Chloride 98 - 111 mmol/L 103 106 109  CO2 22 - 32 mmol/L 24 24 22   Calcium 8.9 - 10.3 mg/dL 8.2(L) 8.4(L) 8.3(L)  Total Protein 6.5 - 8.1 g/dL - 5.8(L) -  Total Bilirubin 0.3 - 1.2 mg/dL - 0.8 -  Alkaline Phos 38 - 126 U/L - 62 -  AST 15 - 41 U/L - 22 -  ALT 0 - 44 U/L - 17 -    Estimated Creatinine Clearance: 82.3 mL/min (by C-G formula based on SCr of 0.64 mg/dL).  3.78. 5.88, MD [facility] Phone Number: [phone] 08/09/2020 10:05 AM

## 2020-08-09 NOTE — Progress Notes (Signed)
Patient to 4E15 from 2H. Vital signs obtained. On monitor CCMD notified. Alert and oriented to room and call light. Call bell within reach.  Sabra Heck, RN

## 2020-08-09 NOTE — Progress Notes (Signed)
Mobility Specialist - Progress Note   08/09/20 1426  Mobility  Activity Ambulated in hall  Level of Assistance Standby assist, set-up cues, supervision of patient - no hands on  Assistive Device Front wheel walker  Distance Ambulated (ft) 80 ft  Mobility Response Tolerated well  Mobility performed by Mobility specialist  $Mobility charge 1 Mobility   Pre-mobility: 97 HR, 92% SpO2 During mobility: 142 HR Post-mobility: 105 HR, 91% SpO2  Distance limited by urinary urgency. RR up to 36 during ambulation, it was in low 20s during rest. Pt to bathroom, then to recliner after walk.   Mamie Levers Mobility Specialist Mobility Specialist Phone: [phone]

## 2020-08-09 NOTE — Progress Notes (Signed)
4cc of hydromorphone wasted with Biochemist, clinical.  Sabra Heck, RN

## 2020-08-09 NOTE — Progress Notes (Signed)
Inpatient Diabetes Program Recommendations  AACE/ADA: New Consensus Statement on Inpatient Glycemic Control (2015)  Target Ranges:  Prepandial:   less than 140 mg/dL      Peak postprandial:   less than 180 mg/dL (1-2 hours)      Critically ill patients:  140 - 180 mg/dL   Lab Results  Component Value Date   GLUCAP 121 (H) 08/09/2020   HGBA1C 8.5 (H) 08/05/2020    Review of Glycemic Control Results for [name], [name] (MRN [id number]) as of 08/09/2020 12:03  Ref. Range 08/09/2020 04:43 08/09/2020 06:14 08/09/2020 07:29 08/09/2020 10:13 08/09/2020 11:22  Glucose-Capillary Latest Ref Range: 70 - 99 mg/dL 808 (H) 811 (H) 031 (H) 108 (H) 121 (H)   Diabetes history: DM Outpatient Diabetes medications: Insulin pump Current orders for Inpatient glycemic control: IV insulin  Spoke with Shanda Bumps, Charity fundraiser.  Asked if plan was to transition to insulin pump today.  Looks like maybe tomorrow. When ready to transition to pump, please have patient apply pump 1 hour prior to stopping IV insulin.  MD can order insulin pump order set and RN can print off insulin pump log and contract.  Patient must be alert, oriented and able to independently apply and operate insulin pump.  Will continue to follow while inpatient.  Thank you, Dulce Sellar, RN, BSN Diabetes Coordinator Inpatient Diabetes Program [phone] (team pager from 8a-5p)

## 2020-08-10 LAB — GLUCOSE, CAPILLARY
Glucose-Capillary: 100 mg/dL — ABNORMAL HIGH (ref 70–99)
Glucose-Capillary: 100 mg/dL — ABNORMAL HIGH (ref 70–99)
Glucose-Capillary: 102 mg/dL — ABNORMAL HIGH (ref 70–99)
Glucose-Capillary: 104 mg/dL — ABNORMAL HIGH (ref 70–99)
Glucose-Capillary: 104 mg/dL — ABNORMAL HIGH (ref 70–99)
Glucose-Capillary: 104 mg/dL — ABNORMAL HIGH (ref 70–99)
Glucose-Capillary: 106 mg/dL — ABNORMAL HIGH (ref 70–99)
Glucose-Capillary: 106 mg/dL — ABNORMAL HIGH (ref 70–99)
Glucose-Capillary: 108 mg/dL — ABNORMAL HIGH (ref 70–99)
Glucose-Capillary: 109 mg/dL — ABNORMAL HIGH (ref 70–99)
Glucose-Capillary: 110 mg/dL — ABNORMAL HIGH (ref 70–99)
Glucose-Capillary: 111 mg/dL — ABNORMAL HIGH (ref 70–99)
Glucose-Capillary: 113 mg/dL — ABNORMAL HIGH (ref 70–99)
Glucose-Capillary: 113 mg/dL — ABNORMAL HIGH (ref 70–99)
Glucose-Capillary: 119 mg/dL — ABNORMAL HIGH (ref 70–99)
Glucose-Capillary: 119 mg/dL — ABNORMAL HIGH (ref 70–99)
Glucose-Capillary: 120 mg/dL — ABNORMAL HIGH (ref 70–99)
Glucose-Capillary: 123 mg/dL — ABNORMAL HIGH (ref 70–99)
Glucose-Capillary: 98 mg/dL (ref 70–99)
Glucose-Capillary: 98 mg/dL (ref 70–99)
Glucose-Capillary: 98 mg/dL (ref 70–99)
Glucose-Capillary: 99 mg/dL (ref 70–99)

## 2020-08-10 LAB — CBC
HCT: 30.7 % — ABNORMAL LOW (ref 36.0–46.0)
Hemoglobin: 10 g/dL — ABNORMAL LOW (ref 12.0–15.0)
MCH: 31.3 pg (ref 26.0–34.0)
MCHC: 32.6 g/dL (ref 30.0–36.0)
MCV: 95.9 fL (ref 80.0–100.0)
Platelets: 246 10*3/uL (ref 150–400)
RBC: 3.2 MIL/uL — ABNORMAL LOW (ref 3.87–5.11)
RDW: 12.6 % (ref 11.5–15.5)
WBC: 18.9 10*3/uL — ABNORMAL HIGH (ref 4.0–10.5)
nRBC: 0 % (ref 0.0–0.2)

## 2020-08-10 LAB — BASIC METABOLIC PANEL
Anion gap: 10 (ref 5–15)
BUN: 5 mg/dL — ABNORMAL LOW (ref 6–20)
CO2: 23 mmol/L (ref 22–32)
Calcium: 8.2 mg/dL — ABNORMAL LOW (ref 8.9–10.3)
Chloride: 102 mmol/L (ref 98–111)
Creatinine, Ser: 0.63 mg/dL (ref 0.44–1.00)
GFR, Estimated: >60 mL/min (ref >60)
Glucose, Bld: 119 mg/dL — ABNORMAL HIGH (ref 70–99)
Potassium: 3.4 mmol/L — ABNORMAL LOW (ref 3.5–5.1)
Sodium: 135 mmol/L (ref 135–145)

## 2020-08-10 NOTE — Progress Notes (Signed)
   ASSESSMENT & PLAN:  Jessica [name] is a 45 y.o. 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   Wean from PCA as able - still using it quite a bit.  Multimodal pain regimen.  PT / OT / OOB / Ambulate. Remove CVL after adequate peripheral access - unclear why this was not done yesterday. OK for sips of clears. GJ in place. G to gravity. Clamp J. Await return of bowel function. WBC remains elevated (28--> 28 --> 30k), pending CBC today. Monitor. Continue insulin infusion. BG goal 120-180.  SUBJECTIVE:  No complaints. Mobilizing appropriately. Up to chair. Pain well controlled. No N/V. No flatus or BMs.   OBJECTIVE:  BP 135/76 (BP Location: Left Arm)   Pulse 84   Temp 98.5 F (36.9 C) (Oral)   Resp 18   Ht 5\' 6"  (1.676 m)   Wt 65.8 kg   SpO2 94%   BMI 23.40 kg/m   Intake/Output Summary (Last 24 hours) at 08/10/2020 0905 Last data filed at 08/10/2020 0600 Gross per 24 hour  Intake 2286.6 ml  Output 650 ml  Net 1636.6 ml    NAD RRR CVL in place Unlabored Abdomen soft, non-tender, non-distended Laparotomy and groin incisions clean, dry, intact 2+ DP BLE  CBC Latest Ref Rng & Units 08/09/2020 08/08/2020 08/07/2020  WBC 4.0 - 10.5 K/uL 30.0(H) 28.8(H) 28.7(H)  Hemoglobin 12.0 - 15.0 g/dL 10.7(L) 11.5(L) 11.3(L)  Hematocrit 36.0 - 46.0 % 33.0(L) 34.9(L) 33.8(L)  Platelets 150 - 400 K/uL 244 290 294     CMP Latest Ref Rng & Units 08/09/2020 08/08/2020 08/07/2020  Glucose 70 - 99 mg/dL 08/09/2020) 627(O) 350(K)  BUN 6 - 20 mg/dL 9 7 7   Creatinine 0.44 - 1.00 mg/dL 938(H 8.29  Sodium 135 - 145 mmol/L 137 138 139  Potassium 3.5 - 5.1 mmol/L 4.0 4.3 3.9  Chloride 98 - 111 mmol/L 103 106 109  CO2 22 - 32 mmol/L 24 24 22   Calcium 8.9 - 10.3 mg/dL 8.2(L) 8.4(L) 8.3(L)  Total Protein 6.5 - 8.1 g/dL - 5.8(L) -  Total Bilirubin 0.3 - 1.2 mg/dL - 0.8 -  Alkaline Phos 38 - 126 U/L - 62 -  AST 15 - 41 U/L - 22 -  ALT 0 - 44 U/L - 17 -    Estimated Creatinine Clearance:  82.3 mL/min (by C-G formula based on SCr of 0.64 mg/dL).  9.37. 1.69, MD [facility] Phone Number: [phone] 08/10/2020 9:05 AM

## 2020-08-10 NOTE — Progress Notes (Signed)
Patient's right IJ removed per MD order without difficulty.  Pt educated on bed rest and BP's being cycled.  Will continue to monitor.

## 2020-08-10 NOTE — Progress Notes (Signed)
Mobility Specialist - Progress Note   08/10/20 1041  Mobility  Activity Ambulated in hall  Level of Assistance Standby assist, set-up cues, supervision of patient - no hands on  Assistive Device Front wheel walker  Distance Ambulated (ft) 450 ft  Mobility Response Tolerated well  Mobility performed by Mobility specialist  $Mobility charge 1 Mobility   Pt asx throughout ambulation. She expressed happiness she is ambulating w/o pain. HR remained ~113 throughout, RR in low 20s. Pt sitting up on edge of bed after walk.   Mamie Levers Mobility Specialist Mobility Specialist Phone: [phone]

## 2020-08-11 LAB — BASIC METABOLIC PANEL
Anion gap: 13 (ref 5–15)
BUN: 5 mg/dL — ABNORMAL LOW (ref 6–20)
CO2: 20 mmol/L — ABNORMAL LOW (ref 22–32)
Calcium: 8.1 mg/dL — ABNORMAL LOW (ref 8.9–10.3)
Chloride: 104 mmol/L (ref 98–111)
Creatinine, Ser: 0.67 mg/dL (ref 0.44–1.00)
GFR, Estimated: >60 mL/min (ref >60)
Glucose, Bld: 122 mg/dL — ABNORMAL HIGH (ref 70–99)
Potassium: 3.5 mmol/L (ref 3.5–5.1)
Sodium: 137 mmol/L (ref 135–145)

## 2020-08-11 LAB — CBC
HCT: 27.9 % — ABNORMAL LOW (ref 36.0–46.0)
Hemoglobin: 9.7 g/dL — ABNORMAL LOW (ref 12.0–15.0)
MCH: 32.8 pg (ref 26.0–34.0)
MCHC: 34.8 g/dL (ref 30.0–36.0)
MCV: 94.3 fL (ref 80.0–100.0)
Platelets: 258 10*3/uL (ref 150–400)
RBC: 2.96 MIL/uL — ABNORMAL LOW (ref 3.87–5.11)
RDW: 12.7 % (ref 11.5–15.5)
WBC: 17 10*3/uL — ABNORMAL HIGH (ref 4.0–10.5)
nRBC: 0 % (ref 0.0–0.2)

## 2020-08-11 LAB — GLUCOSE, CAPILLARY
Glucose-Capillary: 109 mg/dL — ABNORMAL HIGH (ref 70–99)
Glucose-Capillary: 113 mg/dL — ABNORMAL HIGH (ref 70–99)
Glucose-Capillary: 119 mg/dL — ABNORMAL HIGH (ref 70–99)
Glucose-Capillary: 120 mg/dL — ABNORMAL HIGH (ref 70–99)
Glucose-Capillary: 121 mg/dL — ABNORMAL HIGH (ref 70–99)
Glucose-Capillary: 122 mg/dL — ABNORMAL HIGH (ref 70–99)
Glucose-Capillary: 136 mg/dL — ABNORMAL HIGH (ref 70–99)
Glucose-Capillary: 227 mg/dL — ABNORMAL HIGH (ref 70–99)

## 2020-08-11 MED ORDER — INSULIN PUMP
Freq: Three times a day (TID) | SUBCUTANEOUS | Status: DC
  Administered 2020-08-11: 2.06 via SUBCUTANEOUS
  Administered 2020-08-11: 1.6 via SUBCUTANEOUS
  Administered 2020-08-12: 3.43 via SUBCUTANEOUS
  Administered 2020-08-12: 2 via SUBCUTANEOUS
  Administered 2020-08-13: 0.65 via SUBCUTANEOUS
  Administered 2020-08-17: 18.53 via SUBCUTANEOUS
  Filled 2020-08-11: qty 1

## 2020-08-11 MED ORDER — HYDROMORPHONE 1 MG/ML IV SOLN: INTRAVENOUS | Status: DC

## 2020-08-11 MED ORDER — SODIUM CHLORIDE 0.9% FLUSH: 9.0000 mL | INTRAVENOUS | Status: DC | PRN

## 2020-08-11 MED ORDER — MAGNESIUM CITRATE PO SOLN
300.0000 mL | Freq: Once | ORAL | Status: AC
  Administered 2020-08-11: 300 mL via ORAL
  Filled 2020-08-11: qty 592

## 2020-08-11 MED ORDER — ONDANSETRON HCL 4 MG/2ML IJ SOLN
4.0000 mg | Freq: Four times a day (QID) | INTRAMUSCULAR | Status: DC | PRN
  Administered 2020-08-12 – 2020-08-13 (×4): 4 mg via INTRAVENOUS
  Filled 2020-08-11 (×4): qty 2

## 2020-08-11 MED ORDER — LEVOTHYROXINE SODIUM 50 MCG PO TABS
50.0000 ug | ORAL_TABLET | ORAL | Status: DC
  Administered 2020-08-12 – 2020-08-16 (×5): 50 ug via ORAL
  Filled 2020-08-11 (×5): qty 1

## 2020-08-11 MED ORDER — CLOPIDOGREL BISULFATE 75 MG PO TABS
75.0000 mg | ORAL_TABLET | Freq: Every day | ORAL | Status: DC
  Administered 2020-08-11 – 2020-08-17 (×6): 75 mg via ORAL
  Filled 2020-08-11 (×6): qty 1

## 2020-08-11 MED ORDER — HYDROMORPHONE 1 MG/ML IV SOLN
INTRAVENOUS | Status: DC
  Administered 2020-08-11: 30 mg via INTRAVENOUS
  Administered 2020-08-12: 0.3 mg via INTRAVENOUS
  Administered 2020-08-12: 1.5 mg via INTRAVENOUS
  Administered 2020-08-12: 3 mg via INTRAVENOUS
  Administered 2020-08-13: 0.3 mg via INTRAVENOUS
  Filled 2020-08-11: qty 30

## 2020-08-11 MED ORDER — NALOXONE HCL 0.4 MG/ML IJ SOLN: 0.4000 mg | INTRAMUSCULAR | Status: DC | PRN

## 2020-08-11 MED ORDER — PANCRELIPASE (LIP-PROT-AMYL) 16000-57500 UNITS PO CPEP: 1.0000 | ORAL_CAPSULE | Freq: Three times a day (TID) | ORAL | Status: DC

## 2020-08-11 MED ORDER — OXYCODONE HCL 5 MG PO TABS
5.0000 mg | ORAL_TABLET | Freq: Three times a day (TID) | ORAL | Status: DC | PRN
  Administered 2020-08-11 (×2): 5 mg via ORAL
  Filled 2020-08-11 (×2): qty 1

## 2020-08-11 MED ORDER — HYDROMORPHONE HCL 1 MG/ML IJ SOLN
0.5000 mg | INTRAMUSCULAR | Status: DC | PRN
  Administered 2020-08-11 – 2020-08-17 (×15): 0.5 mg via INTRAVENOUS
  Filled 2020-08-11 (×17): qty 1

## 2020-08-11 MED ORDER — ASPIRIN EC 81 MG PO TBEC
81.0000 mg | DELAYED_RELEASE_TABLET | Freq: Every day | ORAL | Status: DC
  Administered 2020-08-11 – 2020-08-17 (×5): 81 mg via ORAL
  Filled 2020-08-11 (×6): qty 1

## 2020-08-11 MED ORDER — PANCRELIPASE (LIP-PROT-AMYL) 12000-38000 UNITS PO CPEP
12000.0000 [IU] | ORAL_CAPSULE | Freq: Three times a day (TID) | ORAL | Status: DC
  Administered 2020-08-11 – 2020-08-16 (×3): 12000 [IU] via ORAL
  Filled 2020-08-11 (×6): qty 1

## 2020-08-11 MED FILL — Sodium Chloride IV Soln 0.9%: INTRAVENOUS | Qty: 2000 | Status: AC

## 2020-08-11 MED FILL — Heparin Sodium (Porcine) Inj 1000 Unit/ML: INTRAMUSCULAR | Qty: 30 | Status: AC

## 2020-08-11 NOTE — Progress Notes (Signed)
Occupational Therapy Treatment Patient Details Name: Jessica [name] MRN: [id number] DOB: Nov 22, 1975 Today's Date: 08/11/2020    History of present illness 45 y.o. female previous history of right common iliac artery stenting on 2 occasions previous left femoropopliteal bypass. 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  Recently she had stopped her Plavix to have her tube exchanged. Presented with skin changes in her R lateral foot with pain and pain with walking. 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   OT comments  Pt at this time was sitting at EOB. Pt was able to complete sit to stand transfer to walker with supervision due to lines at this time. Pt was able to complete standing tasks for about 10 mins and no changes in pain level. Pt at this time was able to complete unsupportive standing at EOB with no LOB. Pt at this time noted limited y fatigue at this time.  Pt currently with functional limitations due to the deficits listed below (see OT Problem List).  Pt will benefit from skilled OT to increase their safety and independence with ADL and functional mobility for ADL to facilitate discharge to venue listed below.     Follow Up Recommendations  No OT follow up    Equipment Recommendations  Tub/shower seat    Recommendations for Other Services      Precautions / Restrictions Precautions Precautions: Fall Restrictions Weight Bearing Restrictions: No       Mobility Bed Mobility Overal bed mobility:  (presented sitting EOB)                Transfers Overall transfer level: Modified independent Equipment used: Rolling walker (2 wheeled)   Sit to Stand: Supervision              Balance Overall balance assessment: Needs assistance Sitting-balance support: Feet supported;No upper extremity supported Sitting balance-Leahy Scale: Good                                     ADL either performed or assessed  with clinical judgement   ADL Overall ADL's : Needs assistance/impaired Eating/Feeding: Modified independent;Sitting   Grooming: Wash/dry hands;Wash/dry face;Set up;Sitting   Upper Body Bathing: Supervision/ safety;Sitting;Standing   Lower Body Bathing: Supervison/ safety;Cueing for safety;Cueing for sequencing;Sit to/from stand   Upper Body Dressing : Supervision/safety   Lower Body Dressing: Supervision/safety;Sit to/from stand   Toilet Transfer: Supervision/safety;Cueing for safety;Cueing for sequencing   Toileting- Clothing Manipulation and Hygiene: Supervision/safety;Sit to/from stand   Tub/ Shower Transfer: Min guard;Ambulation   Functional mobility during ADLs: Supervision/safety;Rolling walker General ADL Comments: multiple lines and leads     Vision       Perception     Praxis      Cognition Arousal/Alertness: Awake/alert Behavior During Therapy: WFL for tasks assessed/performed Overall Cognitive Status: Within Functional Limits for tasks assessed                                          Exercises     Shoulder Instructions       General Comments      Pertinent Vitals/ Pain       Pain Assessment: 0-10 Pain Score: 7  Pain Location: groin, R foot, abdomen, bilateral knee, hip joints with movement Pain Descriptors / Indicators: Grimacing;Guarding;Operative site guarding  Pain Intervention(s): Limited activity within patient's tolerance  Home Living                                          Prior Functioning/Environment              Frequency  Min 2X/week        Progress Toward Goals  OT Goals(current goals can now be found in the care plan section)  Progress towards OT goals: Progressing toward goals  Acute Rehab OT Goals Patient Stated Goal: get back home and be more active OT Goal Formulation: With patient Time For Goal Achievement: 08/22/20 ADL Goals Pt Will Perform Grooming: with modified  independence;standing;sitting Pt Will Perform Lower Body Bathing: with modified independence;sit to/from stand Pt Will Perform Lower Body Dressing: with modified independence;sit to/from stand Pt Will Transfer to Toilet: with modified independence;ambulating;regular height toilet Pt Will Perform Toileting - Clothing Manipulation and hygiene: with modified independence;sit to/from stand  Plan Discharge plan remains appropriate    Co-evaluation                 AM-PAC OT "6 Clicks" Daily Activity     Outcome Measure   Help from another person eating meals?: None     Help from another person bathing (including washing, rinsing, drying)?: A Little Help from another person to put on and taking off regular upper body clothing?: A Little Help from another person to put on and taking off regular lower body clothing?: A Little 6 Click Score: 13    End of Session Equipment Utilized During Treatment: Rolling walker  OT Visit Diagnosis: Unsteadiness on feet (R26.81);Pain   Activity Tolerance Patient limited by fatigue   Patient Left in bed;with call bell/phone within reach   Nurse Communication          Time: 8119-1478 OT Time Calculation (min): 24 min  Charges: OT General Charges $OT Visit: 1 Visit OT Treatments $Self Care/Home Management : 23-37 mins  Alphia Moh OTR/L  Acute Rehab Services  [phone] office number [phone] pager number    Alphia Moh 08/11/2020, 10:18 AM

## 2020-08-11 NOTE — Care Management Important Message (Signed)
Important Message  Patient Details  Name: Jessica [name] MRN: [id number] Date of Birth: Jun 05, 1976   Medicare Important Message Given:  Yes     Iris Stefan Church 08/11/2020, 4:27 PM

## 2020-08-11 NOTE — Progress Notes (Addendum)
  Progress Note    08/11/2020 7:35 AM 4 Days Post-Op  Subjective:  Abdominal pain overnight. States they gave her suppository last night and has been hurting since. No BM since 2/17. She is passing flatus now. Having some pain in right 4th toe   Vitals:   08/11/20 0311 08/11/20 0410  BP: 127/73   Pulse: 75   Resp: 11 20  Temp: 98.6 F (37 C)   SpO2: 96% 96%   Physical Exam: Cardiac: regular Lungs: non labored, on [place] Incisions:  Bilateral groin incisions and midline incision c/d/i healing well Extremities: well perfused and warm with palpable DP pulses bilaterally Abdomen:  Soft, non distended, expected tenderness along incision Neurologic: alert and oriented  CBC    Component Value Date/Time   WBC 17.0 (H) 08/11/2020 0151   RBC 2.96 (L) 08/11/2020 0151   HGB 9.7 (L) 08/11/2020 0151   HCT 27.9 (L) 08/11/2020 0151   PLT 258 08/11/2020 0151   MCV 94.3 08/11/2020 0151   MCH 32.8 08/11/2020 0151   MCHC 34.8 08/11/2020 0151   RDW 12.7 08/11/2020 0151    BMET    Component Value Date/Time   NA 137 08/11/2020 0151   K 3.5 08/11/2020 0151   CL 104 08/11/2020 0151   CO2 20 (L) 08/11/2020 0151   GLUCOSE 122 (H) 08/11/2020 0151   BUN 5 (L) 08/11/2020 0151   CREATININE 0.67 08/11/2020 0151   CALCIUM 8.1 (L) 08/11/2020 0151   GFRNONAA >60 08/11/2020 0151   GFRAA >90 05/10/2014 1100    INR    Component Value Date/Time   INR 1.3 (H) 08/07/2020 1247     Intake/Output Summary (Last 24 hours) at 08/11/2020 0735 Last data filed at 08/11/2020 0432 Gross per 24 hour  Intake 2296.09 ml  Output 400 ml  Net 1896.09 ml     Assessment/Plan:  45 y.o. female is s/p aortobifemoral bypass 4 Days Post-Op. Lower extremities are well perfused and warm with palpable pulses.  Tolerating ambulation with mobility specialist  Wean from PCA as able - still using it quite a bit. Hopefully transition to oral today once taking po. Encouraged as this is likely causing delays in return of  bowel function Multimodal pain regimen PT / OT / OOB / Ambulate Continue for sips of clears. GJ in place. G to gravity. Clamp J. Await return of bowel function. No improvement with suppository. Will try mag citrate this morning. Hopeful to advance diet later today if she can have BM WBC trending down 30k >18.9> 17k now Continue insulin infusion. BG goal 120-180. Once no longer npo will transition to her insulin pump    Graceann Congress, [place] [facility] [phone] 08/11/2020 7:35 AM    Addendum: Patient passing Flatus. Had BM this morning after mag citrate. DM coordinator saw patient and we will transition her back to her Insulin pump. Okay for tube feedings to restart and patient can have diet as tolerated  VASCULAR STAFF ADDENDUM: I have independently interviewed and examined the patient. I agree with the above.  Looks great. Bowel function returned. Resume TF and start regular diet. KVO IVF. Stop PCA. Transition to PRNs. May be ready for DC tomorrow.  Rande Brunt. Lenell Antu, MD [facility] Phone Number: [phone] 08/11/2020 1:53 PM

## 2020-08-11 NOTE — Progress Notes (Signed)
Mobility Specialist - Progress Note   08/11/20 1432  Mobility  Activity Ambulated in hall  Level of Assistance Standby assist, set-up cues, supervision of patient - no hands on  Assistive Device Front wheel walker  Distance Ambulated (ft) 470 ft  Mobility Response Tolerated well  Mobility performed by Mobility specialist  $Mobility charge 1 Mobility   Pt states she'd like to try ambulating w/ a rollator next time and is interested in having one for home. Pt asx throughout ambulation. VSS.   Mamie Levers Mobility Specialist Mobility Specialist Phone: [phone]

## 2020-08-11 NOTE — Progress Notes (Signed)
Physical Therapy Treatment Patient Details Name: Jessica [name] MRN: [id number] DOB: [birth year] Today's Date: 08/11/2020    History of Present Illness 45 y.o. female previous history of right common iliac artery stenting on 2 occasions previous left femoropopliteal bypass. 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  Recently she had stopped her Plavix to have her tube exchanged. Presented with skin changes in her R lateral foot with pain and pain with walking. 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    PT Comments    Pt received seated EOB, pleasantly cooperative and with improved activity tolerance this date compared with previous session, making good progress toward goals. Pt requiring less assist this date, able to progress gait distance to 1034ft using RW and Supervision, VSS. Will plan to initiate stair training and bring HEP handout next session. Continue to recommend HHPT, DME recs updated per pt progress and anticipate rollator will be more helpful to her in community, discussed with supervising PT Lanora Manis [facility].  Follow Up Recommendations  Home health PT;Supervision - Intermittent     Equipment Recommendations  Other (comment);3in1 (PT) (pt prefers rollator to RW)    Recommendations for Other Services       Precautions / Restrictions Precautions Precautions: Fall Precaution Comments: J-tube Restrictions Weight Bearing Restrictions: No    Mobility  Bed Mobility Overal bed mobility:  (presented sitting EOB)             General bed mobility comments: verbally reviewed log rolling for abdominal protection; wanted to sit EOB at end of session    Transfers Overall transfer level: Needs assistance Equipment used: Rolling walker (2 wheeled)   Sit to Stand: Supervision         General transfer comment: cues for line mgmt/awareness at times but no physical assist needed  Ambulation/Gait Ambulation/Gait assistance:  Supervision Gait Distance (Feet): 1000 Feet Assistive device: Rolling walker (2 wheeled) Gait Pattern/deviations: Step-through pattern;Decreased stride length Gait velocity: improved; grossly <0.6 m/s   General Gait Details: improved posture, good RW use, assist with lines only, fair cadence; grossly 0.4-0.6 m/s speed   Stairs             Wheelchair Mobility    Modified Rankin (Stroke Patients Only)       Balance Overall balance assessment: Needs assistance Sitting-balance support: Feet supported;No upper extremity supported Sitting balance-Leahy Scale: Good     Standing balance support: Bilateral upper extremity supported;During functional activity;No upper extremity supported Standing balance-Leahy Scale: Fair Standing balance comment: pt able to static stand unsupported no LOB but light UE reliance on RW during mobility, no LOB                            Cognition Arousal/Alertness: Awake/alert Behavior During Therapy: WFL for tasks assessed/performed Overall Cognitive Status: Within Functional Limits for tasks assessed                                        Exercises      General Comments General comments (skin integrity, edema, etc.): HR 90's-110 bpm during mobility, SpO2 WNL on RA      Pertinent Vitals/Pain Pain Assessment: 0-10 Pain Score: 7  Pain Location: surgical site/abdomen Pain Descriptors / Indicators: Grimacing;Operative site guarding;Sore Pain Intervention(s): Monitored during session;Repositioned    Home Living  Prior Function            PT Goals (current goals can now be found in the care plan section) Acute Rehab PT Goals Patient Stated Goal: get back home and be more active PT Goal Formulation: With patient Time For Goal Achievement: 08/21/20 Potential to Achieve Goals: Good Progress towards PT goals: Progressing toward goals    Frequency    Min 3X/week      PT  Plan Equipment recommendations need to be updated;Discharge plan needs to be updated    Co-evaluation              AM-PAC PT "6 Clicks" Mobility   Outcome Measure  Help needed turning from your back to your side while in a flat bed without using bedrails?: A Little Help needed moving from lying on your back to sitting on the side of a flat bed without using bedrails?: A Little Help needed moving to and from a bed to a chair (including a wheelchair)?: A Little Help needed standing up from a chair using your arms (e.g., wheelchair or bedside chair)?: A Little Help needed to walk in hospital room?: A Little Help needed climbing 3-5 steps with a railing? : A Little 6 Click Score: 18    End of Session   Activity Tolerance: Patient tolerated treatment well Patient left: in bed;with call bell/phone within reach (seated EOB) Nurse Communication: Mobility status PT Visit Diagnosis: Other abnormalities of gait and mobility (R26.89);Muscle weakness (generalized) (M62.81);Difficulty in walking, not elsewhere classified (R26.2);Pain Pain - part of body:  (abdomen/groin)     Time: 9767-3419 PT Time Calculation (min) (ACUTE ONLY): 17 min  Charges:  $Gait Training: 8-22 mins                     Bryant Saye P., PTA Acute Rehabilitation Services Pager: [phone] Office: [phone]   Angus Palms 08/11/2020, 2:48 PM

## 2020-08-11 NOTE — Progress Notes (Signed)
Emptied 750 ml green bile and fluid from j tube.  Pt concerned that her medications were going straight to bag. Stated that mag citrate went straight to bag.  I asked her what she did at home and she stated that she kept it clamped when not getting nocturnal feeding. Will contact MD and make aware that may need to be clamped now that bowel function has returned.

## 2020-08-11 NOTE — Progress Notes (Addendum)
Inpatient Diabetes Program Recommendations  AACE/ADA: New Consensus Statement on Inpatient Glycemic Control (2015)  Target Ranges:  Prepandial:   less than 140 mg/dL      Peak postprandial:   less than 180 mg/dL (1-2 hours)      Critically ill patients:  140 - 180 mg/dL   Lab Results  Component Value Date   GLUCAP 122 (H) 08/11/2020   HGBA1C 8.5 (H) 08/05/2020    Review of Glycemic Control  Diabetes history: DM Outpatient Diabetes medications: Insulin pump Current orders for Inpatient glycemic control: IV insulin  When ready to transition to pump, please have patient apply pump 1 hour prior to stopping IV insulin.  MD can order insulin pump order set and RN can print off insulin pump log and contract.  Patient must be alert, oriented and able to independently apply and operate insulin pump.  Will continue to follow while inpatient.  Addendum 1:23 pm: Rounded on pt. Pt has insulin pump inserted, connected and infusing. IV insulin stopped in room. Started around 12:30 pm inserted in the right lower quadrant. Pt will insert dexcom CGM in one of her arms when brought in by husband. Pt reports tube feeds at night and picking at food during the day. Pt reports she knows how to bolus herself via her pum for the tube feeding. She reported to me her Tube Feeds has 44 grams of carbs.  Pt sees Dr. Fredric Mare in [facility] for DM management outpatient.  Thank you, Christena Deem RN, MSN, BC-ADM Inpatient Diabetes Coordinator Team Pager [phone] (8a-5p)

## 2020-08-11 NOTE — Progress Notes (Signed)
The patient was complaining of pain level of 9 out of 10 at her incision sites and that the new as needed orders for Oxycodone 5 mg and IV Dilaudid 0.5 mg wasn't helping to control her pain.  Informed the on call physician, Dr. Darrick Penna, and he informed the nurse to restart the patient on the PCA pump.  The patient was put back on the Dilaudid PCA pump and she states her pain is starting to come down to a level 8.  Patient is resting comfortably in bed.  Will continue to monitor.  Harriet Masson, RN

## 2020-08-12 LAB — BASIC METABOLIC PANEL
Anion gap: 9 (ref 5–15)
BUN: 5 mg/dL — ABNORMAL LOW (ref 6–20)
CO2: 26 mmol/L (ref 22–32)
Calcium: 8 mg/dL — ABNORMAL LOW (ref 8.9–10.3)
Chloride: 104 mmol/L (ref 98–111)
Creatinine, Ser: 0.56 mg/dL (ref 0.44–1.00)
GFR, Estimated: >60 mL/min (ref >60)
Glucose, Bld: 221 mg/dL — ABNORMAL HIGH (ref 70–99)
Potassium: 3 mmol/L — ABNORMAL LOW (ref 3.5–5.1)
Sodium: 139 mmol/L (ref 135–145)

## 2020-08-12 LAB — CBC
HCT: 26.8 % — ABNORMAL LOW (ref 36.0–46.0)
Hemoglobin: 9.5 g/dL — ABNORMAL LOW (ref 12.0–15.0)
MCH: 32.9 pg (ref 26.0–34.0)
MCHC: 35.4 g/dL (ref 30.0–36.0)
MCV: 92.7 fL (ref 80.0–100.0)
Platelets: 288 10*3/uL (ref 150–400)
RBC: 2.89 MIL/uL — ABNORMAL LOW (ref 3.87–5.11)
RDW: 12.5 % (ref 11.5–15.5)
WBC: 14.8 10*3/uL — ABNORMAL HIGH (ref 4.0–10.5)
nRBC: 0.1 % (ref 0.0–0.2)

## 2020-08-12 LAB — GLUCOSE, CAPILLARY
Glucose-Capillary: 70 mg/dL (ref 70–99)
Glucose-Capillary: 112 mg/dL — ABNORMAL HIGH (ref 70–99)
Glucose-Capillary: 111 mg/dL — ABNORMAL HIGH (ref 70–99)

## 2020-08-12 MED ORDER — OXYCODONE HCL 5 MG PO TABS
5.0000 mg | ORAL_TABLET | ORAL | Status: DC | PRN
  Administered 2020-08-12: 5 mg via ORAL
  Filled 2020-08-12: qty 1

## 2020-08-12 MED ORDER — PANTOPRAZOLE SODIUM 40 MG PO TBEC
40.0000 mg | DELAYED_RELEASE_TABLET | Freq: Every day | ORAL | Status: DC
  Administered 2020-08-12 – 2020-08-16 (×3): 40 mg via ORAL
  Filled 2020-08-12 (×4): qty 1

## 2020-08-12 MED ORDER — ALUM & MAG HYDROXIDE-SIMETH 200-200-20 MG/5ML PO SUSP
30.0000 mL | Freq: Four times a day (QID) | ORAL | Status: DC | PRN
  Administered 2020-08-12: 30 mL via ORAL
  Filled 2020-08-12: qty 30

## 2020-08-12 NOTE — Progress Notes (Signed)
Inpatient Diabetes Program Recommendations  AACE/ADA: New Consensus Statement on Inpatient Glycemic Control (2015)  Target Ranges:  Prepandial:   less than 140 mg/dL      Peak postprandial:   less than 180 mg/dL (1-2 hours)      Critically ill patients:  140 - 180 mg/dL   Lab Results  Component Value Date   GLUCAP 227 (H) 08/11/2020   HGBA1C 8.5 (H) 08/05/2020    Review of Glycemic Control Results for MONSERRATE, BLASCHKE (MRN [id number]) as of 08/12/2020 09:32  Ref. Range 08/11/2020 11:09 08/11/2020 14:20 08/11/2020 23:33  Glucose-Capillary Latest Ref Range: 70 - 99 mg/dL 301 (H) 601 (H) 093 (H)    Diabetes history: Type 1 DM Outpatient Diabetes medications: Insulin pump Current orders for Inpatient glycemic control: Insulin pump  Inpatient Diabetes Program Recommendations:    Appears patient did not have overlap with insulin pump from IV insulin transition. Thus, glucose trends slightly elevated. Patient is bolusing in an attempt to bring down CBGs. Secure chat sent to RN to verify AM CBG. Following.   Thanks, Lujean Rave, MSN, RNC-OB Diabetes Coordinator [phone] (8a-5p)

## 2020-08-12 NOTE — Progress Notes (Signed)
Patient stated that for nearly all day her abdominal area has been hurting and she feels nauseous.  States it "feels like pressure in the belly." She had a small bowel movement on 08/11/20 after receiving magnesium citrate.  She was passing gas before but today she has been burping.  Auscultated bowel sounds were hypoactive. Informed the on call vascular surgeon, who stated he did not want to try to force her to have a bowel movement at this time.  The doctor said to order PRN oral Maalox 30 mL for indigestion and gas.  Will continue to monitor.  Harriet Masson, RN

## 2020-08-12 NOTE — Progress Notes (Signed)
Mobility Specialist: Progress Note   08/12/20 1340  Mobility  Activity Ambulated in hall  Level of Assistance Modified independent, requires aide device or extra time  Assistive Device Front wheel walker  Distance Ambulated (ft) 610 ft  Mobility Response Tolerated well  Mobility performed by Mobility specialist  $Mobility charge 1 Mobility   Pre-Mobility: 89 HR, 96% SpO2 Post-Mobility: 70 HR, 140/72 BP, 96% SpO2  Pt said she had a rough night but was agreeable to ambulate. Pt c/o "ache" in groin during ambulation, no rating given. Pt to bed after walk per request.   Cristal Deer Luisdaniel Kenton Mobility Specialist Mobility Specialist Phone: [phone]

## 2020-08-12 NOTE — Progress Notes (Addendum)
  Progress Note    08/12/2020 7:47 AM 5 Days Post-Op  Subjective:  PCA restarted last night   Vitals:   08/12/20 0001 08/12/20 0457  BP:    Pulse:    Resp: 20 18  Temp:    SpO2: 96% 97%   Physical Exam: Lungs:  Non labored Incisions:  abd and groin incisions c/d/i Extremities:  Symmetrical DP pulses; R lateral foot wound dry with no sign of infection Neurologic: A&O  CBC    Component Value Date/Time   WBC 14.8 (H) 08/12/2020 0053   RBC 2.89 (L) 08/12/2020 0053   HGB 9.5 (L) 08/12/2020 0053   HCT 26.8 (L) 08/12/2020 0053   PLT 288 08/12/2020 0053   MCV 92.7 08/12/2020 0053   MCH 32.9 08/12/2020 0053   MCHC 35.4 08/12/2020 0053   RDW 12.5 08/12/2020 0053    BMET    Component Value Date/Time   NA 139 08/12/2020 0053   K 3.0 (L) 08/12/2020 0053   CL 104 08/12/2020 0053   CO2 26 08/12/2020 0053   GLUCOSE 221 (H) 08/12/2020 0053   BUN 5 (L) 08/12/2020 0053   CREATININE 0.56 08/12/2020 0053   CALCIUM 8.0 (L) 08/12/2020 0053   GFRNONAA >60 08/12/2020 0053   GFRAA >90 05/10/2014 1100    INR    Component Value Date/Time   INR 1.3 (H) 08/07/2020 1247     Intake/Output Summary (Last 24 hours) at 08/12/2020 0747 Last data filed at 08/11/2020 1700 Gross per 24 hour  Intake 480 ml  Output -  Net 480 ml     Assessment/Plan:  45 y.o. female is s/p ABF bypass 5 Days Post-Op   BLE well perfused with symmetrical DP pulses Encouraged OOB Tolerating tube feed and regular diet TOC arranging HH PT Try to wean PCA again today; home when no longer requiring IV pain medication   Emilie Rutter, PA-C [facility] [phone] 08/12/2020 7:47 AM  VASCULAR STAFF ADDENDUM: I have independently interviewed and examined the patient. I agree with the above.  Needed to restart PCA last night. Will try to wean again today.  Continue diet as tolerated.  Encourage mobilization.  Rande Brunt. Lenell Antu, MD [facility] Phone Number: [phone] 08/12/2020 8:44 AM

## 2020-08-13 ENCOUNTER — Inpatient Hospital Stay (HOSPITAL_COMMUNITY): Payer: Medicare (Managed Care)

## 2020-08-13 LAB — GLUCOSE, CAPILLARY
Glucose-Capillary: 132 mg/dL — ABNORMAL HIGH (ref 70–99)
Glucose-Capillary: 132 mg/dL — ABNORMAL HIGH (ref 70–99)
Glucose-Capillary: 133 mg/dL — ABNORMAL HIGH (ref 70–99)
Glucose-Capillary: 132 mg/dL — ABNORMAL HIGH (ref 70–99)

## 2020-08-13 LAB — CBC
HCT: 29.8 % — ABNORMAL LOW (ref 36.0–46.0)
Hemoglobin: 10 g/dL — ABNORMAL LOW (ref 12.0–15.0)
MCH: 31.4 pg (ref 26.0–34.0)
MCHC: 33.6 g/dL (ref 30.0–36.0)
MCV: 93.7 fL (ref 80.0–100.0)
Platelets: 344 10*3/uL (ref 150–400)
RBC: 3.18 MIL/uL — ABNORMAL LOW (ref 3.87–5.11)
RDW: 13 % (ref 11.5–15.5)
WBC: 16.8 10*3/uL — ABNORMAL HIGH (ref 4.0–10.5)
nRBC: 0 % (ref 0.0–0.2)

## 2020-08-13 LAB — BASIC METABOLIC PANEL
Anion gap: 9 (ref 5–15)
BUN: <5 mg/dL — ABNORMAL LOW (ref 6–20)
CO2: 29 mmol/L (ref 22–32)
Calcium: 8.3 mg/dL — ABNORMAL LOW (ref 8.9–10.3)
Chloride: 103 mmol/L (ref 98–111)
Creatinine, Ser: 0.54 mg/dL (ref 0.44–1.00)
GFR, Estimated: >60 mL/min (ref >60)
Glucose, Bld: 105 mg/dL — ABNORMAL HIGH (ref 70–99)
Potassium: 3.2 mmol/L — ABNORMAL LOW (ref 3.5–5.1)
Sodium: 141 mmol/L (ref 135–145)

## 2020-08-13 MED ORDER — SENNOSIDES-DOCUSATE SODIUM 8.6-50 MG PO TABS
1.0000 | ORAL_TABLET | Freq: Two times a day (BID) | ORAL | Status: DC
  Administered 2020-08-13: 1 via ORAL
  Filled 2020-08-13 (×3): qty 1

## 2020-08-13 MED ORDER — PROMETHAZINE HCL 25 MG PO TABS
25.0000 mg | ORAL_TABLET | Freq: Four times a day (QID) | ORAL | Status: DC | PRN
  Administered 2020-08-13 (×3): 25 mg via ORAL
  Filled 2020-08-13 (×4): qty 1

## 2020-08-13 NOTE — Progress Notes (Signed)
Mobility Specialist: Progress Note   08/13/20 1517  Mobility  Activity Refused mobility   Pt refused mobility stating she feels better but is still nauseated. Encouraged pt to walk with PT later.   [facility] Journey Ratterman Mobility Specialist Mobility Specialist Phone: [phone]

## 2020-08-13 NOTE — Progress Notes (Signed)
PT Cancellation Note  Patient Details Name: JALAYIAH BIBIAN MRN: [id number] DOB: [birth year]   Cancelled Treatment:    Reason Eval/Treat Not Completed: (P) Medical issues which prohibited therapy (RN defer in AM due to nausea after x-ray and unable to return in PM due to schedule.) Will continue efforts per PT POC as schedule permits.   Dorathy Kinsman Jalan Fariss 08/13/2020, 4:36 PM

## 2020-08-13 NOTE — Progress Notes (Addendum)
Patient wanting PCA discontinued. As requested dcd PCA pump.  Medication wasted in Pyxis machine with another RN.  Patient extremely nauseated still this AM. Patient with bowel sounds in all 4 quadrants at this time and patient reported small BM 08/12/20. Phenergan was given as ordered as needed for nausea. Will continue to monitor. Chesney Suares, Randall An, RN   1020- Lianne Cure [facility] made aware of patient nausea and DC of PCA. Orders received. Will monitor Brysin Towery, Randall An rN

## 2020-08-13 NOTE — Progress Notes (Signed)
Pt was given a Doculax suppository but after an hour had only a very small bowl movement.  Afterwards, the patient suddenly threw up clear liquid with some undigested food.  The patient stated her abdomen still hurt but she was no longer nauseous.  Pt resting in bed.  Will continue to monitor  Harriet Masson, RN

## 2020-08-13 NOTE — Progress Notes (Addendum)
     Subjective  - CC right foot and 4th toe pain with lateral wound.   Objective 124/68 73 98.4 F (36.9 C) (Oral) 14 95%  Intake/Output Summary (Last 24 hours) at 08/13/2020 0865 Last data filed at 08/12/2020 2325 Gross per 24 hour  Intake 100 ml  Output -  Net 100 ml    Palpable pedal pulses B, motor and increased sensation intact Abdomin soft, incision healing well, B groins incisions healing well Lungs non labored breathing   Assessment/Planning: POD # 6 45 y.o. female is s/p ABF bypass  Incisions healing well, feet well perfused. Plan for right foot x ray to check for signs of infection Plan to wean off PCA, increase mobility Possible discharge Friday.  Mosetta Pigeon 08/13/2020 7:38 AM --  Laboratory Lab Results: Recent Labs    08/12/20 0053 08/13/20 0124  WBC 14.8* 16.8*  HGB 9.5* 10.0*  HCT 26.8* 29.8*  PLT 288 344   BMET Recent Labs    08/12/20 0053 08/13/20 0124  NA 139 141  K 3.0* 3.2*  CL 104 103  CO2 26 29  GLUCOSE 221* 105*  BUN 5* <5*  CREATININE 0.56 0.54  CALCIUM 8.0* 8.3*    COAG Lab Results  Component Value Date   INR 1.3 (H) 08/07/2020   INR 1.1 08/05/2020   No results found for: PTT   I have independently interviewed and examined patient and agree with PA assessment and plan above. Xray without signs of osteo. Plan to wean pca.   Brandon C. Randie Heinz, MD [facility] [place] Office: [phone] Pager: [phone]

## 2020-08-14 LAB — BASIC METABOLIC PANEL
Anion gap: 10 (ref 5–15)
BUN: <5 mg/dL — ABNORMAL LOW (ref 6–20)
CO2: 28 mmol/L (ref 22–32)
Calcium: 8.6 mg/dL — ABNORMAL LOW (ref 8.9–10.3)
Chloride: 102 mmol/L (ref 98–111)
Creatinine, Ser: 0.66 mg/dL (ref 0.44–1.00)
GFR, Estimated: >60 mL/min (ref >60)
Glucose, Bld: 120 mg/dL — ABNORMAL HIGH (ref 70–99)
Potassium: 3.6 mmol/L (ref 3.5–5.1)
Sodium: 140 mmol/L (ref 135–145)

## 2020-08-14 LAB — CBC
HCT: 31.7 % — ABNORMAL LOW (ref 36.0–46.0)
Hemoglobin: 10.7 g/dL — ABNORMAL LOW (ref 12.0–15.0)
MCH: 31.8 pg (ref 26.0–34.0)
MCHC: 33.8 g/dL (ref 30.0–36.0)
MCV: 94.3 fL (ref 80.0–100.0)
Platelets: 378 10*3/uL (ref 150–400)
RBC: 3.36 MIL/uL — ABNORMAL LOW (ref 3.87–5.11)
RDW: 13.2 % (ref 11.5–15.5)
WBC: 17.7 10*3/uL — ABNORMAL HIGH (ref 4.0–10.5)
nRBC: 0 % (ref 0.0–0.2)

## 2020-08-14 LAB — GLUCOSE, CAPILLARY
Glucose-Capillary: 145 mg/dL — ABNORMAL HIGH (ref 70–99)
Glucose-Capillary: 126 mg/dL — ABNORMAL HIGH (ref 70–99)
Glucose-Capillary: 130 mg/dL — ABNORMAL HIGH (ref 70–99)
Glucose-Capillary: 126 mg/dL — ABNORMAL HIGH (ref 70–99)
Glucose-Capillary: 104 mg/dL — ABNORMAL HIGH (ref 70–99)
Glucose-Capillary: 105 mg/dL — ABNORMAL HIGH (ref 70–99)
Glucose-Capillary: 118 mg/dL — ABNORMAL HIGH (ref 70–99)

## 2020-08-14 MED ORDER — ACETAMINOPHEN 160 MG/5ML PO SOLN: 650.0000 mg | Freq: Four times a day (QID) | ORAL | Status: DC | PRN

## 2020-08-14 MED ORDER — HYDROCODONE-ACETAMINOPHEN 7.5-325 MG/15ML PO SOLN
10.0000 mL | Freq: Four times a day (QID) | ORAL | Status: DC | PRN
Start: 2020-08-14 — End: 2020-08-17
  Administered 2020-08-14: 10 mL
  Filled 2020-08-14: qty 15

## 2020-08-14 MED ORDER — ONDANSETRON HCL 4 MG/2ML IJ SOLN
4.0000 mg | Freq: Four times a day (QID) | INTRAMUSCULAR | Status: DC | PRN
  Administered 2020-08-14 – 2020-08-15 (×4): 4 mg via INTRAVENOUS
  Filled 2020-08-14 (×4): qty 2

## 2020-08-14 MED ORDER — PROMETHAZINE HCL 25 MG/ML IJ SOLN
25.0000 mg | Freq: Four times a day (QID) | INTRAMUSCULAR | Status: DC | PRN
  Administered 2020-08-14 – 2020-08-15 (×4): 25 mg via INTRAVENOUS
  Filled 2020-08-14 (×5): qty 1

## 2020-08-14 NOTE — Progress Notes (Signed)
G tube placed to gravity drain as directed by Dr. Randie Heinz this AM. J tube still clamped. Will monitor. Patient still with nausea. Jessica [name], Randall An RN

## 2020-08-14 NOTE — Progress Notes (Signed)
Physical Therapy Treatment Patient Details Name: GRATIA DISLA MRN: [id number] DOB: 1976/04/14 Today's Date: 08/14/2020    History of Present Illness 45 y.o. female previous history of right common iliac artery stenting on 2 occasions previous left femoropopliteal bypass. 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  Recently she had stopped her Plavix to have her tube exchanged. Presented with skin changes in her R lateral foot with pain and pain with walking. 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    PT Comments    Pt received in supine, agreeable to bed-level supine exercises due to persistent nausea but deferring EOB/OOB mobility. Pt with good participation and fair tolerance for BLE exercises, given HEP handout and verbal review for seated/standing exercises once she feels better, pt with good quad contraction and mostly limited due to nausea and abdominal discomfort. HEP handout (HEP: [email or web address] Access Code: [email or web address]). Will plan to assess stair trial next session if pt symptoms improve. Pt continues to benefit from PT services to progress toward functional mobility goals. Continue to recommend HHPT, pending progress.   Follow Up Recommendations  Home health PT;Supervision - Intermittent     Equipment Recommendations  Other (comment);3in1 (PT) (pt prefers rollator to RW)    Recommendations for Other Services       Precautions / Restrictions Precautions Precautions: Fall Precaution Comments: J-tube Restrictions Weight Bearing Restrictions: No    Mobility  Bed Mobility               General bed mobility comments: verbally reviewed log rolling for abdominal protection; pt defers EOB/OOB due to persistent nausea    Transfers                    Ambulation/Gait                 Stairs             Wheelchair Mobility    Modified Rankin (Stroke Patients Only)       Balance                                             Cognition Arousal/Alertness: Awake/alert Behavior During Therapy: WFL for tasks assessed/performed Overall Cognitive Status: Within Functional Limits for tasks assessed                                        Exercises General Exercises - Lower Extremity Ankle Circles/Pumps: AROM;Both;15 reps;Supine Quad Sets: AROM;Both;10 reps;Supine Gluteal Sets: AROM;Both;10 reps;Supine Heel Slides: AROM;Both;10 reps;Supine Hip ABduction/ADduction: AROM;Both;10 reps;Supine (needs cues not to turn feet/knees out) Straight Leg Raises: AROM;Right;5 reps;Supine (pt c/o increased discomfort after 5 reps, deferred further)    General Comments General comments (skin integrity, edema, etc.): SpO2 96% on RA, HR 61 during supine exercise and BP 137/69 taken ~1hr prior, did not reassess BP as pt remained in supine      Pertinent Vitals/Pain Pain Assessment: Faces Faces Pain Scale: Hurts little more Pain Location: surgical site/abdomen Pain Descriptors / Indicators: Grimacing;Operative site guarding;Sore Pain Intervention(s): Limited activity within patient's tolerance;Monitored during session    Home Living                      Prior Function  PT Goals (current goals can now be found in the care plan section) Acute Rehab PT Goals Patient Stated Goal: get back home and be more active PT Goal Formulation: With patient Time For Goal Achievement: 08/21/20 Potential to Achieve Goals: Good Progress towards PT goals: Progressing toward goals    Frequency    Min 3X/week      PT Plan Current plan remains appropriate    Co-evaluation              AM-PAC PT "6 Clicks" Mobility   Outcome Measure  Help needed turning from your back to your side while in a flat bed without using bedrails?: A Little Help needed moving from lying on your back to sitting on the side of a flat bed without using bedrails?: A  Little Help needed moving to and from a bed to a chair (including a wheelchair)?: A Little Help needed standing up from a chair using your arms (e.g., wheelchair or bedside chair)?: A Little Help needed to walk in hospital room?: A Little Help needed climbing 3-5 steps with a railing? : A Little 6 Click Score: 18    End of Session   Activity Tolerance: Treatment limited secondary to medical complications (Comment);Other (comment) (nausea limiting, defer EOB/OOB) Patient left: in bed;with call bell/phone within reach Nurse Communication: Mobility status;Other (comment) (pt requesting to speak with RN) PT Visit Diagnosis: Other abnormalities of gait and mobility (R26.89);Muscle weakness (generalized) (M62.81);Difficulty in walking, not elsewhere classified (R26.2);Pain Pain - part of body:  (abdomen/groin)     Time: 1941-7408 PT Time Calculation (min) (ACUTE ONLY): 11 min  Charges:  $Therapeutic Exercise: 8-22 mins                     Chapel Silverthorn P., PTA Acute Rehabilitation Services Pager: [phone] Office: [phone]   Angus Palms 08/14/2020, 3:06 PM

## 2020-08-14 NOTE — Progress Notes (Signed)
Pt declines her home nocturnal tube feeding 2/2 nausea unrelieved by current anti-emetic regimen. Will convey to Day Team and continue to monitor.

## 2020-08-14 NOTE — Progress Notes (Signed)
Mobility Specialist - Progress Note   08/14/20 1149  Mobility  Activity Contraindicated/medical hold   Per RN, pt still too nauseated for mobility at this time. Will f/u as able.   Mamie Levers Mobility Specialist Mobility Specialist Phone: [phone]

## 2020-08-14 NOTE — Progress Notes (Signed)
  Progress Note    08/14/2020 5:08 PM 7 Days Post-Op  Subjective: Still having nausea  Vitals:   08/14/20 1159 08/14/20 1647  BP: 137/69 128/70  Pulse: 64 67  Resp: 18 16  Temp: 98.5 F (36.9 C) 98.4 F (36.9 C)  SpO2: 96% 97%    Physical Exam: Awake alert oriented Abdomen is soft Bilateral dorsalis pedis pulses are palpable CBC    Component Value Date/Time   WBC 17.7 (H) 08/14/2020 0153   RBC 3.36 (L) 08/14/2020 0153   HGB 10.7 (L) 08/14/2020 0153   HCT 31.7 (L) 08/14/2020 0153   PLT 378 08/14/2020 0153   MCV 94.3 08/14/2020 0153   MCH 31.8 08/14/2020 0153   MCHC 33.8 08/14/2020 0153   RDW 13.2 08/14/2020 0153    BMET    Component Value Date/Time   NA 140 08/14/2020 0153   K 3.6 08/14/2020 0153   CL 102 08/14/2020 0153   CO2 28 08/14/2020 0153   GLUCOSE 120 (H) 08/14/2020 0153   BUN <5 (L) 08/14/2020 0153   CREATININE 0.66 08/14/2020 0153   CALCIUM 8.6 (L) 08/14/2020 0153   GFRNONAA >60 08/14/2020 0153   GFRAA >90 05/10/2014 1100    INR    Component Value Date/Time   INR 1.3 (H) 08/07/2020 1247     Intake/Output Summary (Last 24 hours) at 08/14/2020 1708 Last data filed at 08/14/2020 1200 Gross per 24 hour  Intake 480 ml  Output 100 ml  Net 380 ml     Assessment:  45 y.o. female is 1 week out from aortobifemoral bypass grafting with nausea.  Plan: G-tube to gravity, will continue J-tube feeds and convert antinausea medications to IV and pain meds to liquid through the J-tube.  Will avoid any pills through the J-tube given previous complications.  Out of bed as tolerated  Diet as tolerated for comfort  Disposition: Patient will need to remain inpatient until tolerating diet without significant nausea  DVT prophylaxis: Subcutaneous heparin  Brandon C. Randie Heinz, MD [facility] [place] Office: [phone] Pager: [phone]  08/14/2020 5:08 PM

## 2020-08-14 NOTE — Progress Notes (Signed)
Patient with continuous nausea again today,medicaiton given for nausea as ordered.  G tube to gravity as ordered with 100 ml output so far today. Per Dr. Randie Heinz no meds in J tube at this time. Will monitor patient. Jessica [name], Randall An RN

## 2020-08-15 LAB — GLUCOSE, CAPILLARY
Glucose-Capillary: 125 mg/dL — ABNORMAL HIGH (ref 70–99)
Glucose-Capillary: 138 mg/dL — ABNORMAL HIGH (ref 70–99)
Glucose-Capillary: 143 mg/dL — ABNORMAL HIGH (ref 70–99)
Glucose-Capillary: 84 mg/dL (ref 70–99)

## 2020-08-15 MED ORDER — KETOROLAC TROMETHAMINE 15 MG/ML IJ SOLN
15.0000 mg | Freq: Four times a day (QID) | INTRAMUSCULAR | Status: DC
  Administered 2020-08-15 – 2020-08-16 (×4): 15 mg via INTRAVENOUS
  Filled 2020-08-15 (×5): qty 1

## 2020-08-15 MED ORDER — VANCOMYCIN HCL 1250 MG/250ML IV SOLN
1250.0000 mg | Freq: Once | INTRAVENOUS | Status: AC
  Administered 2020-08-15: 1250 mg via INTRAVENOUS
  Filled 2020-08-15: qty 250

## 2020-08-15 MED ORDER — METOCLOPRAMIDE HCL 5 MG/ML IJ SOLN: 10.0000 mg | Freq: Four times a day (QID) | INTRAMUSCULAR | Status: DC | PRN

## 2020-08-15 MED ORDER — METOCLOPRAMIDE HCL 5 MG/ML IJ SOLN
10.0000 mg | Freq: Four times a day (QID) | INTRAMUSCULAR | Status: DC
  Administered 2020-08-15 – 2020-08-16 (×5): 10 mg via INTRAVENOUS
  Filled 2020-08-15 (×6): qty 2

## 2020-08-15 MED ORDER — VANCOMYCIN HCL 750 MG/150ML IV SOLN
750.0000 mg | Freq: Two times a day (BID) | INTRAVENOUS | Status: DC
  Administered 2020-08-16 – 2020-08-17 (×3): 750 mg via INTRAVENOUS
  Filled 2020-08-15 (×4): qty 150

## 2020-08-15 MED ORDER — DIPHENHYDRAMINE HCL 25 MG PO CAPS
25.0000 mg | ORAL_CAPSULE | Freq: Two times a day (BID) | ORAL | Status: DC
  Administered 2020-08-16: 25 mg via ORAL
  Filled 2020-08-15 (×3): qty 1

## 2020-08-15 MED ORDER — METOCLOPRAMIDE HCL 5 MG/ML IJ SOLN
10.0000 mg | Freq: Four times a day (QID) | INTRAMUSCULAR | Status: DC | PRN
  Administered 2020-08-15: 10 mg via INTRAVENOUS
  Filled 2020-08-15: qty 2

## 2020-08-15 MED ORDER — SCOPOLAMINE 1 MG/3DAYS TD PT72
1.0000 | MEDICATED_PATCH | TRANSDERMAL | Status: DC
  Administered 2020-08-15: 1.5 mg via TRANSDERMAL
  Filled 2020-08-15: qty 1

## 2020-08-15 MED ORDER — SODIUM CHLORIDE 0.9 % IV SOLN: INTRAVENOUS | Status: DC

## 2020-08-15 MED ORDER — DIPHENHYDRAMINE HCL 25 MG PO CAPS
25.0000 mg | ORAL_CAPSULE | Freq: Once | ORAL | Status: AC
  Administered 2020-08-15: 25 mg via ORAL
  Filled 2020-08-15: qty 1

## 2020-08-15 MED ORDER — PROMETHAZINE HCL 25 MG/ML IJ SOLN: 25.0000 mg | Freq: Four times a day (QID) | INTRAMUSCULAR | Status: DC | PRN

## 2020-08-15 NOTE — Progress Notes (Signed)
@  7 Dr. Lenell Antu, on-call for Dr. Randie Heinz, paged regarding pt's nausea and vomiting despite administration of IV Zofran and Phenergan (not presently time to administer either one). Page promptly returned and MD endorsed he would place additional orders to mitigate pt's nausea. Orders received for scope patch and PRN IV Reglan. Will follow and continue to monitor.

## 2020-08-15 NOTE — Progress Notes (Addendum)
  Progress Note    08/15/2020 7:34 AM 8 Days Post-Op  Subjective:  Says she is staying nauseated and vomiting randomly.   Tm 99.1 now afebrile HR 50's-60's NSR 110's-140's systolic 97% RA  Vitals:   08/14/20 2255 08/15/20 0235  BP: (!) 141/78 119/62  Pulse: 72   Resp: 15 19  Temp: 98.5 F (36.9 C) 98.3 F (36.8 C)  SpO2: 95% 97%    Physical Exam: Cardiac:  regular Lungs:  Non labored Incisions:  All incisions are clean and dry Extremities:  Easily palpable DP pulses bilaterally Abdomen:  Soft, NT; +flatus; +N/V; decreased BS  CBC    Component Value Date/Time   WBC 17.7 (H) 08/14/2020 0153   RBC 3.36 (L) 08/14/2020 0153   HGB 10.7 (L) 08/14/2020 0153   HCT 31.7 (L) 08/14/2020 0153   PLT 378 08/14/2020 0153   MCV 94.3 08/14/2020 0153   MCH 31.8 08/14/2020 0153   MCHC 33.8 08/14/2020 0153   RDW 13.2 08/14/2020 0153    BMET    Component Value Date/Time   NA 140 08/14/2020 0153   K 3.6 08/14/2020 0153   CL 102 08/14/2020 0153   CO2 28 08/14/2020 0153   GLUCOSE 120 (H) 08/14/2020 0153   BUN <5 (L) 08/14/2020 0153   CREATININE 0.66 08/14/2020 0153   CALCIUM 8.6 (L) 08/14/2020 0153   GFRNONAA >60 08/14/2020 0153   GFRAA >90 05/10/2014 1100    INR    Component Value Date/Time   INR 1.3 (H) 08/07/2020 1247     Intake/Output Summary (Last 24 hours) at 08/15/2020 0734 Last data filed at 08/14/2020 2100 Gross per 24 hour  Intake 360 ml  Output 200 ml  Net 160 ml     Assessment:  45 y.o. female is s/p:  aortobifemoral bypass grafting with nausea.  8 Days Post-Op  Plan: -still nauseated and some vomiting.  Doesn't want her TF due to nausea/vomiting.  Will change reglan from prn to scheduled.  Will try Toradol and hold on dilaudid and see if this gives her some relief.  Also starting IVF at 50cc/hr.  Discussed with RN.   -continue to mobilize. -she has easily palpable DP pulses bilaterally -leukocytosis-looking back, she had elevated WBC in 2015.   She is afebrile.  Renal function looks good.    Doreatha Massed, PA-C [facility] [phone] 08/15/2020 7:34 AM  I have independently interviewed and examined patient and agree with PA assessment and plan above.  As above.  Recently had x-ray which was unremarkable.  If she does not progress in the coming days will need either tube contrast study or CT scan.  Out of bed as tolerated.  Linkoln Alkire C. Randie Heinz, MD [facility] [place] Office: [phone] Pager: [phone]

## 2020-08-15 NOTE — Progress Notes (Addendum)
Physical Therapy Treatment Patient Details Name: Jessica [name] MRN: [id number] DOB: Jun 17, 1976 Today's Date: 08/15/2020    History of Present Illness 45 y.o. female previous history of right common iliac artery stenting on 2 occasions previous left femoropopliteal bypass. 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  Recently she had stopped her Plavix to have her tube exchanged. Presented with skin changes in her R lateral foot with pain and pain with walking. 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    PT Comments    Pt received in supine, agreeable to therapy session and with good participation and tolerance for mobility compared with previous session. Pt reports minimal nausea during mobility and VSS throughout on RA. Pt progressed to modI for bed mobility/transfers and up to Supervision for household distance ambulation task in room with IV pole support. Pt performed stair trial in room with Supervision/no physical assist needed. Pt continues to benefit from PT services to progress toward functional mobility goals. Continue to recommend HHPT vs OPPT depending on pt preference/transportation available, frequency updated per discussion with supervising PT based on pt good progress toward goals, will keep on 1x/week for monitoring acutely.  Follow Up Recommendations  Home health PT;Supervision - Intermittent (vs OPPT depending on transportation available)     Equipment Recommendations  Other (comment);3in1 (PT) (pt prefers rollator to RW)    Recommendations for Other Services       Precautions / Restrictions Precautions Precautions: Fall Precaution Comments: J-tube (for night feeds), G-tube to gravity drain Restrictions Weight Bearing Restrictions: No    Mobility  Bed Mobility Overal bed mobility: Modified Independent Bed Mobility: Rolling;Sidelying to Sit Rolling: Modified independent (Device/Increase time) Sidelying to sit: Modified  independent (Device/Increase time)       General bed mobility comments: pt remained EOB at end of session    Transfers Overall transfer level: Modified independent Equipment used: None Transfers: Sit to/from Stand Sit to Stand: Modified independent (Device/Increase time)         General transfer comment: no physical assist needed, able to stand with hands on knees from EOB  Ambulation/Gait Ambulation/Gait assistance: Supervision Gait Distance (Feet): 90 Feet Assistive device: IV Pole Gait Pattern/deviations: Step-through pattern;Decreased stride length     General Gait Details: pt using IV pole, no LOB, good line awareness   Stairs Stairs: Yes Stairs assistance: Supervision Stair Management: Two rails;Forwards Number of Stairs: 3 General stair comments: pt ascended/descended 7" step in room (defer out in hallway due to lingering nausea) x3 reps with BUE support of RW to simulate handrails, no LOB   Wheelchair Mobility    Modified Rankin (Stroke Patients Only)       Balance Overall balance assessment: No apparent balance deficits (not formally assessed) Sitting-balance support: Feet supported;No upper extremity supported Sitting balance-Leahy Scale: Normal     Standing balance support: Single extremity supported Standing balance-Leahy Scale: Good Standing balance comment: pt able to stand unsupported but requesting to push IV pole                            Cognition Arousal/Alertness: Awake/alert Behavior During Therapy: WFL for tasks assessed/performed Overall Cognitive Status: Within Functional Limits for tasks assessed                                        Exercises General Exercises - Lower Extremity  Long Arc Quad: AROM;Both;10 reps;Seated Hip Flexion/Marching: AROM;Both;10 reps;Seated    General Comments General comments (skin integrity, edema, etc.): VSS on RA, SPO2 98% and BP 122/70 pre mobility, no dizziness       Pertinent Vitals/Pain Pain Assessment: 0-10 Faces Pain Scale: Hurts a little bit Pain Location: surgical site/abdomen Pain Descriptors / Indicators: Operative site guarding Pain Intervention(s): Monitored during session;Repositioned    Home Living                      Prior Function            PT Goals (current goals can now be found in the care plan section) Acute Rehab PT Goals Patient Stated Goal: get back home and be more active PT Goal Formulation: With patient Time For Goal Achievement: 08/21/20 Potential to Achieve Goals: Good Progress towards PT goals: Progressing toward goals    Frequency    Min 1X/week      PT Plan Frequency needs to be updated    Co-evaluation              AM-PAC PT "6 Clicks" Mobility   Outcome Measure  Help needed turning from your back to your side while in a flat bed without using bedrails?: None Help needed moving from lying on your back to sitting on the side of a flat bed without using bedrails?: None Help needed moving to and from a bed to a chair (including a wheelchair)?: None Help needed standing up from a chair using your arms (e.g., wheelchair or bedside chair)?: None Help needed to walk in hospital room?: A Little Help needed climbing 3-5 steps with a railing? : A Little 6 Click Score: 22    End of Session   Activity Tolerance: Patient tolerated treatment well Patient left: in bed;with call bell/phone within reach (seated EOB) Nurse Communication: Mobility status PT Visit Diagnosis: Other abnormalities of gait and mobility (R26.89);Muscle weakness (generalized) (M62.81);Difficulty in walking, not elsewhere classified (R26.2);Pain Pain - part of body:  (abdomen/groin)     Time: 1610-9604 PT Time Calculation (min) (ACUTE ONLY): 10 min  Charges:  $Therapeutic Exercise: 8-22 mins                     Carly P., PTA Acute Rehabilitation Services Pager: [phone] Office: [phone]    Angus Palms 08/15/2020, 12:32 PM

## 2020-08-15 NOTE — Progress Notes (Signed)
Pharmacy Antibiotic Note  Jessica [name] is a 45 y.o. female admitted on 08/07/2020 with cellulitis.  Pharmacy has been consulted for vancomycin dosing.  Patient has allergy listed to vancomycin, so will pre-medicate with benadryl prior to each dose per MD request.  Will also slow infusion time.  Plan: Vancomycin 1250 mg IV x 1 now, followed by 750 mg IV q 12 hrs. Premedicate with benadryl 25 mg po 30 min prior to each dose. Will monitor for reaction, cultures, and clinical course.  Height: 5\' 6"  (167.6 cm) Weight: 65.8 kg (145 lb) IBW/kg (Calculated) : 59.3  Temp (24hrs), Avg:98.7 F (37.1 C), Min:98.3 F (36.8 C), Max:99.1 F (37.3 C)  Recent Labs  Lab 08/10/20 1329 08/11/20 0151 08/12/20 0053 08/13/20 0124 08/14/20 0153  WBC 18.9* 17.0* 14.8* 16.8* 17.7*  CREATININE 0.63 0.67 0.56 0.54 0.66    Estimated Creatinine Clearance: 82.3 mL/min (by C-G formula based on SCr of 0.66 mg/dL).    Allergies  Allergen Reactions  . Erythromycin Anaphylaxis, Hives and Nausea Only  . Levofloxacin Other (See Comments)    Burning during administration IV prior to surgery.Pt was told not to take it again.  . Niaspan [Niacin] Anaphylaxis  . Penicillins Anaphylaxis    Has patient had a PCN reaction causing immediate rash, facial/tongue/throat swelling, SOB or lightheadedness with hypotension: Yes Has patient had a PCN reaction causing severe rash involving mucus membranes or skin necrosis: No Has patient had a PCN reaction that required hospitalization: No Has patient had a PCN reaction occurring within the last 10 years: No If all of the above answers are "NO", then may proceed with Cephalosporin use.   . Shellfish Allergy Anaphylaxis  . Sulfa Antibiotics Anaphylaxis    "Blisters in my throat"  . Wellbutrin [Bupropion] Other (See Comments)    Suicidal thoughts while on Lexapro  . Morphine Other (See Comments)    ineffective  . Strawberry Extract Hives  . Sulfamethoxazole Other (See  Comments)    Blister in throat, sores in mouth  . Tape Other (See Comments)    Nicoderm Adhesive Patch cause blisters   . Cefdinir     Unknown reaction   . Clindamycin/Lincomycin Other (See Comments)    Caused C-Diff  . Doxycycline Hives and Nausea Only    A 100mg  tablet formulation caused hives, nausea, vomiting. Can take 50 mg  . Keflex [Cephalexin] Other (See Comments)    Hallucinations  . Statins Other (See Comments)    Muscle pain  . Vancomycin Hives and Nausea Only  . Zetia [Ezetimibe] Diarrhea  . 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  08/16/20, , BCCP Clinical Pharmacist  08/15/2020 4:29 PM   [facility] pharmacy phone numbers are listed on [email or web address]

## 2020-08-15 NOTE — Care Management Important Message (Signed)
Important Message  Patient Details  Name: Jessica [name] MRN: [id number] Date of Birth: May 29, 1976   Medicare Important Message Given:  Yes     Renie Ora 08/15/2020, 12:29 PM

## 2020-08-15 NOTE — TOC Initial Note (Signed)
Transition of Care (TOC) - Initial/Assessment Note  Jessica Pierini RN, BSN Transitions of Care Unit 4E- RN Case Manager See Treatment Team for direct phone #    Patient Details  Name: Jessica [name] MRN: [id number] Date of Birth: 1976-01-03  Transition of Care [facility] [name]) CM/SW Contact:    Jessica Span, RN Phone Number: 08/15/2020, 4:26 PM  Clinical Narrative:                 Pt admitted s/p aortobifemoral bypass, from home with spouse, noted orders placed for Jessica [name] and DME needs.  CM in to speak with pt at bedside, discussed HH and DME needs- per pt she has BSC and RW at home- requesting rollator for home- agreeable to use [facility] in house provider for DME needs, list provided for [facility] choice Per CMS guidelines from medicare.gov website with star ratings (copy placed in shadow chart)- per pt she wants to use [facility] for Jessica [name] services. Address, phone # and PCP all confirmed with pt in epic.  Pt states she is hopeful for d/c over the weekend,but states MD may not release until Monday.   Call made to [facility] line for rollator request- they do not have any in stock here and will drop ship to patients home.   Call made to Jessica [name] with Jessica [name]- referral pending insurance verification- per Jessica [name] if they are in network they will accept referral if not in-network Jessica [name] will refer out to another Agency in [place] that would be in-network for ptElnita [name] with [facility] to f/u.     Expected Discharge Plan: Home w Home Health Services Barriers to Discharge: Continued Medical Work up   Patient Goals and CMS Choice Patient states their goals for this hospitalization and ongoing recovery are:: return home and feel better CMS Medicare.gov Compare Post Acute Care list provided to:: Patient Choice offered to / list presented to : Patient  Expected Discharge Plan and Services Expected Discharge Plan: Home w Home Health Services   Discharge Planning Services: CM Consult Post Acute Care Choice:  Durable Medical Equipment,Home Health Living arrangements for the past 2 months: Single Family Home                 DME Arranged: 3-N-1,Walker rolling with seat DME Agency: [facility] Date DME Agency Contacted: 08/15/20 Time DME Agency Contacted: [phone] Representative spoke with at DME Agency: Jessica [name] HH Arranged: PT HH Agency: Jessica [name] Home Health Services Date [name] Agency Contacted: 08/15/20 Time HH Agency Contacted: 1619 Representative spoke with at [facility] Agency: Jessica [name]  Prior Living Arrangements/Services Living arrangements for the past 2 months: Single Family Home Lives with:: Self,Spouse Patient language and need for interpreter reviewed:: Yes Do you feel safe going back to the place where you live?: Yes      Need for Family Participation in Patient Care: Yes (Comment) Care giver support system in place?: Yes (comment) Current home services: DME (BSCand RW) Criminal Activity/Legal Involvement Pertinent to Current Situation/Hospitalization: No - Comment as needed  Activities of Daily Living Home Assistive Devices/Equipment: Eyeglasses,Insulin Pump,CBG Meter,Crutches ADL Screening (condition at time of admission) Patient's cognitive ability adequate to safely complete daily activities?: Yes Is the patient deaf or have difficulty hearing?: No Does the patient have difficulty seeing, even when wearing glasses/contacts?: No Does the patient have difficulty concentrating, remembering, or making decisions?: No Patient able to express need for assistance with ADLs?: Yes Does the patient have difficulty dressing or bathing?: No Independently performs ADLs?: Yes (appropriate for developmental age) Does  the patient have difficulty walking or climbing stairs?: Yes Weakness of Legs: Right Weakness of Arms/Hands: None  Permission Sought/Granted Permission sought to share information with : Oceanographer granted to share information with : Yes, Verbal  Permission Granted     Permission granted to share info w AGENCY: HH/DME        Emotional Assessment Appearance:: Appears stated age Attitude/Demeanor/Rapport: Engaged Affect (typically observed): Appropriate,Pleasant Orientation: : Oriented to Self,Oriented to Place,Oriented to  Time,Oriented to Situation Alcohol / Substance Use: Not Applicable Psych Involvement: No (comment)  Admission diagnosis:  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 Aortoiliac occlusive disease (HCC) 08/07/2020  . Attention deficit hyperactivity disorder (ADHD) 06/27/2019  . MDD (major depressive disorder), single episode, in full remission (HCC) 06/27/2019  . Pancreatic mass 04/05/2017  . Postoperative pain 04/05/2017  . Controlled substance agreement signed 03/15/2017  . Fibrocystic breast 09/20/2016  . Insomnia 09/20/2016  . Osteopenia 09/20/2016  . PVD (peripheral vascular disease) (HCC) 07/30/2016  . Insulin pump in place 05/21/2015  . Diarrhea 08/08/2014  . History of pancreatectomy 04/23/2014  . 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 Hashimoto's thyroiditis 11/15/2013  . 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 Type 1 diabetes (HCC) 11/01/2012  . Vitamin D insufficiency 10/16/2012  . Statin intolerance 10/16/2012  . Dyslipidemia 07/28/2011  . Depression 04/07/2004  . Tobacco abuse 05/09/2003   PCP:  Jessica Diamond, MD Pharmacy:   [facility] [phone] - [place], [place] - [address] AT [place] [name] [place] [phone] [place] [address] Phone: [phone] Fax: [phone]     Social Determinants of Health (SDOH) Interventions    Readmission Risk Interventions No flowsheet data found.

## 2020-08-15 NOTE — Progress Notes (Signed)
Mobility Specialist - Progress Note   08/15/20 1419  Mobility  Activity Ambulated in hall  Level of Assistance Standby assist, set-up cues, supervision of patient - no hands on  Assistive Device None  Distance Ambulated (ft) 550 ft  Mobility Response Tolerated well  Mobility performed by Mobility specialist  $Mobility charge 1 Mobility   Pt asx throughout ambulation. Pt to bed after walk. HR remained in 80s throughout.   Mamie Levers Mobility Specialist Mobility Specialist Phone: [phone]

## 2020-08-15 NOTE — Progress Notes (Signed)
OT Cancellation Note  Patient Details Name: Jessica [name] MRN: [id number] DOB: [birth year]   Cancelled Treatment:    Reason Eval/Treat Not Completed: OT screened, no needs identified, will sign off. In to see patient and she reports she is getting herself up to bathroom and doing her own bathing and dressing its just the N/V that is her issue. No further OT needs, we will sign off.  Ignacia Palma, OTR/L Acute Rehab Services Pager [phone] Office [phone]     Evette Georges 08/15/2020, 12:18 PM

## 2020-08-16 LAB — BASIC METABOLIC PANEL
Anion gap: 10 (ref 5–15)
BUN: 7 mg/dL (ref 6–20)
CO2: 26 mmol/L (ref 22–32)
Calcium: 8.5 mg/dL — ABNORMAL LOW (ref 8.9–10.3)
Chloride: 100 mmol/L (ref 98–111)
Creatinine, Ser: 0.77 mg/dL (ref 0.44–1.00)
GFR, Estimated: >60 mL/min (ref >60)
Glucose, Bld: 126 mg/dL — ABNORMAL HIGH (ref 70–99)
Potassium: 3.6 mmol/L (ref 3.5–5.1)
Sodium: 136 mmol/L (ref 135–145)

## 2020-08-16 LAB — GLUCOSE, CAPILLARY
Glucose-Capillary: 101 mg/dL — ABNORMAL HIGH (ref 70–99)
Glucose-Capillary: 107 mg/dL — ABNORMAL HIGH (ref 70–99)
Glucose-Capillary: 212 mg/dL — ABNORMAL HIGH (ref 70–99)
Glucose-Capillary: 199 mg/dL — ABNORMAL HIGH (ref 70–99)

## 2020-08-16 LAB — CBC
HCT: 29.8 % — ABNORMAL LOW (ref 36.0–46.0)
Hemoglobin: 10.4 g/dL — ABNORMAL LOW (ref 12.0–15.0)
MCH: 32.5 pg (ref 26.0–34.0)
MCHC: 34.9 g/dL (ref 30.0–36.0)
MCV: 93.1 fL (ref 80.0–100.0)
Platelets: 433 10*3/uL — ABNORMAL HIGH (ref 150–400)
RBC: 3.2 MIL/uL — ABNORMAL LOW (ref 3.87–5.11)
RDW: 13.1 % (ref 11.5–15.5)
WBC: 16.8 10*3/uL — ABNORMAL HIGH (ref 4.0–10.5)
nRBC: 0 % (ref 0.0–0.2)

## 2020-08-16 MED ORDER — OXYCODONE HCL 5 MG PO TABS
5.0000 mg | ORAL_TABLET | ORAL | Status: DC | PRN
  Administered 2020-08-16 – 2020-08-17 (×4): 5 mg via ORAL
  Filled 2020-08-16 (×4): qty 1

## 2020-08-16 MED ORDER — OXYCODONE HCL 5 MG PO TABS
5.0000 mg | ORAL_TABLET | Freq: Three times a day (TID) | ORAL | Status: DC | PRN
  Administered 2020-08-16: 5 mg via ORAL
  Filled 2020-08-16: qty 1

## 2020-08-16 MED ORDER — METHOCARBAMOL 500 MG PO TABS
500.0000 mg | ORAL_TABLET | Freq: Three times a day (TID) | ORAL | Status: DC
  Administered 2020-08-16: 500 mg via ORAL
  Filled 2020-08-16: qty 1

## 2020-08-16 MED ORDER — METOCLOPRAMIDE HCL 5 MG/ML IJ SOLN: 10.0000 mg | Freq: Four times a day (QID) | INTRAMUSCULAR | Status: DC | PRN

## 2020-08-16 NOTE — Progress Notes (Signed)
Mobility Specialist: Progress Note   08/16/20 1138  Mobility  Activity Ambulated in hall  Level of Assistance Independent  Assistive Device None  Distance Ambulated (ft) 470 ft  Mobility Response Tolerated well  Mobility performed by Mobility specialist  $Mobility charge 1 Mobility   Pre-Mobility: 79 HR Post-Mobility: 69 HR, 141/87 BP, 98% SpO2  Pt c/o bilateral "ache" in her groin during ambulation but said she was fine to walk. Pt otherwise asx during ambulation. Pt sitting EOB after walk with call bell in reach.   [facility] Jessica [name] Mobility Specialist Mobility Specialist Phone: [phone]

## 2020-08-16 NOTE — TOC Transition Note (Signed)
Transition of Care [facility]) - CM/SW Discharge Note   Patient Details  Name: Jessica [name] MRN: [id number] Date of Birth: [birth year]  Transition of Care [facility]) CM/SW Contact:  Glennon Mac, RN Phone Number: 08/16/2020, 10:02 AM   Clinical Narrative:   Pt medically stable for discharge home today. [facility] to follow up with pt at home for HHPT; rollator to be delivered to patient's home.  Pt declines need for 3 in 1, and states has RW at home to use until rollator arrives.     Final next level of care: Home w Home Health Services Barriers to Discharge: Barriers Resolved   Patient Goals and CMS Choice Patient states their goals for this hospitalization and ongoing recovery are:: return home and feel better CMS Medicare.gov Compare Post Acute Care list provided to:: Patient Choice offered to / list presented to : Patient                        Discharge Plan and Services   Discharge Planning Services: CM Consult Post Acute Care Choice: Durable Medical Equipment,Home Health          DME Arranged: 3-N-1,Walker rolling with seat DME Agency: [facility] Date DME Agency Contacted: 08/15/20 Time DME Agency Contacted: [phone] Representative spoke with at DME Agency: sheila HH Arranged: PT HH Agency: [facility] Date [facility] Agency Contacted: 08/15/20 Time HH Agency Contacted: 1619 Representative spoke with at [facility] Agency: cheryl rose  Social Determinants of Health (SDOH) Interventions     Readmission Risk Interventions No flowsheet data found. Quintella Baton, RN, BSN  Trauma/Neuro ICU Case Manager [phone]

## 2020-08-16 NOTE — Progress Notes (Addendum)
  Progress Note    08/16/2020 9:28 AM 9 Days Post-Op  Subjective:  Says her nausea is better and wants to go home.  Had IV dilaudid this morning at 5am.  Afebrile HR 50's-70's  120's-130's systolic 98% RA  Vitals:   08/16/20 0522 08/16/20 0854  BP: 122/75 126/69  Pulse: 70   Resp: 11 20  Temp: 98.5 F (36.9 C) 98.1 F (36.7 C)  SpO2: 97% 98%    Physical Exam: Cardiac:  regular Lungs:  Non labored Incisions:  Unchanged from yesterday afternoon.  Still with mild erythema around umbilicus.  No drainage.  She does have small hematoma bilateral groins that is unchanged.   Extremities:  Easily palpable DP pulses bilaterally Abdomen:  Soft; +BM (liquid as it was pre op)  CBC    Component Value Date/Time   WBC 16.8 (H) 08/16/2020 0209   RBC 3.20 (L) 08/16/2020 0209   HGB 10.4 (L) 08/16/2020 0209   HCT 29.8 (L) 08/16/2020 0209   PLT 433 (H) 08/16/2020 0209   MCV 93.1 08/16/2020 0209   MCH 32.5 08/16/2020 0209   MCHC 34.9 08/16/2020 0209   RDW 13.1 08/16/2020 0209    BMET    Component Value Date/Time   NA 136 08/16/2020 0209   K 3.6 08/16/2020 0209   CL 100 08/16/2020 0209   CO2 26 08/16/2020 0209   GLUCOSE 126 (H) 08/16/2020 0209   BUN 7 08/16/2020 0209   CREATININE 0.77 08/16/2020 0209   CALCIUM 8.5 (L) 08/16/2020 0209   GFRNONAA >60 08/16/2020 0209   GFRAA >90 05/10/2014 1100    INR    Component Value Date/Time   INR 1.3 (H) 08/07/2020 1247     Intake/Output Summary (Last 24 hours) at 08/16/2020 6578 Last data filed at 08/16/2020 0300 Gross per 24 hour  Intake 971.61 ml  Output --  Net 971.61 ml     Assessment:  45 y.o. female is s/p:  aortobifemoral bypass grafting with nausea  9 Days Post-Op  Plan: -pre op lifestyle limiting claudication resolved.  Keep right foot ulcer dry.  It is improving. -pt N/V resolved.  She did have TF last evening.  She capped her GJ tube this morning. -still having pain in both groins.  No evidence of infection.   She does have some mild erythema around the umbilicus and minimal erythema bilateral groins.  Will continue her vanc with benadryl for now. Discussed with her that if she can tolerate no IV pain medication today and continues to not have any nausea today, we will discharge her home tomorrow.  Will restart her home dose of pain medication.   Not getting relief from Toradol so will discontinue this.  -DVT prophylaxis:  Sq heparin -chronic leukocytosis (hx of splenectomy)  -changed her reglan back to prn, discontinued gabapentin order since she has not been taking this as well as robaxin.  Her BP is well controlled and HR in 50's so her 2.5mg  IV lopressor discontinued.   Will make sure she tolerates discontinuing these today for discharge tomorrow.   Doreatha Massed, PA-C [facility] [phone] 08/16/2020 9:28 AM

## 2020-08-16 NOTE — Discharge Summary (Signed)
Open Aortic Surgery Discharge Summary    Jessica [name] 05/13/1976 45 y.o. female  [id number]  Admission Date: 08/07/2020  Discharge Date: 08/18/2020  Physician: No att. providers found  Admission Diagnosis: PAOD (peripheral arterial occlusive disease) ([place]) [I77.9] Aortoiliac occlusive disease ([place]) [I74.09]   HPI:   This is a 45 y.o. female who presents for evaluation of new ulceration of right foot.  She is well-known to [facility] with history of right common iliac artery stenting on 2 occasions by Dr. Donzetta Matters.  She also previously had a left femoral popliteal bypass performed in [place].  Bypass required an intervention in the remote past also by Dr. Donzetta Matters.  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  She was last seen in the office by Dr. Donzetta Matters in December of last year.  The patient had discolored toes however had a normal ABI and TBI with a 2+ palpable DP pulse.  She returns office today having seen her podiatrist to be seen for a new lateral foot necrotic ulceration which is causing her 10 out of 10 pain consistently.  She fears that she will require amputation of her leg if she does not receive treatment.  She is also an insulin-dependent diabetic with an A1c of 8.  She states that her insulin has recently been adjusted however still maintains a glucose of around 200 due to the nutritional requirements through tube feedings.  She quit smoking in November of last year.  Hospital Course:  The patient was admitted to the hospital and taken to the operating room on 08/07/2020 and underwent: 1.  Exploratory laparotomy with lysis of adhesions for 30 minutes 2.  Redo exposure left common femoral artery greater than 30 days 3.  Aorto bifemoral bypass graft with 14 x 7 mm Dacron and 16 mm cuff placed around aortic anastomosis    Findings: The aorta was heavily calcified.  An end-to-end anastomosis was placed.  The  inferior mesenteric artery was occluded.  The common femoral arteries were soft.  At completion there were palpable dorsalis pedis arteries bilaterally  The pt tolerated the procedure well and was transported to the PACU in good condition.   On afternoon of surgery, she had palpable pulses bilaterally.  DM coordinator was consulted to help manage her insulin pump/insulin gtt.  Her G-tube was placed to gravity, she was on a PCA for pain control.    POD 1, Feet are well-perfused.  Having incisional pain today.  We will keep G-tube to gravity she can have ice chips and sips of clears for comfort.  We will plan to start J-tube feeds likely this weekend.  Foley and A-line out.  Possible transfer to 4 E. later today.  POD 2, she was transferred to the progressive unit.    POD 3, she was ok for sips and clears.  G tube remained to gravity awaiting bowel function return.    POD 4, pt had BM.  She was transitioned back to her insulin pump and ok to start TF and diet as tolerated.  Her PCA had to be restarted for pain control.    POD 5, continues to have palpable pulses.  Tolerating TF and regular diet.  POD 6, pt had xray of right foot and this did not show any signs of osteomyelitis.  Weaning PCA.     POD 7, continued having nausea-anti emetics changed to IV.  OOB as tolerated, diet for comfort.    POD 8,  still with nausea.  reglan was changed from prn to scheduled.  toradol was given to wean IV pain medication and IVF started at 50cc/hr.  She continues to have leukocytosis and this is chronic. She remains afebrile and normal renal function.  POD 9, her nausea has improved.  Her claudication is resolved.  She has capped her GJ-tube.  still having pain in both groins.  No evidence of infection.  She does have some mild erythema around the umbilicus and minimal erythema bilateral groins.  Will continue her vanc with benadryl for now. Discussed with her that if she can tolerate no IV pain medication today  and continues to not have any nausea today, we will discharge her home tomorrow.  Will restart her home dose of pain medication.   Not getting relief from Toradol so will discontinue this.   POD 10, she was feeling much better. Nausea resolved.  She continued to have some redness around her midline incision.  She only required dilaudid twice overnight.  Long discussion about pain medication.  See below.   She continued to have hematoma in the left groin.  This was unchanged and no evidence of infection.    Discussed with pt and she does not want the Robaxin or Gabapentin to go home with.  She will take her Oxycodone q6h prn today and call her pain management MD tomorrow morning to adjust pain medication.    She is on Vancomycin for erythema around midline incision.  She states she is able to take doxycycline 11m tablets (does not tolerate the coating of 101mtablets).  Will send her with 5 full days of this to total 7 days of abx.  She is in agreement that the only rx being sent to pharmacy is Doxycycline.    CBC    Component Value Date/Time   WBC 16.8 (H) 08/16/2020 0209   RBC 3.20 (L) 08/16/2020 0209   HGB 10.4 (L) 08/16/2020 0209   HCT 29.8 (L) 08/16/2020 0209   PLT 433 (H) 08/16/2020 0209   MCV 93.1 08/16/2020 0209   MCH 32.5 08/16/2020 0209   MCHC 34.9 08/16/2020 0209   RDW 13.1 08/16/2020 0209    BMET    Component Value Date/Time   NA 136 08/16/2020 0209   K 3.6 08/16/2020 0209   CL 100 08/16/2020 0209   CO2 26 08/16/2020 0209   GLUCOSE 126 (H) 08/16/2020 0209   BUN 7 08/16/2020 0209   CREATININE 0.77 08/16/2020 0209   CALCIUM 8.5 (L) 08/16/2020 0209   GFRNONAA >60 08/16/2020 0209   GFRAA >90 05/10/2014 1100     Discharge Instructions    Discharge patient   Complete by: As directed    Discharge disposition: 01-Home or Self Care   Discharge patient date: 08/17/2020      Discharge Diagnosis:  PAOD (peripheral arterial occlusive disease) ([place][I77.9] Aortoiliac  occlusive disease ([place][I74.09]  Secondary Diagnosis: Patient Active Problem List   Diagnosis Date Noted  . PAOD (peripheral arterial occlusive disease) (HCMerced02/17/2022  . Aortoiliac occlusive disease (HCMesic02/17/2022  . Attention deficit hyperactivity disorder (ADHD) 06/27/2019  . MDD (major depressive disorder), single episode, in full remission ([facility]01/11/2019  . Pancreatic mass 04/05/2017  . Postoperative pain 04/05/2017  . Controlled substance agreement signed 03/15/2017  . Fibrocystic breast 09/20/2016  . Insomnia 09/20/2016  . Osteopenia 09/20/2016  . PVD (peripheral vascular disease) (HCOdessa02/02/2017  . Insulin pump in place 05/21/2015  . Diarrhea 08/08/2014  . History of  pancreatectomy 04/23/2014  . 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 Hashimoto's thyroiditis 11/15/2013  . 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 Type 1 diabetes ([place]) 11/01/2012  . Vitamin D insufficiency 10/16/2012  . Statin intolerance 10/16/2012  . Dyslipidemia 07/28/2011  . Depression 04/07/2004  . Tobacco abuse 05/09/2003   Past Medical History:  Diagnosis Date  . ADHD (attention deficit hyperactivity disorder)   . Anxiety   . Diabetes mellitus type I ([place])   . Diverticulosis of colon without hemorrhage   . DM (diabetes mellitus) ([place])    post pancreatectomy  . Feeding by G-tube ([place])   . GERD (gastroesophageal reflux disease)   . Hashimoto's thyroiditis    euthyroid  . Hypercholesterolemia   . PONV (postoperative nausea and vomiting)      Allergies as of 08/17/2020      Reactions   Erythromycin Anaphylaxis, Hives, Nausea Only   Levofloxacin Other (See Comments)   Burning during administration IV prior to surgery.Pt was told not to take it again.   Niaspan [niacin] Anaphylaxis   Penicillins Anaphylaxis   Has patient had a PCN reaction causing immediate rash, facial/tongue/throat swelling, SOB or lightheadedness with hypotension: Yes Has patient had a PCN  reaction causing severe rash involving mucus membranes or skin necrosis: No Has patient had a PCN reaction that required hospitalization: No Has patient had a PCN reaction occurring within the last 10 years: No If all of the above answers are "NO", then may proceed with Cephalosporin use.   Shellfish Allergy Anaphylaxis   Sulfa Antibiotics Anaphylaxis   "Blisters in my throat"   Wellbutrin [bupropion] Other (See Comments)   Suicidal thoughts while on Lexapro   Morphine Other (See Comments)   ineffective   Strawberry Extract Hives   Sulfamethoxazole Other (See Comments)   Blister in throat, sores in mouth   Tape Other (See Comments)   Nicoderm Adhesive Patch cause blisters   Cefdinir    Unknown reaction    Clindamycin/lincomycin Other (See Comments)   Caused C-Diff   Doxycycline Hives, Nausea Only   A 18m tablet formulation caused hives, nausea, vomiting. 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   aspirin EC 81 MG tablet Take 81 mg by mouth daily.   clopidogrel 75 MG tablet Commonly known as: Plavix Take 1 tablet (75 mg total) by mouth daily.   doxycycline 50 MG capsule Commonly known as: VIBRAMYCIN Take 2 capsules (100 mg total) by mouth 2 (two) times daily for 5 days.   EpiPen 2-Pak 0.3 mg/0.3 mL Soaj injection Generic drug: EPINEPHrine Inject 0.3 mg into the skin daily as needed for anaphylaxis (allergic reaction).   Evolocumab 140 MG/ML Sosy Inject 140 mg into the skin every 14 (fourteen) days. Repatha   insulin lispro 100 UNIT/ML injection Commonly known as: HUMALOG continuous. INSULIN  PUMP   levothyroxine 75 MCG tablet Commonly known as: SYNTHROID Take 75 mcg by mouth every Sunday.   levothyroxine 50 MCG tablet Commonly known as: SYNTHROID Take 50 mcg by mouth See admin instructions. Take very day except :  Sunday   lisdexamfetamine 20 MG capsule Commonly known as: VYVANSE Take 1 capsule (20 mg total) by mouth daily.   multivitamin with minerals Tabs tablet Take 2 tablets by mouth daily. DEKA   Narcan 4 MG/0.1ML Liqd nasal spray kit Generic drug: naloxone Place 1 spray into the nose once as needed for opioid reversal.   oxyCODONE 5 MG immediate release tablet Commonly known as: Oxy IR/ROXICODONE Take 5 mg by mouth 3 (three) times daily as needed for severe pain or moderate pain.   pantoprazole 40 MG tablet Commonly known as: PROTONIX Take 80 mg by mouth at bedtime.   Pertzye [id number] units Cpep Generic drug: Pancrelipase (Lip-Prot-Amyl) Take 1 tablet by mouth 3 (three) times daily with meals. Only when eat or snack   Precision QID Test test strip Generic drug: glucose blood 1 strip by external route three times daily One touch verio   OneTouch Verio test strip Generic drug: glucose blood 3 (three) times daily.   senna 8.6 MG Tabs tablet Commonly known as: SENOKOT Take 1 tablet by mouth daily as needed for mild constipation.   Vitamin D (Ergocalciferol) 1.25 MG (50000 UNIT) Caps capsule Commonly known as: DRISDOL Take 50,000 Units by mouth 3 (three) times a week.   Vivonex RTF Liqd Take 87 mLs by mouth at bedtime. Additional at lunch       Instructions:  [facility] Discharge Instructions   Open Aortic Surgery  Please refer to the following instructions for your post-procedure care. Your surgeon or Physician Assistant will discuss any changes with you.  Activity  Avoid lifting more than eight pounds (a gallon of milk) until after your first post-operative visit. You are encouraged to walk as much as  you can. You can slowly return to normal activities but must avoid strenuous activity and heavy lifting until your doctor tells you it's okay. Heavy lifting can hurt the incision and cause a hernia. Avoid activities such as vacuuming or swinging a golf club. It is normal to feel tired for several weeks after your surgery. Do not drive until your doctor gives the okay and you are no longer taking prescription pain medications. It is also normal to have difficulty with sleep habits, eating and bowl movements after surgery. These will go away with time.  Bathing/Showering  Shower daily after you go home. Do not soak in a bathtub, hot tub, or swim until the incision heals.  Incision Care  Shower every day. Clean your incision with mild soap and water. Pat the area dry with a clean towel. You do not need a bandage unless otherwise instructed. Do not apply any ointments or creams to your incision. You may have skin glue on your incision. Do not peel it off. It will come off on its own in about one week. If you have staples or sutures along your incision, they will be removed at your post op appointment.  If you have groin incisions, wash the groin wounds with soap and water daily and pat dry. (No tub bath-only shower)  Then put a dry gauze or washcloth in the groin to keep this area dry to help prevent wound infection.  Do this daily and as needed.  Do not use Vaseline or neosporin on your incisions.  Only use soap and water on your incisions and then protect and keep dry.  Diet  Resume your normal diet. There are no special food restriction following this procedure. A low fat/low cholesterol diet is  recommended for all patients with vascular disease. After your aortic surgery, it's normal to feel full faster than usual and to not feel as hungry as you normally would. You will probably lose weight initially following your surgery. It's best to eat small, frequent meals over the course of the day. Call the  office if you find that you are unable to eat even small meals.   In order to heal from your surgery, it is CRITICAL to get adequate nutrition. Your body requires vitamins, minerals, and protein. Vegetables are the best source of vitamins and minerals.  If you have pain, you may take over-the-counter pain reliever such as acetaminophen (Tylenol). If you were prescribed a stronger pain medication, please be aware these medication can cause nausea and constipation. Prevent nausea by taking the medication with a snack or meal. Avoid constipation by drinking plenty of fluids and eating foods with a high amount of fiber, such as fruits, vegetables and grains. Take 158m of the over-the-counter stool softener Colace twice a day as needed to help with constipation. A laxative, such as Milk of Magnesia, may be recommended for you at this time. Do not take a laxative unless your surgeon or P.A. tells you it's OK.  Do not take Tylenol if you are taking stronger pain medications with Tylenol (such as Percocet).  Follow Up  Our office will schedule a follow up appointment 2-3 weeks after discharge.  Please call u[place]oreaimmediately for any of the following conditions    .     Severe or worsening pain in your legs or feet or in your abdomen back or chest. . Increased pain, redness drainage (pus) from your incision site. . Increased abdominal pain, bloating, nausea, vomiting, or persistent diarrhea. . Fever of 101 degrees or higher. . Swelling in your leg (s). .  Reduce your risk of vascular disease  . Stop smoking. If you would like help, call [facility] at 1-800-QUIT-NOW (980-325-8069 or [place] 3337-505-4053 . Manage your cholesterol . Maintain a desired weight . Control your diabetes . Keep your blood pressure down .  If you have any questions please call the office at [phone]   Prescriptions given: 1.  Doxycycline 581mtwo tablets bid x 5 days #20 no refill. No pain medication given  due to pain contract.   Disposition: home  Patient's condition: is Good  Follow up: 1. Dr. CaDonzetta Mattersn 2 weeks   SaLeontine LocketPA-C [facility] [phone]/28/2022  9:33 AM   - For VQI Registry use -  Post-op:  Time to Extubation: [x]  In OR, [ ]  < 12 hrs, [ ]  12-24 hrs, [ ]  >=24 hrs Vasopressors Req. Post-op: No ICU Stay: 3 day in ICU Transfusion: No   If yes, n/a units given MI: No, [ ]  Troponin only, [ ]  EKG or Clinical New Arrhythmia: No  Complications: CHF: No Resp failure: No, [ ]  Pneumonia, [ ]  Ventilator Chg in renal function: No, [ ]  Inc. Cr > 0.5, [ ]  Temp. Dialysis, [ ]  Permanent dialysis Leg ischemia: No, no Surgery needed, [ ]  Yes, Surgery needed, [ ]  Amputation Bowel ischemia: No, [ ]  Medical Rx, [ ]  Surgical Rx Wound complication: No, [ ]  Superficial separation/infection, [ ]  Return to OR Return to OR: No  Return to OR for bleeding: No Stroke: No, [ ]  Minor, [ ]  Major  Discharge medications: Statin use:  No  ASA use:  Yes   Plavix use:  Yes  Beta  blocker use:  No  ACEI use:  No ARB use:  No CCB use:  No Coumadin use:  No

## 2020-08-17 LAB — GLUCOSE, CAPILLARY
Glucose-Capillary: 135 mg/dL — ABNORMAL HIGH (ref 70–99)
Glucose-Capillary: 131 mg/dL — ABNORMAL HIGH (ref 70–99)
Glucose-Capillary: 200 mg/dL — ABNORMAL HIGH (ref 70–99)

## 2020-08-17 MED ORDER — METHOCARBAMOL 500 MG PO TABS: 500.0000 mg | ORAL_TABLET | Freq: Three times a day (TID) | ORAL | Status: DC | PRN

## 2020-08-17 MED ORDER — DOXYCYCLINE HYCLATE 50 MG PO CAPS: 100.0000 mg | ORAL_CAPSULE | Freq: Two times a day (BID) | ORAL | 0 refills | Status: AC

## 2020-08-17 NOTE — Progress Notes (Addendum)
  Progress Note    08/17/2020 8:10 AM 10 Days Post-Op  Subjective:  Nausea continues to be resolved.  She is having BM/flatus.  She continues to have some pain in her groins.  She is begging to go home.   Afebrile HR 60's-80's NSR 120's-140's sytolic 97% RA  Vitals:   08/16/20 2309 08/17/20 0326  BP: 140/61 125/68  Pulse: 77 77  Resp: 16 16  Temp: 98.4 F (36.9 C) 98.2 F (36.8 C)  SpO2: 99% 97%    Physical Exam: Cardiac:  regular Lungs:  Non labored Incisions:  Midline incision still with some redness, otherwise healing; bilateral groins healing-left groin with unchanged hematoma Extremities:  Easily palpable DP pulses bilaterally Abdomen:  Soft, NT; +BM; +flatus  CBC    Component Value Date/Time   WBC 16.8 (H) 08/16/2020 0209   RBC 3.20 (L) 08/16/2020 0209   HGB 10.4 (L) 08/16/2020 0209   HCT 29.8 (L) 08/16/2020 0209   PLT 433 (H) 08/16/2020 0209   MCV 93.1 08/16/2020 0209   MCH 32.5 08/16/2020 0209   MCHC 34.9 08/16/2020 0209   RDW 13.1 08/16/2020 0209    BMET    Component Value Date/Time   NA 136 08/16/2020 0209   K 3.6 08/16/2020 0209   CL 100 08/16/2020 0209   CO2 26 08/16/2020 0209   GLUCOSE 126 (H) 08/16/2020 0209   BUN 7 08/16/2020 0209   CREATININE 0.77 08/16/2020 0209   CALCIUM 8.5 (L) 08/16/2020 0209   GFRNONAA >60 08/16/2020 0209   GFRAA >90 05/10/2014 1100    INR    Component Value Date/Time   INR 1.3 (H) 08/07/2020 1247     Intake/Output Summary (Last 24 hours) at 08/17/2020 0810 Last data filed at 08/17/2020 0334 Gross per 24 hour  Intake 1320.89 ml  Output --  Net 1320.89 ml     Assessment:  45 y.o. female is s/p:  aortobifemoral bypass grafting  10 Days Post-Op  Plan: -claudication resolved.  Right foot ulcer improving. Continue to keep dry and discussed this with pt. -post op N/V resolved.  Her G-J tube has been capped for 24 hrs.  -she continues to have pain the groins.  She was on Oxy 5mg  tid prior to admission.   We tried to get her back to this schedule yesterday, however, she did have to take IV dilaudid last night at 7pm and again at 2am.   Her oxy was increased to q4h yesterday.  She has not taken this every 4 hrs and has been 5 hrs and 6 hrs b/w doses.  Robaxin was added back to her her regimen yesterday but she only took one dose and said she did not get relief.  She states that she can get in touch with her pain management MD tomorrow.   Will d/w Dr. -hematoma left groin-discussed she can use warm compresses on this area.  -DVT prophylaxis:  Sq heparin -if d/c home, she can take the Doxycycline 50mg  tablets without difficulty given erythema on incision.  She has remained afebrile.  While she is here, will continue vanc.    Darrick Penna, PA-C [facility] [phone] 08/17/2020 8:10 AM  Nausea better Incisions improved complete 7 d antibiotic course Pain management at home  D/c home  [phone], MD [facility] [place] Office: [phone]

## 2020-08-17 NOTE — Discharge Instructions (Signed)
[facility]  Discharge Instructions   Open Aortic Surgery  Please refer to the following instructions for your post-procedure care. Your surgeon or Physician Assistant will discuss any changes with you.  Activity  Avoid lifting more than eight pounds (a gallon of milk) until after your first post-operative visit. You are encouraged to walk as much as you can. You can slowly return to normal activities but must avoid strenuous activity and heavy lifting until your doctor tells you it's okay. Heavy lifting can hurt the incision and cause a hernia. Avoid activities such as vacuuming or swinging a golf club. It is normal to feel tired for several weeks after your surgery. Do not drive until your doctor gives the okay and you are no longer taking prescription pain medications. It is also normal to have difficulty with sleep habits, eating and bowl movements after surgery. These will go away with time.  Bathing/Showering  Shower daily after you go home. Do not soak in a bathtub, hot tub, or swim until the incision heals.  Incision Care  Shower every day. Clean your incision with mild soap and water. Pat the area dry with a clean towel. You do not need a bandage unless otherwise instructed. Do not apply any ointments or creams to your incision. You may have skin glue on your incision. Do not peel it off. It will come off on its own in about one week. If you have staples or sutures along your incision, they will be removed at your post op appointment.  If you have groin incisions, wash the groin wounds with soap and water daily and pat dry. (No tub bath-only shower)  Then put a dry gauze or washcloth in the groin to keep this area dry to help prevent wound infection.  Do this daily and as needed.  Do not use Vaseline or neosporin on your incisions.  Only use soap and water on your incisions and then protect and keep dry.  Diet  Resume your normal diet. There are no  special food restriction following this procedure. A low fat/low cholesterol diet is recommended for all patients with vascular disease. After your aortic surgery, it's normal to feel full faster than usual and to not feel as hungry as you normally would. You will probably lose weight initially following your surgery. It's best to eat small, frequent meals over the course of the day. Call the office if you find that you are unable to eat even small meals.   In order to heal from your surgery, it is CRITICAL to get adequate nutrition. Your body requires vitamins, minerals, and protein. Vegetables are the best source of vitamins and minerals. If you have pain, you may take over-the-counter pain reliever such as acetaminophen (Tylenol). If you were prescribed a stronger pain medication, please be aware these medication can cause nausea and constipation. Prevent nausea by taking the medication with a snack or meal. Avoid constipation by drinking plenty of fluids and eating foods with a high amount of fiber, such as fruits, vegetables and grains. Take 100mg  of the over-the-counter stool softener Colace twice a day as needed to help with constipation. A laxative, such as Milk of Magnesia, may be recommended for you at this time. Do not take a laxative unless your surgeon or P.A. tells you it's OK.   Follow Up  Our office will schedule a follow up appointment 2-3 weeks after discharge.  Please call immediately for any of the  following conditions    .     Severe or worsening pain in your legs or feet or in your abdomen back or chest. . Increased pain, redness drainage (pus) from your incision site. . Increased abdominal pain, bloating, nausea, vomiting, or persistent diarrhea. . Fever of 101 degrees or higher. . Swelling in your leg (s). .  Reduce your risk of vascular disease  . Stop smoking. If you would like help, call [facility] at 1-800-QUIT-NOW ((613)521-6784) or [place] at  (516)619-3064. . Manage your cholesterol . Maintain a desired weight . Control your diabetes . Keep your blood pressure down .  If you have any questions please call the office at [phone].

## 2020-08-17 NOTE — Progress Notes (Signed)
Mobility Specialist: Progress Note   08/17/20 1125  Mobility  Activity Refused mobility   Pt refused mobility stating she will be discharging soon and wants to save her energy for when she gets home.   [facility] Brylie Sneath Mobility Specialist Mobility Specialist Phone: [phone]

## 2020-08-17 NOTE — Progress Notes (Signed)
Pt discharged to home with husband.  Pt taken off telemetry and CCMD notified.  Pt's IV removed.  Pt left with all of their personal belongings. AVS documentation reviewed and sent home with Pt and all questions were  answered.

## 2020-08-20 NOTE — Progress Notes (Signed)
Virtual Visit via Video Note  I connected with Jessica [name] on 09/02/20 at  9:40 AM EDT by a video enabled telemedicine application and verified that I am speaking with the correct person using two identifiers.  Location: Patient: home Provider: office Persons participated in the visit- patient, provider   I discussed the limitations of evaluation and management by telemedicine and the availability of in person appointments. The patient expressed understanding and agreed to proceed.   I discussed the assessment and treatment plan with the patient. The patient was provided an opportunity to ask questions and all were answered. The patient agreed with the plan and demonstrated an understanding of the instructions.   The patient was advised to call back or seek an in-person evaluation if the symptoms worsen or if the condition fails to improve as anticipated.  I provided 15 minutes of non-face-to-face time during this encounter.   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  She states that she had a leg surgery.  She has been adjusting well, although she is struggling with pain initially.  She spends time sitting and reading.  Her husband has been very supportive.  She is hoping to take a walk soon.  She has not taken Vyvanse after having tried only for a week due to this surgery.  She struggles with attention.  She jumps subject, and has had difficulty in completing tasks.  She has started to take Vyvanse again since yesterday.  She has not noticed any side effects so far.  She starts to have middle insomnia.  She would like to be back on Xanax, although she has not taken it for a while.  She agrees to try it after  starting Vyvanse.  She reports good relationship with her mother, who now says "I love you" whenever they have a phone call.  Her mother is struggling with diabetes with other medical condition.  She denies any depressive symptoms including SI.     Daily routine:household chores, watching TV,reading,walk to mail box, calls her mother every other day at least Marital status:married Number of children:0   Visit Diagnosis:    ICD-10-CM   1. Attention deficit hyperactivity disorder (ADHD), unspecified ADHD type  F90.9   2. Insomnia, unspecified type  G47.00     Past Psychiatric History: Please see initial evaluation for full details. I have reviewed the history. No updates at this time.     Past Medical History:  Past Medical History:  Diagnosis Date  . ADHD (attention deficit hyperactivity disorder)   . Anxiety   . Diabetes mellitus type I ([place])   . Diverticulosis of colon without hemorrhage   . DM (diabetes mellitus) ([place])    post pancreatectomy  . Feeding by G-tube ([place])   . GERD (gastroesophageal reflux disease)   . Hashimoto's thyroiditis    euthyroid  . Hypercholesterolemia   . PONV (postoperative nausea and vomiting)     Past Surgical History:  Procedure Laterality Date  . ABDOMINAL AORTOGRAM W/LOWER EXTREMITY N/A 04/27/2017   Procedure: ABDOMINAL AORTOGRAM W/LOWER EXTREMITY;  Surgeon: Waynetta Sandy, MD;  Location: [place] [facility];  Service: Cardiovascular;  Laterality: N/A;  .  ABDOMINAL AORTOGRAM W/LOWER EXTREMITY Bilateral 06/04/2019   Procedure: ABDOMINAL AORTOGRAM W/LOWER EXTREMITY;  Surgeon: Waynetta Sandy, MD;  Location: [place] [facility];  Service: Cardiovascular;  Laterality: Bilateral;  . ABDOMINAL AORTOGRAM W/LOWER EXTREMITY N/A 11/26/2019   Procedure: ABDOMINAL AORTOGRAM W/ Right LOWER EXTREMITY [place];  Surgeon: Waynetta Sandy, MD;  Location: [place] [facility];  Service: Cardiovascular;  Laterality: N/A;  . ABDOMINAL  AORTOGRAM W/LOWER EXTREMITY N/A 07/28/2020   Procedure: ABDOMINAL AORTOGRAM W/LOWER EXTREMITY;  Surgeon: Waynetta Sandy, MD;  Location: [place] [facility];  Service: Cardiovascular;  Laterality: N/A;  . ABDOMINAL HYSTERECTOMY    . AORTA - BILATERAL FEMORAL ARTERY BYPASS GRAFT Bilateral 08/07/2020   Procedure: AORTA BIFEMORAL BYPASS GRAFTING;  Surgeon: Waynetta Sandy, MD;  Location: [place];  Service: Vascular;  Laterality: Bilateral;  . BIOPSY N/A 05/15/2014   Procedure: GASTIC,  ASCENDING, AND DESCEDNING/SIGMOID  COLON BIOPSY;  Surgeon: Daneil Dolin, MD;  Location: [facility];  Service: Endoscopy;  Laterality: N/A;  . CHOLECYSTECTOMY    . COLONOSCOPY WITH PROPOFOL N/A 05/15/2014   Dr. Gala Romney (primary GI is Dr. Oneida Alar). Hemorrhoids, negative random colon biopsies. GI pathogen panel negative. Diverticulosis.  [name] complete hysterectomy    . ESOPHAGOGASTRODUODENOSCOPY (EGD) WITH PROPOFOL N/A 05/15/2014   Dr. Gala Romney (primary GI Dr. Oneida Alar), Billroth II, inflamed residual gastric mucosa, benign biopsies  . FEMORAL-POPLITEAL BYPASS GRAFT    . 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  [name] KNEE SURGERY Right    X 6-5 arthroscopies and open procedure 1 time- tighten patellar tendon and loose IT band  . 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  [name] PERIPHERAL VASCULAR INTERVENTION Right 06/04/2019   Procedure: PERIPHERAL VASCULAR INTERVENTION;  Surgeon: Waynetta Sandy, MD;  Location: [place] [facility];  Service: Cardiovascular;  Laterality: Right;  common iliac  . PERIPHERAL VASCULAR INTERVENTION Right 11/26/2019   Procedure: PERIPHERAL VASCULAR INTERVENTION;  Surgeon: Waynetta Sandy, MD;  Location: [place] [facility];  Service: Cardiovascular;  Laterality: Right;  Iliac     Family Psychiatric History: Please see initial evaluation for full details. I have reviewed the history. No updates at this time.     Family History:   Family History  Problem Relation Age of Onset  . Pancreatic cancer Paternal Grandfather   . Cirrhosis Paternal Grandfather   . Heart attack Paternal Grandfather   . Pancreatitis Other        cousin  . Throat cancer Mother   . Peripheral Artery Disease Mother   . Depression Father   . Pancreatitis Father   . Colon cancer Neg Hx     Social History:  Social History   Socioeconomic History  . Marital status: Married    Spouse name: Not on file  . Number of children: 0  . Years of education: Not on file  . Highest education level: Not on file  Occupational History  . Occupation: disabled  Tobacco Use  . Smoking status: Former Smoker    Packs/day: 2.00    Years: 30.00    Pack years: 60.00    Types: Cigarettes  . Smokeless tobacco: Never Used  . Tobacco comment: 1 cig daily  Vaping Use  . Vaping Use: Some days  Substance and Sexual Activity  . Alcohol use: No    Alcohol/week: 0.0 standard drinks  . Drug use: No  . Sexual activity: Yes    Birth control/protection: None  Other Topics Concern  . Not  on file  Social History Narrative  . Not on file   Social Determinants of Health   Financial Resource Strain: Not on file  Food Insecurity: Not on file  Transportation Needs: Not on file  Physical Activity: Not on file  Stress: Not on file  Social Connections: Not on file    Allergies:  Allergies  Allergen Reactions  . Erythromycin Anaphylaxis, Hives and Nausea Only  . Levofloxacin Other (See Comments)    Burning during administration IV prior to surgery.Pt was told not to take it again.  . Niaspan [Niacin] Anaphylaxis  . Penicillins Anaphylaxis    Has patient had a PCN reaction causing immediate rash, facial/tongue/throat swelling, SOB or lightheadedness with hypotension: Yes Has patient had a PCN reaction causing severe rash involving mucus membranes or skin necrosis: No Has patient had a PCN reaction that required hospitalization: No Has patient had a PCN  reaction occurring within the last 10 years: No If all of the above answers are "NO", then may proceed with Cephalosporin use.   . Shellfish Allergy Anaphylaxis  . Sulfa Antibiotics Anaphylaxis    "Blisters in my throat"  . Wellbutrin [Bupropion] Other (See Comments)    Suicidal thoughts while on Lexapro  . Morphine Other (See Comments)    ineffective  . Strawberry Extract Hives  . Sulfamethoxazole Other (See Comments)    Blister in throat, sores in mouth  . Tape Other (See Comments)    Nicoderm Adhesive Patch cause blisters   . Cefdinir     Unknown reaction   . Clindamycin/Lincomycin Other (See Comments)    Caused C-Diff  . Doxycycline Hives and Nausea Only    A 183m tablet formulation caused hives, nausea, vomiting. Can take 50 mg  . Keflex [Cephalexin] Other (See Comments)    Hallucinations  . Statins Other (See Comments)    Muscle pain  . Vancomycin Hives and Nausea Only  . Zetia [Ezetimibe] Diarrhea  . 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 ALPRAZolam (XANAX) 0.5 MG tablet Take 1 tablet (0.5 mg total) by mouth at bedtime as needed for sleep. 30 tablet 1  . [START ON 09/23/2020] lisdexamfetamine (VYVANSE) 20 MG capsule Take 1 capsule (20 mg total) by mouth daily. 30 capsule 0  . aspirin EC 81 MG tablet Take 81 mg by mouth daily.    . clopidogrel (PLAVIX) 75 MG tablet Take 1 tablet (75 mg  total) by mouth daily. 90 tablet 3  . EPIPEN 2-PAK 0.3 MG/0.3ML SOAJ injection Inject 0.3 mg into the skin daily as needed for anaphylaxis (allergic reaction).    . Evolocumab 140 MG/ML SOSY Inject 140 mg into the skin every 14 (fourteen) days. Repatha    . glucose blood (PRECISION QID TEST) test strip 1 strip by external route three times daily One touch verio    . insulin lispro (HUMALOG) 100 UNIT/ML injection continuous. INSULIN PUMP    .  levothyroxine (SYNTHROID) 50 MCG tablet Take 50 mcg by mouth See admin instructions. Take very day except : Sunday    . levothyroxine (SYNTHROID) 75 MCG tablet Take 75 mcg by mouth every Sunday.    . lisdexamfetamine (VYVANSE) 20 MG capsule Take 1 capsule (20 mg total) by mouth daily. (Patient not taking: Reported on 07/30/2020) 30 capsule 0  . Multiple Vitamin (MULTIVITAMIN WITH MINERALS) TABS tablet Take 2 tablets by mouth daily. DEKA    . Multiple Vitamins-Minerals (ADULT ONE DAILY GUMMIES) CHEW Chew 2 tablets by mouth daily.    [name] NARCAN 4 MG/0.1ML LIQD nasal spray kit Place 1 spray into the nose once as needed for opioid reversal.    . Nutritional Supplements (VIVONEX RTF) LIQD Take 87 mLs by mouth at bedtime. Additional at lunch    . ONETOUCH VERIO test strip 3 (three) times daily.    [name] oxyCODONE (OXY IR/ROXICODONE) 5 MG immediate release tablet Take 5 mg by mouth 3 (three) times daily as needed for severe pain or moderate pain.    [name] oxyCODONE (OXY IR/ROXICODONE) 5 MG immediate release tablet Take 1 tablet (5 mg total) by mouth every 8 (eight) hours as needed for severe pain. 30 tablet 0  . Pancrelipase, Lip-Prot-Amyl, (PERTZYE) 55732-20254 units CPEP Take 1 tablet by mouth 3 (three) times daily with meals. Only when eat or snack    . pantoprazole (PROTONIX) 40 MG tablet Take 80 mg by mouth at bedtime.     . promethazine (PHENERGAN) 12.5 MG tablet Take 1 tablet (12.5 mg total) by mouth every 6 (six) hours as needed for nausea or vomiting. 30 tablet 0  .  senna (SENOKOT) 8.6 MG TABS tablet Take 1 tablet by mouth daily as needed for mild constipation.    . Vitamin D, Ergocalciferol, (DRISDOL) 1.25 MG (50000 UNIT) CAPS capsule Take 50,000 Units by mouth 3 (three) times a week.      No current facility-administered medications for this visit.     Musculoskeletal: Strength & Muscle Tone: N/A Gait & Station: N/A Patient leans: N/A  Psychiatric Specialty Exam: Review of Systems  Psychiatric/Behavioral: Positive for decreased concentration and sleep disturbance. Negative for agitation, behavioral problems, confusion, dysphoric mood, hallucinations, self-injury and suicidal ideas. The patient is not nervous/anxious and is not hyperactive.   All other systems reviewed and are negative.   There were no vitals taken for this visit.There is no height or weight on file to calculate BMI.  General Appearance: Fairly Groomed  Eye Contact:  Good  Speech:  Clear and Coherent  Volume:  Normal  Mood:  good  Affect:  Appropriate, Congruent and euthymic  Thought Process:  Coherent  Orientation:  Full (Time, Place, and Person)  Thought Content: Logical   Suicidal Thoughts:  No  Homicidal Thoughts:  No  Memory:  Immediate;   Good  Judgement:  Good  Insight:  Good  Psychomotor Activity:  Normal  Concentration:  Concentration: Good and Attention Span: Good  Recall:  Good  Fund of Knowledge: Good  Language: Good  Akathisia:  No  Handed:  Right  AIMS (if indicated): not done  Assets:  Communication Skills Desire for Improvement  ADL's:  Intact  Cognition: WNL  Sleep:  Poor   Screenings: PHQ2-9   Flowsheet Row Video Visit from 09/02/2020 in [place]  PHQ-2 Total Score 0    Flowsheet Row Video Visit from 09/02/2020 in [place] Admission (Discharged) from 08/07/2020 in [facility] SURGICAL PROGRESSIVE  CARE Pre-Admission Testing 60 from 08/05/2020 in [facility]  PREADMISSION TESTING  C-SSRS RISK CATEGORY No Risk No Risk No Risk       Assessment and Plan:  Jessica [name] is a 45 y.o. year old female with a history of  ADHD, depression,hashimoto's thyroiditis,AIH/Hereditary pancreatitiss/p islet cell transplant,diabetes,s/p Roux-en- Y bypass surgery,left lower extremity bypass with a subsequent balloon angioplasty, who presents for follow up appointment for below.   1. Attention deficit hyperactivity disorder (ADHD), unspecified ADHD type She continues to struggle with symptoms of ADHD in the context of non adherence to Vyvanse due to recent surgery.  Noted that this medication is started from lower dose given she had adverse reaction of palpitation from higher dose of Concerta.  We will restart this medication to target ADHD.  She is aware of its potential risks, which includes but not limited to palpitation, worsening in and anxiety, insomnia and appetite loss.   2. Insomnia, unspecified type Although she was able to taper off Xanax, she has started to struggle with insomnia again.  She agrees to try this medication if insomnia persists despite starting Vyvanse.  Discussed potential risk of oversedation and respiratory suppression with concomitant use of opioid.  Noted that she has had no benefits from other hypnotics despite multiple of trials.   Plan 1. Continue Vyvanse 20 mg daily  2. Continue Xanax 0.5 mg at night as needed for sleep.  3.Next appointment: 4/19 at 8:80fr20 mins, video   Past trials of medication:sertraline, fluoxetine,Paxil, citalopram, lexapro, bupropion (voices), venlafaxine, duloxetine, trintellix, mirtazapine, trazodone, amitriptyline,doxepin (insomnia),lamotrigine,Geodon, olanzapine,quetiapine,Vraylar, xanax, clonazepam,temazepam,ritalin, trazodone, Ambien, Rozerem,Lunesta, Belsomra, benadryl,Concerta (palpitation at 36 mg)  The patient demonstrates the following risk factors for suicide: Chronic risk  factors for suicide include:psychiatric disorder ofdepressionand medical illness pancreatitis. Acute risk factorsfor suicide include: unemployment. Protective factorsfor this patient include: positive social support, coping skills and hope for the future. Considering these factors, the overall suicide risk at this point appears to below. Patientisappropriate for outpatient follow up.   RNorman Clay MD 09/02/2020, 10:11 AM

## 2020-08-21 ENCOUNTER — Other Ambulatory Visit: Payer: Self-pay

## 2020-08-21 NOTE — Progress Notes (Signed)
Patient called to ask if lethargy is normal after aortabifem. Advised her yes - could last up to several months. Patient verbalizes understanding.

## 2020-08-22 ENCOUNTER — Other Ambulatory Visit (HOSPITAL_COMMUNITY): Payer: Medicare (Managed Care)

## 2020-08-25 ENCOUNTER — Encounter: Payer: Self-pay | Admitting: Vascular Surgery

## 2020-08-25 ENCOUNTER — Other Ambulatory Visit: Payer: Self-pay

## 2020-08-25 ENCOUNTER — Ambulatory Visit (INDEPENDENT_AMBULATORY_CARE_PROVIDER_SITE_OTHER): Payer: Medicare (Managed Care) | Admitting: Vascular Surgery

## 2020-08-25 VITALS — BP 113/80 | HR 105 | Temp 98.2°F | Resp 20 | Ht 66.0 in | Wt 137.0 lb

## 2020-08-25 DIAGNOSIS — I779 Disorder of arteries and arterioles, unspecified: Secondary | ICD-10-CM

## 2020-08-25 MED ORDER — PROMETHAZINE HCL 12.5 MG PO TABS: 12.5000 mg | ORAL_TABLET | Freq: Four times a day (QID) | ORAL | 0 refills | Status: AC | PRN

## 2020-08-25 MED ORDER — OXYCODONE HCL 5 MG PO TABS: 5.0000 mg | ORAL_TABLET | Freq: Three times a day (TID) | ORAL | 0 refills | Status: DC | PRN

## 2020-08-25 NOTE — Progress Notes (Signed)
    Subjective:     Patient ID: Jessica [name], female   DOB: 1976/04/12, 45 y.o.   MRN: [id number]  HPI 45 year old female follows up after aortobifemoral bypass graft.  Her feet do feel very warm her wound appears stable.  She has had some redness around her incision she thinks is secondary to Dermabond.  She has not had any fevers or chills.  She has had some difficulty eating however she is taking pills and these are staying down.  She has had 2 increase the time and when she takes her tube feeds but overall she is having bowel function and remaining hydrated.  She does have significant pain throughout the day she was not prescribed pain medicine upon discharge due to her ongoing pain contract.   Review of Systems Pain, nausea, vomiting    Objective:   Physical Exam Vitals:   08/25/20 1028  BP: 113/80  Pulse: (!) 105  Resp: 20  Temp: 98.2 F (36.8 C)  SpO2: 97%  Awake alert oriented All incisions are well-healing do have some blotchy erythema around all of them that is not warm to the touch appears to be secondary to glue irritation Palpable dorsalis pedis pulses bilaterally Right lateral foot wound with stable eschar     Assessment:     45 year old female status post aortobifemoral bypass grafting for right foot wound    Plan:     Foot wound is stable okay for evaluation by podiatry  I will prescribe 30 oxycodone pills today to be taken in conjunction with her pain regimen.  Phenergan sent to the pharmacy for ongoing nausea vomiting help tolerate tube feeds  Follow-up 1 month with ABIs.     Brandon C. Randie Heinz, MD [facility] Office: [phone] Pager: [phone]

## 2020-08-27 ENCOUNTER — Other Ambulatory Visit: Payer: Self-pay

## 2020-08-27 DIAGNOSIS — I779 Disorder of arteries and arterioles, unspecified: Secondary | ICD-10-CM

## 2020-08-29 ENCOUNTER — Encounter (HOSPITAL_COMMUNITY): Payer: Medicare (Managed Care)

## 2020-08-29 ENCOUNTER — Encounter: Payer: Medicare (Managed Care) | Admitting: Vascular Surgery

## 2020-09-02 ENCOUNTER — Telehealth (INDEPENDENT_AMBULATORY_CARE_PROVIDER_SITE_OTHER): Payer: Medicare (Managed Care) | Admitting: Psychiatry

## 2020-09-02 ENCOUNTER — Encounter: Payer: Self-pay | Admitting: Psychiatry

## 2020-09-02 ENCOUNTER — Other Ambulatory Visit: Payer: Self-pay

## 2020-09-02 DIAGNOSIS — F909 Attention-deficit hyperactivity disorder, unspecified type: Secondary | ICD-10-CM

## 2020-09-02 DIAGNOSIS — G4700 Insomnia, unspecified: Secondary | ICD-10-CM

## 2020-09-02 MED ORDER — LISDEXAMFETAMINE DIMESYLATE 20 MG PO CAPS: 20.0000 mg | ORAL_CAPSULE | Freq: Every day | ORAL | 0 refills | Status: DC

## 2020-09-02 MED ORDER — ALPRAZOLAM 0.5 MG PO TABS: 0.5000 mg | ORAL_TABLET | Freq: Every evening | ORAL | 1 refills | Status: AC | PRN

## 2020-09-02 NOTE — Patient Instructions (Addendum)
1. Continue vyvanse 20 mg daily  2. Continue Xanax 0.5 mg at night as needed for sleep.  3.Next appointment: 4/19 at 8:20

## 2020-09-26 ENCOUNTER — Other Ambulatory Visit: Payer: Self-pay

## 2020-09-26 ENCOUNTER — Ambulatory Visit (HOSPITAL_COMMUNITY)
Admission: RE | Admit: 2020-09-26 | Discharge: 2020-09-26 | Disposition: A | Discharge status: Home / Self Care | Payer: Medicare (Managed Care) | Source: Ambulatory Visit | Attending: Vascular Surgery | Admitting: Vascular Surgery

## 2020-09-26 ENCOUNTER — Ambulatory Visit (INDEPENDENT_AMBULATORY_CARE_PROVIDER_SITE_OTHER): Payer: Medicare (Managed Care) | Admitting: Vascular Surgery

## 2020-09-26 ENCOUNTER — Encounter: Payer: Self-pay | Admitting: Vascular Surgery

## 2020-09-26 VITALS — BP 145/88 | HR 79 | Temp 98.0°F | Resp 20 | Ht 66.0 in | Wt 133.0 lb

## 2020-09-26 DIAGNOSIS — I739 Peripheral vascular disease, unspecified: Secondary | ICD-10-CM

## 2020-09-26 DIAGNOSIS — I779 Disorder of arteries and arterioles, unspecified: Secondary | ICD-10-CM

## 2020-09-26 NOTE — Progress Notes (Signed)
    Subjective:     Patient ID: Jessica [name], female   DOB: 07/05/1975, 45 y.o.   MRN: [id number]  HPI 45 year old female follows up after aortobifemoral bypass.  She is doing very well.  She has some discomfort in her bilateral groins.  She is healing her right lateral foot wound.  She is probably lost about 15 pounds   Review of Systems Discomfort bilateral groins Decreased appetite Healing right lateral foot wound    Objective:   Physical Exam Vitals:   09/26/20 0953  BP: (!) 145/88  Pulse: 79  Resp: 20  Temp: 98 F (36.7 C)  SpO2: 96%   Awake alert oriented Nonlabored respirations Well-healed midline and bilateral groin incisions 2+ palpable dorsalis pedis pulses Right lateral foot wound with scab that is improving and decreasing in size   ABI Summary:  Right: Resting right ankle-brachial index is within normal range. No  evidence of significant right lower extremity arterial disease. The right  toe-brachial index is normal. RT great toe pressure = 157 mmHg.   Left: Resting left ankle-brachial index is within normal range. No  evidence of significant left lower extremity arterial disease. The left  toe-brachial index is normal. LT Great toe pressure = 157 mmHg.     Assessment:     45 year old female status post aortobifemoral bypass.  She has a previous left femoropopliteal bypass which is patent and she has a well-healing right lateral foot wound.    Plan:     F/u in 6 months with ABI    Santino Kinsella C. Randie Heinz, MD [facility] Office: [phone] Pager: [phone]

## 2020-10-01 ENCOUNTER — Other Ambulatory Visit: Payer: Self-pay

## 2020-10-01 DIAGNOSIS — I739 Peripheral vascular disease, unspecified: Secondary | ICD-10-CM

## 2020-10-01 NOTE — Progress Notes (Signed)
Virtual Visit via Video Note  I connected with Jessica [name] on 10/07/20 at  8:20 AM EDT by a video enabled telemedicine application and verified that I am speaking with the correct person using two identifiers.  Location: Patient: home Provider: office   I discussed the limitations of evaluation and management by telemedicine and the availability of in person appointments. The patient expressed understanding and agreed to proceed.    I discussed the assessment and treatment plan with the patient. The patient was provided an opportunity to ask questions and all were answered. The patient agreed with the plan and demonstrated an understanding of the instructions.   The patient was advised to call back or seek an in-person evaluation if the symptoms worsen or if the condition fails to improve as anticipated.  I provided 15 minutes of non-face-to-face time during this encounter.   Jessica Clay, MD    [facility] MD/PA/NP OP Progress Note  10/07/2020 8:48 AM Jessica [name]  MRN:  [id number]  Chief Complaint:  Chief Complaint    ADHD; Other; Follow-up     HPI:  This is a follow-up appointment for ADHD and insomnia.  She states that she has been having migraine and nausea since being started on Vyvanse, although it works good for her concentration.  She has been sleeping better since starting Xanax; she sleeps 6 hours total, and has middle insomnia.  Although she does not feel refreshed in the morning, it has been getting better.  She enjoys taking a walk with her sister.  She wishes to have better endurance as before.  She is concerned about her mother, who has end-stage lung cancer.  She has been trying to work through it, using techniques she learned through therapy.  Although she occasionally feels depressed, she wants to utilize techniques first before starting medication.  She reports great relationship with her mother.  She denies any change in weight or appetite.  She denies SI.    Marital status:married Number of children:0   Visit Diagnosis:    ICD-10-CM   1. Attention deficit hyperactivity disorder (ADHD), unspecified ADHD type  F90.9   2. Insomnia, unspecified type  G47.00     Past Psychiatric History: Please see initial evaluation for full details. I have reviewed the history. No updates at this time.     Past Medical History:  Past Medical History:  Diagnosis Date  . ADHD (attention deficit hyperactivity disorder)   . Anxiety   . Diabetes mellitus type I ([place])   . Diverticulosis of colon without hemorrhage   . DM (diabetes mellitus) ([place])    post pancreatectomy  . Feeding by G-tube ([place])   . GERD (gastroesophageal reflux disease)   . Hashimoto's thyroiditis    euthyroid  . Hypercholesterolemia   . PONV (postoperative nausea and vomiting)     Past Surgical History:  Procedure Laterality Date  . ABDOMINAL AORTOGRAM W/LOWER EXTREMITY N/A 04/27/2017   Procedure: ABDOMINAL AORTOGRAM W/LOWER EXTREMITY;  Surgeon: Waynetta Sandy, MD;  Location: [place] [facility];  Service: Cardiovascular;  Laterality: N/A;  . ABDOMINAL AORTOGRAM W/LOWER EXTREMITY Bilateral 06/04/2019   Procedure: ABDOMINAL AORTOGRAM W/LOWER EXTREMITY;  Surgeon: Waynetta Sandy, MD;  Location: [place] [facility];  Service: Cardiovascular;  Laterality: Bilateral;  . ABDOMINAL AORTOGRAM W/LOWER EXTREMITY N/A 11/26/2019   Procedure: ABDOMINAL AORTOGRAM W/ Right LOWER EXTREMITY [place];  Surgeon: Waynetta Sandy, MD;  Location: [place] [facility];  Service: Cardiovascular;  Laterality: N/A;  . ABDOMINAL AORTOGRAM  W/LOWER EXTREMITY N/A 07/28/2020   Procedure: ABDOMINAL AORTOGRAM W/LOWER EXTREMITY;  Surgeon: Waynetta Sandy, MD;  Location: [place] [facility];  Service: Cardiovascular;  Laterality: N/A;  . ABDOMINAL HYSTERECTOMY    . AORTA - BILATERAL FEMORAL ARTERY BYPASS GRAFT Bilateral 08/07/2020   Procedure: AORTA BIFEMORAL BYPASS GRAFTING;  Surgeon:  Waynetta Sandy, MD;  Location: [place];  Service: Vascular;  Laterality: Bilateral;  . BIOPSY N/A 05/15/2014   Procedure: GASTIC,  ASCENDING, AND DESCEDNING/SIGMOID  COLON BIOPSY;  Surgeon: Daneil Dolin, MD;  Location: [facility];  Service: Endoscopy;  Laterality: N/A;  . CHOLECYSTECTOMY    . COLONOSCOPY WITH PROPOFOL N/A 05/15/2014   Dr. Gala Romney (primary GI is Dr. Oneida Alar). Hemorrhoids, negative random colon biopsies. GI pathogen panel negative. Diverticulosis.  [name] complete hysterectomy    . ESOPHAGOGASTRODUODENOSCOPY (EGD) WITH PROPOFOL N/A 05/15/2014   Dr. Gala Romney (primary GI Dr. Oneida Alar), Billroth II, inflamed residual gastric mucosa, benign biopsies  . FEMORAL-POPLITEAL BYPASS GRAFT    . 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  [name] KNEE SURGERY Right    X 6-5 arthroscopies and open procedure 1 time- tighten patellar tendon and loose IT band  . 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  [name] PERIPHERAL VASCULAR INTERVENTION Right 06/04/2019   Procedure: PERIPHERAL VASCULAR INTERVENTION;  Surgeon: Waynetta Sandy, MD;  Location: [place] [facility];  Service: Cardiovascular;  Laterality: Right;  common iliac  . PERIPHERAL VASCULAR INTERVENTION Right 11/26/2019   Procedure: PERIPHERAL VASCULAR INTERVENTION;  Surgeon: Waynetta Sandy, MD;  Location: [place] [facility];  Service: Cardiovascular;  Laterality: Right;  Iliac     Family Psychiatric History: Please see initial evaluation for full details. I have reviewed the history. No updates at this time.     Family History:  Family History  Problem Relation Age of Onset  . Pancreatic cancer Paternal Grandfather   . Cirrhosis Paternal Grandfather   . Heart attack Paternal Grandfather   . Pancreatitis Other        cousin  . Throat cancer Mother   . Peripheral Artery Disease Mother   . Depression Father   . Pancreatitis Father   . Colon cancer Neg Hx     Social History:   Social History   Socioeconomic History  . Marital status: Married    Spouse name: Not on file  . Number of children: 0  . Years of education: Not on file  . Highest education level: Not on file  Occupational History  . Occupation: disabled  Tobacco Use  . Smoking status: Former Smoker    Packs/day: 2.00    Years: 30.00    Pack years: 60.00    Types: Cigarettes  . Smokeless tobacco: Never Used  . Tobacco comment: 1 cig daily  Vaping Use  . Vaping Use: Some days  Substance and Sexual Activity  . Alcohol use: No    Alcohol/week: 0.0 standard drinks  . Drug use: No  . Sexual activity: Yes    Birth control/protection: None  Other Topics Concern  . Not on file  Social History Narrative  . Not on file   Social Determinants of Health   Financial Resource Strain: Not on file  Food Insecurity: Not on file  Transportation Needs: Not on file  Physical Activity: Not on file  Stress: Not on file  Social Connections: Not on file    Allergies:  Allergies  Allergen Reactions  . Erythromycin Anaphylaxis, Hives and Nausea Only  .  Levofloxacin Other (See Comments)    Burning during administration IV prior to surgery.Pt was told not to take it again.  . Niaspan [Niacin] Anaphylaxis  . Other Anaphylaxis and Other (See Comments)    Derma Bond severe rash  . Penicillins Anaphylaxis    Has patient had a PCN reaction causing immediate rash, facial/tongue/throat swelling, SOB or lightheadedness with hypotension: Yes Has patient had a PCN reaction causing severe rash involving mucus membranes or skin necrosis: No Has patient had a PCN reaction that required hospitalization: No Has patient had a PCN reaction occurring within the last 10 years: No If all of the above answers are "NO", then may proceed with Cephalosporin use.   . Shellfish Allergy Anaphylaxis  . Sulfa Antibiotics Anaphylaxis    "Blisters in my throat"  . Wellbutrin [Bupropion] Other (See Comments)    Suicidal  thoughts while on Lexapro  . Morphine Other (See Comments)    ineffective  . Strawberry Extract Hives  . Sulfamethoxazole Other (See Comments)    Blister in throat, sores in mouth  . Tape Other (See Comments)    Nicoderm Adhesive Patch cause blisters   . Cefdinir     Unknown reaction   . Clindamycin/Lincomycin Other (See Comments)    Caused C-Diff  . Doxycycline Hives and Nausea Only    A 133m tablet formulation caused hives, nausea, vomiting. Can take 50 mg  . Keflex [Cephalexin] Other (See Comments)    Hallucinations  . Statins Other (See Comments)    Muscle pain  . Vancomycin Hives and Nausea Only  . Zetia [Ezetimibe] Diarrhea  . 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 [START ON 11/02/2020] ALPRAZolam (XANAX) 0.5 MG tablet Take 1 tablet (0.5 mg total) by mouth at bedtime as needed for anxiety. 30 tablet 2  . lisdexamfetamine (VYVANSE) 10 MG capsule Take 1 capsule (10 mg total) by mouth daily. 30 capsule 0  . ALPRAZolam (XANAX) 0.5 MG tablet Take 1 tablet (0.5 mg total) by mouth at bedtime as needed for sleep. 30 tablet 1  . aspirin EC 81 MG tablet Take 81 mg by mouth daily.    . clopidogrel (PLAVIX) 75 MG tablet Take 1 tablet (75 mg total) by mouth daily. 90 tablet 3  . EPIPEN 2-PAK 0.3 MG/0.3ML SOAJ injection Inject 0.3 mg into the skin daily as needed for anaphylaxis (allergic reaction).    .  Evolocumab 140 MG/ML SOSY Inject 140 mg into the skin every 14 (fourteen) days. Repatha    . glucose blood (PRECISION QID TEST) test strip 1 strip by external route three times daily One touch verio    . insulin lispro (HUMALOG) 100 UNIT/ML injection continuous. INSULIN PUMP    . levothyroxine (SYNTHROID) 50 MCG tablet Take 50 mcg by mouth See admin instructions. Take very day except : Sunday    . levothyroxine (SYNTHROID) 75 MCG tablet Take 75  mcg by mouth every Sunday.    . lisdexamfetamine (VYVANSE) 20 MG capsule Take 1 capsule (20 mg total) by mouth daily. 30 capsule 0  . lisdexamfetamine (VYVANSE) 20 MG capsule Take 1 capsule (20 mg total) by mouth daily. 30 capsule 0  . Multiple Vitamin (MULTIVITAMIN WITH MINERALS) TABS tablet Take 2 tablets by mouth daily. DEKA    . Multiple Vitamins-Minerals (ADULT ONE DAILY GUMMIES) CHEW Chew 2 tablets by mouth daily.    [name] NARCAN 4 MG/0.1ML LIQD nasal spray kit Place 1 spray into the nose once as needed for opioid reversal.    . Nutritional Supplements (VIVONEX RTF) LIQD Take 87 mLs by mouth at bedtime. Additional at lunch    . ONETOUCH VERIO test strip 3 (three) times daily.    [name] oxyCODONE (OXY IR/ROXICODONE) 5 MG immediate release tablet Take 5 mg by mouth 3 (three) times daily as needed for severe pain or moderate pain.    [name] oxyCODONE (OXY IR/ROXICODONE) 5 MG immediate release tablet Take 1 tablet (5 mg total) by mouth every 8 (eight) hours as needed for severe pain. 30 tablet 0  . Pancrelipase, Lip-Prot-Amyl, (PERTZYE) 21308-65784 units CPEP Take 1 tablet by mouth 3 (three) times daily with meals. Only when eat or snack    . pantoprazole (PROTONIX) 40 MG tablet Take 80 mg by mouth at bedtime.     . promethazine (PHENERGAN) 12.5 MG tablet Take 1 tablet (12.5 mg total) by mouth every 6 (six) hours as needed for nausea or vomiting. 30 tablet 0  . senna (SENOKOT) 8.6 MG TABS tablet Take 1 tablet by mouth daily as needed for mild constipation.    . Vitamin  D, Ergocalciferol, (DRISDOL) 1.25 MG (50000 UNIT) CAPS capsule Take 50,000 Units by mouth 3 (three) times a week.      No current facility-administered medications for this visit.     Musculoskeletal: Strength & Muscle Tone: N/A Gait & Station: N/A Patient leans: N/A  Psychiatric Specialty Exam: Review of Systems  Psychiatric/Behavioral: Positive for dysphoric mood and sleep disturbance. Negative for agitation, behavioral problems, confusion, decreased concentration, hallucinations, self-injury and suicidal ideas. The patient is nervous/anxious. The patient is not hyperactive.   All other systems reviewed and are negative.   There were no vitals taken for this visit.There is no height or weight on file to calculate BMI.  General Appearance: Fairly Groomed  Eye Contact:  Good  Speech:  Clear and Coherent  Volume:  Normal  Mood:  depressed at times  Affect:  Appropriate, Congruent and down at times  Thought Process:  Coherent  Orientation:  Full (Time, Place, and Person)  Thought Content: Logical   Suicidal Thoughts:  No  Homicidal Thoughts:  No  Memory:  Immediate;   Good  Judgement:  Good  Insight:  Good  Psychomotor Activity:  Normal  Concentration:  Concentration: Good and Attention Span: Good  Recall:  Good  Fund of Knowledge: Good  Language: Good  Akathisia:  No  Handed:  Right  AIMS (if indicated): not done  Assets:  Communication Skills Desire for Improvement  ADL's:  Intact  Cognition: WNL  Sleep:  Fair   Screenings: PHQ2-9   Flowsheet Row Video Visit from 09/02/2020 in [facility]  PHQ-2 Total Score 0    Flowsheet Row Video Visit from 10/07/2020 in [facility] Video Visit from 09/02/2020 in [facility] Admission (Discharged) from 08/07/2020 in [facility]  CATEGORY No Risk No Risk No Risk       Assessment and Plan:   BONETTA MOSTEK is  a 45 y.o. year old female with a history of ADHD, depression,hashimoto's thyroiditis,AIH/Hereditary pancreatitiss/p islet cell transplant,diabetes,s/p Roux-en- Y bypass surgery,left lower extremity bypass with a subsequent balloon angioplasty, who presents for follow up appointment for below.   1. Attention deficit hyperactivity disorder (ADHD), unspecified ADHD type She had adverse reaction from Vyvanse, although it was beneficial for her ADHD symptoms.  Will lower the dose to see if it mitigates these side effects.   2. Insomnia, unspecified type She has significant benefit from Xanax for insomnia, although she still has middle insomnia.  Will continue current dose to target insomnia. Noted that she has had no benefits from other hypnotics despite multiple of trials.   # Adjustment disorder with depressed mood She reports occasional depressive symptoms in the context of her mother having stage IV lung cancer.  She prefers not to start antidepressant and utilizing coping skills at this time.  We will continue to monitor.   This clinician has discussed the side effect associated with medication prescribed during this encounter. Please refer to notes in the previous encounters for more details.   Plan 1. Decrease Vyvanse 10 mg daily 2. Continue Xanax 0.5 mg at night as needed for sleep  3.Next appointment: 5/17 at 11:30 , video   Past trials of medication:sertraline, fluoxetine,Paxil, citalopram, lexapro, bupropion (voices), venlafaxine, duloxetine, trintellix, mirtazapine, trazodone, amitriptyline,doxepin (insomnia),lamotrigine,Geodon, olanzapine,quetiapine,Vraylar, xanax, clonazepam,temazepam,ritalin, trazodone, Ambien, Rozerem,Lunesta, Belsomra, benadryl,Concerta (palpitation at 36 mg)  The patient demonstrates the following risk factors for suicide: Chronic risk factors for suicide include:psychiatric disorder ofdepressionand medical illness pancreatitis. Acute  risk factorsfor suicide include: unemployment. Protective factorsfor this patient include: positive social support, coping skills and hope for the future. Considering these factors, the overall suicide risk at this point appears to below. Patientisappropriate for outpatient follow up.    Jessica Clay, MD 10/07/2020, 8:48 AM

## 2020-10-07 ENCOUNTER — Encounter: Payer: Self-pay | Admitting: Psychiatry

## 2020-10-07 ENCOUNTER — Other Ambulatory Visit: Payer: Self-pay

## 2020-10-07 ENCOUNTER — Telehealth (INDEPENDENT_AMBULATORY_CARE_PROVIDER_SITE_OTHER): Payer: Medicare (Managed Care) | Admitting: Psychiatry

## 2020-10-07 DIAGNOSIS — G4700 Insomnia, unspecified: Secondary | ICD-10-CM | POA: Diagnosis not present

## 2020-10-07 DIAGNOSIS — F909 Attention-deficit hyperactivity disorder, unspecified type: Secondary | ICD-10-CM

## 2020-10-07 MED ORDER — LISDEXAMFETAMINE DIMESYLATE 10 MG PO CAPS: 10.0000 mg | ORAL_CAPSULE | Freq: Every day | ORAL | 0 refills | Status: DC

## 2020-10-07 MED ORDER — ALPRAZOLAM 0.5 MG PO TABS: 0.5000 mg | ORAL_TABLET | Freq: Every evening | ORAL | 2 refills | Status: DC | PRN

## 2020-10-07 NOTE — Patient Instructions (Signed)
1. Decrease Vyvanse 10 mg daily 2. Continue Xanax 0.5 mg at night as needed for sleep  3.Next appointment: 5/17 at 11:30

## 2020-10-22 ENCOUNTER — Other Ambulatory Visit: Payer: Self-pay

## 2020-10-22 DIAGNOSIS — I739 Peripheral vascular disease, unspecified: Secondary | ICD-10-CM

## 2020-10-22 MED ORDER — CLOPIDOGREL BISULFATE 75 MG PO TABS: 75.0000 mg | ORAL_TABLET | Freq: Every day | ORAL | 3 refills | Status: AC

## 2020-10-23 ENCOUNTER — Telehealth: Payer: Self-pay

## 2020-10-23 NOTE — Telephone Encounter (Signed)
Patient called to report wound on right pinky toe. X-rays taken showed osteomyeltis. She says "she will fight to the death to not lose her foot." Placed her on [facility] schedule to evaluate.

## 2020-10-29 ENCOUNTER — Ambulatory Visit: Payer: Medicare (Managed Care)

## 2020-10-31 ENCOUNTER — Other Ambulatory Visit: Payer: Self-pay

## 2020-10-31 ENCOUNTER — Ambulatory Visit (INDEPENDENT_AMBULATORY_CARE_PROVIDER_SITE_OTHER): Payer: Medicare (Managed Care) | Admitting: Vascular Surgery

## 2020-10-31 ENCOUNTER — Encounter: Payer: Self-pay | Admitting: Vascular Surgery

## 2020-10-31 VITALS — BP 146/88 | HR 84 | Temp 98.7°F | Resp 20 | Ht 66.0 in | Wt 132.0 lb

## 2020-10-31 DIAGNOSIS — I739 Peripheral vascular disease, unspecified: Secondary | ICD-10-CM

## 2020-10-31 NOTE — Progress Notes (Signed)
    Subjective:     Patient ID: Jessica [name], female   DOB: 1975/08/28, 45 y.o.   MRN: [id number]  HPI 45 year old female with history of diabetes now status post aortobifemoral bypass.  She has persistent wound of the right lateral foot she has had drainage.  She underwent recent x-ray which was concerning for osteomyelitis.  She has seen her podiatrist who is recommended evaluation by vascular surgery.  She has healed her aortobifemoral wounds well.   Review of Systems Right foot wound    Objective:   Physical Exam Vitals:   10/31/20 1047  BP: (!) 146/88  Pulse: 84  Resp: 20  Temp: 98.7 F (37.1 C)  SpO2: 97%  Awake alert oriented On lab respirations Midline abdominal incision healing well with GJ tube in place Palpable right dorsalis pedis pulse Right lateral foot wound with tracking deep near the metatarsal head of the fifth toe     Assessment:     45 year old female with previous aortobifemoral bypass with right lateral foot wound    Plan:     We have discussed her options being MRI versus wound care versus both versus fifth toe amputation.  Patient is elected for fifth toe amputation which we will get scheduled in the near future.    Janal Haak C. Randie Heinz, MD [facility] Office: [phone] Pager: [phone]

## 2020-10-31 NOTE — H&P (View-Only) (Signed)
    Subjective:     Patient ID: Jessica [name], female   DOB: 06/18/1976, 45 y.o.   MRN: [id number]  HPI 45-year-old female with history of diabetes now status post aortobifemoral bypass.  She has persistent wound of the right lateral foot she has had drainage.  She underwent recent x-ray which was concerning for osteomyelitis.  She has seen her podiatrist who is recommended evaluation by vascular surgery.  She has healed her aortobifemoral wounds well.   Review of Systems Right foot wound    Objective:   Physical Exam Vitals:   10/31/20 1047  BP: (!) 146/88  Pulse: 84  Resp: 20  Temp: 98.7 F (37.1 C)  SpO2: 97%  Awake alert oriented On lab respirations Midline abdominal incision healing well with GJ tube in place Palpable right dorsalis pedis pulse Right lateral foot wound with tracking deep near the metatarsal head of the fifth toe     Assessment:     45-year-old female with previous aortobifemoral bypass with right lateral foot wound    Plan:     We have discussed her options being MRI versus wound care versus both versus fifth toe amputation.  Patient is elected for fifth toe amputation which we will get scheduled in the near future.    Kamera Dubas C. Alyiah Ulloa, MD [facility] Office: [phone] Pager: [phone]  

## 2020-11-03 ENCOUNTER — Other Ambulatory Visit: Payer: Self-pay

## 2020-11-03 ENCOUNTER — Encounter (HOSPITAL_COMMUNITY): Payer: Self-pay | Admitting: Vascular Surgery

## 2020-11-03 NOTE — Progress Notes (Signed)
PCP - Dr Imagene Sheller Cardiologist - n/a Behavioral Health - Dr Neysa Hotter  Chest x-ray - 08/08/20 (1V) EKG - 07/30/20 Stress Test - 08/01/20 ECHO - 2018 Cardiac Cath - n/a  Fasting Blood Sugar - 100-120s Checks Blood Sugar 4-5 times a day  . If your blood sugar is less than 70 mg/dL, you will need to treat for low blood sugar: o Treat a low blood sugar (less than 70 mg/dL) with  cup of clear juice (cranberry or apple), 4 glucose tablets, OR glucose gel. o Recheck blood sugar in 15 minutes after treatment (to make sure it is greater than 70 mg/dL). If your blood sugar is not greater than 70 mg/dL on recheck, call [phone] for further instructions.  Blood Thinner Instructions:  Follow your surgeon's instructions on when to stop plavix prior to surgery.  Anesthesia review: Yes  STOP now taking any Aspirin (unless otherwise instructed by your surgeon), Aleve, Naproxen, Ibuprofen, Motrin, Advil, Goody's, BC's, all herbal medications, fish oil, and all vitamins.   Coronavirus Screening Covid test is scheduled on DOS. Do you have any of the following symptoms:  Cough yes/no: No Fever (>100.104F)  yes/no: No Runny nose yes/no: No Sore throat yes/no: No Difficulty breathing/shortness of breath  yes/no: No  Have you traveled in the last 14 days and where? yes/no: No  Patient verbalized understanding of instructions that were given via phone.

## 2020-11-04 ENCOUNTER — Encounter: Payer: Self-pay | Admitting: Psychiatry

## 2020-11-04 ENCOUNTER — Telehealth (INDEPENDENT_AMBULATORY_CARE_PROVIDER_SITE_OTHER): Payer: Medicare (Managed Care) | Admitting: Psychiatry

## 2020-11-04 DIAGNOSIS — G4700 Insomnia, unspecified: Secondary | ICD-10-CM

## 2020-11-04 DIAGNOSIS — F909 Attention-deficit hyperactivity disorder, unspecified type: Secondary | ICD-10-CM | POA: Diagnosis not present

## 2020-11-04 MED ORDER — LISDEXAMFETAMINE DIMESYLATE 10 MG PO CAPS: 10.0000 mg | ORAL_CAPSULE | Freq: Every day | ORAL | 0 refills | Status: DC

## 2020-11-04 NOTE — Progress Notes (Signed)
Virtual Visit via Video Note  I connected with Jessica [name] on 11/04/20 at 11:30 AM EDT by a video enabled telemedicine application and verified that I am speaking with the correct person using two identifiers.  Location: Patient: home Provider: office Persons participated in the visit- patient, provider   I discussed the limitations of evaluation and management by telemedicine and the availability of in person appointments. The patient expressed understanding and agreed to proceed.    I discussed the assessment and treatment plan with the patient. The patient was provided an opportunity to ask questions and all were answered. The patient agreed with the plan and demonstrated an understanding of the instructions.   The patient was advised to call back or seek an in-person evaluation if the symptoms worsen or if the condition fails to improve as anticipated.  I provided 15 minutes of non-face-to-face time during this encounter.   Norman Clay, MD    [facility] MD/PA/NP OP Progress Note  11/04/2020 12:01 PM Jessica [name]  MRN:  [id number]  Chief Complaint:  Chief Complaint    Follow-up; ADHD     HPI:  This is a follow-up appointment for ADHD and insomnia.  She states that she has been doing well since lowering the dose of Vyvanse.  She has been able to focus well, and has no adverse reaction.  She visits her mother, and states they are at least a few times per week at night.  Although her condition got worsened last month, she is now at the peak.  Although she feels good about this, she feels worried about her condition in the future.  She reports great relationship with her mother.  She does not feel that she is a black sheep anymore.  Although she occasionally feels anxious and depressed around the condition of her mother, she tries to make herself do things to feel better.  She also gym and does treadmill 30 minutes 3 times a week.  She takes a walk on other days.  She sleeps a little  better when she does exercise.  Although she tries not to take Xanax, she ends up taking 90% of the time.  She denies alcohol use or drug use.  She feels comfortable to stay on the current medication.   Support: 2 sisters Marital status:married Number of children:0  Visit Diagnosis:    ICD-10-CM   1. Attention deficit hyperactivity disorder (ADHD), unspecified ADHD type  F90.9   2. Insomnia, unspecified type  G47.00     Past Psychiatric History: Please see initial evaluation for full details. I have reviewed the history. No updates at this time.     Past Medical History:  Past Medical History:  Diagnosis Date  . ADHD (attention deficit hyperactivity disorder)   . Anxiety   . Arthritis    mid back  . Diabetes mellitus type I ([place])   . Diverticulosis of colon without hemorrhage   . DM (diabetes mellitus) ([place]) 11/2013   post pancreatectomy - insulin pump - humalog  . Feeding by G-tube ([place])   . GERD (gastroesophageal reflux disease)   . Hashimoto's thyroiditis    euthyroid  . Hypercholesterolemia   . Hypothyroidism   . PONV (postoperative nausea and vomiting)     Past Surgical History:  Procedure Laterality Date  . ABDOMINAL AORTOGRAM W/LOWER EXTREMITY N/A 04/27/2017   Procedure: ABDOMINAL AORTOGRAM W/LOWER EXTREMITY;  Surgeon: Waynetta Sandy, MD;  Location: [place] [facility];  Service: Cardiovascular;  Laterality: N/A;  .  ABDOMINAL AORTOGRAM W/LOWER EXTREMITY Bilateral 06/04/2019   Procedure: ABDOMINAL AORTOGRAM W/LOWER EXTREMITY;  Surgeon: Waynetta Sandy, MD;  Location: [place] [facility];  Service: Cardiovascular;  Laterality: Bilateral;  . ABDOMINAL AORTOGRAM W/LOWER EXTREMITY N/A 11/26/2019   Procedure: ABDOMINAL AORTOGRAM W/ Right LOWER EXTREMITY [place];  Surgeon: Waynetta Sandy, MD;  Location: [place] [facility];  Service: Cardiovascular;  Laterality: N/A;  . ABDOMINAL AORTOGRAM W/LOWER EXTREMITY N/A 07/28/2020   Procedure: ABDOMINAL  AORTOGRAM W/LOWER EXTREMITY;  Surgeon: Waynetta Sandy, MD;  Location: [place] [facility];  Service: Cardiovascular;  Laterality: N/A;  . ABDOMINAL HYSTERECTOMY    . AORTA - BILATERAL FEMORAL ARTERY BYPASS GRAFT Bilateral 08/07/2020   Procedure: AORTA BIFEMORAL BYPASS GRAFTING;  Surgeon: Waynetta Sandy, MD;  Location: [place];  Service: Vascular;  Laterality: Bilateral;  . BIOPSY N/A 05/15/2014   Procedure: GASTIC,  ASCENDING, AND DESCEDNING/SIGMOID  COLON BIOPSY;  Surgeon: Daneil Dolin, MD;  Location: [facility];  Service: Endoscopy;  Laterality: N/A;  . CHOLECYSTECTOMY    . COLONOSCOPY WITH PROPOFOL N/A 05/15/2014   Dr. Gala Romney (primary GI is Dr. Oneida Alar). Hemorrhoids, negative random colon biopsies. GI pathogen panel negative. Diverticulosis.  [name] complete hysterectomy    . ESOPHAGOGASTRODUODENOSCOPY (EGD) WITH PROPOFOL N/A 05/15/2014   Dr. Gala Romney (primary GI Dr. Oneida Alar), Billroth II, inflamed residual gastric mucosa, benign biopsies  . FEMORAL-POPLITEAL BYPASS GRAFT    . 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  [name] KNEE SURGERY Right    X 6-5 arthroscopies and open procedure 1 time- tighten patellar tendon and loose IT band  . 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  [name] PERIPHERAL VASCULAR INTERVENTION Right 06/04/2019   Procedure: PERIPHERAL VASCULAR INTERVENTION;  Surgeon: Waynetta Sandy, MD;  Location: [place] [facility];  Service: Cardiovascular;  Laterality: Right;  common iliac  . PERIPHERAL VASCULAR INTERVENTION Right 11/26/2019   Procedure: PERIPHERAL VASCULAR INTERVENTION;  Surgeon: Waynetta Sandy, MD;  Location: [place] [facility];  Service: Cardiovascular;  Laterality: Right;  Iliac     Family Psychiatric History: Please see initial evaluation for full details. I have reviewed the history. No updates at this time.     Family History:  Family History  Problem Relation Age of Onset  . Pancreatic  cancer Paternal Grandfather   . Cirrhosis Paternal Grandfather   . Heart attack Paternal Grandfather   . Pancreatitis Other        cousin  . Throat cancer Mother   . Peripheral Artery Disease Mother   . Depression Father   . Pancreatitis Father   . Colon cancer Neg Hx     Social History:  Social History   Socioeconomic History  . Marital status: Married    Spouse name: Not on file  . Number of children: 0  . Years of education: Not on file  . Highest education level: Not on file  Occupational History  . Occupation: disabled  Tobacco Use  . Smoking status: Former Smoker    Packs/day: 2.00    Years: 30.00    Pack years: 60.00    Types: Cigarettes    Quit date: 04/21/2020    Years since quitting: 0.5  . Smokeless tobacco: Never Used  . Tobacco comment: 1 cig daily  Vaping Use  . Vaping Use: Some days  Substance and Sexual Activity  . Alcohol use: No    Alcohol/week: 0.0 standard drinks  . Drug use: No  . Sexual activity: Yes  Birth control/protection: None    Comment: Hysterectomy  Other Topics Concern  . Not on file  Social History Narrative  . Not on file   Social Determinants of Health   Financial Resource Strain: Not on file  Food Insecurity: Not on file  Transportation Needs: Not on file  Physical Activity: Not on file  Stress: Not on file  Social Connections: Not on file    Allergies:  Allergies  Allergen Reactions  . Erythromycin Anaphylaxis, Hives and Nausea Only  . Levofloxacin Other (See Comments)    Burning during administration IV prior to surgery.Pt was told not to take it again.  . Niaspan [Niacin] Anaphylaxis  . Other Anaphylaxis and Other (See Comments)    Derma Bond severe rash  . Penicillins Anaphylaxis    Has patient had a PCN reaction causing immediate rash, facial/tongue/throat swelling, SOB or lightheadedness with hypotension: Yes Has patient had a PCN reaction causing severe rash involving mucus membranes or skin necrosis:  No Has patient had a PCN reaction that required hospitalization: No Has patient had a PCN reaction occurring within the last 10 years: No If all of the above answers are "NO", then may proceed with Cephalosporin use.   . Shellfish Allergy Anaphylaxis  . Sulfa Antibiotics Anaphylaxis    "Blisters in my throat"  . Wellbutrin [Bupropion] Other (See Comments)    Suicidal thoughts while on Lexapro  . Morphine Other (See Comments)    ineffective  . Strawberry Extract Hives  . Sulfamethoxazole Other (See Comments)    Blister in throat, sores in mouth  . Tape Other (See Comments)    Nicoderm Adhesive Patch cause blisters   . Cefdinir     Unknown reaction   . Clindamycin/Lincomycin Other (See Comments)    Caused C-Diff  . Doxycycline Hives and Nausea Only    A 11m tablet formulation caused hives, nausea, vomiting. Can take 50 mg  . Keflex [Cephalexin] Other (See Comments)    Hallucinations  . Statins Other (See Comments)    Muscle pain  . Vancomycin Hives and Nausea Only  . Zetia [Ezetimibe] Diarrhea  . 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 lisdexamfetamine (VYVANSE) 10 MG capsule Take 1 capsule (10 mg total) by mouth daily before breakfast. 30 capsule 0  . [START ON 12/04/2020] lisdexamfetamine (VYVANSE) 10 MG capsule Take 1 capsule (10 mg total) by mouth daily before breakfast. 30 capsule 0  . [START  ON 01/03/2021] lisdexamfetamine (VYVANSE) 10 MG capsule Take 1 capsule (10 mg total) by mouth daily before breakfast. 30 capsule 0  . ALPRAZolam (XANAX) 0.5 MG tablet Take 1 tablet (0.5 mg total) by mouth at bedtime as needed for anxiety. 30 tablet 2  . clopidogrel (PLAVIX) 75 MG tablet Take 1 tablet (75 mg total) by mouth daily. (Patient taking differently: Take 75 mg by mouth in the morning.) 90 tablet 3  .  EPIPEN 2-PAK 0.3 MG/0.3ML SOAJ injection Inject 0.3 mg into the skin daily as needed for anaphylaxis (allergic reaction).    . Evolocumab 140 MG/ML SOSY Inject 140 mg into the skin every 14 (fourteen) days. Repatha    . glucose blood (PRECISION QID TEST) test strip 1 strip by external route three times daily One touch verio    . insulin lispro (HUMALOG) 100 UNIT/ML injection continuous. INSULIN PUMP    . levothyroxine (SYNTHROID) 50 MCG tablet Take 50 mcg by mouth See admin instructions. Take 1 tablet (50 mcg) by mouth on Mondays, Tuesdays, Wednesdays, Thursdays, Fridays & Saturdays    . levothyroxine (SYNTHROID) 75 MCG tablet Take 75 mcg by mouth every Sunday.    . Multiple Vitamin (MULTIVITAMIN WITH MINERALS) TABS tablet Take 2 tablets by mouth daily. DEKA    . NARCAN 4 MG/0.1ML LIQD nasal spray kit Place 1 spray into the nose once as needed for opioid reversal.    . Nutritional Supplements (VIVONEX RTF) LIQD Take 1,250-1,750 mLs by mouth at bedtime. Additional at lunch    . ONETOUCH VERIO test strip 3 (three) times daily.    [name] oxyCODONE (OXY IR/ROXICODONE) 5 MG immediate release tablet Take 1 tablet (5 mg total) by mouth every 8 (eight) hours as needed for severe pain. (Patient taking differently: Take 5 mg by mouth in the morning, at noon, and at bedtime.) 30 tablet 0  . Pancrelipase, Lip-Prot-Amyl, (PERTZYE) 10272-53664 units CPEP Take 1 tablet by mouth 3 (three) times daily as needed (with real meals/snack (nutrition not by feeding tube)). Only when eat or snack    . pantoprazole (PROTONIX)  40 MG tablet Take 80 mg by mouth at bedtime.     . promethazine (PHENERGAN) 12.5 MG tablet Take 1 tablet (12.5 mg total) by mouth every 6 (six) hours as needed for nausea or vomiting. (Patient not taking: Reported on 10/31/2020) 30 tablet 0  . senna (SENOKOT) 8.6 MG TABS tablet Take 1 tablet by mouth daily as needed for mild constipation.    . Vitamin D, Ergocalciferol, (DRISDOL) 1.25 MG (50000 UNIT) CAPS capsule Take 50,000 Units by mouth every Monday, Wednesday, and Friday.     No current facility-administered medications for this visit.     Musculoskeletal: Strength & Muscle Tone: N/A Gait & Station: N/A Patient leans: N/A  Psychiatric Specialty Exam: Review of Systems  Psychiatric/Behavioral: Positive for dysphoric mood and sleep disturbance. Negative for agitation, behavioral problems, confusion, decreased concentration, hallucinations, self-injury and suicidal ideas. The patient is nervous/anxious. The patient is not hyperactive.   All other systems reviewed and are negative.   There were no vitals taken for this visit.There is no height or weight on file to calculate BMI.  General Appearance: Fairly Groomed  Eye Contact:  Good  Speech:  Clear and Coherent  Volume:  Normal  Mood:  good  Affect:  Appropriate, Congruent and Full Range  Thought Process:  Coherent  Orientation:  Full (Time, Place, and Person)  Thought Content: Logical   Suicidal Thoughts:  No  Homicidal Thoughts:  No  Memory:  Immediate;   Good  Judgement:  Good  Insight:  Good  Psychomotor Activity:  Normal  Concentration:  Concentration: Good and Attention Span: Good  Recall:  Good  Fund of Knowledge: Good  Language: Good  Akathisia:  No  Handed:  Right  AIMS (if indicated): not done  Assets:  Communication Skills Desire for Improvement  ADL's:  Intact  Cognition: WNL  Sleep:  Fair  Screenings: PHQ2-9   Flowsheet Row Video Visit from 11/04/2020 in [place] [facility]  Visit from 09/02/2020 in [place] [facility]  PHQ-2 Total Score 1 0    Flowsheet Row Video Visit from 11/04/2020 in [place] Video Visit from 10/07/2020 in [place] Video Visit from 09/02/2020 in [place] No Risk No Risk No Risk       Assessment and Plan:  ZNIYA COTTONE is a 45 y.o. year old female with a history of ADHD, depression,hashimoto's thyroiditis,AIH/Hereditary pancreatitiss/p islet cell transplant,diabetes,s/p Roux-en- Y bypass surgery,left lower extremity bypass with a subsequent balloon angioplasty, , who presents for follow up appointment for below.   1. Attention deficit hyperactivity disorder (ADHD), unspecified ADHD type She denies any side effect from Vyvanse since lowering the dose, and it has been beneficial for ADHD symptoms.  We will continue the current dose to target ADHD.   2. Insomnia, unspecified type Although she has occasional middle insomnia, she has significant benefit from Xanax.  We will continue the current dose to target insomnia. Noted that she has had no benefits from other hypnotics despite multiple of trials.   # Adjustment disorder with depressed mood Although she reports occasional depressive symptoms and anxiety in the context of her mother having stage IV lung cancer, she has been wondering things relatively well.  Continue to monitor without psychotropics.   This clinician has discussed the side effect associated with medication prescribed during this encounter. Please refer to notes in the previous encounters for more details.   Plan 1.Continue Vyvanse10 mg daily 2.ContinueXanax 0.5 mg at night as needed for sleep  3.Next appointment:8/15 at 1 PM for 20 mins , video  I have utilized the [place] Controlled Substances Reporting System (PMP AWARxE) to confirm adherence regarding the patient's  medication. My review reveals appropriate prescription fills.   Past trials of medication:sertraline, fluoxetine,Paxil, citalopram, lexapro, bupropion (voices), venlafaxine, duloxetine, trintellix, mirtazapine, trazodone, amitriptyline,doxepin (insomnia),lamotrigine,Geodon, olanzapine,quetiapine,Vraylar, xanax, clonazepam,temazepam,ritalin, trazodone, Ambien, Rozerem,Lunesta, Belsomra, benadryl,Concerta (palpitation at 36 mg)  The patient demonstrates the following risk factors for suicide: Chronic risk factors for suicide include:psychiatric disorder ofdepressionand medical illness pancreatitis. Acute risk factorsfor suicide include: unemployment. Protective factorsfor this patient include: positive social support, coping skills and hope for the future. Considering these factors, the overall suicide risk at this point appears to below. Patientisappropriate for outpatient follow up.   Norman Clay, MD 11/04/2020, 12:01 PM

## 2020-11-04 NOTE — Patient Instructions (Signed)
1.Continue Vyvanse10 mg daily 2.ContinueXanax 0.5 mg at night as needed for sleep  3.Next appointment:8/15 at 1 PM

## 2020-11-05 ENCOUNTER — Encounter (HOSPITAL_COMMUNITY): Payer: Self-pay | Admitting: Vascular Surgery

## 2020-11-05 ENCOUNTER — Ambulatory Visit (HOSPITAL_COMMUNITY)
Admission: RE | Admit: 2020-11-05 | Discharge: 2020-11-05 | Disposition: A | Discharge status: Home / Self Care | Payer: Medicaid Other | Attending: Vascular Surgery | Admitting: Vascular Surgery

## 2020-11-05 ENCOUNTER — Ambulatory Visit (HOSPITAL_COMMUNITY): Payer: Medicaid Other | Admitting: Certified Registered Nurse Anesthetist

## 2020-11-05 ENCOUNTER — Other Ambulatory Visit: Payer: Self-pay

## 2020-11-05 ENCOUNTER — Encounter (HOSPITAL_COMMUNITY)
Admission: RE | Disposition: A | Discharge status: Home / Self Care | Payer: Self-pay | Source: Home / Self Care | Attending: Vascular Surgery

## 2020-11-05 DIAGNOSIS — L97516 Non-pressure chronic ulcer of other part of right foot with bone involvement without evidence of necrosis: Secondary | ICD-10-CM | POA: Insufficient documentation

## 2020-11-05 DIAGNOSIS — E1151 Type 2 diabetes mellitus with diabetic peripheral angiopathy without gangrene: Secondary | ICD-10-CM | POA: Diagnosis not present

## 2020-11-05 DIAGNOSIS — Z794 Long term (current) use of insulin: Secondary | ICD-10-CM | POA: Diagnosis not present

## 2020-11-05 DIAGNOSIS — Z95820 Peripheral vascular angioplasty status with implants and grafts: Secondary | ICD-10-CM | POA: Diagnosis not present

## 2020-11-05 DIAGNOSIS — Z87891 Personal history of nicotine dependence: Secondary | ICD-10-CM | POA: Diagnosis not present

## 2020-11-05 DIAGNOSIS — E11621 Type 2 diabetes mellitus with foot ulcer: Secondary | ICD-10-CM | POA: Insufficient documentation

## 2020-11-05 DIAGNOSIS — I70235 Atherosclerosis of native arteries of right leg with ulceration of other part of foot: Secondary | ICD-10-CM | POA: Diagnosis not present

## 2020-11-05 HISTORY — DX: Unspecified osteoarthritis, unspecified site: M19.90

## 2020-11-05 HISTORY — PX: AMPUTATION: SHX166

## 2020-11-05 HISTORY — DX: Hypothyroidism, unspecified: E03.9

## 2020-11-05 LAB — POCT I-STAT, CHEM 8
BUN: 4 mg/dL — ABNORMAL LOW (ref 6–20)
Calcium, Ion: 1.05 mmol/L — ABNORMAL LOW (ref 1.15–1.40)
Chloride: 105 mmol/L (ref 98–111)
Creatinine, Ser: 0.5 mg/dL (ref 0.44–1.00)
Glucose, Bld: 190 mg/dL — ABNORMAL HIGH (ref 70–99)
HCT: 42 % (ref 36.0–46.0)
Hemoglobin: 14.3 g/dL (ref 12.0–15.0)
Potassium: 3.6 mmol/L (ref 3.5–5.1)
Sodium: 139 mmol/L (ref 135–145)
TCO2: 29 mmol/L (ref 22–32)

## 2020-11-05 LAB — GLUCOSE, CAPILLARY
Glucose-Capillary: 194 mg/dL — ABNORMAL HIGH (ref 70–99)
Glucose-Capillary: 148 mg/dL — ABNORMAL HIGH (ref 70–99)
Glucose-Capillary: 156 mg/dL — ABNORMAL HIGH (ref 70–99)

## 2020-11-05 SURGERY — AMPUTATION DIGIT
Anesthesia: Monitor Anesthesia Care | Site: Toe | Laterality: Right

## 2020-11-05 MED ORDER — LACTATED RINGERS IV SOLN: INTRAVENOUS | Status: DC | PRN

## 2020-11-05 MED ORDER — CHLORHEXIDINE GLUCONATE 0.12 % MT SOLN: 15.0000 mL | Freq: Once | OROMUCOSAL | Status: DC

## 2020-11-05 MED ORDER — FENTANYL CITRATE (PF) 100 MCG/2ML IJ SOLN
INTRAMUSCULAR | Status: AC
  Administered 2020-11-05: 50 ug via INTRAVENOUS
  Filled 2020-11-05: qty 2

## 2020-11-05 MED ORDER — MIDAZOLAM HCL 5 MG/5ML IJ SOLN
INTRAMUSCULAR | Status: DC | PRN
  Administered 2020-11-05: 1 mg via INTRAVENOUS

## 2020-11-05 MED ORDER — ACETAMINOPHEN 10 MG/ML IV SOLN: 1000.0000 mg | Freq: Once | INTRAVENOUS | Status: DC | PRN

## 2020-11-05 MED ORDER — ACETAMINOPHEN 160 MG/5ML PO SOLN: 1000.0000 mg | Freq: Once | ORAL | Status: DC | PRN

## 2020-11-05 MED ORDER — CHLORHEXIDINE GLUCONATE 4 % EX LIQD: 60.0000 mL | Freq: Once | CUTANEOUS | Status: DC

## 2020-11-05 MED ORDER — 0.9 % SODIUM CHLORIDE (POUR BTL) OPTIME
TOPICAL | Status: DC | PRN
  Administered 2020-11-05: 1000 mL

## 2020-11-05 MED ORDER — MIDAZOLAM HCL 2 MG/2ML IJ SOLN
INTRAMUSCULAR | Status: AC
  Administered 2020-11-05: 2 mg via INTRAVENOUS
  Filled 2020-11-05: qty 2

## 2020-11-05 MED ORDER — OXYCODONE HCL 5 MG PO TABS: 5.0000 mg | ORAL_TABLET | Freq: Three times a day (TID) | ORAL | 0 refills | Status: AC

## 2020-11-05 MED ORDER — ACETAMINOPHEN 500 MG PO TABS: 1000.0000 mg | ORAL_TABLET | Freq: Once | ORAL | Status: DC | PRN

## 2020-11-05 MED ORDER — FENTANYL CITRATE (PF) 100 MCG/2ML IJ SOLN: 25.0000 ug | INTRAMUSCULAR | Status: DC | PRN

## 2020-11-05 MED ORDER — ONDANSETRON HCL 4 MG/2ML IJ SOLN
INTRAMUSCULAR | Status: DC | PRN
  Administered 2020-11-05: 4 mg via INTRAVENOUS

## 2020-11-05 MED ORDER — OXYCODONE HCL 5 MG/5ML PO SOLN
5.0000 mg | Freq: Once | ORAL | Status: DC | PRN
Start: 2020-11-05 — End: 2020-11-05

## 2020-11-05 MED ORDER — ORAL CARE MOUTH RINSE: 15.0000 mL | Freq: Once | OROMUCOSAL | Status: DC

## 2020-11-05 MED ORDER — FENTANYL CITRATE (PF) 100 MCG/2ML IJ SOLN
50.0000 ug | Freq: Once | INTRAMUSCULAR | Status: AC
Start: 2020-11-05 — End: 2020-11-05

## 2020-11-05 MED ORDER — OXYCODONE HCL 5 MG PO TABS: 5.0000 mg | ORAL_TABLET | Freq: Once | ORAL | Status: DC | PRN

## 2020-11-05 MED ORDER — LACTATED RINGERS IV SOLN: INTRAVENOUS | Status: DC

## 2020-11-05 MED ORDER — FENTANYL CITRATE (PF) 250 MCG/5ML IJ SOLN
INTRAMUSCULAR | Status: AC
  Filled 2020-11-05: qty 5

## 2020-11-05 MED ORDER — MIDAZOLAM HCL 2 MG/2ML IJ SOLN
INTRAMUSCULAR | Status: AC
  Filled 2020-11-05: qty 2

## 2020-11-05 MED ORDER — MIDAZOLAM HCL 2 MG/2ML IJ SOLN: 2.0000 mg | Freq: Once | INTRAMUSCULAR | Status: AC

## 2020-11-05 MED ORDER — VANCOMYCIN HCL IN DEXTROSE 1-5 GM/200ML-% IV SOLN
1000.0000 mg | INTRAVENOUS | Status: AC
  Administered 2020-11-05: 1000 mg via INTRAVENOUS
  Filled 2020-11-05: qty 200

## 2020-11-05 MED ORDER — PROPOFOL 500 MG/50ML IV EMUL
INTRAVENOUS | Status: DC | PRN
  Administered 2020-11-05: 100 ug/kg/min via INTRAVENOUS

## 2020-11-05 MED ORDER — LIDOCAINE HCL (CARDIAC) PF 100 MG/5ML IV SOSY
PREFILLED_SYRINGE | INTRAVENOUS | Status: DC | PRN
  Administered 2020-11-05: 60 mg via INTRAVENOUS

## 2020-11-05 MED ORDER — CHLORHEXIDINE GLUCONATE 0.12 % MT SOLN
OROMUCOSAL | Status: AC
  Administered 2020-11-05: 15 mL
  Filled 2020-11-05: qty 15

## 2020-11-05 MED ORDER — SODIUM CHLORIDE 0.9 % IV SOLN: INTRAVENOUS | Status: DC

## 2020-11-05 MED ORDER — PROPOFOL 10 MG/ML IV BOLUS
INTRAVENOUS | Status: DC | PRN
  Administered 2020-11-05: 20 mg via INTRAVENOUS

## 2020-11-05 SURGICAL SUPPLY — 32 items
BLADE AVERAGE 25X9 (BLADE) IMPLANT
BLADE SAW SGTL 81X20 HD (BLADE) IMPLANT
BNDG ELASTIC 4X5.8 VLCR STR LF (GAUZE/BANDAGES/DRESSINGS) ×2 IMPLANT
BNDG GAUZE ELAST 4 BULKY (GAUZE/BANDAGES/DRESSINGS) ×2 IMPLANT
CANISTER SUCT 3000ML PPV (MISCELLANEOUS) ×2 IMPLANT
CLIP LIGATING EXTRA MED SLVR (CLIP) ×2 IMPLANT
CLIP LIGATING EXTRA SM BLUE (MISCELLANEOUS) ×2 IMPLANT
COVER SURGICAL LIGHT HANDLE (MISCELLANEOUS) ×2 IMPLANT
COVER WAND RF STERILE (DRAPES) ×2 IMPLANT
DRAPE EXTREMITY T 121X128X90 (DISPOSABLE) ×2 IMPLANT
DRAPE HALF SHEET 40X57 (DRAPES) ×2 IMPLANT
DRSG ADAPTIC 3X8 NADH LF (GAUZE/BANDAGES/DRESSINGS) ×2 IMPLANT
ELECT REM PT RETURN 9FT ADLT (ELECTROSURGICAL) ×2
ELECTRODE REM PT RTRN 9FT ADLT (ELECTROSURGICAL) ×1 IMPLANT
GAUZE SPONGE 4X4 12PLY STRL (GAUZE/BANDAGES/DRESSINGS) ×2 IMPLANT
GAUZE SPONGE 4X4 12PLY STRL LF (GAUZE/BANDAGES/DRESSINGS) ×2 IMPLANT
GLOVE BIO SURGEON STRL SZ7.5 (GLOVE) ×2 IMPLANT
GOWN STRL REUS W/ TWL LRG LVL3 (GOWN DISPOSABLE) ×2 IMPLANT
GOWN STRL REUS W/ TWL XL LVL3 (GOWN DISPOSABLE) ×1 IMPLANT
GOWN STRL REUS W/TWL LRG LVL3 (GOWN DISPOSABLE) ×4
GOWN STRL REUS W/TWL XL LVL3 (GOWN DISPOSABLE) ×2
KIT BASIN OR (CUSTOM PROCEDURE TRAY) ×2 IMPLANT
KIT TURNOVER KIT B (KITS) ×2 IMPLANT
NEEDLE HYPO 25GX1X1/2 BEV (NEEDLE) IMPLANT
NS IRRIG 1000ML POUR BTL (IV SOLUTION) ×2 IMPLANT
PACK GENERAL/GYN (CUSTOM PROCEDURE TRAY) ×2 IMPLANT
PAD ARMBOARD 7.5X6 YLW CONV (MISCELLANEOUS) ×4 IMPLANT
SUT ETHILON 3 0 PS 1 (SUTURE) ×2 IMPLANT
SYR CONTROL 10ML LL (SYRINGE) IMPLANT
TOWEL GREEN STERILE (TOWEL DISPOSABLE) ×4 IMPLANT
UNDERPAD 30X36 HEAVY ABSORB (UNDERPADS AND DIAPERS) ×2 IMPLANT
WATER STERILE IRR 1000ML POUR (IV SOLUTION) ×2 IMPLANT

## 2020-11-05 NOTE — Progress Notes (Signed)
Orthopedic Tech Progress Note Patient Details:  Jessica [name] 02-Feb-1976 [id number]  Ortho Devices Type of Ortho Device: Darco shoe Ortho Device/Splint Location: LLE Ortho Device/Splint Interventions: Ordered       Jessica [name] A Zohra Clavel 11/05/2020, 3:39 PM

## 2020-11-05 NOTE — Anesthesia Preprocedure Evaluation (Signed)
Anesthesia Evaluation  Patient identified by MRN, date of birth, ID band Patient awake    Reviewed: Allergy & Precautions, NPO status , Patient's Chart, lab work & pertinent test results  History of Anesthesia Complications (+) PONV and history of anesthetic complications  Airway Mallampati: I  TM Distance: >3 FB Neck ROM: Full    Dental  (+) Edentulous Upper, Edentulous Lower, Dental Advisory Given   Pulmonary former smoker,    breath sounds clear to auscultation       Cardiovascular + Peripheral Vascular Disease   Rhythm:Regular     Neuro/Psych PSYCHIATRIC DISORDERS Anxiety Depression    GI/Hepatic GERD  ,  Endo/Other  diabetes, Insulin DependentHypothyroidism   Renal/GU      Musculoskeletal  (+) Arthritis ,   Abdominal   Peds  Hematology negative hematology ROS (+) Lab Results      Component                Value               Date                      WBC                      16.8 (H)            08/16/2020                HGB                      14.3                11/05/2020                HCT                      42.0                11/05/2020                MCV                      93.1                08/16/2020                PLT                      433 (H)             08/16/2020              Anesthesia Other Findings   Reproductive/Obstetrics                             Anesthesia Physical Anesthesia Plan  ASA: III  Anesthesia Plan: MAC and Regional   Post-op Pain Management:    Induction: Intravenous  PONV Risk Score and Plan: 3 and Propofol infusion and Treatment may vary due to age or medical condition  Airway Management Planned: Nasal Cannula  Additional Equipment: None  Intra-op Plan:   Post-operative Plan:   Informed Consent: I have reviewed the patients History and Physical, chart, labs and discussed the procedure including the risks, benefits and  alternatives for the proposed anesthesia with the patient or authorized representative who has indicated his/her understanding  and acceptance.     Dental advisory given  Plan Discussed with: CRNA and Surgeon  Anesthesia Plan Comments:         Anesthesia Quick Evaluation

## 2020-11-05 NOTE — Transfer of Care (Signed)
Immediate Anesthesia Transfer of Care Note  Patient: Jessica [name]  Procedure(s) Performed: RIGHT SMALL TOE AMPUTATION (Right Toe)  Patient Location: PACU  Anesthesia Type:MAC  Level of Consciousness: awake, alert , oriented, patient cooperative and responds to stimulation  Airway & Oxygen Therapy: Patient Spontanous Breathing  Post-op Assessment: Report given to RN and Post -op Vital signs reviewed and stable  Post vital signs: Reviewed and stable  Last Vitals:  Vitals Value Taken Time  BP    Temp    Pulse    Resp 15 11/05/20 1452  SpO2    Vitals shown include unvalidated device data.  Last Pain:  Vitals:   11/05/20 1026  TempSrc:   PainSc: 8          Complications: No complications documented.

## 2020-11-05 NOTE — Discharge Instructions (Signed)
Incision Care, Adult An incision is a surgical cut that is made through your skin. Most incisions are closed after a surgical procedure. Your incision may be closed with stitches (sutures), staples, skin glue, or adhesive strips. You may need to return to your health care provider to have sutures or staples removed. This may occur several days or several weeks after your surgery. Until then, the incision needs to be cared for properly to prevent infection. Follow instructions from your health care provider about how to care for your incision. Supplies needed:  Soap, water, and a clean hand towel.  Wound cleanser.  A clean bandage (dressing), if needed.  Cream or ointment, if told by your health care provider.  Clean gauze. How to care for your incision Cleaning the incision Ask your health care provider how to clean the incision. This may include:  Using mild soap and water, or wound cleanser.  Using a clean gauze to pat the incision dry after cleaning it. Dressing changes  Wash your hands with soap and water for at least 20 seconds before and after you change the dressing. If soap and water are not available, use hand sanitizer.  Change your dressing as told by your health care provider.  Leave sutures, staples, skin glue, or adhesive strips in place. These skin closures may need to stay in place for 2 weeks or longer. If adhesive strip edges start to loosen and curl up, you may trim the loose edges. Do not remove adhesive strips completely unless your health care provider tells you to do that.  Apply cream or ointment. Do this only as told by your health care provider.  Cover the incision with a clean dressing. Ask your health care provider when you can begin leaving the incision uncovered. Checking for infection Check your incision area every day for signs of infection. Check for:  More redness, swelling, or pain.  More fluid or blood.  Warmth.  Pus or a bad smell.    Follow these instructions at home Medicines  Take over-the-counter and prescription medicines only as told by your health care provider.  If you were prescribed an antibiotic medicine, cream, or ointment, take or apply it as told by your health care provider. Do not stop using the antibiotic even if your condition improves. Eating and drinking  Eat a diet that includes protein, vitamin A, vitamin C, and other nutrient-rich foods to help the wound heal. ? Foods rich in protein include meat, fish, eggs, dairy, beans, and nuts. ? Foods rich in vitamin A include carrots and dark green, leafy vegetables. ? Foods rich in vitamin C include citrus fruits, tomatoes, broccoli, and peppers.  Drink enough fluid to keep your urine pale yellow. General instructions  Do not take baths, swim, use a hot tub, or do anything that would put the incision underwater until your health care provider approves. Ask your health care provider if you may take showers. You may only be allowed to take sponge baths.  Limit movement around your incision to promote healing. ? Avoid straining, lifting, or exercising for the first 2 weeks after your procedure, or for as long as told by your health care provider. ? Return to your normal activities as told by your health care provider. Ask your health care provider what activities are safe for you.  Do not scratch or pick at the incision. Keep it covered as told by your health care provider.  Protect your incision from the sun when you are   outside for the first 6 months, or for as long as told by your health care provider. Cover up the scar area or apply sunscreen that has an SPF of at least 30.  Do not use any products that contain nicotine or tobacco, such as cigarettes, e-cigarettes, and chewing tobacco. These can delay incision healing after surgery. If you need help quitting, ask your health care provider.  Keep all follow-up visits as told by your health care  provider. This is important.   Contact a health care provider if:  You have any of these signs of infection: ? More redness, swelling, or pain around your incision. ? More fluid or blood coming from your incision. ? Warmth coming from your incision. ? Pus or a bad smell coming from your incision. ? A fever.  You are nauseous or you vomit.  You are dizzy.  Your sutures, staples, skin glue, or adhesive strips come undone. Get help right away if:  You have a red streak on the skin near your incision.  Your incision bleeds through the dressing and the bleeding does not stop with gentle pressure.  The edges of your incision open up and separate.  You have signs of a serious bodily reaction to an infection. These signs may include: ? Fever, shaking chills, or feeling very cold. ? Confusion or anxiety. ? Severe pain. ? Trouble breathing. ? Fast heartbeat. ? Clammy or sweaty skin. ? A rash. These symptoms may represent a serious problem that is an emergency. Do not wait to see if the symptoms will go away. Get medical help right away. Call your local emergency services (911 in the [place] Do not drive yourself to the hospital. Summary  Follow instructions from your health care provider about how to care for your incision.  Wash your hands with soap and water for at least 20 seconds before and after you change the dressing. If soap and water are not available, use hand sanitizer.  Check your incision area every day for signs of infection.  Keep all follow-up visits as told by your health care provider. This is important. This information is not intended to replace advice given to you by your health care provider. Make sure you discuss any questions you have with your health care provider. Document Revised: 03/28/2019 Document Reviewed: 03/28/2019 Elsevier Patient Education  2021 [employer]   

## 2020-11-05 NOTE — Op Note (Signed)
    Patient name: Jessica [name] MRN: [id number] DOB: 1976/04/15 Sex: female  11/05/2020 Pre-operative Diagnosis: Ulcer of right fifth metatarsal head Post-operative diagnosis:  Same Surgeon:  Luanna Salk. Randie Heinz, MD Procedure Performed: Transmetatarsal amputation right small toe  Indications: 45 year old female with history of aortobifemoral bypass.  She now has a palpable dorsalis pedis pulse on the right.  She has an ulcer overlying the fifth metatarsal head lateral to the fifth toe appears to be tracking to bone.  She is indicated for toe amputation.  Findings: There was a sinus tract draining directly to the bone.  We removed the fifth toe all tissue appeared healthy and reapproximated without tension.   Procedure:  The patient was identified in the holding area and taken to the operating where she is placed supine operative table.  She was sterilely prepped draped in the right for the usual fashion, antibiotics were administered and a timeout was called.  A preoperative block of been placed.  This was checked and noted to be intact.  I made incision straight down to the fifth metatarsal where there was a sinus tract draining.  I elected to remove the metatarsal.  The incision was carried around the fifth toe.  The bone was transected with bone cutter.  This was smoothed with rasp.  We thoroughly irrigated the wound.  We obtain hemostasis with cautery.  We reapproximated the skin with interrupted 3-0 nylon.  Sterile dressing was placed.  She was awakened from anesthesia having tolerated procedure well without any complication.  All counts were correct at completion.  EBL: 10 cc    Chizaram Latino C. Randie Heinz, MD [facility] Office: [phone] Pager: [phone]

## 2020-11-05 NOTE — Interval H&P Note (Signed)
History and Physical Interval Note:  11/05/2020 12:10 PM  Jessica [name]  has presented today for surgery, with the diagnosis of non healing toe wound.  The various methods of treatment have been discussed with the patient and family. After consideration of risks, benefits and other options for treatment, the patient has consented to  Procedure(s): RIGHT SMALL TOE AMPUTATION (Right) as a surgical intervention.  The patient's history has been reviewed, patient examined, no change in status, stable for surgery.  I have reviewed the patient's chart and labs.  Questions were answered to the patient's satisfaction.     [name]

## 2020-11-06 ENCOUNTER — Telehealth: Payer: Self-pay | Admitting: *Deleted

## 2020-11-06 ENCOUNTER — Encounter (HOSPITAL_COMMUNITY): Payer: Self-pay | Admitting: Vascular Surgery

## 2020-11-06 NOTE — Anesthesia Postprocedure Evaluation (Signed)
Anesthesia Post Note  Patient: Jessica [name]  Procedure(s) Performed: RIGHT SMALL TOE AMPUTATION (Right Toe)     Patient location during evaluation: PACU Anesthesia Type: Regional and MAC Level of consciousness: awake and alert Pain management: pain level controlled Vital Signs Assessment: post-procedure vital signs reviewed and stable Respiratory status: spontaneous breathing, nonlabored ventilation, respiratory function stable and patient connected to nasal cannula oxygen Cardiovascular status: stable and blood pressure returned to baseline Postop Assessment: no apparent nausea or vomiting Anesthetic complications: no   No complications documented.  Last Vitals:  Vitals:   11/05/20 1540 11/05/20 1555  BP: (!) 151/90 (!) 156/83  Pulse: 79 75  Resp: 15 14  Temp:  36.5 C  SpO2: 100% 100%    Last Pain:  Vitals:   11/05/20 1540  TempSrc:   PainSc: 0-No pain                 Caelan Branden

## 2020-11-06 NOTE — Telephone Encounter (Signed)
Patient called and stated that her Pharmacy would not fill the script for Oxycodone IR 5 mg sent in yesterday by Emilie Rutter PA-C because she is already on this medication for chronic pain. Spoke with pharmacy and let them know she is post surgery and would need this medication filled since she would need be taking additional doses. Pharmacy will fill medication. Called and let patient know it is being filled.

## 2020-11-20 DIAGNOSIS — G894 Chronic pain syndrome: Secondary | ICD-10-CM | POA: Insufficient documentation

## 2020-11-20 DIAGNOSIS — F331 Major depressive disorder, recurrent, moderate: Secondary | ICD-10-CM | POA: Insufficient documentation

## 2020-11-21 ENCOUNTER — Other Ambulatory Visit: Payer: Self-pay

## 2020-11-21 ENCOUNTER — Ambulatory Visit (INDEPENDENT_AMBULATORY_CARE_PROVIDER_SITE_OTHER): Payer: Medicare (Managed Care) | Admitting: Physician Assistant

## 2020-11-21 VITALS — BP 131/85 | HR 87 | Temp 98.0°F | Resp 20 | Ht 66.0 in | Wt 130.4 lb

## 2020-11-21 DIAGNOSIS — I739 Peripheral vascular disease, unspecified: Secondary | ICD-10-CM

## 2020-11-21 NOTE — Progress Notes (Signed)
POST OPERATIVE OFFICE NOTE    CC:  F/u for surgery  HPI:  This is a 45 y.o. 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 Donzetta Matters.  She has an ulcer overlying the fifth metatarsal head lateral to the fifth toe appears to be tracking to bone.  She is indicated for toe amputation.   s/p right fifth toe amputation  on 11/05/20 by Dr. Donzetta Matters.    Pt returns today for follow up.  Pt states she is doing well without complaints other than wearing the Darco shoe.  Her wounds have healed and her right foot feels much better.  She denise fever and chills.  No erythema or drainage.  Allergies  Allergen Reactions  . Erythromycin Anaphylaxis, Hives and Nausea Only  . Levofloxacin Other (See Comments)    Burning during administration IV prior to surgery.Pt was told not to take it again.  . Niaspan [Niacin] Anaphylaxis  . Other Anaphylaxis and Other (See Comments)    Derma Bond severe rash  . Penicillins Anaphylaxis    Has patient had a PCN reaction causing immediate rash, facial/tongue/throat swelling, SOB or lightheadedness with hypotension: Yes Has patient had a PCN reaction causing severe rash involving mucus membranes or skin necrosis: No Has patient had a PCN reaction that required hospitalization: No Has patient had a PCN reaction occurring within the last 10 years: No If all of the above answers are "NO", then may proceed with Cephalosporin use.   . Shellfish Allergy Anaphylaxis  . Sulfa Antibiotics Anaphylaxis    "Blisters in my throat"  . Wellbutrin [Bupropion] Other (See Comments)    Suicidal thoughts while on Lexapro  . Morphine Other (See Comments)    ineffective  . Strawberry Extract Hives  . Sulfamethoxazole Other (See Comments)    Blister in throat, sores in mouth  . Tape Other (See Comments)    Nicoderm Adhesive Patch cause blisters   . Cefdinir     Unknown reaction   . Clindamycin/Lincomycin Other (See Comments)    Caused C-Diff  . Doxycycline Hives and  Nausea Only    A 196m tablet formulation caused hives, nausea, vomiting. Can take 50 mg  . Keflex [Cephalexin] Other (See Comments)    Hallucinations  . Statins Other (See Comments)    Muscle pain  . Vancomycin Hives and Nausea Only  . Zetia [Ezetimibe] Diarrhea  . 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 ALPRAZolam (XANAX) 0.5 MG tablet Take 1 tablet (0.5 mg total) by mouth at bedtime as needed for anxiety. 30 tablet 2  . clopidogrel (PLAVIX) 75 MG tablet Take 1 tablet (75 mg total) by mouth daily. (Patient taking differently: Take 75 mg by mouth in the morning.) 90 tablet 3  . EPIPEN 2-PAK 0.3 MG/0.3ML SOAJ injection Inject 0.3 mg into the skin daily as needed for anaphylaxis (allergic reaction).    . Evolocumab 140 MG/ML SOSY Inject 140 mg into the skin every 14 (fourteen) days. Repatha    . glucose blood (PRECISION QID TEST) test strip 1 strip by external route three times daily One touch verio    . insulin lispro (HUMALOG) 100 UNIT/ML injection continuous. INSULIN PUMP    . levothyroxine (SYNTHROID) 50 MCG tablet Take 50 mcg by mouth See admin instructions. Take 1 tablet (50 mcg) by mouth on Mondays, Tuesdays, Wednesdays,  Thursdays, Fridays & Saturdays    . levothyroxine (SYNTHROID) 75 MCG tablet Take 75 mcg by mouth every Sunday.    . lisdexamfetamine (VYVANSE) 10 MG capsule Take 1 capsule (10 mg total) by mouth daily before breakfast. 30 capsule 0  . [START ON 12/04/2020] lisdexamfetamine (VYVANSE) 10 MG capsule Take 1 capsule (10 mg total) by mouth daily before breakfast. 30 capsule 0  . [START ON 01/03/2021] lisdexamfetamine (VYVANSE) 10 MG capsule Take 1 capsule (10 mg total) by mouth daily before breakfast. 30 capsule 0  . Multiple Vitamin (MULTIVITAMIN WITH MINERALS) TABS tablet Take 2 tablets by mouth daily. DEKA    . NARCAN 4  MG/0.1ML LIQD nasal spray kit Place 1 spray into the nose once as needed for opioid reversal.    . Nutritional Supplements (VIVONEX RTF) LIQD Take 1,250-1,750 mLs by mouth at bedtime. Additional at lunch    . ONETOUCH VERIO test strip 3 (three) times daily.    [name] oxyCODONE (OXY IR/ROXICODONE) 5 MG immediate release tablet Take 1 tablet (5 mg total) by mouth in the morning, at noon, and at bedtime. 20 tablet 0  . Pancrelipase, Lip-Prot-Amyl, (PERTZYE) 88325-49826 units CPEP Take 1 tablet by mouth 3 (three) times daily as needed (with real meals/snack (nutrition not by feeding tube)). Only when eat or snack    . pantoprazole (PROTONIX) 40 MG tablet Take 80 mg by mouth at bedtime.     . promethazine (PHENERGAN) 12.5 MG tablet Take 1 tablet (12.5 mg total) by mouth every 6 (six) hours as needed for nausea or vomiting. (Patient not taking: Reported on 10/31/2020) 30 tablet 0  . senna (SENOKOT) 8.6 MG TABS tablet Take 1 tablet by mouth daily as needed for mild constipation.    . Vitamin D, Ergocalciferol, (DRISDOL) 1.25 MG (50000 UNIT) CAPS capsule Take 50,000 Units by mouth every Monday, Wednesday, and Friday.     No current facility-administered medications for this visit.     ROS:  See HPI  Physical Exam:    Incision:  Well healed right fifth toe amputation site. Extremities:  Palpable DP pulse, very minimal edema, no erythema or drainage.  No signs of infection. The sutures were removed and patient tolerated this well.   Assessment/Plan:  This is a 45 y.o. 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 Donzetta Matters.  Followed by fifth toe amputation on the right for ischemia.    She may start wearing a soft shoe and gradually increase her activities.  If her swelling continues over the next few months she will call and we can fit her with mild compression knee.  Otherwise she will follow up in Nov. For ABI's and exam.   Roxy Horseman PA-C [facility] [phone]   Clinic MD:  Donzetta Matters

## 2020-11-25 ENCOUNTER — Other Ambulatory Visit: Payer: Self-pay

## 2020-11-25 DIAGNOSIS — I739 Peripheral vascular disease, unspecified: Secondary | ICD-10-CM

## 2020-12-04 ENCOUNTER — Telehealth: Payer: Self-pay

## 2020-12-04 NOTE — Telephone Encounter (Signed)
Patient calls today to report that she slipped on Saturday and injured her ankle. Since then, it has been swollen. She went to the ED in [place] last night since Urgent Care was unable to see her just to verify everything was okay. X-ray showed no broken bones.They suggested it may be a blood clot since she had swelling and her d-dimer was slightly elevated and she should return to the ED for an ultrasound. Advised patient that since the swelling was localized at her ankle and she had a known ankle injury and d-dimers can be elevated for reasons other than DVT - she should continue the treatment of rest, ice, compression and elevation. She is comfortable with this plan and will call back if she fails to improve or has further issues.

## 2021-01-07 ENCOUNTER — Telehealth: Payer: Self-pay

## 2021-01-07 NOTE — Telephone Encounter (Signed)
GI wants patient off Plavix and aspirin for 5 days prior to G tube replace on 7/26. Per MD, this is fine. Left message on patient vm to inform.

## 2021-01-29 NOTE — Progress Notes (Signed)
[place] MD/PA/NP OP Progress Note  02/02/2021 1:25 PM Jessica [name]  MRN:  [id number]  Chief Complaint:  Chief Complaint   Follow-up; Other    HPI:  - She underwent Transmetatarsal amputation right small toe  This is a follow-up appointment for ADHD and insomnia.  She states that she has not been able to obtain Vyvanse despite contacting the office /pharmacy (this clinician has not received any notification, and the order was sent at the last visit).  She has been feeling down as she feels frustrated with having issues with memory.  She is unable to get things done, and she does not have motivation compared to before.  She started to have insomnia as well.  She was doing very well when she was on Vyvanse, and was sleeping well.  However, she is interested in trying another hypnotics as she is concerned of dependence from Xanax.  She is willing to try Sonata at this time.  She continues to contact with her mother.  Her mother has been argumentative and forgetful.  She makes sure to leave the place when she feels stressed taking care of her. She denies feeling depressed.  She enjoyed going to church and pool.  She denies SI.  She feels anxious at times.   Support: 2 sisters Marital status: married Number of children: 0   Visit Diagnosis:    ICD-10-CM   1. Attention deficit hyperactivity disorder (ADHD), unspecified ADHD type  F90.9     2. Insomnia, unspecified type  G47.00       Past Psychiatric History: Please see initial evaluation for full details. I have reviewed the history. No updates at this time.     Past Medical History:  Past Medical History:  Diagnosis Date   ADHD (attention deficit hyperactivity disorder)    Anxiety    Arthritis    mid back   Diabetes mellitus type I ([place])    Diverticulosis of colon without hemorrhage    DM (diabetes mellitus) ([place]) 11/2013   post pancreatectomy - insulin pump - humalog   Feeding by G-tube ([place])    GERD (gastroesophageal reflux  disease)    Hashimoto's thyroiditis    euthyroid   Hypercholesterolemia    Hypothyroidism    PONV (postoperative nausea and vomiting)     Past Surgical History:  Procedure Laterality Date   ABDOMINAL AORTOGRAM W/LOWER EXTREMITY N/A 04/27/2017   Procedure: ABDOMINAL AORTOGRAM W/LOWER EXTREMITY;  Surgeon: Waynetta Sandy, MD;  Location: [facility];  Service: Cardiovascular;  Laterality: N/A;   ABDOMINAL AORTOGRAM W/LOWER EXTREMITY Bilateral 06/04/2019   Procedure: ABDOMINAL AORTOGRAM W/LOWER EXTREMITY;  Surgeon: Waynetta Sandy, MD;  Location: [facility];  Service: Cardiovascular;  Laterality: Bilateral;   ABDOMINAL AORTOGRAM W/LOWER EXTREMITY N/A 11/26/2019   Procedure: ABDOMINAL AORTOGRAM W/ Right LOWER EXTREMITY [place];  Surgeon: Waynetta Sandy, MD;  Location: [facility];  Service: Cardiovascular;  Laterality: N/A;   ABDOMINAL AORTOGRAM W/LOWER EXTREMITY N/A 07/28/2020   Procedure: ABDOMINAL AORTOGRAM W/LOWER EXTREMITY;  Surgeon: Waynetta Sandy, MD;  Location: [facility];  Service: Cardiovascular;  Laterality: N/A;   ABDOMINAL HYSTERECTOMY     AMPUTATION Right 11/05/2020   Procedure: RIGHT SMALL TOE AMPUTATION;  Surgeon: Waynetta Sandy, MD;  Location: [facility];  Service: Vascular;  Laterality: Right;   AORTA - BILATERAL FEMORAL ARTERY BYPASS GRAFT Bilateral 08/07/2020   Procedure: AORTA BIFEMORAL BYPASS GRAFTING;  Surgeon: Waynetta Sandy, MD;  Location: [facility];  Service: Vascular;  Laterality: Bilateral;   BIOPSY N/A 05/15/2014   Procedure: GASTIC,  ASCENDING, AND DESCEDNING/SIGMOID  COLON BIOPSY;  Surgeon: Daneil Dolin, MD;  Location: [facility];  Service: Endoscopy;  Laterality: N/A;   CHOLECYSTECTOMY     COLONOSCOPY WITH PROPOFOL N/A 05/15/2014   Dr. Gala Romney (primary GI is Dr. Oneida Alar). Hemorrhoids, negative random colon biopsies. GI pathogen panel negative. Diverticulosis.   complete hysterectomy      ESOPHAGOGASTRODUODENOSCOPY (EGD) WITH PROPOFOL N/A 05/15/2014   Dr. Gala Romney (primary GI Dr. 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   KNEE SURGERY Right    X 6-5 arthroscopies and open procedure 1 time- tighten patellar tendon and loose IT band   PANCREATECTOMY  11/2013   with splenectomy/hepaticojejunostomy and gastrojejunostomy.   PERIPHERAL VASCULAR INTERVENTION Right 06/04/2019   Procedure: PERIPHERAL VASCULAR INTERVENTION;  Surgeon: Waynetta Sandy, MD;  Location: [facility];  Service: Cardiovascular;  Laterality: Right;  common iliac   PERIPHERAL VASCULAR INTERVENTION Right 11/26/2019   Procedure: PERIPHERAL VASCULAR INTERVENTION;  Surgeon: Waynetta Sandy, MD;  Location: [facility];  Service: Cardiovascular;  Laterality: Right;  Iliac     Family Psychiatric History: Please see initial evaluation for full details. I have reviewed the history. No updates at this time.     Family History:  Family History  Problem Relation Age of Onset   Pancreatic cancer Paternal Grandfather    Cirrhosis Paternal Grandfather    Heart attack Paternal Grandfather    Pancreatitis Other        cousin   Throat cancer Mother    Peripheral Artery Disease Mother    Depression Father    Pancreatitis Father    Colon cancer Neg Hx     Social History:  Social History   Socioeconomic History   Marital status: Married    Spouse name: Not on file   Number of children: 0   Years of education: Not on file   Highest education level: Not on file  Occupational History   Occupation: disabled  Tobacco Use   Smoking status: Former    Packs/day: 2.00    Years: 30.00    Pack years: 60.00    Types: Cigarettes    Quit date: 04/21/2020    Years since quitting: 0.7   Smokeless tobacco: Never   Tobacco comments:    1 cig daily  Vaping Use   Vaping Use: Some days   Substance and Sexual Activity   Alcohol use: No    Alcohol/week: 0.0 standard drinks   Drug use: No   Sexual activity: Yes    Birth control/protection: None    Comment: Hysterectomy  Other Topics Concern   Not on file  Social History Narrative   Not on file   Social Determinants of Health   Financial Resource Strain: Not on file  Food Insecurity: Not on file  Transportation Needs: Not on file  Physical Activity: Not on file  Stress: Not on file  Social Connections: Not on file    Allergies:  Allergies  Allergen Reactions   Erythromycin Anaphylaxis, Hives and Nausea Only   Levofloxacin Other (See Comments)    Burning during administration IV prior to surgery.  Pt was told not to take it again.   Niaspan [Niacin] Anaphylaxis   Other Anaphylaxis and Other (See Comments)    Derma Bond severe rash  Penicillins Anaphylaxis    Has patient had a PCN reaction causing immediate rash, facial/tongue/throat swelling, SOB or lightheadedness with hypotension: Yes Has patient had a PCN reaction causing severe rash involving mucus membranes or skin necrosis: No Has patient had a PCN reaction that required hospitalization: No Has patient had a PCN reaction occurring within the last 10 years: No If all of the above answers are "NO", then may proceed with Cephalosporin use.    Shellfish Allergy Anaphylaxis   Sulfa Antibiotics Anaphylaxis    "Blisters in my throat"   Wellbutrin [Bupropion] Other (See Comments)    Suicidal thoughts while on Lexapro   Morphine Other (See Comments)    ineffective   Strawberry Extract Hives   Sulfamethoxazole Other (See Comments)    Blister in throat, sores in mouth   Tape Other (See Comments)    Nicoderm Adhesive Patch cause blisters    Cefdinir     Unknown reaction    Clindamycin/Lincomycin Other (See Comments)    Caused C-Diff   Doxycycline Hives and Nausea Only    A 135m tablet formulation caused hives, nausea, vomiting. 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 30 capsule 0   [START ON 03/04/2021] lisdexamfetamine (VYVANSE) 10 MG capsule Take 1 capsule (10 mg total) by mouth daily before breakfast. 30 capsule 0   zaleplon (SONATA) 5 MG capsule Take 1 capsule (5 mg total) by mouth at bedtime as needed for sleep. 30 capsule 1   clopidogrel (PLAVIX) 75 MG tablet Take 1 tablet (75 mg total) by mouth daily. (Patient taking differently: Take 75 mg by mouth in the morning.) 90 tablet 3   EPIPEN 2-PAK 0.3 MG/0.3ML SOAJ injection Inject 0.3 mg into the skin daily as needed for anaphylaxis (allergic reaction).     Evolocumab 140 MG/ML SOSY Inject 140 mg into the skin every 14 (fourteen) days. Repatha     glucose blood (PRECISION QID TEST) test strip 1 strip by external route three times daily One touch verio     insulin lispro (HUMALOG) 100 UNIT/ML injection continuous. INSULIN PUMP     levothyroxine (SYNTHROID) 50 MCG tablet Take 50 mcg by mouth See admin instructions. Take 1 tablet (50 mcg) by mouth on Mondays, Tuesdays, Wednesdays, Thursdays, Fridays & Saturdays      levothyroxine (SYNTHROID) 75 MCG tablet Take 75 mcg by mouth every Sunday.     lisdexamfetamine (VYVANSE) 10 MG capsule Take 1 capsule (10 mg total) by mouth daily before breakfast. 30 capsule 0   lisdexamfetamine (VYVANSE) 10 MG capsule Take 1 capsule (10  mg total) by mouth daily before breakfast. 30 capsule 0   Multiple Vitamin (MULTIVITAMIN WITH MINERALS) TABS tablet Take 2 tablets by mouth daily. DEKA     NARCAN 4 MG/0.1ML LIQD nasal spray kit Place 1 spray into the nose once as needed for opioid reversal.     Nutritional Supplements (VIVONEX RTF) LIQD Take 1,250-1,750 mLs by mouth at bedtime. Additional at lunch     [facility] VERIO test strip 3 (three) times daily.     oxyCODONE (OXY IR/ROXICODONE) 5 MG immediate release tablet Take 1 tablet (5 mg total) by mouth in the morning, at noon, and at bedtime. 20 tablet 0   Pancrelipase, Lip-Prot-Amyl, (PERTZYE) 38937-34287 units CPEP Take 1 tablet by mouth 3 (three) times daily as needed (with real meals/snack (nutrition not by feeding tube)). Only when eat or snack     pantoprazole (PROTONIX) 40 MG tablet Take 80 mg by mouth at bedtime.      promethazine (PHENERGAN) 12.5 MG tablet Take 1 tablet (12.5 mg total) by mouth every 6 (six) hours as needed for nausea or vomiting. 30 tablet 0   senna (SENOKOT) 8.6 MG TABS tablet Take 1 tablet by mouth daily as needed for mild constipation.     Vitamin D, Ergocalciferol, (DRISDOL) 1.25 MG (50000 UNIT) CAPS capsule Take 50,000 Units by mouth every Monday, Wednesday, and Friday.     No current facility-administered medications for this visit.     Musculoskeletal: Strength & Muscle Tone:  N/A Gait & Station:  N/A Patient leans: N/A  Psychiatric Specialty Exam: Review of Systems  Psychiatric/Behavioral:  Positive for decreased concentration and sleep disturbance. Negative for agitation, behavioral problems, confusion, dysphoric mood, hallucinations, self-injury and suicidal ideas. The patient is  nervous/anxious. The patient is not hyperactive.   All other systems reviewed and are negative.  There were no vitals taken for this visit.There is no height or weight on file to calculate BMI.  General Appearance: Fairly Groomed  Eye Contact:  Good  Speech:  Clear and Coherent  Volume:  Normal  Mood:   not good  Affect:  Appropriate, Congruent, and euthymic, reactive  Thought Process:  Coherent  Orientation:  Full (Time, Place, and Person)  Thought Content: Logical   Suicidal Thoughts:  No  Homicidal Thoughts:  No  Memory:  Immediate;   Good  Judgement:  Good  Insight:  Good  Psychomotor Activity:  Normal  Concentration:  Concentration: Fair and Attention Span: Fair  Recall:  Good  Fund of Knowledge: Good  Language: Good  Akathisia:  No  Handed:  Right  AIMS (if indicated): not done  Assets:  Communication Skills Desire for Improvement  ADL's:  Intact  Cognition: WNL  Sleep:  Poor   Screenings: PHQ2-9    Flowsheet Row Video Visit from 11/04/2020 in [facility] Video Visit from 09/02/2020 in [facility]  PHQ-2 Total Score 1 0      [facility] Admission (Discharged) from 11/05/2020 in [facility] Video Visit from 11/04/2020 in [facility] Video Visit from 10/07/2020 in [facility] No Risk No Risk No Risk        Assessment and Plan:  Jessica [name] is a 45 y.o. year old female with a history of ADHD, depression,  hashimoto's thyroiditis, AIH/Hereditary pancreatitis s/p islet cell transplant, diabetes, s/p Roux-en- Y bypass surgery, left lower extremity bypass with a subsequent balloon angioplasty, who presents for follow up  appointment for below.   1. Attention deficit hyperactivity disorder (ADHD), unspecified ADHD type She reports significant worsening in ADHD symptoms since she has not been able to get Vyvanse from  the pharmacy.  Will restart Vyvanse to target ADHD.   2. Insomnia, unspecified type She reports worsening in and insomnia since the last visit.  She is now willing to try other hypnotics to target insomnia as she is concerned of dependence from Xanax.  Will try Sonata as needed for insomnia.  Discussed potential risk of drowsiness.    # Adjustment disorder with depressed mood Unchanged.  Although she reports occasional anxiety in the context of her mother having stage IV lung cancer, she has been handling things relatively well.  Continue to monitor without psychotropics.    This clinician has discussed the side effect associated with medication prescribed during this encounter. Please refer to notes in the previous encounters for more details.    Plan 1. Continue Vyvanse 10 mg daily  2.  Discontinue Xanax (used to be on 0.5 mg at night as needed for anxiety) 3. Next appointment: 10/10 at 10 AM for 30 mins, in person visit   I have utilized the [place] Controlled Substances Reporting System (PMP AWARxE) to confirm adherence regarding the patient's medication. My review reveals appropriate prescription fills.     Past trials of medication: sertraline, fluoxetine, Paxil, citalopram, lexapro, bupropion (voices), venlafaxine, duloxetine, trintellix, mirtazapine, trazodone, amitriptyline, doxepin (insomnia), lamotrigine, Geodon, olanzapine, quetiapine, Vraylar, xanax, clonazepam, temazepam, ritalin, trazodone, Ambien, Rozerem, Lunesta, Belsomra, benadryl, Concerta (palpitation at 36 mg)   The patient demonstrates the following risk factors for suicide: Chronic risk factors for suicide include: psychiatric disorder of depression and medical illness pancreatitis. Acute risk factors for suicide include: unemployment. Protective factors for this patient include: positive social support, coping skills and hope for the future. Considering these factors, the overall suicide risk at this point appears to be low.  Patient is appropriate for outpatient follow up.      Norman Clay, MD 02/02/2021, 1:25 PM

## 2021-02-02 ENCOUNTER — Encounter: Payer: Self-pay | Admitting: Psychiatry

## 2021-02-02 ENCOUNTER — Other Ambulatory Visit: Payer: Self-pay

## 2021-02-02 ENCOUNTER — Telehealth (INDEPENDENT_AMBULATORY_CARE_PROVIDER_SITE_OTHER): Payer: Medicare (Managed Care) | Admitting: Psychiatry

## 2021-02-02 DIAGNOSIS — G4700 Insomnia, unspecified: Secondary | ICD-10-CM | POA: Diagnosis not present

## 2021-02-02 DIAGNOSIS — F909 Attention-deficit hyperactivity disorder, unspecified type: Secondary | ICD-10-CM

## 2021-02-02 MED ORDER — ZALEPLON 5 MG PO CAPS: 5.0000 mg | ORAL_CAPSULE | Freq: Every evening | ORAL | 1 refills | Status: DC | PRN

## 2021-02-02 MED ORDER — LISDEXAMFETAMINE DIMESYLATE 10 MG PO CAPS: 10.0000 mg | ORAL_CAPSULE | Freq: Every day | ORAL | 0 refills | Status: DC

## 2021-02-02 NOTE — Patient Instructions (Signed)
1. Continue Vyvanse 10 mg daily  2.  Discontinue Xanax  3. Next appointment: 10/10 at 10 AM   The next visit will be in person visit. Please arrive 15 mins before the scheduled time.   [facility]  Address: [address], [place], [place] [address]    Please call the following numbers if needed To schedule/cancel an appointment: [phone] To speak with nurse (about medication, forms or other concerns): [phone]

## 2021-02-26 ENCOUNTER — Telehealth: Payer: Medicare (Managed Care)

## 2021-02-26 NOTE — Telephone Encounter (Signed)
fax notice that xanax has been discontinued.

## 2021-02-26 NOTE — Telephone Encounter (Signed)
pain clinic faxed and confirm that pt xanax has been dismissed and that the pharmacy has been notiifed

## 2021-02-26 NOTE — Telephone Encounter (Signed)
pt wanted a letter sent to her pain clinic that the xanax has been dismissed and pharmacy notified that medication had been dismissed.

## 2021-03-16 ENCOUNTER — Telehealth: Payer: Self-pay

## 2021-03-16 NOTE — Telephone Encounter (Signed)
left a message of rpatient to call office back to get more details.

## 2021-03-16 NOTE — Telephone Encounter (Signed)
pt called and left a message that she stopped taking the vyvanse states that she felt off but didn't say anything else.

## 2021-03-25 NOTE — Progress Notes (Signed)
Virtual Visit via Video Note  I connected with Jessica [name] on 03/30/21 at 10:00 AM EDT by a video enabled telemedicine application and verified that I am speaking with the correct person using two identifiers.  Location: Patient: car Provider: office Persons participated in the visit- patient, provider    I discussed the limitations of evaluation and management by telemedicine and the availability of in person appointments. The patient expressed understanding and agreed to proceed.    I discussed the assessment and treatment plan with the patient. The patient was provided an opportunity to ask questions and all were answered. The patient agreed with the plan and demonstrated an understanding of the instructions.   The patient was advised to call back or seek an in-person evaluation if the symptoms worsen or if the condition fails to improve as anticipated.  I provided 14 minutes of non-face-to-face time during this encounter.   Norman Clay, MD    [facility] MD/PA/NP OP Progress Note  03/30/2021 10:29 AM [name]  MRN:  [id number]  Chief Complaint:  Chief Complaint   ADHD; Follow-up    HPI:  This is a follow-up appointment for ADHD and insomnia.  She states that she discontinued Vyvanse about a month ago.  She was have palpitation and worsening in panic attacks.  It subsided since she discontinued this medication.  However, she is more struggling with focus.  She feels more anxious due to this.  Although she has started work at [employer], she has had difficulty as she is easily distracted.  She sees her mother regularly.  There is a progress in her disease.  She has low energy.  She did not find any benefit from [place].  Her pain specialist does not want her to be on Xanax as she is now taking higher dose of oxycodone.  She notices that she has been sleeping better, although it is not optimal.  She denies feeling depressed.  She denies anhedonia, and enjoys motorcycle.  She  has good appetite.  She denies SI.  She is willing to try Ritalin again.   Support: 2 sisters Employed: [employer] Marital status: married Number of children: 0    Visit Diagnosis:    ICD-10-CM   1. Attention deficit hyperactivity disorder (ADHD), unspecified ADHD type  F90.9     2. Insomnia, unspecified type  G47.00       Past Psychiatric History: Please see initial evaluation for full details. I have reviewed the history. No updates at this time.     Past Medical History:  Past Medical History:  Diagnosis Date   ADHD (attention deficit hyperactivity disorder)    Anxiety    Arthritis    mid back   Diabetes mellitus type I ([place])    Diverticulosis of colon without hemorrhage    DM (diabetes mellitus) ([place]) 11/2013   post pancreatectomy - insulin pump - humalog   Feeding by G-tube (Scissors)    GERD (gastroesophageal reflux disease)    Hashimoto's thyroiditis    euthyroid   Hypercholesterolemia    Hypothyroidism    PONV (postoperative nausea and vomiting)     Past Surgical History:  Procedure Laterality Date   ABDOMINAL AORTOGRAM W/LOWER EXTREMITY N/A 04/27/2017   Procedure: ABDOMINAL AORTOGRAM W/LOWER EXTREMITY;  Surgeon: Waynetta Sandy, MD;  Location: [facility];  Service: Cardiovascular;  Laterality: N/A;   ABDOMINAL AORTOGRAM W/LOWER EXTREMITY Bilateral 06/04/2019   Procedure: ABDOMINAL AORTOGRAM W/LOWER EXTREMITY;  Surgeon: Waynetta Sandy, MD;  Location: [facility];  Service: Cardiovascular;  Laterality: Bilateral;   ABDOMINAL AORTOGRAM W/LOWER EXTREMITY N/A 11/26/2019   Procedure: ABDOMINAL AORTOGRAM W/ Right LOWER EXTREMITY [place];  Surgeon: Waynetta Sandy, MD;  Location: [facility];  Service: Cardiovascular;  Laterality: N/A;   ABDOMINAL AORTOGRAM W/LOWER EXTREMITY N/A 07/28/2020   Procedure: ABDOMINAL AORTOGRAM W/LOWER EXTREMITY;  Surgeon: Waynetta Sandy, MD;  Location: [facility];  Service:  Cardiovascular;  Laterality: N/A;   ABDOMINAL HYSTERECTOMY     AMPUTATION Right 11/05/2020   Procedure: RIGHT SMALL TOE AMPUTATION;  Surgeon: Waynetta Sandy, MD;  Location: [facility];  Service: Vascular;  Laterality: Right;   AORTA - BILATERAL FEMORAL ARTERY BYPASS GRAFT Bilateral 08/07/2020   Procedure: AORTA BIFEMORAL BYPASS GRAFTING;  Surgeon: Waynetta Sandy, MD;  Location: [facility];  Service: Vascular;  Laterality: Bilateral;   BIOPSY N/A 05/15/2014   Procedure: GASTIC,  ASCENDING, AND DESCEDNING/SIGMOID  COLON BIOPSY;  Surgeon: Daneil Dolin, MD;  Location: [facility];  Service: Endoscopy;  Laterality: N/A;   CHOLECYSTECTOMY     COLONOSCOPY WITH PROPOFOL N/A 05/15/2014   Dr. Gala Romney (primary GI is Dr. Oneida Alar). Hemorrhoids, negative random colon biopsies. GI pathogen panel negative. Diverticulosis.   complete hysterectomy     ESOPHAGOGASTRODUODENOSCOPY (EGD) WITH PROPOFOL N/A 05/15/2014   Dr. Gala Romney (primary GI Dr. 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   KNEE SURGERY Right    X 6-5 arthroscopies and open procedure 1 time- tighten patellar tendon and loose IT band   PANCREATECTOMY  11/2013   with splenectomy/hepaticojejunostomy and gastrojejunostomy.   PERIPHERAL VASCULAR INTERVENTION Right 06/04/2019   Procedure: PERIPHERAL VASCULAR INTERVENTION;  Surgeon: Waynetta Sandy, MD;  Location: [facility];  Service: Cardiovascular;  Laterality: Right;  common iliac   PERIPHERAL VASCULAR INTERVENTION Right 11/26/2019   Procedure: PERIPHERAL VASCULAR INTERVENTION;  Surgeon: Waynetta Sandy, MD;  Location: [facility];  Service: Cardiovascular;  Laterality: Right;  Iliac     Family Psychiatric History: Please see initial evaluation for full details. I have reviewed the history. No updates at this time.     Family History:  Family History   Problem Relation Age of Onset   Pancreatic cancer Paternal Grandfather    Cirrhosis Paternal Grandfather    Heart attack Paternal Grandfather    Pancreatitis Other        cousin   Throat cancer Mother    Peripheral Artery Disease Mother    Depression Father    Pancreatitis Father    Colon cancer Neg Hx     Social History:  Social History   Socioeconomic History   Marital status: Married    Spouse name: Not on file   Number of children: 0   Years of education: Not on file   Highest education level: Not on file  Occupational History   Occupation: disabled  Tobacco Use   Smoking status: Former    Packs/day: 2.00    Years: 30.00    Pack years: 60.00    Types: Cigarettes    Quit date: 04/21/2020    Years since quitting: 0.9   Smokeless tobacco: Never   Tobacco comments:    1 cig daily  Vaping Use   Vaping Use: Some days  Substance and Sexual Activity   Alcohol use: No    Alcohol/week: 0.0 standard drinks  Drug use: No   Sexual activity: Yes    Birth control/protection: None    Comment: Hysterectomy  Other Topics Concern   Not on file  Social History Narrative   Not on file   Social Determinants of Health   Financial Resource Strain: Not on file  Food Insecurity: Not on file  Transportation Needs: Not on file  Physical Activity: Not on file  Stress: Not on file  Social Connections: Not on file    Allergies:  Allergies  Allergen Reactions   Erythromycin Anaphylaxis, Hives and Nausea Only   Levofloxacin Other (See Comments)    Burning during administration IV prior to surgery.  Pt was told not to take it again.   Niaspan [Niacin] Anaphylaxis   Other Anaphylaxis and Other (See Comments)    Derma Bond severe rash   Penicillins Anaphylaxis    Has patient had a PCN reaction causing immediate rash, facial/tongue/throat swelling, SOB or lightheadedness with hypotension: Yes Has patient had a PCN reaction causing severe rash involving mucus membranes or skin  necrosis: No Has patient had a PCN reaction that required hospitalization: No Has patient had a PCN reaction occurring within the last 10 years: No If all of the above answers are "NO", then may proceed with Cephalosporin use.    Shellfish Allergy Anaphylaxis   Sulfa Antibiotics Anaphylaxis    "Blisters in my throat"   Wellbutrin [Bupropion] Other (See Comments)    Suicidal thoughts while on Lexapro   Morphine Other (See Comments)    ineffective   Strawberry Extract Hives   Sulfamethoxazole Other (See Comments)    Blister in throat, sores in mouth   Tape Other (See Comments)    Nicoderm Adhesive Patch cause blisters    Cefdinir     Unknown reaction    Clindamycin/Lincomycin Other (See Comments)    Caused C-Diff   Doxycycline Hives and Nausea Only    A $'100mg'o$  tablet formulation caused hives, nausea, vomiting. 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 60 tablet 0   [START ON 04/29/2021] methylphenidate (RITALIN) 10 MG tablet Take 1 tablet (10 mg total) by mouth 2 (two) times daily. 60 tablet 0   clopidogrel (PLAVIX) 75 MG  tablet Take 1 tablet (75 mg total) by mouth daily. (Patient taking differently: Take 75 mg by mouth in the morning.) 90 tablet 3   EPIPEN 2-PAK 0.3 MG/0.3ML SOAJ injection Inject 0.3 mg into the skin daily as needed for anaphylaxis (allergic reaction).     Evolocumab 140 MG/ML SOSY Inject 140 mg into the skin  every 14 (fourteen) days. Repatha     glucose blood (PRECISION QID TEST) test strip 1 strip by external route three times daily One touch verio     insulin lispro (HUMALOG) 100 UNIT/ML injection continuous. INSULIN PUMP     levothyroxine (SYNTHROID) 50 MCG tablet Take 50 mcg by mouth See admin instructions. Take 1 tablet (50 mcg) by mouth on Mondays, Tuesdays, Wednesdays, Thursdays, Fridays & Saturdays     levothyroxine (SYNTHROID) 75 MCG tablet Take 75 mcg by mouth every Sunday.     Multiple Vitamin (MULTIVITAMIN WITH MINERALS) TABS tablet Take 2 tablets by mouth daily. DEKA     NARCAN 4 MG/0.1ML LIQD nasal spray kit Place 1 spray into the nose once as needed for opioid reversal.     Nutritional Supplements (VIVONEX RTF) LIQD Take 1,250-1,750 mLs by mouth at bedtime. Additional at lunch     [facility] VERIO test strip 3 (three) times daily.     oxyCODONE (OXY IR/ROXICODONE) 5 MG immediate release tablet Take 1 tablet (5 mg total) by mouth in the morning, at noon, and at bedtime. (Patient taking differently: Take 10 mg by mouth in the morning, at noon, and at bedtime.) 20 tablet 0   Pancrelipase, Lip-Prot-Amyl, (PERTZYE) 16000-57500 units CPEP Take 1 tablet by mouth 3 (three) times daily as needed (with real meals/snack (nutrition not by feeding tube)). Only when eat or snack     pantoprazole (PROTONIX) 40 MG tablet Take 80 mg by mouth at bedtime.      promethazine (PHENERGAN) 12.5 MG tablet Take 1 tablet (12.5 mg total) by mouth every 6 (six) hours as needed for nausea or vomiting. 30 tablet 0   senna (SENOKOT) 8.6 MG TABS tablet Take 1 tablet by mouth daily as needed for mild constipation.      Vitamin D, Ergocalciferol, (DRISDOL) 1.25 MG (50000 UNIT) CAPS capsule Take 50,000 Units by mouth every Monday, Wednesday, and Friday.     No current facility-administered medications for this visit.     Musculoskeletal: Strength & Muscle Tone:  N/A Gait & Station:  N/A Patient leans: N/A  Psychiatric Specialty Exam: Review of Systems  Psychiatric/Behavioral:  Positive for decreased concentration and sleep disturbance. Negative for agitation, behavioral problems, confusion, dysphoric mood, hallucinations, self-injury and suicidal ideas. The patient is nervous/anxious. The patient is not hyperactive.   All other systems reviewed and are negative.  There were no vitals taken for this visit.There is no height or weight on file to calculate BMI.  General Appearance: Fairly Groomed  Eye Contact:  Good  Speech:  Clear and Coherent  Volume:  Normal  Mood:   anxious  Affect:  Appropriate, Congruent, and calm  Thought Process:  Coherent  Orientation:  Full (Time, Place, and Person)  Thought Content: Logical   Suicidal Thoughts:  No  Homicidal Thoughts:  No  Memory:  Immediate;   Good  Judgement:  Good  Insight:  Good  Psychomotor Activity:  Normal  Concentration:  Concentration: Good and Attention Span: Good  Recall:  Good  Fund of Knowledge: Good  Language: Good  Akathisia:  No  Handed:  Right  AIMS (if indicated): not done  Assets:  Communication Skills Desire for Improvement  ADL's:  Intact  Cognition: WNL  Sleep:  Poor   Screenings: PHQ2-9    Flowsheet Row Video Visit from 03/30/2021 in [facility] Video Visit from 11/04/2020 in [facility] Video Visit from 09/02/2020 in [facility]  PHQ-2 Total  Score 0 1 0      Flowsheet Row Video Visit from 03/30/2021 in [facility] Ad[facility] (Discharged) from 11/05/2020 in [facility] Video Visit from  11/04/2020 in [facility] No Risk No Risk No Risk        Assessment and Plan:  Jessica [name] is a 45 y.o. year old female with a history of ADHD, depression,  hashimoto's thyroiditis, AIH/Hereditary pancreatitis s/p islet cell transplant, diabetes, s/p Roux-en- Y bypass surgery, left lower extremity bypass with a subsequent balloon angioplasty, who presents for follow up appointment for below.   1. Attention deficit hyperactivity disorder (ADHD), unspecified ADHD type She discontinued Vyvanse due to palpitation/worsening in anxiety.  She is wanting to try Ritalin again from lower dose to mitigate this side effect.  Discussed potential risks, which includes but not limited to worsening in anxiety, perpetration and insomnia.   2. Insomnia, unspecified type She had limited benefit from [place].  She is now on the higher dose of oxycodone, and the pain specialist does not want her to be on Xanax.  Will discontinue Sonata, and will not try hypnotics at this time given it is more related to pain.    This clinician has discussed the side effect associated with medication prescribed during this encounter. Please refer to notes in the previous encounters for more details.    Plan Discontinue Vyvanse Start Ritalin 10 mg in the a.m., 10 mg at lunchtime Discontinue Sonata Next appointment- 12/5 at 8:30 for 30 mins, in person  Past trials of medication: sertraline, fluoxetine, Paxil, citalopram, lexapro, bupropion (voices), venlafaxine, duloxetine, trintellix, mirtazapine, trazodone, amitriptyline, doxepin (insomnia), lamotrigine, Geodon, olanzapine, quetiapine, Vraylar, xanax, clonazepam, temazepam, ritalin, trazodone, Ambien, Rozerem, Lunesta, Belsomra, benadryl, Concerta (palpitation at 36 mg)   The patient demonstrates the following risk factors for suicide: Chronic risk factors for suicide include: psychiatric disorder of depression and medical  illness pancreatitis. Acute risk factors for suicide include: unemployment. Protective factors for this patient include: positive social support, coping skills and hope for the future. Considering these factors, the overall suicide risk at this point appears to be low. Patient is appropriate for outpatient follow up.  Norman Clay, MD 03/30/2021, 10:29 AM

## 2021-03-30 ENCOUNTER — Other Ambulatory Visit: Payer: Self-pay

## 2021-03-30 ENCOUNTER — Telehealth (INDEPENDENT_AMBULATORY_CARE_PROVIDER_SITE_OTHER): Payer: Medicare (Managed Care) | Admitting: Psychiatry

## 2021-03-30 ENCOUNTER — Encounter: Payer: Self-pay | Admitting: Psychiatry

## 2021-03-30 DIAGNOSIS — F909 Attention-deficit hyperactivity disorder, unspecified type: Secondary | ICD-10-CM

## 2021-03-30 DIAGNOSIS — G4700 Insomnia, unspecified: Secondary | ICD-10-CM | POA: Diagnosis not present

## 2021-03-30 MED ORDER — METHYLPHENIDATE HCL 10 MG PO TABS: 10.0000 mg | ORAL_TABLET | Freq: Two times a day (BID) | ORAL | 0 refills | Status: DC

## 2021-03-30 NOTE — Patient Instructions (Signed)
Discontinue Vyvanse Start Ritalin 10 mg in the a.m., 10 mg at lunchtime Discontinue [place] Next appointment- 12/5 at 8:30

## 2021-04-03 ENCOUNTER — Other Ambulatory Visit: Payer: Self-pay

## 2021-04-03 ENCOUNTER — Telehealth: Payer: Self-pay

## 2021-04-03 DIAGNOSIS — I739 Peripheral vascular disease, unspecified: Secondary | ICD-10-CM

## 2021-04-03 NOTE — Telephone Encounter (Signed)
Patient left vm reporting some cramping and pain in her thighs after walking. She has an appt with [facility] in November, added le art to that appt. Advised patient call back sooner if other symptoms or rest pain developed.

## 2021-04-29 DIAGNOSIS — N952 Postmenopausal atrophic vaginitis: Secondary | ICD-10-CM | POA: Insufficient documentation

## 2021-04-30 ENCOUNTER — Ambulatory Visit (INDEPENDENT_AMBULATORY_CARE_PROVIDER_SITE_OTHER): Payer: Medicare (Managed Care) | Admitting: Physician Assistant

## 2021-04-30 ENCOUNTER — Other Ambulatory Visit: Payer: Self-pay

## 2021-04-30 ENCOUNTER — Ambulatory Visit (HOSPITAL_COMMUNITY)
Admission: RE | Admit: 2021-04-30 | Discharge: 2021-04-30 | Disposition: A | Discharge status: Home / Self Care | Payer: Medicaid - Out of State | Source: Ambulatory Visit | Attending: Vascular Surgery | Admitting: Vascular Surgery

## 2021-04-30 ENCOUNTER — Ambulatory Visit (INDEPENDENT_AMBULATORY_CARE_PROVIDER_SITE_OTHER)
Admission: RE | Admit: 2021-04-30 | Discharge: 2021-04-30 | Disposition: A | Discharge status: Home / Self Care | Payer: Medicaid - Out of State | Source: Ambulatory Visit | Attending: Vascular Surgery | Admitting: Vascular Surgery

## 2021-04-30 ENCOUNTER — Ambulatory Visit: Payer: Medicare (Managed Care)

## 2021-04-30 VITALS — BP 142/94 | HR 79 | Temp 98.3°F | Resp 20 | Ht 66.0 in | Wt 130.8 lb

## 2021-04-30 DIAGNOSIS — I739 Peripheral vascular disease, unspecified: Secondary | ICD-10-CM | POA: Insufficient documentation

## 2021-05-01 ENCOUNTER — Encounter: Payer: Self-pay | Admitting: Physician Assistant

## 2021-05-01 NOTE — Progress Notes (Signed)
Office Note     CC:  follow up Requesting Provider:  Ollen Bowl, MD  HPI: Jessica [name] is a 45 y.o. (Jun 23, 1975) female who presents for routine surveillance of PAD.  She is status post aortobifemoral bypass performed by Dr. Donzetta Matters on 08/07/2020.  She subsequently required right fifth toe amputation by Dr. Donzetta Matters on 11/05/2020.  Toe amputation site is now well-healed.  She denies any claudication, rest pain, or other tissue changes.  She continues to take her Plavix daily.  She denies tobacco use.  Surgical history also significant for left femoral to popliteal bypass in 2017 which was performed in [place].   Past Medical History:  Diagnosis Date   ADHD (attention deficit hyperactivity disorder)    Anxiety    Arthritis    mid back   Diabetes mellitus type I ([place])    Diverticulosis of colon without hemorrhage    DM (diabetes mellitus) ([place]) 11/2013   post pancreatectomy - insulin pump - humalog   Feeding by G-tube ([place])    GERD (gastroesophageal reflux disease)    Hashimoto's thyroiditis    euthyroid   Hypercholesterolemia    Hypothyroidism    PONV (postoperative nausea and vomiting)     Past Surgical History:  Procedure Laterality Date   ABDOMINAL AORTOGRAM W/LOWER EXTREMITY N/A 04/27/2017   Procedure: ABDOMINAL AORTOGRAM W/LOWER EXTREMITY;  Surgeon: Waynetta Sandy, MD;  Location: [facility];  Service: Cardiovascular;  Laterality: N/A;   ABDOMINAL AORTOGRAM W/LOWER EXTREMITY Bilateral 06/04/2019   Procedure: ABDOMINAL AORTOGRAM W/LOWER EXTREMITY;  Surgeon: Waynetta Sandy, MD;  Location: [facility];  Service: Cardiovascular;  Laterality: Bilateral;   ABDOMINAL AORTOGRAM W/LOWER EXTREMITY N/A 11/26/2019   Procedure: ABDOMINAL AORTOGRAM W/ Right LOWER EXTREMITY [place];  Surgeon: Waynetta Sandy, MD;  Location: [facility];  Service: Cardiovascular;  Laterality: N/A;   ABDOMINAL AORTOGRAM W/LOWER EXTREMITY N/A 07/28/2020    Procedure: ABDOMINAL AORTOGRAM W/LOWER EXTREMITY;  Surgeon: Waynetta Sandy, MD;  Location: [facility];  Service: Cardiovascular;  Laterality: N/A;   ABDOMINAL HYSTERECTOMY     AMPUTATION Right 11/05/2020   Procedure: RIGHT SMALL TOE AMPUTATION;  Surgeon: Waynetta Sandy, MD;  Location: [facility];  Service: Vascular;  Laterality: Right;   AORTA - BILATERAL FEMORAL ARTERY BYPASS GRAFT Bilateral 08/07/2020   Procedure: AORTA BIFEMORAL BYPASS GRAFTING;  Surgeon: Waynetta Sandy, MD;  Location: [facility];  Service: Vascular;  Laterality: Bilateral;   BIOPSY N/A 05/15/2014   Procedure: GASTIC,  ASCENDING, AND DESCEDNING/SIGMOID  COLON BIOPSY;  Surgeon: Daneil Dolin, MD;  Location: [facility];  Service: Endoscopy;  Laterality: N/A;   CHOLECYSTECTOMY     COLONOSCOPY WITH PROPOFOL N/A 05/15/2014   Dr. Gala Romney (primary GI is Dr. Oneida Alar). Hemorrhoids, negative random colon biopsies. GI pathogen panel negative. Diverticulosis.   complete hysterectomy     ESOPHAGOGASTRODUODENOSCOPY (EGD) WITH PROPOFOL N/A 05/15/2014   Dr. Gala Romney (primary GI Dr. 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   KNEE SURGERY Right    X 6-5 arthroscopies and open procedure 1 time- tighten patellar tendon and loose IT band   PANCREATECTOMY  11/2013   with splenectomy/hepaticojejunostomy and gastrojejunostomy.   PERIPHERAL VASCULAR INTERVENTION Right 06/04/2019   Procedure: PERIPHERAL VASCULAR INTERVENTION;  Surgeon: Waynetta Sandy, MD;  Location: [facility];  Service: Cardiovascular;  Laterality: Right;  common  iliac   PERIPHERAL VASCULAR INTERVENTION Right 11/26/2019   Procedure: PERIPHERAL VASCULAR INTERVENTION;  Surgeon: Waynetta Sandy, MD;  Location: [facility];  Service: Cardiovascular;  Laterality: Right;  Iliac     Social History   Socioeconomic History    Marital status: Married    Spouse name: Not on file   Number of children: 0   Years of education: Not on file   Highest education level: Not on file  Occupational History   Occupation: disabled  Tobacco Use   Smoking status: Former    Packs/day: 2.00    Years: 30.00    Pack years: 60.00    Types: Cigarettes    Quit date: 04/21/2020    Years since quitting: 1.0   Smokeless tobacco: Never   Tobacco comments:    1 cig daily  Vaping Use   Vaping Use: Some days  Substance and Sexual Activity   Alcohol use: No    Alcohol/week: 0.0 standard drinks   Drug use: No   Sexual activity: Yes    Birth control/protection: None    Comment: Hysterectomy  Other Topics Concern   Not on file  Social History Narrative   Not on file   Social Determinants of Health   Financial Resource Strain: Not on file  Food Insecurity: Not on file  Transportation Needs: Not on file  Physical Activity: Not on file  Stress: Not on file  Social Connections: Not on file  Intimate Partner Violence: Not on file    Family History  Problem Relation Age of Onset   Pancreatic cancer Paternal Grandfather    Cirrhosis Paternal Grandfather    Heart attack Paternal Grandfather    Pancreatitis Other        cousin   Throat cancer Mother    Peripheral Artery Disease Mother    Depression Father    Pancreatitis Father    Colon cancer Neg Hx     Current Outpatient Medications  Medication Sig Dispense Refill   clopidogrel (PLAVIX) 75 MG tablet Take 1 tablet (75 mg total) by mouth daily. (Patient taking differently: Take 75 mg by mouth in the morning.) 90 tablet 3   EPIPEN 2-PAK 0.3 MG/0.3ML SOAJ injection Inject 0.3 mg into the skin daily as needed for anaphylaxis (allergic reaction).     Evolocumab 140 MG/ML SOSY Inject 140 mg into the skin every 14 (fourteen) days. Repatha     glucose blood (PRECISION QID TEST) test strip 1 strip by external route three times daily One touch verio     insulin lispro  (HUMALOG) 100 UNIT/ML injection continuous. INSULIN PUMP     levothyroxine (SYNTHROID) 50 MCG tablet Take 50 mcg by mouth See admin instructions. Take 1 tablet (50 mcg) by mouth on Mondays, Tuesdays, Wednesdays, Thursdays, Fridays & Saturdays     levothyroxine (SYNTHROID) 75 MCG tablet Take 75 mcg by mouth every Sunday.     methylphenidate (RITALIN) 10 MG tablet Take 1 tablet (10 mg total) by mouth 2 (two) times daily. 60 tablet 0   methylphenidate (RITALIN) 10 MG tablet Take 1 tablet (10 mg total) by mouth 2 (two) times daily. 60 tablet 0   Multiple Vitamin (MULTIVITAMIN WITH MINERALS) TABS tablet Take 2 tablets by mouth daily. DEKA     NARCAN 4 MG/0.1ML LIQD nasal spray kit Place 1 spray into the nose once as needed for opioid reversal.     Nutritional Supplements (VIVONEX RTF) LIQD Take 1,250-1,750 mLs by mouth at bedtime. Additional at  lunch     ONETOUCH VERIO test strip 3 (three) times daily.     oxyCODONE (OXY IR/ROXICODONE) 5 MG immediate release tablet Take 1 tablet (5 mg total) by mouth in the morning, at noon, and at bedtime. (Patient taking differently: Take 10 mg by mouth in the morning, at noon, and at bedtime.) 20 tablet 0   Pancrelipase, Lip-Prot-Amyl, (PERTZYE) 16000-57500 units CPEP Take 1 tablet by mouth 3 (three) times daily as needed (with real meals/snack (nutrition not by feeding tube)). Only when eat or snack     pantoprazole (PROTONIX) 40 MG tablet Take 80 mg by mouth at bedtime.      promethazine (PHENERGAN) 12.5 MG tablet Take 1 tablet (12.5 mg total) by mouth every 6 (six) hours as needed for nausea or vomiting. 30 tablet 0   senna (SENOKOT) 8.6 MG TABS tablet Take 1 tablet by mouth daily as needed for mild constipation.     Vitamin D, Ergocalciferol, (DRISDOL) 1.25 MG (50000 UNIT) CAPS capsule Take 50,000 Units by mouth every Monday, Wednesday, and Friday.     No current facility-administered medications for this visit.    Allergies  Allergen Reactions   Erythromycin  Anaphylaxis, Hives and Nausea Only   Levofloxacin Other (See Comments)    Burning during administration IV prior to surgery.  Pt was told not to take it again.   Niaspan [Niacin] Anaphylaxis   Other Anaphylaxis and Other (See Comments)    Derma Bond severe rash   Penicillins Anaphylaxis    Has patient had a PCN reaction causing immediate rash, facial/tongue/throat swelling, SOB or lightheadedness with hypotension: Yes Has patient had a PCN reaction causing severe rash involving mucus membranes or skin necrosis: No Has patient had a PCN reaction that required hospitalization: No Has patient had a PCN reaction occurring within the last 10 years: No If all of the above answers are "NO", then may proceed with Cephalosporin use.    Shellfish Allergy Anaphylaxis   Sulfa Antibiotics Anaphylaxis    "Blisters in my throat"   Wellbutrin [Bupropion] Other (See Comments)    Suicidal thoughts while on Lexapro   Morphine Other (See Comments)    ineffective   Strawberry Extract Hives   Sulfamethoxazole Other (See Comments)    Blister in throat, sores in mouth   Tape Other (See Comments)    Nicoderm Adhesive Patch cause blisters    Cefdinir     Unknown reaction    Clindamycin/Lincomycin Other (See Comments)    Caused C-Diff   Doxycycline Hives and Nausea Only    A 167m tablet formulation caused hives, nausea, vomiting. 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     REVIEW OF SYSTEMS:   [X] denotes positive finding, [ ] denotes negative finding Cardiac  Comments:  Chest pain or chest pressure:    Shortness of breath upon exertion:    Short of breath when lying flat:    Irregular heart rhythm:        Vascular    Pain in  calf, thigh, or hip brought on by ambulation:    Pain in feet at night that wakes you up from your sleep:  Blood clot in your veins:    Leg swelling:         Pulmonary    Oxygen at home:    Productive cough:     Wheezing:         Neurologic    Sudden weakness in arms or legs:     Sudden numbness in arms or legs:     Sudden onset of difficulty speaking or slurred speech:    Temporary loss of vision in one eye:     Problems with dizziness:         Gastrointestinal    Blood in stool:     Vomited blood:         Genitourinary    Burning when urinating:     Blood in urine:        Psychiatric    Major depression:         Hematologic    Bleeding problems:    Problems with blood clotting too easily:        Skin    Rashes or ulcers:        Constitutional    Fever or chills:      PHYSICAL EXAMINATION:  Vitals:   04/30/21 1528  BP: (!) 142/94  Pulse: 79  Resp: 20  Temp: 98.3 F (36.8 C)  TempSrc: Temporal  SpO2: 98%  Weight: 130 lb 12.8 oz (59.3 kg)  Height: 5' 6" (1.676 m)    General:  WDWN in NAD; vital signs documented above Gait: Not observed HENT: WNL, normocephalic Pulmonary: normal non-labored breathing , without Rales, rhonchi,  wheezing Cardiac: regular HR Abdomen: soft, NT, no masses Skin: without rashes Vascular Exam/Pulses:  Right Left  Radial 2+ (normal) 2+ (normal)  DP 2+ (normal) 2+ (normal)   Extremities: without ischemic changes, without Gangrene , without cellulitis; without open wounds;  Musculoskeletal: no muscle wasting or atrophy  Neurologic: A&O X 3;  No focal weakness or paresthesias are detected Psychiatric:  The pt has Normal affect.   Non-Invasive Vascular Imaging:    Left femoropopliteal widely patent without areas of hemodynamically significant stenosis Aorta bifemoral bypass limbs patent at the level of the groins  ABI/TBIToday's ABIToday's TBIPrevious ABIPrevious TBI   +-------+-----------+-----------+------------+------------+  Right  1.14       1.18                                 +-------+-----------+-----------+------------+------------+  Left   1.226      1.05         ASSESSMENT/PLAN:: 45 y.o. female here for surveillance of PAD with history of aortobifemoral bypass graft  -Bilateral lower extremities are well perfused with palpable DP pulses; left lower extremity arterial duplex demonstrates a widely patent left femoral to popliteal bypass graft -Right fifth toe amputation site well-healed -Continue Plavix daily -Recheck bypass surveillance as well as ABIs in 1 year; patient will call/return office sooner with any questions or concerns   Dagoberto Ligas, PA-C [facility] [phone]  Clinic MD:   Scot Dock

## 2021-05-20 ENCOUNTER — Telehealth: Payer: Self-pay

## 2021-05-20 ENCOUNTER — Other Ambulatory Visit: Payer: Self-pay | Admitting: Psychiatry

## 2021-05-20 NOTE — Telephone Encounter (Signed)
pt called left a message she asked if you could give her a few xanax her mom is in hospice and she not expect to live too many more days and she having a hard time with it .

## 2021-05-20 NOTE — Telephone Encounter (Signed)
Could you ask her if she is willing to have a virtual visit tomorrow in available slot to discuss this.

## 2021-05-25 ENCOUNTER — Ambulatory Visit: Payer: Medicare (Managed Care) | Admitting: Psychiatry

## 2021-06-09 ENCOUNTER — Telehealth: Payer: Self-pay

## 2021-06-09 DIAGNOSIS — F909 Attention-deficit hyperactivity disorder, unspecified type: Secondary | ICD-10-CM

## 2021-06-09 MED ORDER — METHYLPHENIDATE HCL 10 MG PO TABS: 20.0000 mg | ORAL_TABLET | ORAL | 0 refills | Status: DC

## 2021-06-09 NOTE — Telephone Encounter (Signed)
pt made an appt to follow up with dr. Vanetta Shawl but pt needs enough medications to get to her next appt that is 1-16th

## 2021-06-09 NOTE — Telephone Encounter (Signed)
I have sent limited supply of her returning to pharmacy.  Will send this message to Dr. Vanetta Shawl to address this once she is back and provide further refills.

## 2021-06-11 ENCOUNTER — Other Ambulatory Visit: Payer: Self-pay | Admitting: Psychiatry

## 2021-06-11 MED ORDER — METHYLPHENIDATE HCL 10 MG PO TABS: 10.0000 mg | ORAL_TABLET | Freq: Two times a day (BID) | ORAL | 0 refills | Status: DC

## 2021-06-11 NOTE — Telephone Encounter (Signed)
Ordered

## 2021-07-02 NOTE — Progress Notes (Signed)
Virtual Visit via Video Note  I connected with Jessica [name] on 07/06/21 at  1:40 PM EST by a video enabled telemedicine application and verified that I am speaking with the correct person using two identifiers.  Location: Patient: home Provider: office Persons participated in the visit- patient, provider    I discussed the limitations of evaluation and management by telemedicine and the availability of in person appointments. The patient expressed understanding and agreed to proceed. I discussed the assessment and treatment plan with the patient. The patient was provided an opportunity to ask questions and all were answered. The patient agreed with the plan and demonstrated an understanding of the instructions.   The patient was advised to call back or seek an in-person evaluation if the symptoms worsen or if the condition fails to improve as anticipated.  I provided 16 minutes of non-face-to-face time during this encounter.   Norman Clay, MD    [facility] MD/PA/NP OP Progress Note  07/06/2021 2:57 PM Jessica [name]  MRN:  [id number]  Chief Complaint:  Chief Complaint   Follow-up; ADHD; Other    HPI:  This is a follow-up appointment for ADHD and insomnia.  She states that her mother passed on 06-15-23.  However, her mother had a good Thanksgiving with her family.  Although she knows that it was the best, her heart does not think that way.  She has been feeling more irritable.  She needs to force herself to do things.  She reports that other people are noticing her irritability as well.  She was started on Lexapro by her PCP about a month ago.  She has not noticed any change at this time.  She has been able to go to work regularly.  She has insomnia.  She denies change in appetite.  She has anhedonia.  She denies SI.  She denies any issues with focus; she has been able to complete tasks, doing multitasking since being on Ritalin.  She denies alcohol use or drug use.   Support: 2  sisters Employed: Occupational psychologist Marital status: married Number of children: 0  Visit Diagnosis:    ICD-10-CM   1. Attention deficit hyperactivity disorder (ADHD), unspecified ADHD type  F90.9     2. Insomnia, unspecified type  G47.00     3. Mild episode of recurrent major depressive disorder (HCC)  F33.0       Past Psychiatric History: Please see initial evaluation for full details. I have reviewed the history. No updates at this time.     Past Medical History:  Past Medical History:  Diagnosis Date   ADHD (attention deficit hyperactivity disorder)    Anxiety    Arthritis    mid back   Diabetes mellitus type I ([place])    Diverticulosis of colon without hemorrhage    DM (diabetes mellitus) ([place]) 11/2013   post pancreatectomy - insulin pump - humalog   Feeding by G-tube ([place])    GERD (gastroesophageal reflux disease)    Hashimoto's thyroiditis    euthyroid   Hypercholesterolemia    Hypothyroidism    PONV (postoperative nausea and vomiting)     Past Surgical History:  Procedure Laterality Date   ABDOMINAL AORTOGRAM W/LOWER EXTREMITY N/A 04/27/2017   Procedure: ABDOMINAL AORTOGRAM W/LOWER EXTREMITY;  Surgeon: Waynetta Sandy, MD;  Location: [place] [facility];  Service: Cardiovascular;  Laterality: N/A;   ABDOMINAL AORTOGRAM W/LOWER EXTREMITY Bilateral 06/04/2019   Procedure: ABDOMINAL AORTOGRAM W/LOWER EXTREMITY;  Surgeon: Waynetta Sandy,  MD;  Location: [place] [facility];  Service: Cardiovascular;  Laterality: Bilateral;   ABDOMINAL AORTOGRAM W/LOWER EXTREMITY N/A 11/26/2019   Procedure: ABDOMINAL AORTOGRAM W/ Right LOWER EXTREMITY [place];  Surgeon: Waynetta Sandy, MD;  Location: [place] [facility];  Service: Cardiovascular;  Laterality: N/A;   ABDOMINAL AORTOGRAM W/LOWER EXTREMITY N/A 07/28/2020   Procedure: ABDOMINAL AORTOGRAM W/LOWER EXTREMITY;  Surgeon: Waynetta Sandy, MD;  Location: [place] [facility];  Service: Cardiovascular;   Laterality: N/A;   ABDOMINAL HYSTERECTOMY     AMPUTATION Right 11/05/2020   Procedure: RIGHT SMALL TOE AMPUTATION;  Surgeon: Waynetta Sandy, MD;  Location: [place];  Service: Vascular;  Laterality: Right;   AORTA - BILATERAL FEMORAL ARTERY BYPASS GRAFT Bilateral 08/07/2020   Procedure: AORTA BIFEMORAL BYPASS GRAFTING;  Surgeon: Waynetta Sandy, MD;  Location: [place];  Service: Vascular;  Laterality: Bilateral;   BIOPSY N/A 05/15/2014   Procedure: GASTIC,  ASCENDING, AND DESCEDNING/SIGMOID  COLON BIOPSY;  Surgeon: Daneil Dolin, MD;  Location: [facility];  Service: Endoscopy;  Laterality: N/A;   CHOLECYSTECTOMY     COLONOSCOPY WITH PROPOFOL N/A 05/15/2014   Dr. Gala Romney (primary GI is Dr. Oneida Alar). Hemorrhoids, negative random colon biopsies. GI pathogen panel negative. Diverticulosis.   complete hysterectomy     ESOPHAGOGASTRODUODENOSCOPY (EGD) WITH PROPOFOL N/A 05/15/2014   Dr. Gala Romney (primary GI Dr. 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   KNEE SURGERY Right    X 6-5 arthroscopies and open procedure 1 time- tighten patellar tendon and loose IT band   PANCREATECTOMY  11/2013   with splenectomy/hepaticojejunostomy and gastrojejunostomy.   PERIPHERAL VASCULAR INTERVENTION Right 06/04/2019   Procedure: PERIPHERAL VASCULAR INTERVENTION;  Surgeon: Waynetta Sandy, MD;  Location: [place] [facility];  Service: Cardiovascular;  Laterality: Right;  common iliac   PERIPHERAL VASCULAR INTERVENTION Right 11/26/2019   Procedure: PERIPHERAL VASCULAR INTERVENTION;  Surgeon: Waynetta Sandy, MD;  Location: [place] [facility];  Service: Cardiovascular;  Laterality: Right;  Iliac     Family Psychiatric History: Please see initial evaluation for full details. I have reviewed the history. No updates at this time.     Family History:  Family History  Problem Relation Age  of Onset   Pancreatic cancer Paternal Grandfather    Cirrhosis Paternal Grandfather    Heart attack Paternal Grandfather    Pancreatitis Other        cousin   Throat cancer Mother    Peripheral Artery Disease Mother    Depression Father    Pancreatitis Father    Colon cancer Neg Hx     Social History:  Social History   Socioeconomic History   Marital status: Married    Spouse name: Not on file   Number of children: 0   Years of education: Not on file   Highest education level: Not on file  Occupational History   Occupation: disabled  Tobacco Use   Smoking status: Former    Packs/day: 2.00    Years: 30.00    Pack years: 60.00    Types: Cigarettes    Quit date: 04/21/2020    Years since quitting: 1.2   Smokeless tobacco: Never   Tobacco comments:    1 cig daily  Vaping Use   Vaping Use: Some days  Substance and Sexual Activity   Alcohol use: No    Alcohol/week: 0.0 standard  drinks   Drug use: No   Sexual activity: Yes    Birth control/protection: None    Comment: Hysterectomy  Other Topics Concern   Not on file  Social History Narrative   Not on file   Social Determinants of Health   Financial Resource Strain: Not on file  Food Insecurity: Not on file  Transportation Needs: Not on file  Physical Activity: Not on file  Stress: Not on file  Social Connections: Not on file    Allergies:  Allergies  Allergen Reactions   Erythromycin Anaphylaxis, Hives and Nausea Only   Levofloxacin Other (See Comments)    Burning during administration IV prior to surgery.  Pt was told not to take it again.   Niaspan [Niacin] Anaphylaxis   Other Anaphylaxis and Other (See Comments)    Derma Bond severe rash   Penicillins Anaphylaxis    Has patient had a PCN reaction causing immediate rash, facial/tongue/throat swelling, SOB or lightheadedness with hypotension: Yes Has patient had a PCN reaction causing severe rash involving mucus membranes or skin necrosis: No Has  patient had a PCN reaction that required hospitalization: No Has patient had a PCN reaction occurring within the last 10 years: No If all of the above answers are "NO", then may proceed with Cephalosporin use.    Shellfish Allergy Anaphylaxis   Sulfa Antibiotics Anaphylaxis    "Blisters in my throat"   Wellbutrin [Bupropion] Other (See Comments)    Suicidal thoughts while on Lexapro   Morphine Other (See Comments)    ineffective   Strawberry Extract Hives   Sulfamethoxazole Other (See Comments)    Blister in throat, sores in mouth   Tape Other (See Comments)    Nicoderm Adhesive Patch cause blisters    Cefdinir     Unknown reaction    Clindamycin/Lincomycin Other (See Comments)    Caused C-Diff   Doxycycline Hives and Nausea Only    A 124m tablet formulation caused hives, nausea, vomiting. 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 30 tablet 0   clopidogrel (PLAVIX) 75 MG tablet Take 1 tablet (75 mg total) by mouth daily. (Patient taking differently: Take 75 mg by mouth in the morning.) 90 tablet 3   EPIPEN 2-PAK 0.3 MG/0.3ML SOAJ  injection Inject 0.3 mg into the skin daily as needed for anaphylaxis (allergic reaction).     Evolocumab 140 MG/ML SOSY Inject 140 mg into the skin every 14 (fourteen) days. Repatha     glucose blood (PRECISION QID TEST) test strip 1 strip by external route three times daily  One touch verio     insulin lispro (HUMALOG) 100 UNIT/ML injection continuous. INSULIN PUMP     levothyroxine (SYNTHROID) 50 MCG tablet Take 50 mcg by mouth See admin instructions. Take 1 tablet (50 mcg) by mouth on Mondays, Tuesdays, Wednesdays, Thursdays, Fridays & Saturdays     levothyroxine (SYNTHROID) 75 MCG tablet Take 75 mcg by mouth every Sunday.     methylphenidate (RITALIN) 10 MG tablet Take 1 tablet (10 mg total) by mouth 2 (two) times daily. 60 tablet 0   Multiple Vitamin (MULTIVITAMIN WITH MINERALS) TABS tablet Take 2 tablets by mouth daily. DEKA     NARCAN 4 MG/0.1ML LIQD nasal spray kit Place 1 spray into the nose once as needed for opioid reversal.     Nutritional Supplements (VIVONEX RTF) LIQD Take 1,250-1,750 mLs by mouth at bedtime. Additional at lunch     [facility] VERIO test strip 3 (three) times daily.     oxyCODONE (OXY IR/ROXICODONE) 5 MG immediate release tablet Take 1 tablet (5 mg total) by mouth in the morning, at noon, and at bedtime. (Patient taking differently: Take 10 mg by mouth in the morning, at noon, and at bedtime.) 20 tablet 0   Pancrelipase, Lip-Prot-Amyl, (PERTZYE) 16000-57500 units CPEP Take 1 tablet by mouth 3 (three) times daily as needed (with real meals/snack (nutrition not by feeding tube)). Only when eat or snack     pantoprazole (PROTONIX) 40 MG tablet Take 80 mg by mouth at bedtime.      promethazine (PHENERGAN) 12.5 MG tablet Take 1 tablet (12.5 mg total) by mouth every 6 (six) hours as needed for nausea or vomiting. 30 tablet 0   senna (SENOKOT) 8.6 MG TABS tablet Take 1 tablet by mouth daily as needed for mild constipation.     Vitamin D, Ergocalciferol, (DRISDOL) 1.25 MG (50000  UNIT) CAPS capsule Take 50,000 Units by mouth every Monday, Wednesday, and Friday.     No current facility-administered medications for this visit.     Musculoskeletal: Strength & Muscle Tone:  N/A Gait & Station:  N/A Patient leans: N/A  Psychiatric Specialty Exam: Review of Systems  Psychiatric/Behavioral:  Positive for dysphoric mood and sleep disturbance. Negative for agitation, behavioral problems, confusion, decreased concentration, hallucinations, self-injury and suicidal ideas. The patient is nervous/anxious. The patient is not hyperactive.   All other systems reviewed and are negative.  There were no vitals taken for this visit.There is no height or weight on file to calculate BMI.  General Appearance: Fairly Groomed  Eye Contact:  Good  Speech:  Clear and Coherent  Volume:  Normal  Mood:  Depressed  Affect:  Appropriate, Congruent, and down  Thought Process:  Coherent  Orientation:  Full (Time, Place, and Person)  Thought Content: Logical   Suicidal Thoughts:  No  Homicidal Thoughts:  No  Memory:  Immediate;   Good  Judgement:  Good  Insight:  Good  Psychomotor Activity:  Normal  Concentration:  Concentration: Good and Attention Span: Good  Recall:  Good  Fund of Knowledge: Good  Language: Good  Akathisia:  No  Handed:  Right  AIMS (if indicated): not done  Assets:  Communication Skills Desire for Improvement  ADL's:  Intact  Cognition: WNL  Sleep:  Poor   Screenings: PHQ2-9    Flowsheet Row Video Visit from 03/30/2021 in [facility] Video Visit from 11/04/2020 in [place] Video Visit from 09/02/2020 in [place]  PHQ-2 Total Score 0 1  0      Flowsheet Row Video Visit from 03/30/2021 in [place] Admission (Discharged) from 11/05/2020 in [place] Video Visit from 11/04/2020 in [place] No Risk No Risk No Risk        Assessment and Plan:  SEDRA MORFIN is a 46 y.o. year old female with a history of ADHD, depression,  hashimoto's thyroiditis, AIH/Hereditary pancreatitis s/p islet cell transplant, diabetes, s/p Roux-en- Y bypass surgery, left lower extremity bypass with a subsequent balloon angioplasty , who presents for follow up appointment for below.    1. Attention deficit hyperactivity disorder (ADHD), unspecified ADHD type She reports good benefit from Ritalin.  Will continue current dose to target ADHD.   3. Mild episode of recurrent major depressive disorder ([place]) There has been worsening in depressive symptoms in the context of losing her mother in December.  She was started on Lexapro.  Although she reports limited benefit from this medication, she has been on this medication for a few weeks only.  However, because of the severity of her symptoms/its impact on her daily activity, will add quetiapine as adjunctive treatment for depression, which would also be beneficial for insomnia.  Discussed potential metabolic side effect and EPS.   This clinician has discussed the side effect associated with medication prescribed during this encounter. Please refer to notes in the previous encounters for more details.        Plan Continue Ritalin 10 mg in the a.m., 10 mg at lunchtime Start quetiapine 25 mg at night  Continue lexapro 20 mg daily- she declined a refill (recently prescribed by her PCP) Next appointment- 2/13 at 3:30 for 30 mins, video   Past trials of medication: sertraline, fluoxetine, Paxil, citalopram, lexapro, bupropion (voices), venlafaxine, duloxetine, trintellix, mirtazapine, trazodone, amitriptyline, doxepin (insomnia), lamotrigine, Geodon, olanzapine, quetiapine, Vraylar, xanax, clonazepam, temazepam, ritalin, trazodone, Ambien, Rozerem, Lunesta, Belsomra, benadryl, Concerta (palpitation at 36 mg)   The patient demonstrates the  following risk factors for suicide: Chronic risk factors for suicide include: psychiatric disorder of depression and medical illness pancreatitis. Acute risk factors for suicide include: unemployment. Protective factors for this patient include: positive social support, coping skills and hope for the future. Considering these factors, the overall suicide risk at this point appears to be low. Patient is appropriate for outpatient follow up.    Norman Clay, MD 07/06/2021, 2:57 PM

## 2021-07-06 ENCOUNTER — Encounter: Payer: Self-pay | Admitting: Psychiatry

## 2021-07-06 ENCOUNTER — Telehealth (INDEPENDENT_AMBULATORY_CARE_PROVIDER_SITE_OTHER): Payer: Medicare HMO | Admitting: Psychiatry

## 2021-07-06 ENCOUNTER — Other Ambulatory Visit: Payer: Self-pay

## 2021-07-06 DIAGNOSIS — F330 Major depressive disorder, recurrent, mild: Secondary | ICD-10-CM

## 2021-07-06 DIAGNOSIS — G4700 Insomnia, unspecified: Secondary | ICD-10-CM | POA: Diagnosis not present

## 2021-07-06 DIAGNOSIS — F909 Attention-deficit hyperactivity disorder, unspecified type: Secondary | ICD-10-CM | POA: Diagnosis not present

## 2021-07-06 MED ORDER — METHYLPHENIDATE HCL 10 MG PO TABS: 10.0000 mg | ORAL_TABLET | Freq: Two times a day (BID) | ORAL | 0 refills | Status: DC

## 2021-07-06 MED ORDER — QUETIAPINE FUMARATE 25 MG PO TABS: 25.0000 mg | ORAL_TABLET | Freq: Every day | ORAL | 0 refills | Status: DC

## 2021-07-06 NOTE — Patient Instructions (Signed)
Continue Ritalin 10 mg in the a.m., 10 mg at lunchtime Start quetiapine 25 mg at night  Continue lexapro 20 mg daily Next appointment- 2/13 at 3:30

## 2021-07-14 ENCOUNTER — Telehealth (HOSPITAL_COMMUNITY): Payer: Self-pay | Admitting: *Deleted

## 2021-07-14 NOTE — Telephone Encounter (Signed)
LMOM to schedule appt.

## 2021-07-30 NOTE — Progress Notes (Signed)
Virtual Visit via Video Note ° °I connected with [name] on 08/03/21 at  3:30 PM EST by a video enabled telemedicine application and verified that I am speaking with the correct person using two identifiers. ° °Location: °Patient: car °Provider: office °Persons participated in the visit- patient, provider  °  °I discussed the limitations of evaluation and management by telemedicine and the availability of in person appointments. The patient expressed understanding and agreed to proceed.  °I discussed the assessment and treatment plan with the patient. The patient was provided an opportunity to ask questions and all were answered. The patient agreed with the plan and demonstrated an understanding of the instructions. °  °The patient was advised to call back or seek an in-person evaluation if the symptoms worsen or if the condition fails to improve as anticipated. ° °I provided 13 minutes of non-face-to-face time during this encounter. ° ° ° , MD ° ° ° °BH MD/PA/NP OP Progress Note ° °08/03/2021 4:01 PM °Deshia L Gimbel  °MRN:  [id number] ° °Chief Complaint:  °Chief Complaint   °Depression; Follow-up °  ° °HPI:  °This is a follow-up appointment for depression and insomnia.  °She states that she has been doing better.  Although she occasionally feels more emotional when people talk about the mother, she has been doing good otherwise.  She works most of the time and lately up to 30 hours/week.  Although she initially had issues with anger, she has been handling things well, using coping skills she learned through therapy.  She finds breathing technique and relaxing in the bed have been helpful for her.  She did not try quetiapine as she was concerned about drowsiness the following day.  She continues to struggle with initial and middle insomnia.  She is willing to try ramelteon at this time.  She has been able to focus well as long as she takes Ritalin.  She tends to be easily distracted and cannot focus  on things when she forgets to take the medication.  She denies change in appetite.  She feels less anxious.  She denies panic attacks.  She denies SI.  ° ° °Support: 2 sisters °Employed: pharmacy tech °Marital status: married °Number of children: 0 ° ° °Visit Diagnosis:  °  ICD-10-CM   °1. MDD (major depressive disorder), recurrent, in partial remission (HCC)  F33.41   °  °2. Insomnia, unspecified type  G47.00   °  °3. Attention deficit hyperactivity disorder (ADHD), unspecified ADHD type  F90.9   °  ° ° °Past Psychiatric History: Please see initial evaluation for full details. I have reviewed the history. No updates at this time.  °  ° °Past Medical History:  °Past Medical History:  °Diagnosis Date  ° ADHD (attention deficit hyperactivity disorder)   ° Anxiety   ° Arthritis   ° mid back  ° Diabetes mellitus type I (HCC)   ° Diverticulosis of colon without hemorrhage   ° DM (diabetes mellitus) (HCC) 11/2013  ° post pancreatectomy - insulin pump - humalog  ° Feeding by G-tube (HCC)   ° GERD (gastroesophageal reflux disease)   ° Hashimoto's thyroiditis   ° euthyroid  ° Hypercholesterolemia   ° Hypothyroidism   ° PONV (postoperative nausea and vomiting)   °  °Past Surgical History:  °Procedure Laterality Date  ° ABDOMINAL AORTOGRAM W/LOWER EXTREMITY N/A 04/27/2017  ° Procedure: ABDOMINAL AORTOGRAM W/LOWER EXTREMITY;  Surgeon: Cain, Brandon Christopher, MD;  Location: [facility];  Service:   Cardiovascular;  Laterality: N/A;   ABDOMINAL AORTOGRAM W/LOWER EXTREMITY Bilateral 06/04/2019   Procedure: ABDOMINAL AORTOGRAM W/LOWER EXTREMITY;  Surgeon: Waynetta Sandy, MD;  Location: [facility];  Service: Cardiovascular;  Laterality: Bilateral;   ABDOMINAL AORTOGRAM W/LOWER EXTREMITY N/A 11/26/2019   Procedure: ABDOMINAL AORTOGRAM W/ Right LOWER EXTREMITY [place];  Surgeon: Waynetta Sandy, MD;  Location: [facility];  Service: Cardiovascular;  Laterality: N/A;   ABDOMINAL AORTOGRAM  W/LOWER EXTREMITY N/A 07/28/2020   Procedure: ABDOMINAL AORTOGRAM W/LOWER EXTREMITY;  Surgeon: Waynetta Sandy, MD;  Location: [facility];  Service: Cardiovascular;  Laterality: N/A;   ABDOMINAL HYSTERECTOMY     AMPUTATION Right 11/05/2020   Procedure: RIGHT SMALL TOE AMPUTATION;  Surgeon: Waynetta Sandy, MD;  Location: [facility];  Service: Vascular;  Laterality: Right;   AORTA - BILATERAL FEMORAL ARTERY BYPASS GRAFT Bilateral 08/07/2020   Procedure: AORTA BIFEMORAL BYPASS GRAFTING;  Surgeon: Waynetta Sandy, MD;  Location: [facility];  Service: Vascular;  Laterality: Bilateral;   BIOPSY N/A 05/15/2014   Procedure: GASTIC,  ASCENDING, AND DESCEDNING/SIGMOID  COLON BIOPSY;  Surgeon: Daneil Dolin, MD;  Location: [facility];  Service: Endoscopy;  Laterality: N/A;   CHOLECYSTECTOMY     COLONOSCOPY WITH PROPOFOL N/A 05/15/2014   Dr. Gala Romney (primary GI is Dr. Oneida Alar). Hemorrhoids, negative random colon biopsies. GI pathogen panel negative. Diverticulosis.   complete hysterectomy     ESOPHAGOGASTRODUODENOSCOPY (EGD) WITH PROPOFOL N/A 05/15/2014   Dr. Gala Romney (primary GI Dr. 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   KNEE SURGERY Right    X 6-5 arthroscopies and open procedure 1 time- tighten patellar tendon and loose IT band   PANCREATECTOMY  11/2013   with splenectomy/hepaticojejunostomy and gastrojejunostomy.   PERIPHERAL VASCULAR INTERVENTION Right 06/04/2019   Procedure: PERIPHERAL VASCULAR INTERVENTION;  Surgeon: Waynetta Sandy, MD;  Location: [facility];  Service: Cardiovascular;  Laterality: Right;  common iliac   PERIPHERAL VASCULAR INTERVENTION Right 11/26/2019   Procedure: PERIPHERAL VASCULAR INTERVENTION;  Surgeon: Waynetta Sandy, MD;  Location: [facility];  Service: Cardiovascular;  Laterality: Right;  Iliac     Family  Psychiatric History: Please see initial evaluation for full details. I have reviewed the history. No updates at this time.     Family History:  Family History  Problem Relation Age of Onset   Pancreatic cancer Paternal Grandfather    Cirrhosis Paternal Grandfather    Heart attack Paternal Grandfather    Pancreatitis Other        cousin   Throat cancer Mother    Peripheral Artery Disease Mother    Depression Father    Pancreatitis Father    Colon cancer Neg Hx     Social History:  Social History   Socioeconomic History   Marital status: Married    Spouse name: Not on file   Number of children: 0   Years of education: Not on file   Highest education level: Not on file  Occupational History   Occupation: disabled  Tobacco Use   Smoking status: Former    Packs/day: 2.00    Years: 30.00    Pack years: 60.00    Types: Cigarettes    Quit date: 04/21/2020    Years since quitting: 1.2   Smokeless tobacco: Never   Tobacco comments:    1 cig daily  Vaping Use  ° Vaping Use: Some days  °Substance and Sexual Activity  ° Alcohol use: No  °  Alcohol/week: 0.0 standard drinks  ° Drug use: No  ° Sexual activity: Yes  °  Birth control/protection: None  °  Comment: Hysterectomy  °Other Topics Concern  ° Not on file  °Social History Narrative  ° Not on file  ° °Social Determinants of Health  ° °Financial Resource Strain: Not on file  °Food Insecurity: Not on file  °Transportation Needs: Not on file  °Physical Activity: Not on file  °Stress: Not on file  °Social Connections: Not on file  ° ° °Allergies:  °Allergies  °Allergen Reactions  ° Erythromycin Anaphylaxis, Hives and Nausea Only  ° Levofloxacin Other (See Comments)  °  Burning during administration IV prior to surgery.  Pt was told not to take it again.  ° Niaspan [Niacin] Anaphylaxis  ° Other Anaphylaxis and Other (See Comments)  °  Derma Bond severe rash  ° Penicillins Anaphylaxis  °  Has patient had a PCN reaction causing immediate rash,  facial/tongue/throat swelling, SOB or lightheadedness with hypotension: Yes °Has patient had a PCN reaction causing severe rash involving mucus membranes or skin necrosis: No °Has patient had a PCN reaction that required hospitalization: No °Has patient had a PCN reaction occurring within the last 10 years: No °If all of the above answers are "NO", then may proceed with Cephalosporin use. °  ° Shellfish Allergy Anaphylaxis  ° Sulfa Antibiotics Anaphylaxis  °  "Blisters in my throat"  ° Wellbutrin [Bupropion] Other (See Comments)  °  Suicidal thoughts while on Lexapro  ° Morphine Other (See Comments)  °  ineffective  ° Strawberry Extract Hives  ° Sulfamethoxazole Other (See Comments)  °  Blister in throat, sores in mouth  ° Tape Other (See Comments)  °  Nicoderm Adhesive Patch cause blisters °  ° Cefdinir   °  Unknown reaction   ° Clindamycin/Lincomycin Other (See Comments)  °  Caused C-Diff  ° Doxycycline Hives and Nausea Only  °  A 100mg tablet formulation caused hives, nausea, vomiting. 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 30 tablet 0  ° clopidogrel  (PLAVIX) 75 MG tablet Take 1 tablet (75 mg total) by mouth daily. (Patient taking differently: Take 75 mg by mouth in the morning.) 90 tablet 3  ° EPIPEN 2-PAK 0.3 MG/0.3ML SOAJ injection Inject 0.3 mg into the skin daily as needed for anaphylaxis (allergic reaction).    ° escitalopram (LEXAPRO) 20 MG tablet Take 20 mg by mouth   daily.    ° Evolocumab 140 MG/ML SOSY Inject 140 mg into the skin every 14 (fourteen) days. Repatha    ° glucose blood (PRECISION QID TEST) test strip 1 strip by external route three times daily One touch verio    ° insulin lispro (HUMALOG) 100 UNIT/ML injection continuous. INSULIN PUMP    ° levothyroxine (SYNTHROID) 50 MCG tablet Take 50 mcg by mouth See admin instructions. Take 1 tablet (50 mcg) by mouth on Mondays, Tuesdays, Wednesdays, Thursdays, Fridays & Saturdays    ° levothyroxine (SYNTHROID) 75 MCG tablet Take 75 mcg by mouth every Sunday.    ° [START ON 08/10/2021] methylphenidate (RITALIN) 10 MG tablet Take 1 tablet (10 mg total) by mouth 2 (two) times daily. 60 tablet 0  ° Multiple Vitamin (MULTIVITAMIN WITH MINERALS) TABS tablet Take 2 tablets by mouth daily. DEKA    ° NARCAN 4 MG/0.1ML LIQD nasal spray kit Place 1 spray into the nose once as needed for opioid reversal.    ° Nutritional Supplements (VIVONEX RTF) LIQD Take 1,250-1,750 mLs by mouth at bedtime. Additional at lunch    ° ONETOUCH VERIO test strip 3 (three) times daily.    ° oxyCODONE (OXY IR/ROXICODONE) 5 MG immediate release tablet Take 1 tablet (5 mg total) by mouth in the morning, at noon, and at bedtime. (Patient taking differently: Take 10 mg by mouth in the morning, at noon, and at bedtime.) 20 tablet 0  ° Pancrelipase, Lip-Prot-Amyl, (PERTZYE) 16000-57500 units CPEP Take 1 tablet by mouth 3 (three) times daily as needed (with real meals/snack (nutrition not by feeding tube)). Only when eat or snack    ° pantoprazole (PROTONIX) 40 MG tablet Take 80 mg by mouth at bedtime.     ° promethazine (PHENERGAN) 12.5 MG  tablet Take 1 tablet (12.5 mg total) by mouth every 6 (six) hours as needed for nausea or vomiting. 30 tablet 0  ° senna (SENOKOT) 8.6 MG TABS tablet Take 1 tablet by mouth daily as needed for mild constipation.    ° Vitamin D, Ergocalciferol, (DRISDOL) 1.25 MG (50000 UNIT) CAPS capsule Take 50,000 Units by mouth every Monday, Wednesday, and Friday.    ° °No current facility-administered medications for this visit.  ° ° ° °Musculoskeletal: °Strength & Muscle Tone:  N/A °Gait & Station:  N/A °Patient leans: N/A ° °Psychiatric Specialty Exam: °Review of Systems  °Psychiatric/Behavioral:  Positive for decreased concentration, dysphoric mood and sleep disturbance. Negative for agitation, behavioral problems, confusion, hallucinations, self-injury and suicidal ideas. The patient is not nervous/anxious and is not hyperactive.   °All other systems reviewed and are negative.  °There were no vitals taken for this visit.There is no height or weight on file to calculate BMI.  °General Appearance: Fairly Groomed  °Eye Contact:  Good  °Speech:  Clear and Coherent  °Volume:  Normal  °Mood:   better  °Affect:  Appropriate, Congruent, and calmer  °Thought Process:  Coherent  °Orientation:  Full (Time, Place, and Person)  °Thought Content: Logical   °Suicidal Thoughts:  No  °Homicidal Thoughts:  No  °Memory:  Immediate;   Good  °Judgement:  Good  °Insight:  Good  °Psychomotor Activity:  Normal  °Concentration:  Concentration: Good and Attention Span: Good  °Recall:  Good  °Fund of Knowledge: Good  °Language: Good  °Akathisia:  No  °Handed:  Right  °AIMS (if indicated): not done  °Assets:  Communication Skills °Desire for Improvement  °ADL's:  Intact  °Cognition: WNL  °Sleep:    Poor   Screenings: PHQ2-9    Flowsheet Row Video Visit from 08/03/2021 in [facility] Video Visit from 03/30/2021 in [facility] Video Visit from 11/04/2020 in [facility]  Video Visit from 09/02/2020 in [facility]  PHQ-2 Total Score 1 0 1 0      Flowsheet Row Video Visit from 03/30/2021 in [facility] Admission (Discharged) from 11/05/2020 in [facility] Video Visit from 11/04/2020 in [facility] No Risk No Risk No Risk        Assessment and Plan:  JACIE TRISTAN is a 46 y.o. year old female with a history of ADHD, depression,  hashimoto's thyroiditis, AIH/Hereditary pancreatitis s/p islet cell transplant, diabetes, s/p Roux-en- Y bypass surgery, left lower extremity bypass with a subsequent balloon angioplasty , who presents for follow up appointment for below.   1. MDD (major depressive disorder), recurrent, in partial remission ([place]) There has been overall improvement in mood symptoms, which was coincided with being on Lexapro.  Psychosocial stressors includes loss of her mother in December.  Will continue current dose of Lexapro to target depression and anxiety.  Noted that she did not try quetiapine due to concern of drowsiness; will not start this medication given improvement in her symptoms.   2. Insomnia, unspecified type She has initial and middle insomnia.  Will try ramelteon to target insomnia.  Discussed potential risk of drowsiness.   3. Attention deficit hyperactivity disorder (ADHD), unspecified ADHD type She has had a good benefit from Ritalin  Will continue current dose to target ADHD.   This clinician has discussed the side effect associated with medication prescribed during this encounter. Please refer to notes in the previous encounters for more details.        Plan Continue lexapro 20 mg daily  Continue Ritalin 10 mg in the a.m., 10 mg at lunchtime Start ramelteon 8 mg at night as needed for insomnia Next appointment- 3/13 at 11 AM for 30 mins, video  Past trials of medication: sertraline, fluoxetine, Paxil,  citalopram, lexapro, bupropion (voices), venlafaxine, duloxetine, trintellix, mirtazapine, trazodone, amitriptyline, doxepin (insomnia), lamotrigine, Geodon, olanzapine, quetiapine, Vraylar, xanax, clonazepam, temazepam, ritalin, trazodone, Ambien, Rozerem, Lunesta, Belsomra, benadryl, Concerta (palpitation at 36 mg)   The patient demonstrates the following risk factors for suicide: Chronic risk factors for suicide include: psychiatric disorder of depression and medical illness pancreatitis. Acute risk factors for suicide include: unemployment. Protective factors for this patient include: positive social support, coping skills and hope for the future. Considering these factors, the overall suicide risk at this point appears to be low. Patient is appropriate for outpatient follow up.     Norman Clay, MD 08/03/2021, 4:01 PM

## 2021-08-03 ENCOUNTER — Other Ambulatory Visit: Payer: Self-pay

## 2021-08-03 ENCOUNTER — Telehealth (INDEPENDENT_AMBULATORY_CARE_PROVIDER_SITE_OTHER): Payer: Medicare (Managed Care) | Admitting: Psychiatry

## 2021-08-03 ENCOUNTER — Encounter: Payer: Self-pay | Admitting: Psychiatry

## 2021-08-03 DIAGNOSIS — G4700 Insomnia, unspecified: Secondary | ICD-10-CM

## 2021-08-03 DIAGNOSIS — F3341 Major depressive disorder, recurrent, in partial remission: Secondary | ICD-10-CM

## 2021-08-03 DIAGNOSIS — F909 Attention-deficit hyperactivity disorder, unspecified type: Secondary | ICD-10-CM

## 2021-08-03 MED ORDER — METHYLPHENIDATE HCL 10 MG PO TABS: 10.0000 mg | ORAL_TABLET | Freq: Two times a day (BID) | ORAL | 0 refills | Status: AC

## 2021-08-03 MED ORDER — RAMELTEON 8 MG PO TABS: 8.0000 mg | ORAL_TABLET | Freq: Every day | ORAL | 0 refills | Status: AC

## 2021-08-03 NOTE — Patient Instructions (Signed)
Continue lexapro 20 mg daily  Continue Ritalin 10 mg in the a.m., 10 mg at lunchtime Start ramelteon 8 mg at night as needed for insomnia Next appointment- 3/13 at 11 AM

## 2021-08-04 ENCOUNTER — Telehealth: Payer: Self-pay | Admitting: Psychiatry

## 2021-08-04 NOTE — Telephone Encounter (Signed)
I see that her insurance does not cover ramelteon. could you find out which medication would be covered for her? Thanks.

## 2021-08-28 NOTE — Progress Notes (Signed)
Virtual Visit via Video Note  I connected with Jessica [name] on 08/31/21 at 11:00 AM EDT by a video enabled telemedicine application and verified that I am speaking with the correct person using two identifiers.  Location: Patient: home Provider: office Persons participated in the visit- patient, provider    I discussed the limitations of evaluation and management by telemedicine and the availability of in person appointments. The patient expressed understanding and agreed to proceed.    I discussed the assessment and treatment plan with the patient. The patient was provided an opportunity to ask questions and all were answered. The patient agreed with the plan and demonstrated an understanding of the instructions.   The patient was advised to call back or seek an in-person evaluation if the symptoms worsen or if the condition fails to improve as anticipated.  I provided 20 minutes of non-face-to-face time during this encounter.   Norman Clay, MD    [facility] MD/PA/NP OP Progress Note  08/31/2021 11:25 AM Jessica [name]  MRN:  [id number]  Chief Complaint:  Chief Complaint  Patient presents with   Follow-up   Depression   HPI:  This is a follow-up appointment for depression and ADHD.  She states that she has been doing well except she continues to have insomnia.  She has not been able to start Belsomra due to prior authorization.  She has been doing well at work.  She thinks she finds herself to be more productive since starting the job.  She likes helping other people.  She worked on rearranging living room, and she likes it . Although she occasionally thinks about her mother, it has been less intense. She feels tense most of the time, and tries to relax.  She uses breathing techniques.  She has initial and middle insomnia.  She has racing thoughts about random things.  She denies irritability.  She denies change in appetite.  She denies feeling depressed.   She denies panic attacks.   She denies SI.  She denies alcohol use or drug use.  She is willing to try quetiapine at this time.   Support: 2 sisters Employed: Occupational psychologist Marital status: married Number of children: 0  Visit Diagnosis:    ICD-10-CM   1. MDD (major depressive disorder), recurrent, in partial remission ([place])  F33.41     2. Insomnia, unspecified type  G47.00     3. Attention deficit hyperactivity disorder (ADHD), unspecified ADHD type  F90.9       Past Psychiatric History: Please see initial evaluation for full details. I have reviewed the history. No updates at this time.     Past Medical History:  Past Medical History:  Diagnosis Date   ADHD (attention deficit hyperactivity disorder)    Anxiety    Arthritis    mid back   Diabetes mellitus type I ([place])    Diverticulosis of colon without hemorrhage    DM (diabetes mellitus) ([place]) 11/2013   post pancreatectomy - insulin pump - humalog   Feeding by G-tube ([place])    GERD (gastroesophageal reflux disease)    Hashimoto's thyroiditis    euthyroid   Hypercholesterolemia    Hypothyroidism    PONV (postoperative nausea and vomiting)     Past Surgical History:  Procedure Laterality Date   ABDOMINAL AORTOGRAM W/LOWER EXTREMITY N/A 04/27/2017   Procedure: ABDOMINAL AORTOGRAM W/LOWER EXTREMITY;  Surgeon: Waynetta Sandy, MD;  Location: [facility];  Service: Cardiovascular;  Laterality: N/A;   ABDOMINAL  AORTOGRAM W/LOWER EXTREMITY Bilateral 06/04/2019   Procedure: ABDOMINAL AORTOGRAM W/LOWER EXTREMITY;  Surgeon: Waynetta Sandy, MD;  Location: [place] [facility];  Service: Cardiovascular;  Laterality: Bilateral;   ABDOMINAL AORTOGRAM W/LOWER EXTREMITY N/A 11/26/2019   Procedure: ABDOMINAL AORTOGRAM W/ Right LOWER EXTREMITY [place];  Surgeon: Waynetta Sandy, MD;  Location: [facility] [facility];  Service: Cardiovascular;  Laterality: N/A;   ABDOMINAL AORTOGRAM W/LOWER EXTREMITY N/A 07/28/2020   Procedure: ABDOMINAL  AORTOGRAM W/LOWER EXTREMITY;  Surgeon: Waynetta Sandy, MD;  Location: [facility];  Service: Cardiovascular;  Laterality: N/A;   ABDOMINAL HYSTERECTOMY     AMPUTATION Right 11/05/2020   Procedure: RIGHT SMALL TOE AMPUTATION;  Surgeon: Waynetta Sandy, MD;  Location: [facility];  Service: Vascular;  Laterality: Right;   AORTA - BILATERAL FEMORAL ARTERY BYPASS GRAFT Bilateral 08/07/2020   Procedure: AORTA BIFEMORAL BYPASS GRAFTING;  Surgeon: Waynetta Sandy, MD;  Location: [facility];  Service: Vascular;  Laterality: Bilateral;   BIOPSY N/A 05/15/2014   Procedure: GASTIC,  ASCENDING, AND DESCEDNING/SIGMOID  COLON BIOPSY;  Surgeon: Daneil Dolin, MD;  Location: [facility];  Service: Endoscopy;  Laterality: N/A;   CHOLECYSTECTOMY     COLONOSCOPY WITH PROPOFOL N/A 05/15/2014   Dr. Gala Romney (primary GI is Dr. Oneida Alar). Hemorrhoids, negative random colon biopsies. GI pathogen panel negative. Diverticulosis.   complete hysterectomy     ESOPHAGOGASTRODUODENOSCOPY (EGD) WITH PROPOFOL N/A 05/15/2014   Dr. Gala Romney (primary GI Dr. 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   KNEE SURGERY Right    X 6-5 arthroscopies and open procedure 1 time- tighten patellar tendon and loose IT band   PANCREATECTOMY  11/2013   with splenectomy/hepaticojejunostomy and gastrojejunostomy.   PERIPHERAL VASCULAR INTERVENTION Right 06/04/2019   Procedure: PERIPHERAL VASCULAR INTERVENTION;  Surgeon: Waynetta Sandy, MD;  Location: [facility];  Service: Cardiovascular;  Laterality: Right;  common iliac   PERIPHERAL VASCULAR INTERVENTION Right 11/26/2019   Procedure: PERIPHERAL VASCULAR INTERVENTION;  Surgeon: Waynetta Sandy, MD;  Location: [facility];  Service: Cardiovascular;  Laterality: Right;  Iliac     Family Psychiatric History: Please see initial evaluation for  full details. I have reviewed the history. No updates at this time.     Family History:  Family History  Problem Relation Age of Onset   Pancreatic cancer Paternal Grandfather    Cirrhosis Paternal Grandfather    Heart attack Paternal Grandfather    Pancreatitis Other        cousin   Throat cancer Mother    Peripheral Artery Disease Mother    Depression Father    Pancreatitis Father    Colon cancer Neg Hx     Social History:  Social History   Socioeconomic History   Marital status: Married    Spouse name: Not on file   Number of children: 0   Years of education: Not on file   Highest education level: Not on file  Occupational History   Occupation: disabled  Tobacco Use   Smoking status: Former    Packs/day: 2.00    Years: 30.00    Pack years: 60.00    Types: Cigarettes    Quit date: 04/21/2020    Years since quitting: 1.3   Smokeless tobacco: Never   Tobacco comments:    1 cig daily  Vaping Use   Vaping Use: Some  days  Substance and Sexual Activity   Alcohol use: No    Alcohol/week: 0.0 standard drinks   Drug use: No   Sexual activity: Yes    Birth control/protection: None    Comment: Hysterectomy  Other Topics Concern   Not on file  Social History Narrative   Not on file   Social Determinants of Health   Financial Resource Strain: Not on file  Food Insecurity: Not on file  Transportation Needs: Not on file  Physical Activity: Not on file  Stress: Not on file  Social Connections: Not on file    Allergies:  Allergies  Allergen Reactions   Erythromycin Anaphylaxis, Hives and Nausea Only   Levofloxacin Other (See Comments)    Burning during administration IV prior to surgery.  Pt was told not to take it again.   Niaspan [Niacin] Anaphylaxis   Other Anaphylaxis and Other (See Comments)    Derma Bond severe rash   Penicillins Anaphylaxis    Has patient had a PCN reaction causing immediate rash, facial/tongue/throat swelling, SOB or lightheadedness  with hypotension: Yes Has patient had a PCN reaction causing severe rash involving mucus membranes or skin necrosis: No Has patient had a PCN reaction that required hospitalization: No Has patient had a PCN reaction occurring within the last 10 years: No If all of the above answers are "NO", then may proceed with Cephalosporin use.    Shellfish Allergy Anaphylaxis   Sulfa Antibiotics Anaphylaxis    "Blisters in my throat"   Wellbutrin [Bupropion] Other (See Comments)    Suicidal thoughts while on Lexapro   Morphine Other (See Comments)    ineffective   Strawberry Extract Hives   Sulfamethoxazole Other (See Comments)    Blister in throat, sores in mouth   Tape Other (See Comments)    Nicoderm Adhesive Patch cause blisters    Cefdinir     Unknown reaction    Clindamycin/Lincomycin Other (See Comments)    Caused C-Diff   Doxycycline Hives and Nausea Only    A 133m tablet formulation caused hives, nausea, vomiting. 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    Metabolic Disorder Labs: Lab Results  Component Value Date   HGBA1C 8.5 (H) 08/05/2020   MPG 197 08/05/2020   No results found for: PROLACTIN No results found for: CHOL, TRIG, HDL, CHOLHDL, VLDL, LDLCALC Lab Results  Component Value Date   TSH 2.54 08/19/2014   TSH 0.38 01/29/2014    Therapeutic Level Labs: No results found for: LITHIUM No results found for: VALPROATE No components found for:  CBMZ  Current Medications: Current Outpatient Medications  Medication Sig Dispense Refill   [START ON 09/10/2021] methylphenidate (RITALIN) 10 MG tablet Take 1 tablet (10 mg total) by mouth 2 (two) times daily. 60 tablet 0   [START ON 10/11/2021]  methylphenidate (RITALIN) 10 MG tablet Take 1 tablet (10 mg total) by mouth 2 (two) times daily. 60 tablet 0   QUEtiapine (SEROQUEL) 25 MG tablet Take 12.5 mg by mouth at bedtime.     clopidogrel (PLAVIX) 75 MG tablet Take 1 tablet (75 mg total) by mouth daily. (Patient taking differently: Take 75 mg by mouth in the  morning.) 90 tablet 3   EPIPEN 2-PAK 0.3 MG/0.3ML SOAJ injection Inject 0.3 mg into the skin daily as needed for anaphylaxis (allergic reaction).     escitalopram (LEXAPRO) 20 MG tablet Take 20 mg by mouth daily.     Evolocumab 140 MG/ML SOSY Inject 140 mg into the skin every 14 (fourteen) days. Repatha     glucose blood (PRECISION QID TEST) test strip 1 strip by external route three times daily One touch verio     insulin lispro (HUMALOG) 100 UNIT/ML injection continuous. INSULIN PUMP     levothyroxine (SYNTHROID) 50 MCG tablet Take 50 mcg by mouth See admin instructions. Take 1 tablet (50 mcg) by mouth on Mondays, Tuesdays, Wednesdays, Thursdays, Fridays & Saturdays     levothyroxine (SYNTHROID) 75 MCG tablet Take 75 mcg by mouth every Sunday.     methylphenidate (RITALIN) 10 MG tablet Take 1 tablet (10 mg total) by mouth 2 (two) times daily. 60 tablet 0   Multiple Vitamin (MULTIVITAMIN WITH MINERALS) TABS tablet Take 2 tablets by mouth daily. DEKA     NARCAN 4 MG/0.1ML LIQD nasal spray kit Place 1 spray into the nose once as needed for opioid reversal.     Nutritional Supplements (VIVONEX RTF) LIQD Take 1,250-1,750 mLs by mouth at bedtime. Additional at lunch     [facility] VERIO test strip 3 (three) times daily.     oxyCODONE (OXY IR/ROXICODONE) 5 MG immediate release tablet Take 1 tablet (5 mg total) by mouth in the morning, at noon, and at bedtime. (Patient taking differently: Take 10 mg by mouth in the morning, at noon, and at bedtime.) 20 tablet 0   Pancrelipase, Lip-Prot-Amyl, (PERTZYE) 16000-57500 units CPEP Take 1 tablet by mouth 3 (three) times daily as needed (with real  meals/snack (nutrition not by feeding tube)). Only when eat or snack     pantoprazole (PROTONIX) 40 MG tablet Take 80 mg by mouth at bedtime.      promethazine (PHENERGAN) 12.5 MG tablet Take 1 tablet (12.5 mg total) by mouth every 6 (six) hours as needed for nausea or vomiting. 30 tablet 0   ramelteon (ROZEREM) 8 MG tablet Take 1 tablet (8 mg total) by mouth at bedtime. 30 tablet 0   senna (SENOKOT) 8.6 MG TABS tablet Take 1 tablet by mouth daily as needed for mild constipation.     Vitamin D, Ergocalciferol, (DRISDOL) 1.25 MG (50000 UNIT) CAPS capsule Take 50,000 Units by mouth every Monday, Wednesday, and Friday.     No current facility-administered medications for this visit.     Musculoskeletal: Strength & Muscle Tone:  N/A Gait & Station:  N/A Patient leans: N/A  Psychiatric Specialty Exam: Review of Systems  Psychiatric/Behavioral:  Positive for sleep disturbance. Negative for agitation, behavioral problems, confusion, decreased concentration, dysphoric mood, hallucinations, self-injury and suicidal ideas. The patient is nervous/anxious. The patient is not hyperactive.   All other systems reviewed and are negative.  There were no vitals taken for this visit.There is no height or weight on file to calculate BMI.  General Appearance: Fairly Groomed  Eye Contact:  Good  Speech:  Clear and Coherent  Volume:  Normal  Mood:   good  Affect:  Appropriate, Congruent, and calm  Thought Process:  Coherent  Orientation:  Full (Time, Place, and Person)  Thought Content: Logical   Suicidal Thoughts:  No  Homicidal Thoughts:  No  Memory:  Immediate;   Good  Judgement:  Good  Insight:  Good  Psychomotor Activity:  Normal  Concentration:  Concentration: Good and Attention Span: Good  Recall:  Good  Fund of Knowledge: Good  Language: Good  Akathisia:  No  Handed:  Right  AIMS (if indicated): not done  Assets:  Communication Skills Desire for Improvement  ADL's:  Intact  Cognition:  WNL  Sleep:  Poor   Screenings: PHQ2-9    Flowsheet Row Video Visit from 08/03/2021 in [facility] Video Visit from 03/30/2021 in [facility] Video Visit from 11/04/2020 in [facility] Video Visit from 09/02/2020 in [facility]  PHQ-2 Total Score 1 0 1 0      Flowsheet Row Video Visit from 03/30/2021 in [facility] Admission (Discharged) from 11/05/2020 in [facility] Video Visit from 11/04/2020 in [facility] No Risk No Risk No Risk        Assessment and Plan:  Jessica [name] is a 46 y.o. year old female with a history of ADHD, depression,  hashimoto's thyroiditis, AIH/Hereditary pancreatitis s/p islet cell transplant, diabetes, s/p Roux-en- Y bypass surgery, left lower extremity bypass with a subsequent balloon angioplasty, who presents for follow up appointment for below.   1. MDD (major depressive disorder), recurrent, in partial remission ([place]) 2. Insomnia, unspecified type Although there has been more improvement in her mood symptoms, she continues to experience anxiety and insomnia due to this. Psychosocial stressors includes loss of her mother in December.  Will start quetiapine as adjunctive treatment for depression, and also to target anxiety and insomnia.  Discussed potential metabolic side effect and EPS.  Will continue Lexapro to target depression and anxiety.   3. Attention deficit hyperactivity disorder (ADHD), unspecified ADHD type She reports good benefit from Ritalin.  We will continue current dose to target ADHD.    This clinician has discussed the side effect associated with medication prescribed during this encounter. Please refer to notes in the previous encounters for more details.     Plan Continue lexapro 20 mg daily  Continue Ritalin 10 mg in the a.m.,  10 mg at lunchtime Start quetiapine 12.5 mg at night (previously filled) Next appointment-5/1 at 2 Pm for 30 mins, video   Past trials of medication: sertraline, fluoxetine, Paxil, citalopram, lexapro, bupropion (voices), venlafaxine, duloxetine, trintellix, mirtazapine, trazodone, amitriptyline, doxepin (insomnia), lamotrigine, Geodon, olanzapine, quetiapine, Vraylar, xanax, clonazepam, temazepam, ritalin, trazodone, Ambien, Rozerem, Lunesta, Belsomra, benadryl, Concerta (palpitation at 36 mg)   The patient demonstrates the following risk factors for suicide: Chronic risk factors for suicide include: psychiatric disorder of depression and medical illness pancreatitis. Acute risk factors for suicide include: unemployment. Protective factors for this patient include: positive social support, coping skills and hope for the future. Considering these factors, the overall suicide risk at this point appears to be low. Patient is appropriate for outpatient follow up.     Collaboration of Care: Collaboration of Care: Other N/A  Consent: Patient/Guardian gives verbal consent for treatment and assignment of benefits for services provided during this visit. Patient/Guardian expressed understanding and agreed to proceed.   Discussed with the patient about upcoming regulatory changes in Telehealth. The patient verbalized understanding that there is a change in regulations which requires the patient to come for in person visit for continuity of care after May 11 th.    Norman Clay, MD 08/31/2021, 11:25 AM

## 2021-08-31 ENCOUNTER — Telehealth (INDEPENDENT_AMBULATORY_CARE_PROVIDER_SITE_OTHER): Payer: Self-pay | Admitting: Psychiatry

## 2021-08-31 ENCOUNTER — Encounter: Payer: Self-pay | Admitting: Psychiatry

## 2021-08-31 ENCOUNTER — Other Ambulatory Visit: Payer: Self-pay

## 2021-08-31 DIAGNOSIS — F3341 Major depressive disorder, recurrent, in partial remission: Secondary | ICD-10-CM

## 2021-08-31 DIAGNOSIS — G4700 Insomnia, unspecified: Secondary | ICD-10-CM

## 2021-08-31 DIAGNOSIS — F909 Attention-deficit hyperactivity disorder, unspecified type: Secondary | ICD-10-CM

## 2021-08-31 MED ORDER — METHYLPHENIDATE HCL 10 MG PO TABS: 10.0000 mg | ORAL_TABLET | Freq: Two times a day (BID) | ORAL | 0 refills | Status: AC

## 2021-08-31 NOTE — Patient Instructions (Signed)
Continue lexapro 20 mg daily  ?Continue Ritalin 10 mg in the a.m., 10 mg at lunchtime ?Start quetiapine 12.5 mg at night ?Next appointment-5/1 at 2 PM, video ?

## 2021-10-17 NOTE — Progress Notes (Signed)
Virtual Visit via Video Note ? ?I connected with Jessica [name] on 10/19/21 at  2:00 PM EDT by a video enabled telemedicine application and verified that I am speaking with the correct person using two identifiers. ? ?Location: ?Patient: car ?Provider: office ?Persons participated in the visit- patient, provider  ?  ?I discussed the limitations of evaluation and management by telemedicine and the availability of in person appointments. The patient expressed understanding and agreed to proceed. ?  ?I discussed the assessment and treatment plan with the patient. The patient was provided an opportunity to ask questions and all were answered. The patient agreed with the plan and demonstrated an understanding of the instructions. ?  ?The patient was advised to call back or seek an in-person evaluation if the symptoms worsen or if the condition fails to improve as anticipated. ? ?I provided 21 minutes of non-face-to-face time during this encounter. ? ? ?Jessica Hotter, MD ? ? ? ?BH MD/PA/NP OP Progress Note ? ?10/19/2021 2:42 PM ?Jessica [name]  ?MRN:  [id number] ? ?Chief Complaint:  ?Chief Complaint  ?Patient presents with  ? Follow-up  ? ?HPI:  ?This is a follow-up appointment for depression and ADHD.  ?She states that her friend passed away from drug overdose.  She was a good girl. She states that she had a panic attack when she was trying to attend her friend's funeral.  She thinks it is connected with the loss of her mother, stating that there is her mother's birthday, and Mother's Day.  Although she thinks about her, it is not affecting her as much.  She also has been talking about her mother with her family.  She also talks with her mother.  She enjoys time on painting.  She is doing well at work except that she tends to have difficulty in concentration around 4 PM.  She has initial and middle insomnia.  She could not continue quetiapine due to fatigue, although it did not help for insomnia.  She denies feeling  depressed.  She denies change in appetite.  She denies SI.  She denies anxiety except she had a panic attack.  She is willing to try medication change as below.  ? ?Support: 2 sisters ?Employed: pharmacy tech ?Marital status: married ?Number of children: 0 ? ?Visit Diagnosis:  ?  ICD-10-CM   ?1. MDD (major depressive disorder), recurrent, in partial remission (HCC)  F33.41   ?  ?2. Attention deficit hyperactivity disorder (ADHD), unspecified ADHD type  F90.9   ?  ?3. Insomnia, unspecified type  G47.00   ?  ? ? ?Past Psychiatric History: Please see initial evaluation for full details. I have reviewed the history. No updates at this time.  ?  ? ?Past Medical History:  ?Past Medical History:  ?Diagnosis Date  ? ADHD (attention deficit hyperactivity disorder)   ? Anxiety   ? Arthritis   ? mid back  ? Diabetes mellitus type I (HCC)   ? Diverticulosis of colon without hemorrhage   ? DM (diabetes mellitus) (HCC) 11/2013  ? post pancreatectomy - insulin pump - humalog  ? Feeding by G-tube [facility])   ? GERD (gastroesophageal reflux disease)   ? Hashimoto's thyroiditis   ? euthyroid  ? Hypercholesterolemia   ? Hypothyroidism   ? PONV (postoperative nausea and vomiting)   ?  ?Past Surgical History:  ?Procedure Laterality Date  ? ABDOMINAL AORTOGRAM W/LOWER EXTREMITY N/A 04/27/2017  ? Procedure: ABDOMINAL AORTOGRAM W/LOWER EXTREMITY;  Surgeon: Maeola Harman, MD;  Location: [facility] IN[facility];  Service: Cardiovascular;  Laterality: N/A;  ? ABDOMINAL AORTOGRAM W/LOWER EXTREMITY Bilateral 06/04/2019  ? Procedure: ABDOMINAL AORTOGRAM W/LOWER EXTREMITY;  Surgeon: Maeola Harmanain, Brandon Christopher, MD;  [facility];  Service: Cardiovascular;  Laterality: Bilateral;  ? ABDOMINAL AORTOGRAM W/LOWER EXTREMITY N/A 11/26/2019  ? Procedure: ABDOMINAL AORTOGRAM W/ Right LOWER EXTREMITY [place];  Surgeon: Maeola Harmanain, Brandon Christopher, MD;  [facility];  Service: Cardiovascular;  Laterality: N/A;  ? ABDOMINAL  AORTOGRAM W/LOWER EXTREMITY N/A 07/28/2020  ? Procedure: ABDOMINAL AORTOGRAM W/LOWER EXTREMITY;  Surgeon: Maeola Harmanain, Brandon Christopher, MD;  [facility];  Service: Cardiovascular;  Laterality: N/A;  ? ABDOMINAL HYSTERECTOMY    ? AMPUTATION Right 11/05/2020  ? Procedure: RIGHT SMALL TOE AMPUTATION;  Surgeon: Maeola Harmanain, Brandon Christopher, MD;  [facility];  Service: Vascular;  Laterality: Right;  ? AORTA - BILATERAL FEMORAL ARTERY BYPASS GRAFT Bilateral 08/07/2020  ? Procedure: AORTA BIFEMORAL BYPASS GRAFTING;  Surgeon: Maeola Harmanain, Brandon Christopher, MD;  [facility];  Service: Vascular;  Laterality: Bilateral;  ? BIOPSY N/A 05/15/2014  ? Procedure: GASTIC,  ASCENDING, AND DESCEDNING/SIGMOID  COLON BIOPSY;  Surgeon: Corbin Adeobert M Rourk, MD;  Location: [facility];  Service: Endoscopy;  Laterality: N/A;  ? CHOLECYSTECTOMY    ? COLONOSCOPY WITH PROPOFOL N/A 05/15/2014  ? Dr. Jena Gaussourk (primary GI is Dr. Darrick Pennafields). Hemorrhoids, negative random colon biopsies. GI pathogen panel negative. Diverticulosis.  ? complete hysterectomy    ? ESOPHAGOGASTRODUODENOSCOPY (EGD) WITH PROPOFOL N/A 05/15/2014  ? Dr. Jena Gaussourk (primary GI Dr. Darrick Pennafields), Billroth II, inflamed residual gastric mucosa, benign biopsies  ? FEMORAL-POPLITEAL BYPASS GRAFT    ? 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 failed.  ? KNEE SURGERY Right   ? X 6-5 arthroscopies and open procedure 1 time- tighten patellar tendon and loose IT band  ? PANCREATECTOMY  11/2013  ? with splenectomy/hepaticojejunostomy and gastrojejunostomy.  ? PERIPHERAL VASCULAR INTERVENTION Right 06/04/2019  ? Procedure: PERIPHERAL VASCULAR INTERVENTION;  Surgeon: Maeola Harmanain, Brandon Christopher, MD;  [facility];  Service: Cardiovascular;  Laterality: Right;  common iliac  ? PERIPHERAL VASCULAR INTERVENTION Right 11/26/2019  ? Procedure: PERIPHERAL VASCULAR INTERVENTION;  Surgeon: Maeola Harmanain, Brandon Christopher, MD;  [facility];  Service: Cardiovascular;  Laterality: Right;  Iliac ?   ? ? ?Family Psychiatric History: Please see initial evaluation for full details. I have reviewed the history. No updates at this time.  ? ? ?Family History:  ?Family History  ?Problem Relation Age of Onset  ? Pancreatic cancer Paternal Grandfather   ? Cirrhosis Paternal Grandfather   ? Heart attack Paternal Grandfather   ? Pancreatitis Other   ?     cousin  ? Throat cancer Mother   ? Peripheral Artery Disease Mother   ? Depression Father   ? Pancreatitis Father   ? Colon cancer Neg Hx   ? ? ?Social History:  ?Social History  ? ?Socioeconomic History  ? Marital status: Married  ?  Spouse name: Not on file  ? Number of children: 0  ? Years of education: Not on file  ? Highest education level: Not on file  ?Occupational History  ? Occupation: disabled  ?Tobacco Use  ? Smoking status: Former  ?  Packs/day: 2.00  ?  Years: 30.00  ?  Pack years: 60.00  ?  Types: Cigarettes  ?  Quit date: 04/21/2020  ?  Years since quitting: 1.4  ? Smokeless tobacco: Never  ? Tobacco comments:  ?  1 cig daily  ?Vaping Use  ? Vaping Use: Some days  ?Substance and Sexual Activity  ? Alcohol use: No  ?  Alcohol/week: 0.0 standard drinks  ? Drug use: No  ? Sexual activity: Yes  ?  Birth control/protection: None  ?  Comment: Hysterectomy  ?Other Topics Concern  ? Not on file  ?Social History Narrative  ? Not on file  ? ?Social Determinants of Health  ? ?Financial Resource Strain: Not on file  ?Food Insecurity: Not on file  ?Transportation Needs: Not on file  ?Physical Activity: Not on file  ?Stress: Not on file  ?Social Connections: Not on file  ? ? ?Allergies:  ?Allergies  ?Allergen Reactions  ? Erythromycin Anaphylaxis, Hives and Nausea Only  ? Levofloxacin Other (See Comments)  ?  Burning during administration IV prior to surgery.  Pt was told not to take it again.  ? Niaspan [Niacin] Anaphylaxis  ? Other Anaphylaxis and Other (See Comments)  ?  Derma Bond severe rash  ? Penicillins Anaphylaxis  ?  Has patient had a PCN reaction causing  immediate rash, facial/tongue/throat swelling, SOB or lightheadedness with hypotension: Yes ?Has patient had a PCN reaction causing severe rash involving mucus membranes or skin necrosis: No ?Has patient had a PCN reaction

## 2021-10-19 ENCOUNTER — Encounter: Payer: Self-pay | Admitting: Psychiatry

## 2021-10-19 ENCOUNTER — Telehealth (INDEPENDENT_AMBULATORY_CARE_PROVIDER_SITE_OTHER): Payer: Medicare HMO | Admitting: Psychiatry

## 2021-10-19 DIAGNOSIS — F3341 Major depressive disorder, recurrent, in partial remission: Secondary | ICD-10-CM | POA: Diagnosis not present

## 2021-10-19 DIAGNOSIS — G4700 Insomnia, unspecified: Secondary | ICD-10-CM

## 2021-10-19 DIAGNOSIS — F909 Attention-deficit hyperactivity disorder, unspecified type: Secondary | ICD-10-CM | POA: Diagnosis not present

## 2021-10-19 MED ORDER — METHYLPHENIDATE HCL 5 MG PO TABS: 5.0000 mg | ORAL_TABLET | Freq: Every day | ORAL | 0 refills | Status: AC

## 2021-10-19 MED ORDER — METHYLPHENIDATE HCL 5 MG PO TABS: 5.0000 mg | ORAL_TABLET | Freq: Every day | ORAL | 0 refills | Status: DC

## 2021-10-19 MED ORDER — RAMELTEON 8 MG PO TABS: 8.0000 mg | ORAL_TABLET | Freq: Every day | ORAL | 1 refills | Status: AC

## 2021-10-19 MED ORDER — METHYLPHENIDATE HCL 10 MG PO TABS: ORAL_TABLET | ORAL | 0 refills | Status: DC

## 2021-10-19 NOTE — Patient Instructions (Signed)
Continue lexapro 20 mg daily  ?Increase Ritalin 10 mg in the a.m., 15 mg at lunchtime  ?Start ramelteon 8 mg at night  ?Discontinue quetiapine ?Next appointment- 6/13 at 10 AM, in person ? ?The next visit will be in person visit. Please arrive 15 mins before the scheduled time.  ? ?[facility]  ?Address: [address], [place], [place] [address]   ?

## 2021-10-28 ENCOUNTER — Telehealth: Payer: Self-pay

## 2021-10-28 NOTE — Telephone Encounter (Signed)
According to pharmacy it would not even if it was approved it could cost her $75.00 for the tier it is in. ?

## 2021-10-28 NOTE — Telephone Encounter (Signed)
No option at this time unless she wants to be back on any medication.

## 2021-10-28 NOTE — Telephone Encounter (Signed)
What can she take to help her sleep? ? ?

## 2021-10-28 NOTE — Telephone Encounter (Signed)
Returned pt's call. LVM to call us back if she still needs assistance.  ?

## 2021-10-28 NOTE — Telephone Encounter (Signed)
left message that no options at this time unless she wants to go back on any of the tried medications.  ?

## 2021-10-28 NOTE — Telephone Encounter (Signed)
pt insurance does not cover the rozerem. please send in something else.  ?

## 2021-10-28 NOTE — Telephone Encounter (Signed)
Could you update on the patient about above. She has tried most of the medication for sleep- will plan to hold starting any medication for sleep at this time.

## 2021-10-28 NOTE — Telephone Encounter (Signed)
Is there any way we can to prior authorization for that medication?

## 2021-11-26 NOTE — Progress Notes (Deleted)
[facility] MD/PA/NP OP Progress Note  11/26/2021 9:36 AM Jessica [name]  MRN:  [id number]  Chief Complaint: No chief complaint on file.  HPI: *** Visit Diagnosis: No diagnosis found.  Past Psychiatric History: Please see initial evaluation for full details. I have reviewed the history. No updates at this time.     Past Medical History:  Past Medical History:  Diagnosis Date   ADHD (attention deficit hyperactivity disorder)    Anxiety    Arthritis    mid back   Diabetes mellitus type I ([place])    Diverticulosis of colon without hemorrhage    DM (diabetes mellitus) ([place]) 11/2013   post pancreatectomy - insulin pump - humalog   Feeding by G-tube ([place])    GERD (gastroesophageal reflux disease)    Hashimoto's thyroiditis    euthyroid   Hypercholesterolemia    Hypothyroidism    PONV (postoperative nausea and vomiting)     Past Surgical History:  Procedure Laterality Date   ABDOMINAL AORTOGRAM W/LOWER EXTREMITY N/A 04/27/2017   Procedure: ABDOMINAL AORTOGRAM W/LOWER EXTREMITY;  Surgeon: Waynetta Sandy, MD;  Location: [facility];  Service: Cardiovascular;  Laterality: N/A;   ABDOMINAL AORTOGRAM W/LOWER EXTREMITY Bilateral 06/04/2019   Procedure: ABDOMINAL AORTOGRAM W/LOWER EXTREMITY;  Surgeon: Waynetta Sandy, MD;  Location: [facility];  Service: Cardiovascular;  Laterality: Bilateral;   ABDOMINAL AORTOGRAM W/LOWER EXTREMITY N/A 11/26/2019   Procedure: ABDOMINAL AORTOGRAM W/ Right LOWER EXTREMITY [place];  Surgeon: Waynetta Sandy, MD;  Location: [facility];  Service: Cardiovascular;  Laterality: N/A;   ABDOMINAL AORTOGRAM W/LOWER EXTREMITY N/A 07/28/2020   Procedure: ABDOMINAL AORTOGRAM W/LOWER EXTREMITY;  Surgeon: Waynetta Sandy, MD;  Location: [facility];  Service: Cardiovascular;  Laterality: N/A;   ABDOMINAL HYSTERECTOMY     AMPUTATION Right 11/05/2020   Procedure: RIGHT SMALL TOE AMPUTATION;  Surgeon: Waynetta Sandy, MD;  Location: [facility];  Service: Vascular;  Laterality: Right;   AORTA - BILATERAL FEMORAL ARTERY BYPASS GRAFT Bilateral 08/07/2020   Procedure: AORTA BIFEMORAL BYPASS GRAFTING;  Surgeon: Waynetta Sandy, MD;  Location: [facility];  Service: Vascular;  Laterality: Bilateral;   BIOPSY N/A 05/15/2014   Procedure: GASTIC,  ASCENDING, AND DESCEDNING/SIGMOID  COLON BIOPSY;  Surgeon: Daneil Dolin, MD;  Location: [facility];  Service: Endoscopy;  Laterality: N/A;   CHOLECYSTECTOMY     COLONOSCOPY WITH PROPOFOL N/A 05/15/2014   Dr. Gala Romney (primary GI is Dr. Oneida Alar). Hemorrhoids, negative random colon biopsies. GI pathogen panel negative. Diverticulosis.   complete hysterectomy     ESOPHAGOGASTRODUODENOSCOPY (EGD) WITH PROPOFOL N/A 05/15/2014   Dr. Gala Romney (primary GI Dr. 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   KNEE SURGERY Right    X 6-5 arthroscopies and open procedure 1 time- tighten patellar tendon and loose IT band   PANCREATECTOMY  11/2013   with splenectomy/hepaticojejunostomy and gastrojejunostomy.   PERIPHERAL VASCULAR INTERVENTION Right 06/04/2019   Procedure: PERIPHERAL VASCULAR INTERVENTION;  Surgeon: Waynetta Sandy, MD;  Location: [facility];  Service: Cardiovascular;  Laterality: Right;  common iliac   PERIPHERAL VASCULAR INTERVENTION Right 11/26/2019   Procedure: PERIPHERAL VASCULAR INTERVENTION;  Surgeon: Waynetta Sandy, MD;  Location: [facility];  Service: Cardiovascular;  Laterality: Right;  Iliac     Family Psychiatric History: Please see initial evaluation for full details. I have reviewed the  history. No updates at this time.     Family History:  Family History  Problem Relation Age of Onset   Pancreatic cancer Paternal Grandfather    Cirrhosis Paternal Grandfather    Heart attack Paternal Grandfather    Pancreatitis  Other        cousin   Throat cancer Mother    Peripheral Artery Disease Mother    Depression Father    Pancreatitis Father    Colon cancer Neg Hx     Social History:  Social History   Socioeconomic History   Marital status: Married    Spouse name: Not on file   Number of children: 0   Years of education: Not on file   Highest education level: Not on file  Occupational History   Occupation: disabled  Tobacco Use   Smoking status: Former    Packs/day: 2.00    Years: 30.00    Total pack years: 60.00    Types: Cigarettes    Quit date: 04/21/2020    Years since quitting: 1.6   Smokeless tobacco: Never   Tobacco comments:    1 cig daily  Vaping Use   Vaping Use: Some days  Substance and Sexual Activity   Alcohol use: No    Alcohol/week: 0.0 standard drinks of alcohol   Drug use: No   Sexual activity: Yes    Birth control/protection: None    Comment: Hysterectomy  Other Topics Concern   Not on file  Social History Narrative   Not on file   Social Determinants of Health   Financial Resource Strain: Not on file  Food Insecurity: Not on file  Transportation Needs: Not on file  Physical Activity: Not on file  Stress: Not on file  Social Connections: Not on file    Allergies:  Allergies  Allergen Reactions   Erythromycin Anaphylaxis, Hives and Nausea Only   Levofloxacin Other (See Comments)    Burning during administration IV prior to surgery.  Pt was told not to take it again.   Niaspan [Niacin] Anaphylaxis   Other Anaphylaxis and Other (See Comments)    Derma Bond severe rash   Penicillins Anaphylaxis    Has patient had a PCN reaction causing immediate rash, facial/tongue/throat swelling, SOB or lightheadedness with hypotension: Yes Has patient had a PCN reaction causing severe rash involving mucus membranes or skin necrosis: No Has patient had a PCN reaction that required hospitalization: No Has patient had a PCN reaction occurring within the last 10 years:  No If all of the above answers are "NO", then may proceed with Cephalosporin use.    Shellfish Allergy Anaphylaxis   Sulfa Antibiotics Anaphylaxis    "Blisters in my throat"   Wellbutrin [Bupropion] Other (See Comments)    Suicidal thoughts while on Lexapro   Morphine Other (See Comments)    ineffective   Strawberry Extract Hives   Sulfamethoxazole Other (See Comments)    Blister in throat, sores in mouth   Tape Other (See Comments)    Nicoderm Adhesive Patch cause blisters    Cefdinir     Unknown reaction    Clindamycin/Lincomycin Other (See Comments)    Caused C-Diff   Doxycycline Hives and Nausea Only    A 144m tablet formulation caused hives, nausea, vomiting. 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    Metabolic Disorder Labs: Lab Results  Component Value Date   HGBA1C 8.5 (H) 08/05/2020   MPG 197 08/05/2020   No results found for: "PROLACTIN" No results found for: "CHOL", "TRIG", "HDL", "CHOLHDL", "VLDL", "LDLCALC" Lab Results  Component Value Date   TSH 2.54 08/19/2014   TSH 0.38 01/29/2014    Therapeutic Level Labs: No results found for: "LITHIUM" No results found for: "VALPROATE" No results found for: "CBMZ"  Current Medications: Current Outpatient Medications  Medication Sig Dispense Refill   EPIPEN 2-PAK 0.3 MG/0.3ML SOAJ injection Inject 0.3 mg into the skin daily as needed for anaphylaxis (allergic reaction).     escitalopram (LEXAPRO) 20 MG tablet Take 20 mg by mouth daily.     Evolocumab 140 MG/ML SOSY Inject 140 mg into the skin every 14 (fourteen) days. Repatha     glucose blood (PRECISION QID TEST) test strip 1 strip by external route three times daily One touch verio      insulin lispro (HUMALOG) 100 UNIT/ML injection continuous. INSULIN PUMP     levothyroxine (SYNTHROID) 50 MCG tablet Take 50 mcg by mouth See admin instructions. Take 1 tablet (50 mcg) by mouth on Mondays, Tuesdays, Wednesdays, Thursdays, Fridays & Saturdays     levothyroxine (SYNTHROID) 75 MCG tablet Take 75 mcg by mouth every Sunday.     methylphenidate (RITALIN) 10 MG tablet Take 1 tablet (10 mg total) by mouth 2 (two) times daily. 60 tablet 0   methylphenidate (RITALIN) 10 MG tablet Take 1 tablet (10 mg total) by mouth 2 (two) times daily. 60 tablet 0   methylphenidate (RITALIN) 10 MG tablet Take 1 tablet (10 mg total) by mouth 2 (two) times daily. 60 tablet 0   methylphenidate (RITALIN) 10 MG tablet 10 mg in the morning, 15 mg at lunch time (take along with 5 mg tab) 60 tablet 0   methylphenidate (RITALIN) 5 MG tablet Take 1 tablet (5 mg total) by mouth daily at 12 noon. Total of 15 mg at lunch time. Take along with 10 mg tab 30 tablet 0   methylphenidate (RITALIN) 5 MG tablet Take 1 tablet (5 mg total) by mouth daily at 12 noon. Total of 15 mg at lunch time (take along with 10 mg tab) 30 tablet 0   Multiple Vitamin (MULTIVITAMIN WITH MINERALS) TABS tablet Take 2 tablets by mouth daily. DEKA     NARCAN 4 MG/0.1ML LIQD nasal spray kit Place 1 spray into the nose once as needed for opioid reversal.     Nutritional Supplements (VIVONEX RTF) LIQD Take 1,250-1,750 mLs by mouth at bedtime. Additional at lunch     [facility] VERIO test strip 3 (three) times daily.     oxyCODONE (OXY IR/ROXICODONE) 5 MG immediate release tablet Take 1 tablet (5 mg total) by mouth in the morning, at noon, and at bedtime. (Patient taking differently: Take 10 mg by mouth in the morning, at noon, and at bedtime.) 20 tablet 0   Pancrelipase, Lip-Prot-Amyl, (PERTZYE) 16000-57500 units CPEP Take 1 tablet by mouth 3 (three) times daily as needed (with real meals/snack (nutrition not by feeding tube)). Only when eat or snack      pantoprazole (PROTONIX) 40 MG tablet Take 80 mg by mouth at bedtime.      promethazine (PHENERGAN) 12.5 MG tablet Take 1 tablet (12.5 mg total) by mouth every  6 (six) hours as needed for nausea or vomiting. 30 tablet 0   ramelteon (ROZEREM) 8 MG tablet Take 1 tablet (8 mg total) by mouth at bedtime. 30 tablet 0   ramelteon (ROZEREM) 8 MG tablet Take 1 tablet (8 mg total) by mouth at bedtime. 30 tablet 1   senna (SENOKOT) 8.6 MG TABS tablet Take 1 tablet by mouth daily as needed for mild constipation.     Vitamin D, Ergocalciferol, (DRISDOL) 1.25 MG (50000 UNIT) CAPS capsule Take 50,000 Units by mouth every Monday, Wednesday, and Friday.     No current facility-administered medications for this visit.     Musculoskeletal: Strength & Muscle Tone: within normal limits Gait & Station: normal Patient leans: N/A  Psychiatric Specialty Exam: Review of Systems  There were no vitals taken for this visit.There is no height or weight on file to calculate BMI.  General Appearance: {Appearance:22683}  Eye Contact:  {BHH EYE CONTACT:22684}  Speech:  Clear and Coherent  Volume:  Normal  Mood:  {BHH MOOD:22306}  Affect:  {Affect (PAA):22687}  Thought Process:  Coherent  Orientation:  Full (Time, Place, and Person)  Thought Content: Logical   Suicidal Thoughts:  {ST/HT (PAA):22692}  Homicidal Thoughts:  {ST/HT (PAA):22692}  Memory:  Immediate;   Good  Judgement:  {Judgement (PAA):22694}  Insight:  {Insight (PAA):22695}  Psychomotor Activity:  Normal  Concentration:  Concentration: Good and Attention Span: Good  Recall:  Good  Fund of Knowledge: Good  Language: Good  Akathisia:  No  Handed:  Right  AIMS (if indicated): not done  Assets:  Communication Skills Desire for Improvement  ADL's:  Intact  Cognition: WNL  Sleep:  {BHH GOOD/FAIR/POOR:22877}   Screenings: PHQ2-9    Flowsheet Row Video Visit from 08/03/2021 in [facility] Video Visit from 03/30/2021 in  [facility] Video Visit from 11/04/2020 in [facility] Video Visit from 09/02/2020 in [facility]  PHQ-2 Total Score 1 0 1 0      Flowsheet Row Video Visit from 03/30/2021 in [facility] Ad[facility] (Discharged) from 11/05/2020 in [facility] Video Visit from 11/04/2020 in [facility] No Risk No Risk No Risk        Assessment and Plan:  Jessica [name] is a 46 y.o. year old female with a history of ADHD, depression,  hashimoto's thyroiditis, AIH/Hereditary pancreatitis s/p islet cell transplant, diabetes, s/p Roux-en- Y bypass surgery, left lower extremity bypass with a subsequent balloon angioplasty, who presents for follow up appointment for below.    1. MDD (major depressive disorder), recurrent, in partial remission ([place]) Although she reports panic attacks in the context of trying to attend her friend's funeral, she denies any other significant mood symptoms otherwise. Psychosocial stressors includes loss of her mother in December.  She could not tolerate quetiapine due to drowsiness.  Will hold this medication.  Will continue Lexapro to target depression and anxiety.    2. Attention deficit hyperactivity disorder (ADHD), unspecified ADHD type She has had more difficulty in attention later in the day.  Will try higher dose of Ritalin at lunch time to see if it helps for ADHD symptoms.    3. Insomnia, unspecified type She could not tolerate quetiapine.  Will try ramelteon to target insomnia.  Discussed potential risk of drowsiness.    This clinician has discussed the side effect associated with medication prescribed during this encounter. Please refer to  notes in the previous encounters for more details.     Plan Continue lexapro 20 mg daily  Increase Ritalin 10 mg in the a.m., 15 mg at lunchtime (was on 10 mg  BID) Start ramelteon 8 mg at night  Discontinue quetiapine Next appointment- 6/13 at 10 AM for 30 mins, in person.  She verbalized understanding that she cannot be seen for a video visit given she lives in [place].    Past trials of medication: sertraline, fluoxetine, Paxil, citalopram, lexapro, bupropion (voices), venlafaxine, duloxetine, trintellix, mirtazapine, trazodone, amitriptyline, doxepin (insomnia), lamotrigine, Geodon, olanzapine, quetiapine (drowsiness), Vraylar, xanax, clonazepam, temazepam, ritalin, trazodone, Ambien, Rozerem, Lunesta, Belsomra, benadryl, Concerta (palpitation at 36 mg)   The patient demonstrates the following risk factors for suicide: Chronic risk factors for suicide include: psychiatric disorder of depression and medical illness pancreatitis. Acute risk factors for suicide include: unemployment. Protective factors for this patient include: positive social support, coping skills and hope for the future. Considering these factors, the overall suicide risk at this point appears to be low. Patient is appropriate for outpatient follow up.      Collaboration of Care: Collaboration of Care: {BH OP Collaboration of Care:21014065}  Patient/Guardian was advised Release of Information must be obtained prior to any record release in order to collaborate their care with an outside provider. Patient/Guardian was advised if they have not already done so to contact the registration department to sign all necessary forms in order for [place] to release information regarding their care.   Consent: Patient/Guardian gives verbal consent for treatment and assignment of benefits for services provided during this visit. Patient/Guardian expressed understanding and agreed to proceed.    Norman Clay, MD 11/26/2021, 9:36 AM

## 2021-11-27 ENCOUNTER — Other Ambulatory Visit: Payer: Self-pay

## 2021-11-27 NOTE — Telephone Encounter (Signed)
Refill request received for Plavix, different pharmacy listed in chart, called pt to clarify, no answer, left vm

## 2021-12-01 ENCOUNTER — Ambulatory Visit: Payer: Medicare HMO | Admitting: Psychiatry

## 2021-12-15 ENCOUNTER — Telehealth: Payer: Self-pay

## 2021-12-16 ENCOUNTER — Other Ambulatory Visit: Payer: Self-pay | Admitting: Psychiatry

## 2021-12-16 MED ORDER — METHYLPHENIDATE HCL 5 MG PO TABS: 5.0000 mg | ORAL_TABLET | Freq: Every day | ORAL | 0 refills | Status: AC

## 2021-12-16 MED ORDER — METHYLPHENIDATE HCL 10 MG PO TABS: ORAL_TABLET | ORAL | 0 refills | Status: AC

## 2021-12-16 NOTE — Telephone Encounter (Signed)
left message to call office back with the [facility] pharmacy she wanted to use.

## 2021-12-16 NOTE — Telephone Encounter (Signed)
Ordered

## 2021-12-16 NOTE — Telephone Encounter (Signed)
Noted, thanks!

## 2021-12-16 NOTE — Telephone Encounter (Signed)
Medication refills - Telephone call with patient, after she left another message requesting a new Methylphenidate 10 mg order be sent into her [facility] in [place], [place]. Patient stated she would need the 5mg  as well prior to next appt on 710/23 if Dr.Hisada would also send this to fill when due.  Agreed to send the requests to Dr. 10-18-1976 this date.

## 2021-12-17 NOTE — Telephone Encounter (Signed)
Medication management - Message left for pt. that Dr. Vanetta Shawl had sent in her requested new order or methylphenidate to the [facility] in [place], [place].

## 2021-12-24 NOTE — Progress Notes (Deleted)
[facility] MD/PA/NP OP Progress Note  12/24/2021 4:14 PM Jessica [name]  MRN:  [id number]  Chief Complaint: No chief complaint on file.  HPI: *** Visit Diagnosis: No diagnosis found.  Past Psychiatric History: Please see initial evaluation for full details. I have reviewed the history. No updates at this time.     Past Medical History:  Past Medical History:  Diagnosis Date   ADHD (attention deficit hyperactivity disorder)    Anxiety    Arthritis    mid back   Diabetes mellitus type I ([place])    Diverticulosis of colon without hemorrhage    DM (diabetes mellitus) ([place]) 11/2013   post pancreatectomy - insulin pump - humalog   Feeding by G-tube ([place])    GERD (gastroesophageal reflux disease)    Hashimoto's thyroiditis    euthyroid   Hypercholesterolemia    Hypothyroidism    PONV (postoperative nausea and vomiting)     Past Surgical History:  Procedure Laterality Date   ABDOMINAL AORTOGRAM W/LOWER EXTREMITY N/A 04/27/2017   Procedure: ABDOMINAL AORTOGRAM W/LOWER EXTREMITY;  Surgeon: Waynetta Sandy, MD;  Location: [facility];  Service: Cardiovascular;  Laterality: N/A;   ABDOMINAL AORTOGRAM W/LOWER EXTREMITY Bilateral 06/04/2019   Procedure: ABDOMINAL AORTOGRAM W/LOWER EXTREMITY;  Surgeon: Waynetta Sandy, MD;  Location: [facility];  Service: Cardiovascular;  Laterality: Bilateral;   ABDOMINAL AORTOGRAM W/LOWER EXTREMITY N/A 11/26/2019   Procedure: ABDOMINAL AORTOGRAM W/ Right LOWER EXTREMITY [place];  Surgeon: Waynetta Sandy, MD;  Location: [facility];  Service: Cardiovascular;  Laterality: N/A;   ABDOMINAL AORTOGRAM W/LOWER EXTREMITY N/A 07/28/2020   Procedure: ABDOMINAL AORTOGRAM W/LOWER EXTREMITY;  Surgeon: Waynetta Sandy, MD;  Location: [facility];  Service: Cardiovascular;  Laterality: N/A;   ABDOMINAL HYSTERECTOMY     AMPUTATION Right 11/05/2020   Procedure: RIGHT SMALL TOE AMPUTATION;  Surgeon: Waynetta Sandy, MD;  Location: [facility];  Service: Vascular;  Laterality: Right;   AORTA - BILATERAL FEMORAL ARTERY BYPASS GRAFT Bilateral 08/07/2020   Procedure: AORTA BIFEMORAL BYPASS GRAFTING;  Surgeon: Waynetta Sandy, MD;  Location: [facility];  Service: Vascular;  Laterality: Bilateral;   BIOPSY N/A 05/15/2014   Procedure: GASTIC,  ASCENDING, AND DESCEDNING/SIGMOID  COLON BIOPSY;  Surgeon: Daneil Dolin, MD;  Location: [facility];  Service: Endoscopy;  Laterality: N/A;   CHOLECYSTECTOMY     COLONOSCOPY WITH PROPOFOL N/A 05/15/2014   Dr. Gala Romney (primary GI is Dr. Oneida Alar). Hemorrhoids, negative random colon biopsies. GI pathogen panel negative. Diverticulosis.   complete hysterectomy     ESOPHAGOGASTRODUODENOSCOPY (EGD) WITH PROPOFOL N/A 05/15/2014   Dr. Gala Romney (primary GI Dr. 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   KNEE SURGERY Right    X 6-5 arthroscopies and open procedure 1 time- tighten patellar tendon and loose IT band   PANCREATECTOMY  11/2013   with splenectomy/hepaticojejunostomy and gastrojejunostomy.   PERIPHERAL VASCULAR INTERVENTION Right 06/04/2019   Procedure: PERIPHERAL VASCULAR INTERVENTION;  Surgeon: Waynetta Sandy, MD;  Location: [facility];  Service: Cardiovascular;  Laterality: Right;  common iliac   PERIPHERAL VASCULAR INTERVENTION Right 11/26/2019   Procedure: PERIPHERAL VASCULAR INTERVENTION;  Surgeon: Waynetta Sandy, MD;  Location: [facility];  Service: Cardiovascular;  Laterality: Right;  Iliac     Family Psychiatric History: Please see initial evaluation for full details. I have reviewed the  history. No updates at this time.     Family History:  Family History  Problem Relation Age of Onset   Pancreatic cancer Paternal Grandfather    Cirrhosis Paternal Grandfather    Heart attack Paternal Grandfather    Pancreatitis  Other        cousin   Throat cancer Mother    Peripheral Artery Disease Mother    Depression Father    Pancreatitis Father    Colon cancer Neg Hx     Social History:  Social History   Socioeconomic History   Marital status: Married    Spouse name: Not on file   Number of children: 0   Years of education: Not on file   Highest education level: Not on file  Occupational History   Occupation: disabled  Tobacco Use   Smoking status: Former    Packs/day: 2.00    Years: 30.00    Total pack years: 60.00    Types: Cigarettes    Quit date: 04/21/2020    Years since quitting: 1.6   Smokeless tobacco: Never   Tobacco comments:    1 cig daily  Vaping Use   Vaping Use: Some days  Substance and Sexual Activity   Alcohol use: No    Alcohol/week: 0.0 standard drinks of alcohol   Drug use: No   Sexual activity: Yes    Birth control/protection: None    Comment: Hysterectomy  Other Topics Concern   Not on file  Social History Narrative   Not on file   Social Determinants of Health   Financial Resource Strain: Not on file  Food Insecurity: Not on file  Transportation Needs: Not on file  Physical Activity: Not on file  Stress: Not on file  Social Connections: Not on file    Allergies:  Allergies  Allergen Reactions   Erythromycin Anaphylaxis, Hives and Nausea Only   Levofloxacin Other (See Comments)    Burning during administration IV prior to surgery.  Pt was told not to take it again.   Niaspan [Niacin] Anaphylaxis   Other Anaphylaxis and Other (See Comments)    Derma Bond severe rash   Penicillins Anaphylaxis    Has patient had a PCN reaction causing immediate rash, facial/tongue/throat swelling, SOB or lightheadedness with hypotension: Yes Has patient had a PCN reaction causing severe rash involving mucus membranes or skin necrosis: No Has patient had a PCN reaction that required hospitalization: No Has patient had a PCN reaction occurring within the last 10 years:  No If all of the above answers are "NO", then may proceed with Cephalosporin use.    Shellfish Allergy Anaphylaxis   Sulfa Antibiotics Anaphylaxis    "Blisters in my throat"   Wellbutrin [Bupropion] Other (See Comments)    Suicidal thoughts while on Lexapro   Morphine Other (See Comments)    ineffective   Strawberry Extract Hives   Sulfamethoxazole Other (See Comments)    Blister in throat, sores in mouth   Tape Other (See Comments)    Nicoderm Adhesive Patch cause blisters    Cefdinir     Unknown reaction    Clindamycin/Lincomycin Other (See Comments)    Caused C-Diff   Doxycycline Hives and Nausea Only    A 129m tablet formulation caused hives, nausea, vomiting. 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    Metabolic Disorder Labs: Lab Results  Component Value Date   HGBA1C 8.5 (H) 08/05/2020   MPG 197 08/05/2020   No results found for: "PROLACTIN" No results found for: "CHOL", "TRIG", "HDL", "CHOLHDL", "VLDL", "LDLCALC" Lab Results  Component Value Date   TSH 2.54 08/19/2014   TSH 0.38 01/29/2014    Therapeutic Level Labs: No results found for: "LITHIUM" No results found for: "VALPROATE" No results found for: "CBMZ"  Current Medications: Current Outpatient Medications  Medication Sig Dispense Refill   EPIPEN 2-PAK 0.3 MG/0.3ML SOAJ injection Inject 0.3 mg into the skin daily as needed for anaphylaxis (allergic reaction).     escitalopram (LEXAPRO) 20 MG tablet Take 20 mg by mouth daily.     Evolocumab 140 MG/ML SOSY Inject 140 mg into the skin every 14 (fourteen) days. Repatha     glucose blood (PRECISION QID TEST) test strip 1 strip by external route three times daily One touch verio      insulin lispro (HUMALOG) 100 UNIT/ML injection continuous. INSULIN PUMP     levothyroxine (SYNTHROID) 50 MCG tablet Take 50 mcg by mouth See admin instructions. Take 1 tablet (50 mcg) by mouth on Mondays, Tuesdays, Wednesdays, Thursdays, Fridays & Saturdays     levothyroxine (SYNTHROID) 75 MCG tablet Take 75 mcg by mouth every Sunday.     methylphenidate (RITALIN) 10 MG tablet Take 1 tablet (10 mg total) by mouth 2 (two) times daily. 60 tablet 0   methylphenidate (RITALIN) 10 MG tablet Take 1 tablet (10 mg total) by mouth 2 (two) times daily. 60 tablet 0   methylphenidate (RITALIN) 10 MG tablet Take 1 tablet (10 mg total) by mouth 2 (two) times daily. 60 tablet 0   methylphenidate (RITALIN) 10 MG tablet 10 mg in the morning, 15 mg at lunch time (take along with 5 mg tab) 60 tablet 0   methylphenidate (RITALIN) 5 MG tablet Take 1 tablet (5 mg total) by mouth daily at 12 noon. Total of 15 mg at lunch time. Take along with 10 mg tab 30 tablet 0   methylphenidate (RITALIN) 5 MG tablet Take 1 tablet (5 mg total) by mouth daily at 12 noon. Total of 15 mg at lunch time (take along with 10 mg tab) 30 tablet 0   Multiple Vitamin (MULTIVITAMIN WITH MINERALS) TABS tablet Take 2 tablets by mouth daily. DEKA     NARCAN 4 MG/0.1ML LIQD nasal spray kit Place 1 spray into the nose once as needed for opioid reversal.     Nutritional Supplements (VIVONEX RTF) LIQD Take 1,250-1,750 mLs by mouth at bedtime. Additional at lunch     [facility] VERIO test strip 3 (three) times daily.     oxyCODONE (OXY IR/ROXICODONE) 5 MG immediate release tablet Take 1 tablet (5 mg total) by mouth in the morning, at noon, and at bedtime. (Patient taking differently: Take 10 mg by mouth in the morning, at noon, and at bedtime.) 20 tablet 0   Pancrelipase, Lip-Prot-Amyl, (PERTZYE) 16000-57500 units CPEP Take 1 tablet by mouth 3 (three) times daily as needed (with real meals/snack (nutrition not by feeding tube)). Only when eat or snack      pantoprazole (PROTONIX) 40 MG tablet Take 80 mg by mouth at bedtime.      promethazine (PHENERGAN) 12.5 MG tablet Take 1 tablet (12.5 mg total) by mouth every  6 (six) hours as needed for nausea or vomiting. 30 tablet 0   ramelteon (ROZEREM) 8 MG tablet Take 1 tablet (8 mg total) by mouth at bedtime. 30 tablet 0   ramelteon (ROZEREM) 8 MG tablet Take 1 tablet (8 mg total) by mouth at bedtime. 30 tablet 1   senna (SENOKOT) 8.6 MG TABS tablet Take 1 tablet by mouth daily as needed for mild constipation.     Vitamin D, Ergocalciferol, (DRISDOL) 1.25 MG (50000 UNIT) CAPS capsule Take 50,000 Units by mouth every Monday, Wednesday, and Friday.     No current facility-administered medications for this visit.     Musculoskeletal: Strength & Muscle Tone: within normal limits Gait & Station: normal Patient leans: N/A  Psychiatric Specialty Exam: Review of Systems  There were no vitals taken for this visit.There is no height or weight on file to calculate BMI.  General Appearance: {Appearance:22683}  Eye Contact:  {BHH EYE CONTACT:22684}  Speech:  Clear and Coherent  Volume:  Normal  Mood:  {BHH MOOD:22306}  Affect:  {Affect (PAA):22687}  Thought Process:  Coherent  Orientation:  Full (Time, Place, and Person)  Thought Content: Logical   Suicidal Thoughts:  {ST/HT (PAA):22692}  Homicidal Thoughts:  {ST/HT (PAA):22692}  Memory:  Immediate;   Good  Judgement:  {Judgement (PAA):22694}  Insight:  {Insight (PAA):22695}  Psychomotor Activity:  Normal  Concentration:  Concentration: Good and Attention Span: Good  Recall:  Good  Fund of Knowledge: Good  Language: Good  Akathisia:  No  Handed:  Right  AIMS (if indicated): not done  Assets:  Communication Skills Desire for Improvement  ADL's:  Intact  Cognition: WNL  Sleep:  {B[facility] GOOD/FAIR/POOR:22877}   Screenings: PHQ2-9    Flowsheet Row Video Visit from 08/03/2021 in [facility] Video Visit from 03/30/2021 in  [facility] Video Visit from 11/04/2020 in [facility] Video Visit from 09/02/2020 in [facility]  PHQ-2 Total Score 1 0 1 0      Flowsheet Row Video Visit from 03/30/2021 in [facility] Admission (Discharged) from 11/05/2020 in [facility] Video Visit from 11/04/2020 in [facility] No Risk No Risk No Risk        Assessment and Plan:  Jessica [name] is a 46 y.o. year old female with a history of ADHD, depression,  hashimoto's thyroiditis, AIH/Hereditary pancreatitis s/p islet cell transplant, diabetes, s/p Roux-en- Y bypass surgery, left lower extremity bypass with a subsequent balloon angioplasty, who presents for follow up appointment for below.    1. MDD (major depressive disorder), recurrent, in partial remission ([place]) Although she reports panic attacks in the context of trying to attend her friend's funeral, she denies any other significant mood symptoms otherwise. Psychosocial stressors includes loss of her mother in December.  She could not tolerate quetiapine due to drowsiness.  Will hold this medication.  Will continue Lexapro to target depression and anxiety.    2. Attention deficit hyperactivity disorder (ADHD), unspecified ADHD type She has had more difficulty in attention later in the day.  Will try higher dose of Ritalin at lunch time to see if it helps for ADHD symptoms.    3. Insomnia, unspecified type She could not tolerate quetiapine.  Will try ramelteon to target insomnia.  Discussed potential risk of drowsiness.    This clinician has discussed the side effect associated with medication prescribed during this encounter. Please refer to  notes in the previous encounters for more details.     Plan Continue lexapro 20 mg daily  Increase Ritalin 10 mg in the a.m., 15 mg at lunchtime (was on 10 mg  BID) Start ramelteon 8 mg at night  Discontinue quetiapine Next appointment- 6/13 at 10 AM for 30 mins, in person.  She verbalized understanding that she cannot be seen for a video visit given she lives in [place].    Past trials of medication: sertraline, fluoxetine, Paxil, citalopram, lexapro, bupropion (voices), venlafaxine, duloxetine, trintellix, mirtazapine, trazodone, amitriptyline, doxepin (insomnia), lamotrigine, Geodon, olanzapine, quetiapine (drowsiness), Vraylar, xanax, clonazepam, temazepam, ritalin, trazodone, Ambien, Rozerem, Lunesta, Belsomra, benadryl, Concerta (palpitation at 36 mg)   The patient demonstrates the following risk factors for suicide: Chronic risk factors for suicide include: psychiatric disorder of depression and medical illness pancreatitis. Acute risk factors for suicide include: unemployment. Protective factors for this patient include: positive social support, coping skills and hope for the future. Considering these factors, the overall suicide risk at this point appears to be low. Patient is appropriate for outpatient follow up.      Collaboration of Care: Collaboration of Care: {BH OP Collaboration of Care:21014065}  Patient/Guardian was advised Release of Information must be obtained prior to any record release in order to collaborate their care with an outside provider. Patient/Guardian was advised if they have not already done so to contact the registration department to sign all necessary forms in order for [place] to release information regarding their care.   Consent: Patient/Guardian gives verbal consent for treatment and assignment of benefits for services provided during this visit. Patient/Guardian expressed understanding and agreed to proceed.    Norman Clay, MD 12/24/2021, 4:14 PM

## 2021-12-25 ENCOUNTER — Telehealth: Payer: Self-pay

## 2021-12-25 DIAGNOSIS — I739 Peripheral vascular disease, unspecified: Secondary | ICD-10-CM

## 2021-12-25 DIAGNOSIS — I779 Disorder of arteries and arterioles, unspecified: Secondary | ICD-10-CM

## 2021-12-25 NOTE — Telephone Encounter (Addendum)
Pt called stating that her L leg, same one she had a bypass in, had pitting edema from the knee to the ankle. She stated that she was doing elevation, rest, and ice, with no success. She maintains a good pulse, but has pain in the middle of her L thigh that has been bothering her for a while and unsure if it's related.  Spoke with [facility] PA, reviewed pt's chart, returned pt's call, no answer, lf vm detailing a possible PA visit on 7/12 as requested, but we need [place]. Asked for a call back to arrange appts.  Pt called back and appts were made for [place] studies and PA visit. Confirmed understanding.

## 2021-12-28 ENCOUNTER — Ambulatory Visit: Payer: Medicare HMO | Admitting: Psychiatry

## 2021-12-28 DIAGNOSIS — T85598A Other mechanical complication of other gastrointestinal prosthetic devices, implants and grafts, initial encounter: Secondary | ICD-10-CM | POA: Insufficient documentation

## 2021-12-29 ENCOUNTER — Other Ambulatory Visit (HOSPITAL_COMMUNITY): Payer: Medicare HMO

## 2021-12-29 ENCOUNTER — Telehealth: Payer: Self-pay

## 2021-12-29 ENCOUNTER — Encounter (HOSPITAL_COMMUNITY): Payer: Medicare HMO

## 2021-12-29 NOTE — Telephone Encounter (Signed)
LVMTCB to r/s appts due to missed U/S appts. Advised pt to call back prior to coming to office tomorrow so we can r/s [place] appts and ov.

## 2021-12-30 ENCOUNTER — Ambulatory Visit: Payer: Medicare HMO

## 2022-01-25 NOTE — Progress Notes (Unsigned)
HISTORY AND PHYSICAL     CC:  follow up. Requesting Provider:  Ollen Bowl, MD  HPI: This is a 46 y.o. female who is here today for follow up for PAD.  Pt has hx of right common iliac artery stenting on 2 occasions by Dr. Donzetta Matters.  She also previously had a left femoral popliteal bypass performed in [place].  She underwent aortobifemoral bypass grafting on 08/07/2020 by Dr. Donzetta Matters for CLI.  Bypass required an intervention in the remote past also by Dr. Donzetta Matters.  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    She most recently underwent transmetatarsal amputation of right small toe for non healing ulcer.   The pt returns today for follow up.  ***  The pt is not on a statin for cholesterol management.    The pt is not on an aspirin.    Other AC:  *** The pt is not on medication for hypertension.  The pt does  have diabetes. Tobacco hx:  former  Pt does *** have family hx of AAA.  Past Medical History:  Diagnosis Date   ADHD (attention deficit hyperactivity disorder)    Anxiety    Arthritis    mid back   Diabetes mellitus type I ([place])    Diverticulosis of colon without hemorrhage    DM (diabetes mellitus) ([place]) 11/2013   post pancreatectomy - insulin pump - humalog   Feeding by G-tube ([place])    GERD (gastroesophageal reflux disease)    Hashimoto's thyroiditis    euthyroid   Hypercholesterolemia    Hypothyroidism    PONV (postoperative nausea and vomiting)     Past Surgical History:  Procedure Laterality Date   ABDOMINAL AORTOGRAM W/LOWER EXTREMITY N/A 04/27/2017   Procedure: ABDOMINAL AORTOGRAM W/LOWER EXTREMITY;  Surgeon: Waynetta Sandy, MD;  Location: [facility];  Service: Cardiovascular;  Laterality: N/A;   ABDOMINAL AORTOGRAM W/LOWER EXTREMITY Bilateral 06/04/2019   Procedure: ABDOMINAL AORTOGRAM W/LOWER EXTREMITY;  Surgeon: Waynetta Sandy, MD;  Location: [facility];  Service: Cardiovascular;  Laterality: Bilateral;   ABDOMINAL AORTOGRAM W/LOWER EXTREMITY N/A 11/26/2019   Procedure: ABDOMINAL AORTOGRAM W/ Right LOWER EXTREMITY [place];  Surgeon: Waynetta Sandy, MD;  Location: [facility];  Service: Cardiovascular;  Laterality: N/A;   ABDOMINAL AORTOGRAM W/LOWER EXTREMITY N/A 07/28/2020   Procedure: ABDOMINAL AORTOGRAM W/LOWER EXTREMITY;  Surgeon: Waynetta Sandy, MD;  Location: [facility];  Service: Cardiovascular;  Laterality: N/A;   ABDOMINAL HYSTERECTOMY     AMPUTATION Right 11/05/2020   Procedure: RIGHT SMALL TOE AMPUTATION;  Surgeon: Waynetta Sandy, MD;  Location: [facility];  Service: Vascular;  Laterality: Right;   AORTA - BILATERAL FEMORAL ARTERY BYPASS GRAFT Bilateral 08/07/2020   Procedure: AORTA BIFEMORAL BYPASS GRAFTING;  Surgeon: Waynetta Sandy, MD;  Location: [facility];  Service: Vascular;  Laterality: Bilateral;   BIOPSY N/A 05/15/2014   Procedure: GASTIC,  ASCENDING, AND DESCEDNING/SIGMOID  COLON BIOPSY;  Surgeon: Daneil Dolin, MD;  Location: [facility];  Service: Endoscopy;  Laterality: N/A;   CHOLECYSTECTOMY     COLONOSCOPY WITH PROPOFOL N/A 05/15/2014   Dr. Gala Romney (primary GI is Dr. Oneida Alar). Hemorrhoids, negative random colon biopsies. GI pathogen panel negative. Diverticulosis.   complete hysterectomy     ESOPHAGOGASTRODUODENOSCOPY (EGD) WITH PROPOFOL N/A 05/15/2014   Dr. Gala Romney (primary GI Dr. 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   KNEE SURGERY Right    X 6-5 arthroscopies and open procedure 1 time- tighten patellar tendon and loose IT band   PANCREATECTOMY  11/2013   with splenectomy/hepaticojejunostomy and gastrojejunostomy.   PERIPHERAL VASCULAR INTERVENTION Right 06/04/2019   Procedure: PERIPHERAL VASCULAR INTERVENTION;  Surgeon: Waynetta Sandy, MD;  Location: [facility];  Service: Cardiovascular;  Laterality: Right;  common iliac   PERIPHERAL VASCULAR INTERVENTION Right 11/26/2019   Procedure: PERIPHERAL VASCULAR INTERVENTION;  Surgeon: Waynetta Sandy, MD;  Location: [facility];  Service: Cardiovascular;  Laterality: Right;  Iliac     Allergies  Allergen Reactions   Erythromycin Anaphylaxis, Hives and Nausea Only   Levofloxacin Other (See Comments)    Burning during administration IV prior to surgery.  Pt was told not to take it again.   Niaspan [Niacin] Anaphylaxis   Other Anaphylaxis and Other (See Comments)    Derma Bond severe rash   Penicillins Anaphylaxis    Has patient had a PCN reaction causing immediate rash, facial/tongue/throat swelling, SOB or lightheadedness with hypotension: Yes Has patient had a PCN reaction causing severe rash involving mucus membranes or skin necrosis: No Has patient had a PCN reaction that required hospitalization: No Has patient had a PCN reaction occurring within the last 10 years: No If all of the above answers are "NO", then may proceed with Cephalosporin use.    Shellfish Allergy Anaphylaxis   Sulfa Antibiotics Anaphylaxis    "Blisters in my throat"   Wellbutrin [Bupropion] Other (See Comments)    Suicidal thoughts while on Lexapro   Morphine Other (See Comments)    ineffective   Strawberry Extract Hives   Sulfamethoxazole Other (See Comments)    Blister in throat, sores in mouth   Tape Other (See Comments)    Nicoderm Adhesive Patch cause blisters    Cefdinir     Unknown reaction    Clindamycin/Lincomycin Other (See Comments)    Caused C-Diff   Doxycycline Hives and Nausea Only    A 176m tablet formulation caused hives, nausea, vomiting. 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     escitalopram (LEXAPRO) 20 MG tablet Take 20 mg by mouth daily.     Evolocumab 140 MG/ML SOSY Inject 140 mg into the skin every 14 (fourteen) days. Repatha     glucose blood (PRECISION QID TEST) test strip 1 strip by external route three times daily One touch verio     insulin lispro (HUMALOG) 100 UNIT/ML injection continuous. INSULIN PUMP     levothyroxine (SYNTHROID) 50 MCG tablet Take 50 mcg by mouth See admin instructions. Take 1 tablet (50 mcg) by mouth on Mondays, Tuesdays, Wednesdays, Thursdays, Fridays & Saturdays     levothyroxine (SYNTHROID) 75 MCG tablet Take 75 mcg by mouth every Sunday.     methylphenidate (RITALIN) 10 MG tablet Take 1 tablet (10 mg total) by mouth 2 (two) times daily. 60 tablet 0  methylphenidate (RITALIN) 10 MG tablet Take 1 tablet (10 mg total) by mouth 2 (two) times daily. 60 tablet 0   methylphenidate (RITALIN) 10 MG tablet Take 1 tablet (10 mg total) by mouth 2 (two) times daily. 60 tablet 0   methylphenidate (RITALIN) 10 MG tablet 10 mg in the morning, 15 mg at lunch time (take along with 5 mg tab) 60 tablet 0   methylphenidate (RITALIN) 5 MG tablet Take 1 tablet (5 mg total) by mouth daily at 12 noon. Total of 15 mg at lunch time. Take along with 10 mg tab 30 tablet 0   methylphenidate (RITALIN) 5 MG tablet Take 1 tablet (5 mg total) by mouth daily at 12 noon. Total of 15 mg at lunch time (take along with 10 mg tab) 30 tablet 0   Multiple Vitamin (MULTIVITAMIN WITH MINERALS) TABS tablet Take 2 tablets by mouth daily. DEKA     NARCAN 4 MG/0.1ML LIQD nasal spray kit Place 1 spray into the nose once as needed for opioid reversal.     Nutritional Supplements (VIVONEX RTF) LIQD Take  1,250-1,750 mLs by mouth at bedtime. Additional at lunch     [facility] VERIO test strip 3 (three) times daily.     oxyCODONE (OXY IR/ROXICODONE) 5 MG immediate release tablet Take 1 tablet (5 mg total) by mouth in the morning, at noon, and at bedtime. (Patient taking differently: Take 10 mg by mouth in the morning, at noon, and at bedtime.) 20 tablet 0   Pancrelipase, Lip-Prot-Amyl, (PERTZYE) 16000-57500 units CPEP Take 1 tablet by mouth 3 (three) times daily as needed (with real meals/snack (nutrition not by feeding tube)). Only when eat or snack     pantoprazole (PROTONIX) 40 MG tablet Take 80 mg by mouth at bedtime.      promethazine (PHENERGAN) 12.5 MG tablet Take 1 tablet (12.5 mg total) by mouth every 6 (six) hours as needed for nausea or vomiting. 30 tablet 0   ramelteon (ROZEREM) 8 MG tablet Take 1 tablet (8 mg total) by mouth at bedtime. 30 tablet 0   ramelteon (ROZEREM) 8 MG tablet Take 1 tablet (8 mg total) by mouth at bedtime. 30 tablet 1   senna (SENOKOT) 8.6 MG TABS tablet Take 1 tablet by mouth daily as needed for mild constipation.     Vitamin D, Ergocalciferol, (DRISDOL) 1.25 MG (50000 UNIT) CAPS capsule Take 50,000 Units by mouth every Monday, Wednesday, and Friday.     No current facility-administered medications for this visit.    Family History  Problem Relation Age of Onset   Pancreatic cancer Paternal Grandfather    Cirrhosis Paternal Grandfather    Heart attack Paternal Grandfather    Pancreatitis Other        cousin   Throat cancer Mother    Peripheral Artery Disease Mother    Depression Father    Pancreatitis Father    Colon cancer Neg Hx     Social History   Socioeconomic History   Marital status: Married    Spouse name: Not on file   Number of children: 0   Years of education: Not on file   Highest education level: Not on file  Occupational History   Occupation: disabled  Tobacco Use   Smoking status: Former    Packs/day: 2.00    Years: 30.00     Total pack years: 60.00    Types: Cigarettes    Quit date: 04/21/2020    Years since quitting: 1.7   Smokeless  tobacco: Never   Tobacco comments:    1 cig daily  Vaping Use   Vaping Use: Some days  Substance and Sexual Activity   Alcohol use: No    Alcohol/week: 0.0 standard drinks of alcohol   Drug use: No   Sexual activity: Yes    Birth control/protection: None    Comment: Hysterectomy  Other Topics Concern   Not on file  Social History Narrative   Not on file   Social Determinants of Health   Financial Resource Strain: Not on file  Food Insecurity: Not on file  Transportation Needs: Not on file  Physical Activity: Not on file  Stress: Not on file  Social Connections: Not on file  Intimate Partner Violence: Not on file     REVIEW OF SYSTEMS:  *** [X]  denotes positive finding, [ ]  denotes negative finding Cardiac  Comments:  Chest pain or chest pressure:    Shortness of breath upon exertion:    Short of breath when lying flat:    Irregular heart rhythm:        Vascular    Pain in calf, thigh, or hip brought on by ambulation:    Pain in feet at night that wakes you up from your sleep:     Blood clot in your veins:    Leg swelling:         Pulmonary    Oxygen at home:    Productive cough:     Wheezing:         Neurologic    Sudden weakness in arms or legs:     Sudden numbness in arms or legs:     Sudden onset of difficulty speaking or slurred speech:    Temporary loss of vision in one eye:     Problems with dizziness:         Gastrointestinal    Blood in stool:     Vomited blood:         Genitourinary    Burning when urinating:     Blood in urine:        Psychiatric    Major depression:         Hematologic    Bleeding problems:    Problems with blood clotting too easily:        Skin    Rashes or ulcers:        Constitutional    Fever or chills:      PHYSICAL EXAMINATION:  ***  General:  WDWN in NAD; vital signs documented  above Gait: Not observed HENT: WNL, normocephalic Pulmonary: normal non-labored breathing , without wheezing Cardiac: {Desc; regular/irreg:14544} HR, {With/Without:20273} carotid bruit*** Abdomen: soft, NT, no masses; aortic pulse is *** palpable Skin: {With/Without:20273} rashes Vascular Exam/Pulses:  Right Left  Radial {Exam; arterial pulse strength 0-4:30167} {Exam; arterial pulse strength 0-4:30167}  Femoral {Exam; arterial pulse strength 0-4:30167} {Exam; arterial pulse strength 0-4:30167}  Popliteal {Exam; arterial pulse strength 0-4:30167} {Exam; arterial pulse strength 0-4:30167}  DP {Exam; arterial pulse strength 0-4:30167} {Exam; arterial pulse strength 0-4:30167}  PT {Exam; arterial pulse strength 0-4:30167} {Exam; arterial pulse strength 0-4:30167}  Peroneal *** ***   Extremities: {With/Without:20273} ischemic changes, {With/Without:20273} Gangrene , {With/Without:20273} cellulitis; {With/Without:20273} open wounds Musculoskeletal: no muscle wasting or atrophy  Neurologic: A&O X 3 Psychiatric:  The pt has {Desc; normal/abnormal:11317::"Normal"} affect.   Non-Invasive Vascular Imaging:   ABI's/TBI's on 01/27/2022: Right:  *** - Great toe pressure: *** Left:  *** - Great toe pressure: ***  Arterial duplex on 01/27/2022: ***  Previous ABI's/TBI's on 04/30/2021: Right:  1.14/1.18 - Great toe pressure: 195 Left:  1.26/1.05 - Great toe pressure:  174  Previous arterial duplex on 04/30/2021: +----------+--------+-----+--------+---------+---------+  LEFT      PSV cm/sRatioStenosisWaveform Comments   +----------+--------+-----+--------+---------+---------+  EIA [id number]                  triphasicAFBG limb  +----------+--------+-----+--------+---------+---------+  CFA Distal100                  triphasic           +----------+--------+-----+--------+---------+---------+  POP Distal79                   biphasic             +----------+--------+-----+--------+---------+---------+   No obvious perigraft fluid.     Left Graft #1: Fem -pop  +--------------------+--------+--------+---------+--------+                      PSV cm/sStenosisWaveform Comments  +--------------------+--------+--------+---------+--------+  Inflow              114             triphasic          +--------------------+--------+--------+---------+--------+  Proximal Anastomosis160             biphasic           +--------------------+--------+--------+---------+--------+  Proximal Graft      97              biphasic           +--------------------+--------+--------+---------+--------+  Mid Graft           80              biphasic           +--------------------+--------+--------+---------+--------+  Distal Graft        87              biphasic           +--------------------+--------+--------+---------+--------+  Distal Anastomosis  87              biphasic           +--------------------+--------+--------+---------+--------+  Outflow             66              biphasic           +--------------------+--------+--------+---------+--------+   Summary:  Right: Patent right limb AFBG.   Left: Patent left fem - pop bypass graft with no evidence of stenosis.  Patent left limb AFBG.    ASSESSMENT/PLAN:: 46 y.o. female here for follow up for PAD with hx of right common iliac artery stenting on 2 occasions by Dr. Donzetta Matters.  She also previously had a left femoral popliteal bypass performed in [place].  She underwent aortobifemoral bypass grafting on 08/07/2020 by Dr. Donzetta Matters for CLI.  Bypass required an intervention in the remote past also by Dr. Donzetta Matters.   -*** -continue *** -pt will f/u in *** with ***.   Leontine Locket, [facility] [phone]  Clinic MD:   Donzetta Matters

## 2022-01-27 ENCOUNTER — Ambulatory Visit (INDEPENDENT_AMBULATORY_CARE_PROVIDER_SITE_OTHER)
Admission: RE | Admit: 2022-01-27 | Discharge: 2022-01-27 | Disposition: A | Discharge status: Home / Self Care | Payer: Medicare (Managed Care) | Source: Ambulatory Visit | Attending: Vascular Surgery | Admitting: Vascular Surgery

## 2022-01-27 ENCOUNTER — Ambulatory Visit (INDEPENDENT_AMBULATORY_CARE_PROVIDER_SITE_OTHER): Payer: Medicaid - Out of State | Admitting: Physician Assistant

## 2022-01-27 ENCOUNTER — Ambulatory Visit (HOSPITAL_COMMUNITY)
Admission: RE | Admit: 2022-01-27 | Discharge: 2022-01-27 | Disposition: A | Discharge status: Home / Self Care | Payer: Medicare (Managed Care) | Source: Ambulatory Visit | Attending: Vascular Surgery | Admitting: Vascular Surgery

## 2022-01-27 VITALS — BP 129/82 | HR 71 | Temp 98.7°F | Resp 20 | Ht 66.0 in | Wt 130.0 lb

## 2022-01-27 DIAGNOSIS — I779 Disorder of arteries and arterioles, unspecified: Secondary | ICD-10-CM

## 2022-01-27 DIAGNOSIS — I739 Peripheral vascular disease, unspecified: Secondary | ICD-10-CM | POA: Diagnosis present

## 2022-01-27 DIAGNOSIS — M7989 Other specified soft tissue disorders: Secondary | ICD-10-CM

## 2022-01-27 NOTE — Progress Notes (Signed)
Established Previous Bypass   History of Present Illness   Jessica [name] is a 46 y.o. (11-20-1975) female who presents with worsening LLE claudication and swelling.  Surgical history includes left femoral-popliteal bypass in 2017 performed in [place], [place].  The patient is status post aortobifemoral bypass performed by Dr. Donzetta Matters on 08/07/2020.  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  Her toe amputation site is well-healed.  She has come back today sooner than her usual yearly surveillance for PAD reporting worsening left lower extremity claudication and swelling.  She states that her claudication started again in the left leg about 6 months ago and has been worsening since then.  She said she can start walking to her mailbox without pain but on the way back she starts experiencing cramping in her left calf and left thigh.  The calf is worse than the left eye.  She has also been experiencing some left lower extremity swelling that is worse at night.  She has been elevating her legs at night with a wedge pillow, but she states that the elevation does not help much.  She is not experiencing any swelling of her right leg.  She states that when her left leg gets very swollen she cannot see her "ankle bones".  She has tried mild compression stockings intermittently but does not like the way that they feel.  She denies any rest pain or new nonhealing wounds of the lower extremities.  Her right leg is asymptomatic.  The patient has started smoking again within the last 6 months.  Current Outpatient Medications  Medication Sig Dispense Refill   EPIPEN 2-PAK 0.3 MG/0.3ML SOAJ injection Inject 0.3 mg into the skin daily as needed for anaphylaxis (allergic reaction).     escitalopram (LEXAPRO) 20 MG tablet Take 20 mg by mouth daily.     Evolocumab 140 MG/ML SOSY Inject 140 mg into the skin every 14 (fourteen) days. Repatha     glucose blood (PRECISION QID  TEST) test strip 1 strip by external route three times daily One touch verio     insulin lispro (HUMALOG) 100 UNIT/ML injection continuous. INSULIN PUMP     levothyroxine (SYNTHROID) 50 MCG tablet Take 50 mcg by mouth See admin instructions. Take 1 tablet (50 mcg) by mouth on Mondays, Tuesdays, Wednesdays, Thursdays, Fridays & Saturdays     levothyroxine (SYNTHROID) 75 MCG tablet Take 75 mcg by mouth every Sunday.     methylphenidate (RITALIN) 10 MG tablet 10 mg in the morning, 15 mg at lunch time (take along with 5 mg tab) 60 tablet 0   Multiple Vitamin (MULTIVITAMIN WITH MINERALS) TABS tablet Take 2 tablets by mouth daily. DEKA     NARCAN 4 MG/0.1ML LIQD nasal spray kit Place 1 spray into the nose once as needed for opioid reversal.     Nutritional Supplements (VIVONEX RTF) LIQD Take 1,250-1,750 mLs by mouth at bedtime. Additional at lunch     [facility] VERIO test strip 3 (three) times daily.     oxyCODONE (OXY IR/ROXICODONE) 5 MG immediate release tablet Take 1 tablet (5 mg total) by mouth in the morning, at noon, and at bedtime. (Patient taking differently: Take 10 mg by mouth in the morning, at noon, and at bedtime.) 20 tablet 0   Pancrelipase, Lip-Prot-Amyl, (PERTZYE) 16000-57500 units CPEP Take 1 tablet by mouth 3 (three) times daily as needed (with real meals/snack (nutrition not by feeding tube)). Only when eat or  snack     pantoprazole (PROTONIX) 40 MG tablet Take 80 mg by mouth at bedtime.      promethazine (PHENERGAN) 12.5 MG tablet Take 1 tablet (12.5 mg total) by mouth every 6 (six) hours as needed for nausea or vomiting. 30 tablet 0   senna (SENOKOT) 8.6 MG TABS tablet Take 1 tablet by mouth daily as needed for mild constipation.     Vitamin D, Ergocalciferol, (DRISDOL) 1.25 MG (50000 UNIT) CAPS capsule Take 50,000 Units by mouth every Monday, Wednesday, and Friday.     methylphenidate (RITALIN) 10 MG tablet Take 1 tablet (10 mg total) by mouth 2 (two) times daily. 60 tablet 0    methylphenidate (RITALIN) 10 MG tablet Take 1 tablet (10 mg total) by mouth 2 (two) times daily. 60 tablet 0   methylphenidate (RITALIN) 10 MG tablet Take 1 tablet (10 mg total) by mouth 2 (two) times daily. 60 tablet 0   methylphenidate (RITALIN) 5 MG tablet Take 1 tablet (5 mg total) by mouth daily at 12 noon. Total of 15 mg at lunch time. Take along with 10 mg tab 30 tablet 0   methylphenidate (RITALIN) 5 MG tablet Take 1 tablet (5 mg total) by mouth daily at 12 noon. Total of 15 mg at lunch time (take along with 10 mg tab) 30 tablet 0   ramelteon (ROZEREM) 8 MG tablet Take 1 tablet (8 mg total) by mouth at bedtime. 30 tablet 0   ramelteon (ROZEREM) 8 MG tablet Take 1 tablet (8 mg total) by mouth at bedtime. 30 tablet 1   No current facility-administered medications for this visit.    REVIEW OF SYSTEMS (negative unless checked):   Cardiac:  []  Chest pain or chest pressure? []  Shortness of breath upon activity? []  Shortness of breath when lying flat? []  Irregular heart rhythm?  Vascular:  [x]  Pain in calf, thigh, or hip brought on by walking? []  Pain in feet at night that wakes you up from your sleep? []  Blood clot in your veins? [x]  Leg swelling?  Pulmonary:  []  Oxygen at home? []  Productive cough? []  Wheezing?  Neurologic:  []  Sudden weakness in arms or legs? []  Sudden numbness in arms or legs? []  Sudden onset of difficult speaking or slurred speech? []  Temporary loss of vision in one eye? []  Problems with dizziness?  Gastrointestinal:  []  Blood in stool? []  Vomited blood?  Genitourinary:  []  Burning when urinating? []  Blood in urine?  Psychiatric:  []  Major depression  Hematologic:  []  Bleeding problems? []  Problems with blood clotting?  Dermatologic:  []  Rashes or ulcers?  Constitutional:  []  Fever or chills?  Ear/Nose/Throat:  []  Change in hearing? []  Nose bleeds? []  Sore throat?  Musculoskeletal:  []  Back pain? []  Joint pain? []  Muscle  pain?   Physical Examination   Vitals:   01/27/22 0832  BP: 129/82  Pulse: 71  Resp: 20  Temp: 98.7 F (37.1 C)  TempSrc: Temporal  SpO2: 98%  Weight: 130 lb (59 kg)  Height: 5' 6"  (1.676 m)   Body mass index is 20.98 kg/m.  General:  WDWN in NAD; vital signs documented above Gait: Not observed HENT: WNL, normocephalic Pulmonary: normal non-labored breathing , without Rales, rhonchi,  wheezing Cardiac: regular HR, without murmurs without carotid bruit Abdomen: soft, NT, no masses Skin: without rashes Vascular Exam/Pulses: Femoral pulses bilaterally 2+. DP pulses bilaterally 2+ Extremities: without ischemic changes, without Gangrene , without cellulitis; without open wounds; healed right 5th toe amputation  Musculoskeletal: no muscle wasting or atrophy  Neurologic: A&O X 3;  No focal weakness or paresthesias are detected Psychiatric:  The pt has Normal affect.  Non-Invasive Vascular Imaging ABI (01/27/2022) +---------+------------------+-----+---------+--------+  Right    Rt Pressure (mmHg)IndexWaveform Comment   +---------+------------------+-----+---------+--------+  Brachial 155                                       +---------+------------------+-----+---------+--------+  PTA      166               1.06 triphasic          +---------+------------------+-----+---------+--------+  DP       178               1.13 triphasic          +---------+------------------+-----+---------+--------+  Great Toe124               0.79                    +---------+------------------+-----+---------+--------+   +---------+------------------+-----+---------+-------+  Left     Lt Pressure (mmHg)IndexWaveform Comment  +---------+------------------+-----+---------+-------+  Brachial 157                                      +---------+------------------+-----+---------+-------+  PTA      166               1.06 triphasic          +---------+------------------+-----+---------+-------+  DP       170               1.08 triphasic         +---------+------------------+-----+---------+-------+  Great Toe111               0.71                   +---------+------------------+-----+---------+-------+   +-------+-----------+-----------+------------+------------+  ABI/TBIToday's ABIToday's TBIPrevious ABIPrevious TBI  +-------+-----------+-----------+------------+------------+  Right  1.13       0.79       1.14        1.18          +-------+-----------+-----------+------------+------------+  Left   1.08       0.71       1.26        1.05          +-------+-----------+-----------+------------+------------+   LLE Bypass Duplex (01/27/2022) Left Graft #1: Fem-pop  +--------------------+--------+--------+---------+--------+                      PSV cm/sStenosisWaveform Comments  +--------------------+--------+--------+---------+--------+  Inflow              117             biphasic           +--------------------+--------+--------+---------+--------+  Proximal Anastomosis117             biphasic           +--------------------+--------+--------+---------+--------+  Proximal Graft      156             biphasic           +--------------------+--------+--------+---------+--------+  Mid Graft           65  biphasic           +--------------------+--------+--------+---------+--------+  Distal Graft        74              biphasic           +--------------------+--------+--------+---------+--------+  Distal Anastomosis  139             triphasic          +--------------------+--------+--------+---------+--------+  Outflow             76              biphasic           +--------------------+--------+--------+---------+--------+     Medical Decision Making   Jessica [name] is a 46 y.o. female who presents with LLE claudication and  swelling.  Based off the patient's ABI and bypass duplex study today, the patient has great and unchanged blood flow to her left lower extremity. Her left fem-pop bypass is patent with biphasic flow. Her ABIs are R 1.13 and L 1.08, essentially unchanged since her last visit in November 2022. The patient also has easily palpable DP and femoral pulses bilaterally. I discussed in depth with the patient the nature of atherosclerosis, and emphasized the importance of maximal medical management including strict control of blood pressure, blood glucose, and lipid levels, obtaining regular exercise, and cessation of smoking.   The patient is currently on Repatha Based on the patient's vascular studies and examination, I have offered the patient: BLE ABI and LLE bypass duplex in 1 year for surveillance. She will come back sooner if she develops worsening symptoms, rest pain, or new non healing wounds of the lower extremities The patient has also been measured for mild knee high compression stockings to wear daily for lower extremity swelling. She is aware of the benefit of leg elevation at night as well. Dr.Cain has seen the patient as well and he is in agreement with this plan.   Vicente Serene PA-C [facility] Office: [facility] MD: Donzetta Matters

## 2022-03-30 ENCOUNTER — Other Ambulatory Visit: Payer: Self-pay

## 2022-03-30 ENCOUNTER — Emergency Department (HOSPITAL_COMMUNITY)
Admission: EM | Admit: 2022-03-30 | Discharge: 2022-03-30 | Disposition: A | Discharge status: Home / Self Care | Payer: Medicaid - Out of State | Attending: Emergency Medicine | Admitting: Emergency Medicine

## 2022-03-30 ENCOUNTER — Ambulatory Visit (INDEPENDENT_AMBULATORY_CARE_PROVIDER_SITE_OTHER)
Admission: RE | Admit: 2022-03-30 | Discharge: 2022-03-30 | Disposition: A | Discharge status: Home / Self Care | Payer: Medicaid - Out of State | Source: Ambulatory Visit | Attending: Vascular Surgery | Admitting: Vascular Surgery

## 2022-03-30 ENCOUNTER — Encounter (HOSPITAL_COMMUNITY): Payer: Self-pay

## 2022-03-30 DIAGNOSIS — I824Y9 Acute embolism and thrombosis of unspecified deep veins of unspecified proximal lower extremity: Secondary | ICD-10-CM | POA: Insufficient documentation

## 2022-03-30 DIAGNOSIS — Z79899 Other long term (current) drug therapy: Secondary | ICD-10-CM | POA: Diagnosis not present

## 2022-03-30 DIAGNOSIS — I824Y2 Acute embolism and thrombosis of unspecified deep veins of left proximal lower extremity: Secondary | ICD-10-CM | POA: Diagnosis not present

## 2022-03-30 DIAGNOSIS — M7989 Other specified soft tissue disorders: Secondary | ICD-10-CM | POA: Diagnosis present

## 2022-03-30 DIAGNOSIS — Z794 Long term (current) use of insulin: Secondary | ICD-10-CM | POA: Insufficient documentation

## 2022-03-30 NOTE — ED Triage Notes (Signed)
Pt presents to ED with complaints of left leg pain and swelling x couple of days. Pt went to [facility] yesterday but didn't wait to see EDP. Pt had elevated D Dimer yesterday and had [place] to r/o DVT this [facility] and was told it was negative.

## 2022-03-30 NOTE — ED Provider Notes (Signed)
[facility] EMERGENCY DEPARTMENT Provider Note   CSN: [id number] Arrival date & time: 03/30/22  1144     History  Chief Complaint  Patient presents with   Leg Swelling    Jessica [name] is a 46 y.o. female.  Patient with history of aortobifemoral bypass graft, femoropopliteal bypass graft -- presents to the emergency department for evaluation of left lower leg swelling.  She has had native vein graft harvested from this leg.  She states that over the past 2 weeks she has noted increasing swelling in the left leg versus the right leg.  Over the past couple of days she has developed pain in the posterior thigh and down into her calf.  She presented to outside facility yesterday and had an elevated D-dimer.  She followed up with her vascular surgeon today and had a lower extremity venous Doppler which did not demonstrate DVT.  She denies pallor or color change in the leg.  No abdominal pain or swelling.  No injuries.  She does work 10 to 12 hours a day on her feet.  She states that initially swelling would be improved after elevating legs during sleep, but more recently has been persistently swollen.  No history of lower back surgeries or disc problems.  She denies chest pain or shortness of breath.       Home Medications Prior to Admission medications   Medication Sig Start Date End Date Taking? Authorizing Provider  EPIPEN 2-PAK 0.3 MG/0.3ML SOAJ injection Inject 0.3 mg into the skin daily as needed for anaphylaxis (allergic reaction). 08/21/13   [provider]  escitalopram (LEXAPRO) 20 MG tablet Take 20 mg by mouth daily.    [provider]  Evolocumab 140 MG/ML SOSY Inject 140 mg into the skin every 14 (fourteen) days. Laurence Aly, MD  glucose blood (PRECISION QID TEST) test strip 1 strip by external route three times daily One touch verio 05/01/20   [provider]  insulin lispro (HUMALOG) 100 UNIT/ML injection continuous. INSULIN PUMP 10/07/19    [provider]  levothyroxine (SYNTHROID) 50 MCG tablet Take 50 mcg by mouth See admin instructions. Take 1 tablet (50 mcg) by mouth on Mondays, Tuesdays, Wednesdays, Thursdays, Fridays & Saturdays    [provider]  levothyroxine (SYNTHROID) 75 MCG tablet Take 75 mcg by mouth every Sunday.    [provider]  methylphenidate (RITALIN) 10 MG tablet Take 1 tablet (10 mg total) by mouth 2 (two) times daily. 08/10/21 09/09/21  Norman Clay, MD  methylphenidate (RITALIN) 10 MG tablet Take 1 tablet (10 mg total) by mouth 2 (two) times daily. 09/10/21 10/10/21  Norman Clay, MD  methylphenidate (RITALIN) 10 MG tablet Take 1 tablet (10 mg total) by mouth 2 (two) times daily. 10/11/21 11/10/21  Norman Clay, MD  methylphenidate (RITALIN) 10 MG tablet 10 mg in the morning, 15 mg at lunch time (take along with 5 mg tab) 12/16/21   Norman Clay, MD  methylphenidate (RITALIN) 5 MG tablet Take 1 tablet (5 mg total) by mouth daily at 12 noon. Total of 15 mg at lunch time. Take along with 10 mg tab 10/19/21 11/18/21  Norman Clay, MD  methylphenidate (RITALIN) 5 MG tablet Take 1 tablet (5 mg total) by mouth daily at 12 noon. Total of 15 mg at lunch time (take along with 10 mg tab) 12/24/21 01/23/22  Norman Clay, MD  Multiple Vitamin (MULTIVITAMIN WITH MINERALS) TABS tablet Take 2 tablets by mouth daily. DEKA  [provider]  NARCAN 4 MG/0.1ML LIQD nasal spray kit Place 1 spray into the nose once as needed for opioid reversal. 07/14/20   [provider]  Nutritional Supplements (VIVONEX RTF) LIQD Take 1,250-1,750 mLs by mouth at bedtime. Additional at lunch    [provider]  [facility] VERIO test strip 3 (three) times daily. 05/01/20   [provider]  oxyCODONE (OXY IR/ROXICODONE) 5 MG immediate release tablet Take 1 tablet (5 mg total) by mouth in the morning, at noon, and at bedtime. Patient taking differently: Take 10 mg by mouth in the morning, at  noon, and at bedtime. 11/05/20   Dagoberto Ligas, PA-C  Pancrelipase, Lip-Prot-Amyl, (PERTZYE) 32122-48250 units CPEP Take 1 tablet by mouth 3 (three) times daily as needed (with real meals/snack (nutrition not by feeding tube)). Only when eat or snack    [provider]  pantoprazole (PROTONIX) 40 MG tablet Take 80 mg by mouth at bedtime.     [provider]  promethazine (PHENERGAN) 12.5 MG tablet Take 1 tablet (12.5 mg total) by mouth every 6 (six) hours as needed for nausea or vomiting. 08/25/20   Waynetta Sandy, MD  ramelteon (ROZEREM) 8 MG tablet Take 1 tablet (8 mg total) by mouth at bedtime. 08/03/21 09/02/21  Norman Clay, MD  ramelteon (ROZEREM) 8 MG tablet Take 1 tablet (8 mg total) by mouth at bedtime. 10/19/21 12/18/21  Norman Clay, MD  senna (SENOKOT) 8.6 MG TABS tablet Take 1 tablet by mouth daily as needed for mild constipation.    [provider]  Vitamin D, Ergocalciferol, (DRISDOL) 1.25 MG (50000 UNIT) CAPS capsule Take 50,000 Units by mouth every Monday, Wednesday, and Friday. 06/01/19   [provider]      Allergies    Erythromycin, Levofloxacin, Niaspan [niacin], Other, Penicillins, Shellfish allergy, Sulfa antibiotics, Wellbutrin [bupropion], Morphine, Strawberry extract, Sulfamethoxazole, Tape, Cefdinir, Clindamycin/lincomycin, Doxycycline, Keflex [cephalexin], Statins, Vancomycin, Zetia [ezetimibe], and Glucagon    Review of Systems   Review of Systems  Physical Exam Updated Vital Signs BP (!) 170/100 (BP Location: Right Arm)   Pulse 93   Temp 98.3 F (36.8 C) (Oral)   Resp 14   Ht 5' 6"  (1.676 m)   Wt 56.7 kg   LMP  (LMP Unknown)   SpO2 98%   BMI 20.18 kg/m   Physical Exam Vitals and nursing note reviewed.  Constitutional:      Appearance: She is well-developed.  HENT:     Head: Normocephalic and atraumatic.  Eyes:     Conjunctiva/sclera: Conjunctivae normal.  Cardiovascular:     Pulses:          Dorsalis  pedis pulses are 2+ on the right side and 2+ on the left side.     Comments: Readily palpable DP pulses in bilateral feet.  Skin is warm, no mottling or pallor/cyanosis. Pulmonary:     Effort: No respiratory distress.  Musculoskeletal:     Cervical back: Normal range of motion and neck supple.     Right lower leg: No edema.     Left lower leg: Edema present.     Comments: Mild left lower extremity edema when compared to the right.  No significant tenderness to palpation at time of exam.  Skin:    General: Skin is warm and dry.  Neurological:     Mental Status: She is alert.     ED Results / Procedures / Treatments   Labs (all labs ordered are listed, but  only abnormal results are displayed) Labs Reviewed - No data to display  EKG None  Radiology VAS [place] LOWER EXTREMITY VENOUS (DVT)  Result Date: 03/30/2022  Lower Venous DVT Study Patient Name:  Jessica [name] Gehrig  Date of Exam:   03/30/2022 Medical Rec #: [id number]       Accession #:    [id number] Date of Birth: 1976-02-26       Patient Gender: F Patient Age:   80 years Exam [facility] Procedure:      VAS [place] LOWER EXTREMITY VENOUS (DVT) Referring Phys: Harrell Gave DICKSON --------------------------------------------------------------------------------  Indications: Left leg pain behind the knee, into the calf and involving the arch of foot and toes. Elevated D-Dimer.  Performing Technologist: Ronal Fear RVS, RCS  Examination Guidelines: A complete evaluation includes B-mode imaging, spectral Doppler, color Doppler, and power Doppler as needed of all accessible portions of each vessel. Bilateral testing is considered an integral part of a complete examination. Limited examinations for reoccurring indications may be performed as noted. The reflux portion of the exam is performed with the patient in reverse Trendelenburg.  +---------+---------------+---------+-----------+----------+--------------+ LEFT      CompressibilityPhasicitySpontaneityPropertiesThrombus Aging +---------+---------------+---------+-----------+----------+--------------+ CFV      Full                                                        +---------+---------------+---------+-----------+----------+--------------+ SFJ      Full                                                        +---------+---------------+---------+-----------+----------+--------------+ FV Prox  Full                                                        +---------+---------------+---------+-----------+----------+--------------+ FV Mid   Full                                                        +---------+---------------+---------+-----------+----------+--------------+ FV DistalFull                                                        +---------+---------------+---------+-----------+----------+--------------+ POP      Full                                                        +---------+---------------+---------+-----------+----------+--------------+ PTV      Full                                                        +---------+---------------+---------+-----------+----------+--------------+  PERO     Full                                                        +---------+---------------+---------+-----------+----------+--------------+ SSV      Full                                                        +---------+---------------+---------+-----------+----------+--------------+     Summary: LEFT: - There is no evidence of deep vein thrombosis in the lower extremity. - There is no evidence of superficial venous thrombosis. - No popliteal cyst. -The femoropopliteal bypass graft is widely patent.  *See table(s) above for measurements and observations. Electronically signed by Monica Martinez MD on 03/30/2022 at 12:01:09 PM.    Final     Procedures Procedures    Medications Ordered in ED Medications - No  data to display  ED Course/ Medical Decision Making/ A&P    Patient seen and examined. History obtained directly from patient.  Reviewed DVT study from today and most recent arterial duplex/ABI performed in August.  Labs/EKG: None ordered  Imaging: None ordered  Medications/Fluids: None ordered  Most recent vital signs reviewed and are as follows: BP (!) 170/100 (BP Location: Right Arm)   Pulse 93   Temp 98.3 F (36.8 C) (Oral)   Resp 14   Ht 5' 6"  (1.676 m)   Wt 56.7 kg   LMP  (LMP Unknown)   SpO2 98%   BMI 20.18 kg/m   Initial impression: Left lower extremity edema  Home treatment plan: Rest, elevation, compression when able.  Return instructions discussed with patient: Worsening pain, redness, swelling, chest pain or shortness of breath  Follow-up instructions discussed with patient: Follow-up with vascular surgeon in 1 week.                          Medical Decision Making  Patient with left lower extremity swelling when compared to right.  She had Doppler study already performed today which was negative.  She has good pulses and no signs of arterial insufficiency despite complicated history of peripheral arterial disease.  No clinical signs and symptoms suggestive of PE.  No signs of cellulitis or abscess.  No emergent etiology suspected.  Patient encouraged to perform basic care and follow-up when able with vascular surgeon.  The patient's vital signs, pertinent lab work and imaging were reviewed and interpreted as discussed in the ED course. Hospitalization was considered for further testing, treatments, or serial exams/observation. However as patient is well-appearing, has a stable exam, and reassuring studies today, I do not feel that they warrant admission at this time. This plan was discussed with the patient who verbalizes agreement and comfort with this plan and seems reliable and able to return to the Emergency Department with worsening or changing symptoms.          Final Clinical Impression(s) / ED Diagnoses Final diagnoses:  Swelling of left lower extremity    Rx / DC Orders ED Discharge Orders     None         Carlisle Cater, PA-C 03/30/22 1350  Audley Hose, MD 03/30/22 [phone]

## 2022-03-30 NOTE — ED Notes (Signed)
ED Provider at bedside. 

## 2022-03-30 NOTE — Discharge Instructions (Signed)
Please read and follow all provided instructions.  Your diagnoses today include:  1. Swelling of left lower extremity    Tests performed today include: Vital signs. See below for your results today.   Medications prescribed:  None  Take any prescribed medications only as directed.  Home care instructions:  Follow any educational materials contained in this packet.  Wear compression socks and elevate leg is much as possible.  Follow-up instructions: Please follow-up with your vascular surgeon in the next 1 week for reevaluation and further recommendations.  Return instructions:  Please return to the Emergency Department if you experience worsening symptoms.  Return if you develop chest pain, shortness of breath, difficulty breathing, or redness or warmth in the leg. Please return if you have any other emergent concerns.  Additional Information:  Your vital signs today were: BP (!) 170/100 (BP Location: Right Arm)   Pulse 93   Temp 98.3 F (36.8 C) (Oral)   Resp 14   Ht 5\' 6"  (1.676 m)   Wt 56.7 kg   LMP  (LMP Unknown)   SpO2 98%   BMI 20.18 kg/m  If your blood pressure (BP) was elevated above 135/85 this visit, please have this repeated by your doctor within one month. --------------

## 2022-04-19 ENCOUNTER — Telehealth: Payer: Self-pay

## 2022-04-19 DIAGNOSIS — I824Y2 Acute embolism and thrombosis of unspecified deep veins of left proximal lower extremity: Secondary | ICD-10-CM

## 2022-04-19 NOTE — Telephone Encounter (Addendum)
Pt called stating that her L leg swelling has worsened, her D dimer was elevated when she went to the ED, and her PCP had just prescribed Lasix to treat.  Reviewed pt's chart, returned call for clarification, two identifiers used. Inquired if Lasix improved leg swelling and pt stated that it has not. The LLE is so swollen that it is to the point of weeping. The leg is not red or hot, so she feels that it is not cellulitis. She has an appt with her PCP this afternoon. Pt instructed to call this office if they felt that she needed to be seen again. Confirmed understanding.  Pt called stating that her PCP was referring her back to [facility] because they didn't feel that it was cellulitis.   Called pt to see if she could make a 9 AM appt for tomorrow. Appts scheduled for [place] at [facility] and [place] after.   Called pt to inform her of appt times, no answer, lf vm.

## 2022-04-19 NOTE — Addendum Note (Signed)
Addended by: Dorita Sciara, Juvon Teater A on: 04/19/2022 03:18 PM   Modules accepted: Orders

## 2022-04-20 ENCOUNTER — Ambulatory Visit (HOSPITAL_COMMUNITY)
Admission: RE | Admit: 2022-04-20 | Discharge: 2022-04-20 | Disposition: A | Discharge status: Home / Self Care | Payer: Medicare (Managed Care) | Source: Ambulatory Visit | Attending: Vascular Surgery | Admitting: Vascular Surgery

## 2022-04-20 ENCOUNTER — Ambulatory Visit (INDEPENDENT_AMBULATORY_CARE_PROVIDER_SITE_OTHER): Payer: Medicare (Managed Care) | Admitting: Physician Assistant

## 2022-04-20 VITALS — BP 135/83 | HR 80 | Temp 98.0°F | Resp 20 | Ht 66.0 in | Wt 128.7 lb

## 2022-04-20 DIAGNOSIS — I739 Peripheral vascular disease, unspecified: Secondary | ICD-10-CM | POA: Diagnosis not present

## 2022-04-20 DIAGNOSIS — I824Y2 Acute embolism and thrombosis of unspecified deep veins of left proximal lower extremity: Secondary | ICD-10-CM | POA: Insufficient documentation

## 2022-04-20 DIAGNOSIS — M7989 Other specified soft tissue disorders: Secondary | ICD-10-CM

## 2022-04-20 DIAGNOSIS — R2242 Localized swelling, mass and lump, left lower limb: Secondary | ICD-10-CM

## 2022-04-20 NOTE — Progress Notes (Signed)
Lower extremity venous duplex has been completed.   Preliminary results in [facility] Proc.   Ogle Hoeffner Lacy Sofia 04/20/2022 9:01 AM

## 2022-04-20 NOTE — Progress Notes (Signed)
Established Previous Bypass   History of Present Illness   Jessica [name] is a 46 y.o. (Mar 28, 1976) female who presents with worsening LLE swelling.  Surgical history includes left femoral-popliteal bypass in 2017 performed in [place], [place]. She underwent aortobifemoral bypass performed by Dr. Donzetta Matters on 08/07/2020.  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  Her toe amputation site has been healed for a while now.  At her last visit with [place] on 01/27/2022, she was experiencing worsening left lower extremity claudication and swelling.  Her ABIs and bypass duplex at that visit demonstrated patent bypass grafts and unchanged blood flow.  It was recommended to her to wear mild knee-high compression stockings daily and elevate her legs as needed for swelling.  She was to follow-up in 1 year with bilateral ABIs and left lower extremity bypass graft duplex.  She is here today for another visit reporting worsening left lower extremity swelling.  She states that the swelling has gotten worse since our last visit.  Her swelling is fine in the morning, and then after being at work for 10 hours, her left leg is significantly swollen.  After about the 8-hour mark at work, she states that she has to take her compression stockings off because her left leg is so swollen.  The stocking will feel extremely tight and painful.  She is also elevating her legs multiple times throughout the day to try to help with her symptoms.  This has proven slightly helpful.  She denies any weeping or ulcerations of the left lower extremity.  Current Outpatient Medications  Medication Sig Dispense Refill   EPIPEN 2-PAK 0.3 MG/0.3ML SOAJ injection Inject 0.3 mg into the skin daily as needed for anaphylaxis (allergic reaction).     escitalopram (LEXAPRO) 20 MG tablet Take 20 mg by mouth daily.     Evolocumab 140 MG/ML SOSY Inject 140 mg into the skin every 14 (fourteen) days. Repatha     glucose  blood (PRECISION QID TEST) test strip 1 strip by external route three times daily One touch verio     insulin lispro (HUMALOG) 100 UNIT/ML injection continuous. INSULIN PUMP     levothyroxine (SYNTHROID) 50 MCG tablet Take 50 mcg by mouth See admin instructions. Take 1 tablet (50 mcg) by mouth on Mondays, Tuesdays, Wednesdays, Thursdays, Fridays & Saturdays     levothyroxine (SYNTHROID) 75 MCG tablet Take 75 mcg by mouth every Sunday.     methylphenidate (RITALIN) 10 MG tablet 10 mg in the morning, 15 mg at lunch time (take along with 5 mg tab) 60 tablet 0   Multiple Vitamin (MULTIVITAMIN WITH MINERALS) TABS tablet Take 2 tablets by mouth daily. DEKA     NARCAN 4 MG/0.1ML LIQD nasal spray kit Place 1 spray into the nose once as needed for opioid reversal.     Nutritional Supplements (VIVONEX RTF) LIQD Take 1,250-1,750 mLs by mouth at bedtime. Additional at lunch     [facility] VERIO test strip 3 (three) times daily.     oxyCODONE (OXY IR/ROXICODONE) 5 MG immediate release tablet Take 1 tablet (5 mg total) by mouth in the morning, at noon, and at bedtime. (Patient taking differently: Take 10 mg by mouth in the morning, at noon, and at bedtime.) 20 tablet 0   Pancrelipase, Lip-Prot-Amyl, (PERTZYE) 16000-57500 units CPEP Take 1 tablet by mouth 3 (three) times daily as needed (with real meals/snack (nutrition not by feeding tube)). Only when eat or snack  pantoprazole (PROTONIX) 40 MG tablet Take 80 mg by mouth at bedtime.      promethazine (PHENERGAN) 12.5 MG tablet Take 1 tablet (12.5 mg total) by mouth every 6 (six) hours as needed for nausea or vomiting. 30 tablet 0   senna (SENOKOT) 8.6 MG TABS tablet Take 1 tablet by mouth daily as needed for mild constipation.     Vitamin D, Ergocalciferol, (DRISDOL) 1.25 MG (50000 UNIT) CAPS capsule Take 50,000 Units by mouth every Monday, Wednesday, and Friday.     methylphenidate (RITALIN) 10 MG tablet Take 1 tablet (10 mg total) by mouth 2 (two) times daily.  60 tablet 0   methylphenidate (RITALIN) 10 MG tablet Take 1 tablet (10 mg total) by mouth 2 (two) times daily. 60 tablet 0   methylphenidate (RITALIN) 10 MG tablet Take 1 tablet (10 mg total) by mouth 2 (two) times daily. 60 tablet 0   methylphenidate (RITALIN) 5 MG tablet Take 1 tablet (5 mg total) by mouth daily at 12 noon. Total of 15 mg at lunch time. Take along with 10 mg tab 30 tablet 0   methylphenidate (RITALIN) 5 MG tablet Take 1 tablet (5 mg total) by mouth daily at 12 noon. Total of 15 mg at lunch time (take along with 10 mg tab) 30 tablet 0   ramelteon (ROZEREM) 8 MG tablet Take 1 tablet (8 mg total) by mouth at bedtime. 30 tablet 0   ramelteon (ROZEREM) 8 MG tablet Take 1 tablet (8 mg total) by mouth at bedtime. 30 tablet 1   No current facility-administered medications for this visit.    REVIEW OF SYSTEMS (negative unless checked):   Cardiac:  _0  Chest pain or chest pressure? _1  Shortness of breath upon activity? _2  Shortness of breath when lying flat? _3  Irregular heart rhythm?  Vascular:  _4  Pain in calf, thigh, or hip brought on by walking? _5  Pain in feet at night that wakes you up from your sleep? _6  Blood clot in your veins? _7  Leg swelling?  Pulmonary:  _8  Oxygen at home? _9  Productive cough? _10  Wheezing?  Neurologic:  _11  Sudden weakness in arms or legs? _12  Sudden numbness in arms or legs? _13  Sudden onset of difficult speaking or slurred speech? _14  Temporary loss of vision in one eye? _15  Problems with dizziness?  Gastrointestinal:  _16  Blood in stool? _17  Vomited blood?  Genitourinary:  _18  Burning when urinating? _19  Blood in urine?  Psychiatric:  _20  Major depression  Hematologic:  _21  Bleeding problems? _22  Problems with blood clotting?  Dermatologic:  _23  Rashes or ulcers?  Constitutional:  _24  Fever or chills?  Ear/Nose/Throat:  _25  Change in hearing? _26  Nose bleeds? _27  Sore throat?  Musculoskeletal:  _28  Back pain? _29  Joint pain? _30   Muscle pain?   Physical Examination   Vitals:   04/20/22 0936  BP: 135/83  Pulse: 80  Resp: 20  Temp: 98 F (36.7 C)  TempSrc: Temporal  SpO2: 98%  Weight: 128 lb 11.2 oz (58.4 kg)  Height: _31  (1.676 m)   Body mass index is 20.77 kg/m.  General:  WDWN in NAD; vital signs documented above Gait: Not observed HENT: WNL, normocephalic Pulmonary: normal non-labored breathing , without Rales, rhonchi,  wheezing Cardiac: regular HR Abdomen: soft, NT, no masses Skin: without rashes Vascular Exam/Pulses: Palpable DP pulses 2+ Extremities: Left lower extremity 2+ pitting edema extending to the dorsum of the foot.  No ulcerations or wounds Musculoskeletal: no muscle wasting or atrophy  Neurologic: A&O X  3;  No focal weakness or paresthesias are detected Psychiatric:  The pt has Normal affect.  Non-Invasive Vascular Imaging DVT study (04/20/2022)  RIGHT:  - No evidence of common femoral vein obstruction.     LEFT:  - There is no evidence of deep vein thrombosis in the lower extremity.     - No cystic structure found in the popliteal fossa.    Medical Decision Making   KENASIA SCHELLER is a 46 y.o. female who presents with worsening left lower extremity swelling  Based on the patient's vascular study, there is no evidence of DVT in the left lower extremity or cystic structure in the popliteal fossa I have explained to the patient that she may have a component of lymphedema, considering that her swelling now extends to the dorsum of her left foot.  I believe she may benefit from the use of thigh-high compression stockings, since her knee-high compression stockings get too tight halfway throughout her workday.  I have also written her a work note suggesting that she would benefit from shorter work shifts lasting between 6 to 8 hours, rather than her current 10 hours.  Getting off of her feet may help improve her symptoms She can still follow-up with [place] in 1 year with repeat ABIs  and left lower extremity bypass graft duplex study  Jessica Serene PA-C [facility] Office: [facility] MD: Carlis Abbott

## 2022-12-27 IMAGING — DX DG ABDOMEN 2V
2 series · 2 of 2 positions shown · non-contrast
Comparison: 08/08/2020, 08/07/2020

CLINICAL DATA: Abdominal pain chronic GJ feeding tube aorto bi
femoral bypass 08/07/2020

EXAM:
ABDOMEN - 2 VIEW

[abdomen erect]
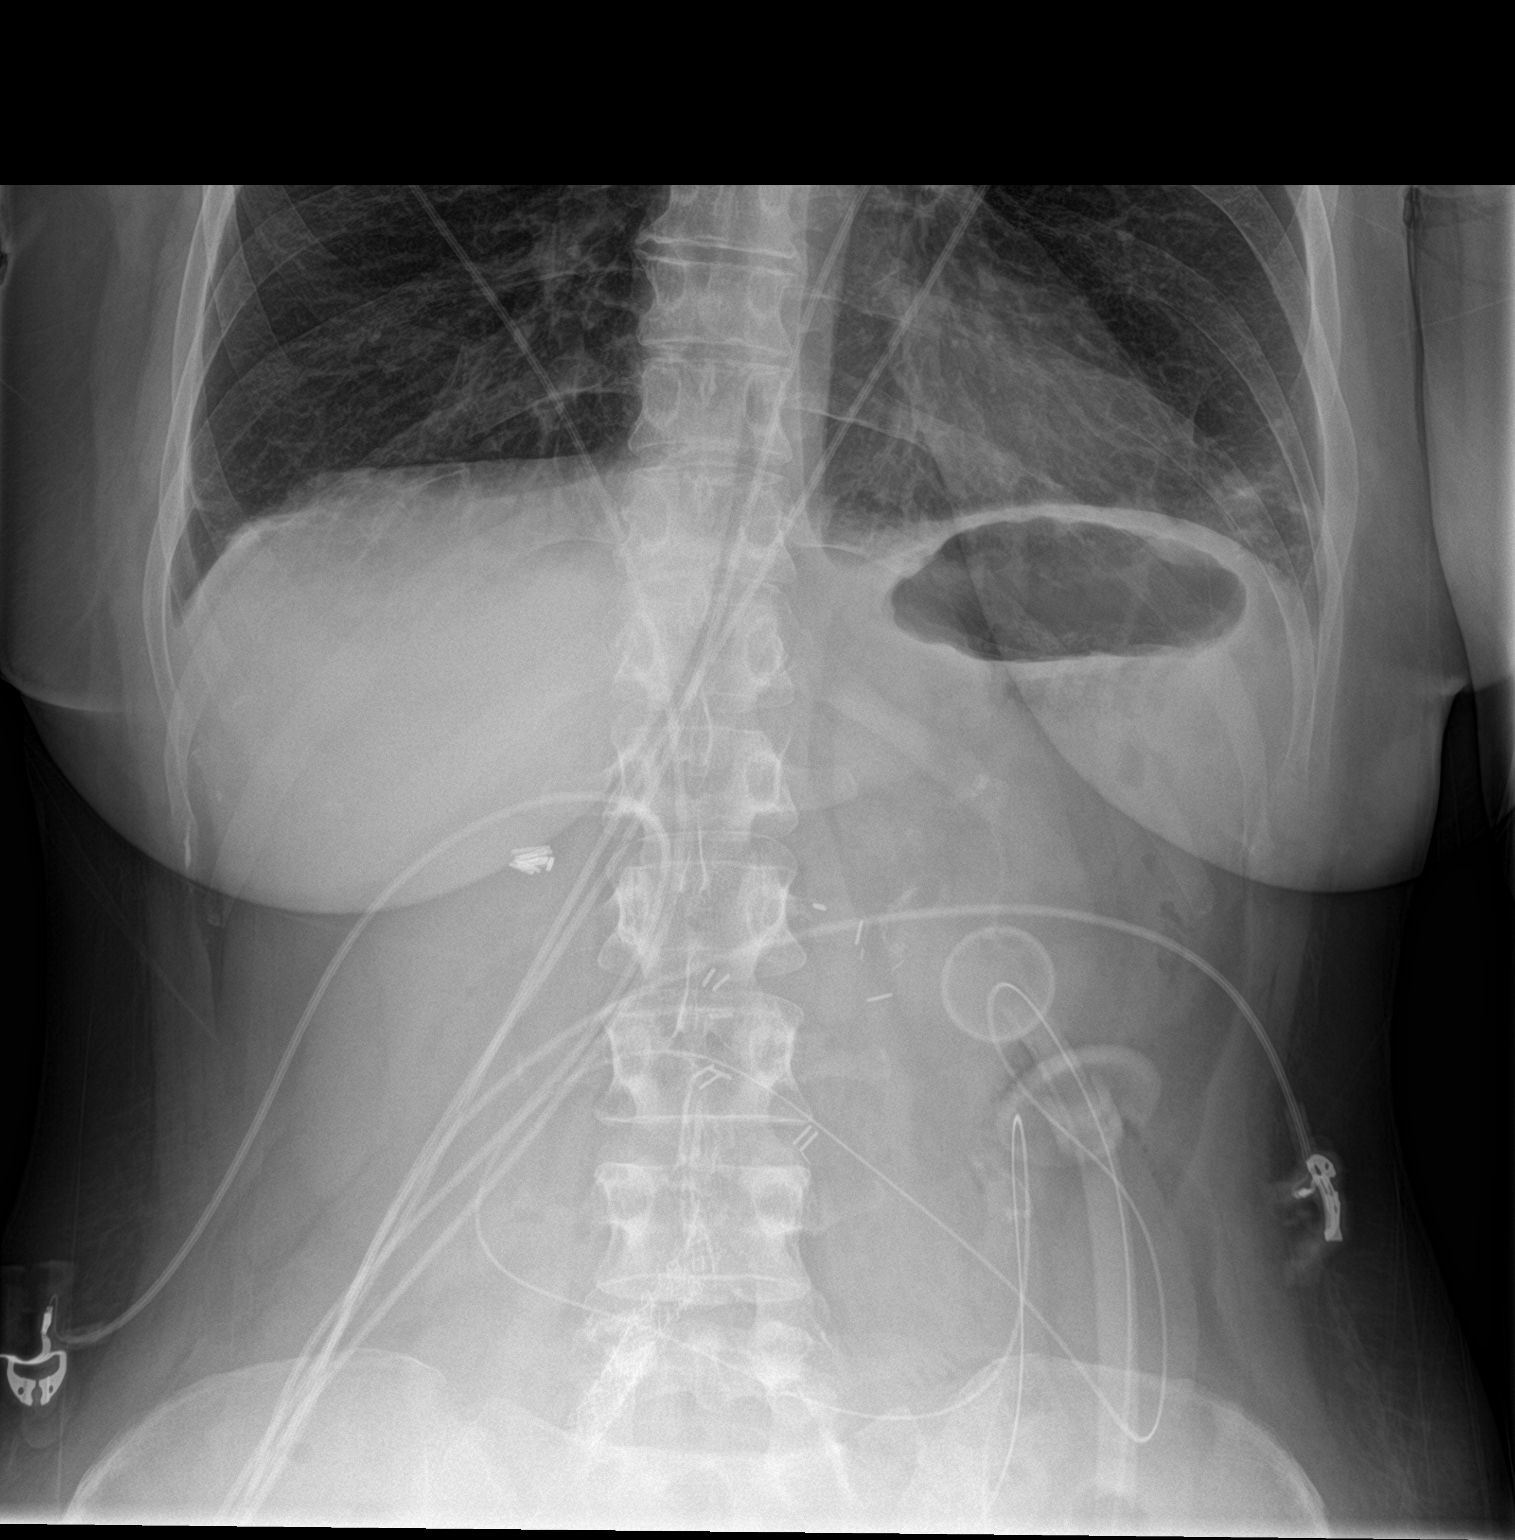

[abdomen supine]
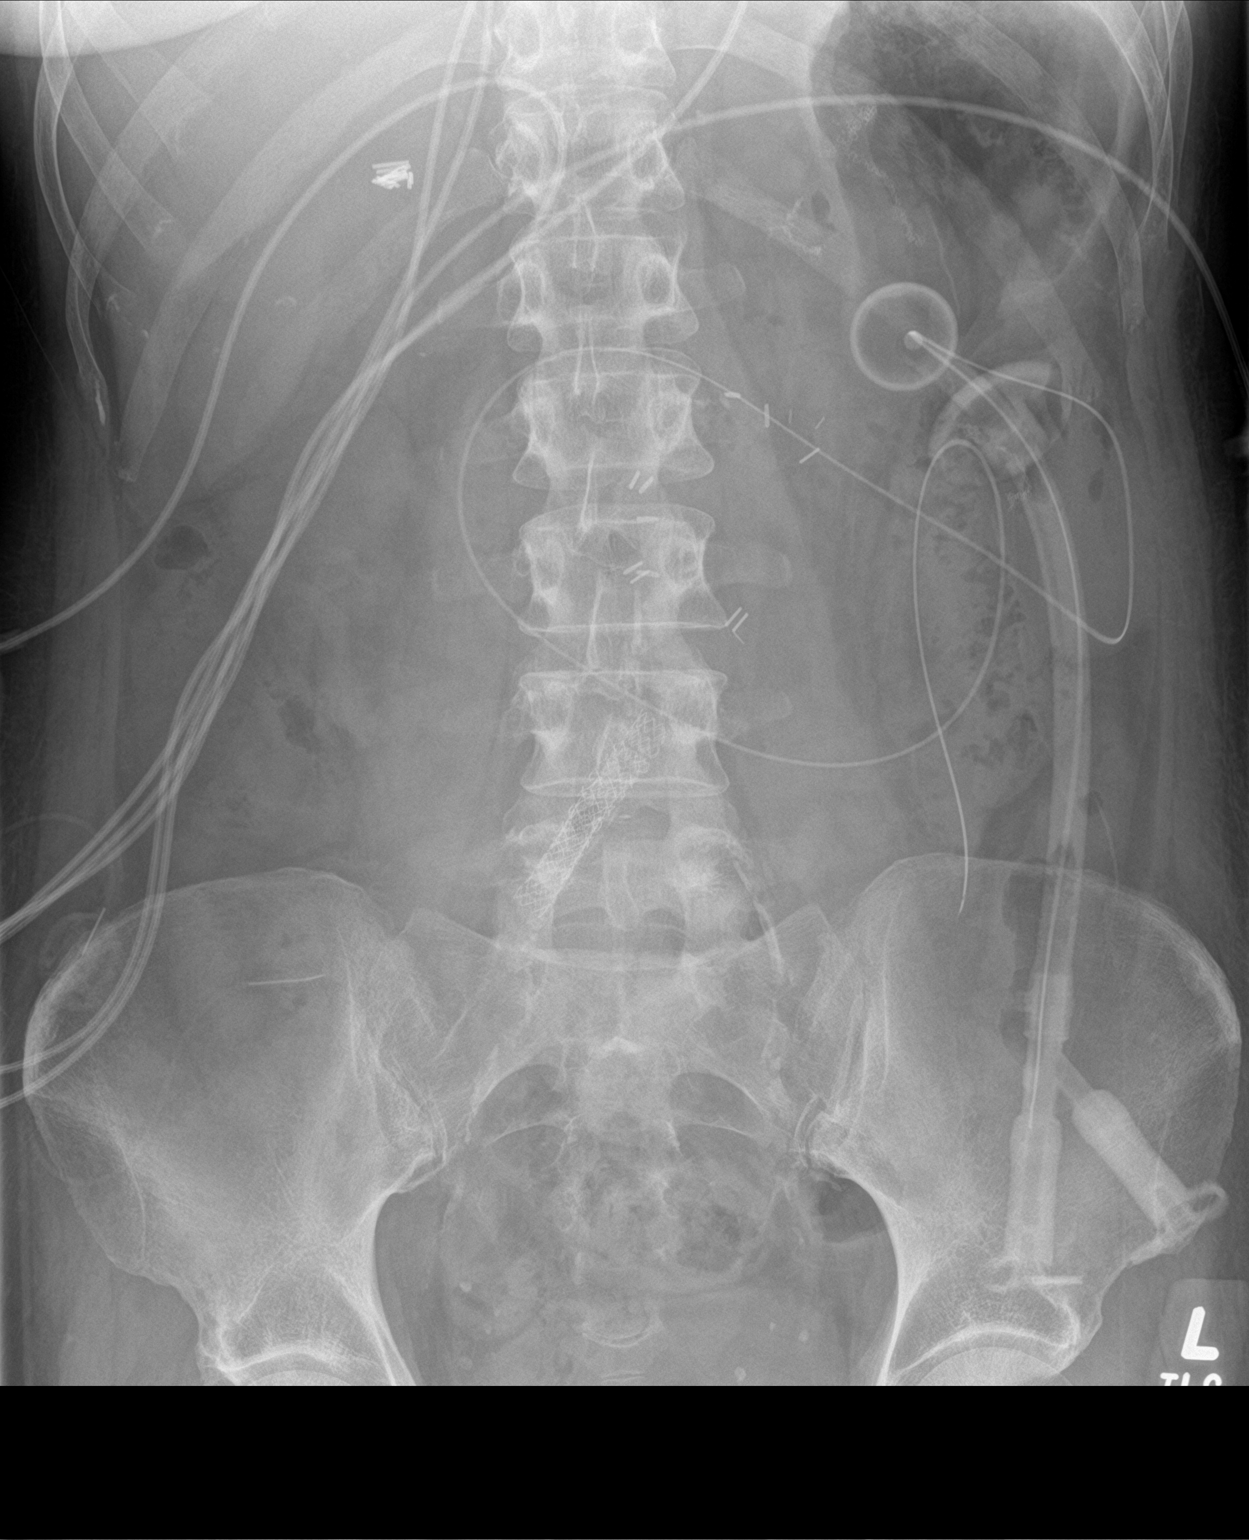

[2 of 2 positions shown; findings below may reference images not displayed]

FINDINGS: Minor bandlike basilar atelectasis or scarring.  No free air.

Stable position of the GJ feeding tube. Postop changes throughout
the abdomen as before. Negative for obstruction or ileus. No
significant bowel dilatation. Aortoiliac atherosclerosis noted.
Right common iliac vascular stents present. No acute osseous
finding.
IMPRESSION: Negative for obstruction or ileus. No free air. Stable GJ feeding
tube position.

## 2023-03-04 ENCOUNTER — Ambulatory Visit: Payer: Medicare Other | Attending: Surgical Critical Care

## 2023-03-04 DIAGNOSIS — Z931 Gastrostomy status: Secondary | ICD-10-CM

## 2023-03-04 DIAGNOSIS — Z9884 Bariatric surgery status: Principal | ICD-10-CM

## 2023-03-04 DIAGNOSIS — R112 Nausea with vomiting, unspecified: Secondary | ICD-10-CM

## 2023-03-04 DIAGNOSIS — R1084 Generalized abdominal pain: Secondary | ICD-10-CM

## 2023-03-04 DIAGNOSIS — I739 Peripheral vascular disease, unspecified: Secondary | ICD-10-CM

## 2023-03-04 DIAGNOSIS — Z90410 Acquired total absence of pancreas: Secondary | ICD-10-CM

## 2023-03-04 DIAGNOSIS — E7800 Pure hypercholesterolemia, unspecified: Secondary | ICD-10-CM

## 2023-03-04 DIAGNOSIS — K9420 Gastrostomy complication, unspecified: Secondary | ICD-10-CM

## 2023-03-04 DIAGNOSIS — E119 Type 2 diabetes mellitus without complications: Secondary | ICD-10-CM

## 2023-03-04 DIAGNOSIS — E063 Autoimmune thyroiditis: Secondary | ICD-10-CM

## 2023-03-04 MED ORDER — OXYCODONE HCL 20 MG PO TABS
20 mg | ORAL | PRN
Start: 2023-03-04 — End: ?

## 2023-03-04 MED ORDER — PROMETHAZINE HCL 6.25 MG/5ML PO SOLN
12.5 mg | ENTERAL
Start: 2023-03-04 — End: ?

## 2023-03-07 DIAGNOSIS — Z931 Gastrostomy status: Secondary | ICD-10-CM

## 2023-03-07 DIAGNOSIS — R112 Nausea with vomiting, unspecified: Secondary | ICD-10-CM

## 2023-03-07 DIAGNOSIS — R1084 Generalized abdominal pain: Secondary | ICD-10-CM

## 2023-03-07 DIAGNOSIS — K9420 Gastrostomy complication, unspecified: Secondary | ICD-10-CM

## 2023-03-07 DIAGNOSIS — Z9884 Bariatric surgery status: Principal | ICD-10-CM

## 2023-03-07 DIAGNOSIS — Z90410 Acquired total absence of pancreas: Secondary | ICD-10-CM

## 2023-03-10 DIAGNOSIS — R112 Nausea with vomiting, unspecified: Secondary | ICD-10-CM

## 2023-03-10 DIAGNOSIS — Z9884 Bariatric surgery status: Principal | ICD-10-CM

## 2023-03-15 ENCOUNTER — Inpatient Hospital Stay: Admit: 2023-03-15 | Discharge: 2023-03-16 | Payer: Medicare Other

## 2023-03-15 ENCOUNTER — Encounter

## 2023-03-15 DIAGNOSIS — Z90410 Acquired total absence of pancreas: Secondary | ICD-10-CM

## 2023-03-15 DIAGNOSIS — Z931 Gastrostomy status: Secondary | ICD-10-CM

## 2023-03-15 DIAGNOSIS — E119 Type 2 diabetes mellitus without complications: Principal | ICD-10-CM

## 2023-03-15 DIAGNOSIS — R112 Nausea with vomiting, unspecified: Secondary | ICD-10-CM

## 2023-03-15 DIAGNOSIS — Z9884 Bariatric surgery status: Principal | ICD-10-CM

## 2023-03-15 DIAGNOSIS — R1084 Generalized abdominal pain: Secondary | ICD-10-CM

## 2023-03-15 DIAGNOSIS — K9420 Gastrostomy complication, unspecified: Secondary | ICD-10-CM

## 2023-03-15 DIAGNOSIS — E7800 Pure hypercholesterolemia, unspecified: Secondary | ICD-10-CM

## 2023-03-15 DIAGNOSIS — I739 Peripheral vascular disease, unspecified: Secondary | ICD-10-CM

## 2023-03-15 DIAGNOSIS — E063 Autoimmune thyroiditis: Secondary | ICD-10-CM

## 2023-03-15 MED ORDER — FENTANYL CITRATE INJ 50 MCG/ML CUSTOM AMP/VIAL
25-50 ug | INTRAVENOUS | Status: DC | PRN
Start: 2023-03-15 — End: 2023-03-15

## 2023-03-15 MED ORDER — LIDOCAINE HCL (PF) 2 % IJ SOLN SH
Freq: Once | SUBCUTANEOUS | Status: DC
Start: 2023-03-15 — End: 2023-03-15

## 2023-03-21 ENCOUNTER — Encounter: Payer: Medicare Other | Attending: Family

## 2023-03-31 ENCOUNTER — Encounter

## 2023-04-04 ENCOUNTER — Ambulatory Visit: Payer: Medicare Other | Attending: Student in an Organized Health Care Education/Training Program

## 2023-04-04 DIAGNOSIS — Z9884 Bariatric surgery status: Principal | ICD-10-CM

## 2023-04-04 DIAGNOSIS — Z934 Other artificial openings of gastrointestinal tract status: Secondary | ICD-10-CM

## 2023-04-04 MED ORDER — PANTOPRAZOLE SODIUM 20 MG PO TBEC
20 mg | Freq: Every evening | ORAL
Start: 2023-04-04 — End: ?

## 2023-04-07 ENCOUNTER — Inpatient Hospital Stay: Admit: 2023-04-07 | Discharge: 2023-04-08 | Payer: Medicare Other

## 2023-04-07 DIAGNOSIS — Z9884 Bariatric surgery status: Principal | ICD-10-CM

## 2023-04-07 DIAGNOSIS — R112 Nausea with vomiting, unspecified: Secondary | ICD-10-CM

## 2023-04-07 MED ORDER — SOD BICARB-CITRIC AC-SIMETH 2.21-1.53-0.04 G PO PACK
1 | PACK | Freq: Once | ORAL | Status: CP
Start: 2023-04-07 — End: ?

## 2023-04-07 MED ORDER — BARIUM SULFATE 60 % PO SUSP
100 mL | Freq: Once | ORAL | Status: CP
Start: 2023-04-07 — End: ?

## 2023-04-07 MED ORDER — DIATRIZOATE MEGLUMINE & SODIUM 66-10 % PO SOLN
90 mL | Freq: Once | ORAL | Status: CP
Start: 2023-04-07 — End: ?

## 2023-04-07 MED ORDER — BARIUM SULFATE 98 % PO SUSR
135 mL | Freq: Once | ORAL | Status: CP
Start: 2023-04-07 — End: ?

## 2023-04-11 ENCOUNTER — Ambulatory Visit: Payer: Medicare Other | Attending: Surgical Critical Care

## 2023-04-11 ENCOUNTER — Inpatient Hospital Stay: Admit: 2023-04-11 | Discharge: 2023-04-12 | Payer: Medicare Other

## 2023-04-11 DIAGNOSIS — E786 Lipoprotein deficiency: Secondary | ICD-10-CM

## 2023-04-11 DIAGNOSIS — K219 Gastro-esophageal reflux disease without esophagitis: Secondary | ICD-10-CM

## 2023-04-11 DIAGNOSIS — E7800 Pure hypercholesterolemia, unspecified: Secondary | ICD-10-CM

## 2023-04-11 DIAGNOSIS — E559 Vitamin D deficiency, unspecified: Secondary | ICD-10-CM

## 2023-04-11 DIAGNOSIS — I739 Peripheral vascular disease, unspecified: Secondary | ICD-10-CM

## 2023-04-11 DIAGNOSIS — E063 Autoimmune thyroiditis: Secondary | ICD-10-CM

## 2023-04-11 DIAGNOSIS — E119 Type 2 diabetes mellitus without complications: Principal | ICD-10-CM

## 2023-04-11 DIAGNOSIS — Z90410 Acquired total absence of pancreas: Principal | ICD-10-CM

## 2023-04-11 DIAGNOSIS — R112 Nausea with vomiting, unspecified: Secondary | ICD-10-CM

## 2023-04-11 DIAGNOSIS — K222 Esophageal obstruction: Secondary | ICD-10-CM

## 2023-04-11 DIAGNOSIS — K859 Acute pancreatitis without necrosis or infection, unspecified: Secondary | ICD-10-CM

## 2023-04-11 DIAGNOSIS — K8689 Other specified diseases of pancreas: Secondary | ICD-10-CM

## 2023-04-11 DIAGNOSIS — M549 Dorsalgia, unspecified: Secondary | ICD-10-CM

## 2023-04-11 DIAGNOSIS — Z9884 Bariatric surgery status: Secondary | ICD-10-CM

## 2023-04-11 MED ORDER — VITAMIN D3 1.25 MG (50000 UT) PO CAPS
ORAL_CAPSULE | ORAL
Start: 2023-04-11 — End: ?

## 2023-04-11 MED ORDER — PROMETHAZINE HCL 6.25 MG/5ML PO SOLN
12.5 mg | JEJUNOSTOMY | 3 refills | 15.00000 days | Status: CP | PRN
Start: 2023-04-11 — End: ?

## 2023-04-12 DIAGNOSIS — Z789 Other specified health status: Principal | ICD-10-CM

## 2023-04-19 ENCOUNTER — Ambulatory Visit: Payer: Medicare Other

## 2023-04-19 DIAGNOSIS — K219 Gastro-esophageal reflux disease without esophagitis: Secondary | ICD-10-CM

## 2023-04-19 DIAGNOSIS — K222 Esophageal obstruction: Secondary | ICD-10-CM

## 2023-04-19 DIAGNOSIS — E063 Autoimmune thyroiditis: Secondary | ICD-10-CM

## 2023-04-19 DIAGNOSIS — I739 Peripheral vascular disease, unspecified: Secondary | ICD-10-CM

## 2023-04-19 DIAGNOSIS — R112 Nausea with vomiting, unspecified: Secondary | ICD-10-CM

## 2023-04-19 DIAGNOSIS — K859 Acute pancreatitis without necrosis or infection, unspecified: Secondary | ICD-10-CM

## 2023-04-19 DIAGNOSIS — E786 Lipoprotein deficiency: Secondary | ICD-10-CM

## 2023-04-19 DIAGNOSIS — K8689 Other specified diseases of pancreas: Secondary | ICD-10-CM

## 2023-04-19 DIAGNOSIS — E119 Type 2 diabetes mellitus without complications: Principal | ICD-10-CM

## 2023-04-19 DIAGNOSIS — E559 Vitamin D deficiency, unspecified: Secondary | ICD-10-CM

## 2023-04-19 DIAGNOSIS — M549 Dorsalgia, unspecified: Secondary | ICD-10-CM

## 2023-04-19 MED ORDER — CLINDAMYCIN PHOSPHATE IN D5W 900 MG/50ML IV SOLN
900 mg | Freq: Once | INTRAVENOUS | Status: DC
Start: 2023-04-19 — End: 2023-04-19

## 2023-04-19 MED ORDER — HYDROMORPHONE HCL 2 MG/ML IJ SOLN JX
0.5 mg | INTRAVENOUS | Status: CP | PRN
Start: 2023-04-19 — End: ?

## 2023-04-19 MED ORDER — PROPOFOL 10 MG/ML IV EMUL CUSTOM SH
INTRAVENOUS | Status: DC | PRN
Start: 2023-04-19 — End: 2023-04-19

## 2023-04-19 MED ORDER — PROPOFOL INFUSION JX
INTRAVENOUS | Status: DC | PRN
Start: 2023-04-19 — End: 2023-04-19

## 2023-04-19 MED ORDER — LIDOCAINE HCL (PF) 1 % IJ SOLN SH
INTRAVENOUS | Status: DC | PRN
Start: 2023-04-19 — End: 2023-04-19

## 2023-04-19 MED ORDER — HYDROMORPHONE HCL 2 MG/ML IJ SOLN JX
0.5 mg | INTRAVENOUS | Status: CN | PRN
Start: 2023-04-19 — End: ?

## 2023-04-19 MED ORDER — GLYCOPYRROLATE 0.4 MG/2ML IJ SOLN
INTRAVENOUS | Status: DC | PRN
Start: 2023-04-19 — End: 2023-04-19

## 2023-04-19 MED ORDER — ESMOLOL HCL 10 MG/ML IV SOLN SH
INTRAVENOUS | Status: DC | PRN
Start: 2023-04-19 — End: 2023-04-19

## 2023-04-19 MED ORDER — OXYCODONE HCL 20 MG/20ML PO SOLN UD JX
20 mg | Freq: Once | JEJUNOSTOMY | Status: CP
Start: 2023-04-19 — End: ?

## 2023-04-20 DIAGNOSIS — E559 Vitamin D deficiency, unspecified: Secondary | ICD-10-CM

## 2023-04-20 DIAGNOSIS — I739 Peripheral vascular disease, unspecified: Secondary | ICD-10-CM

## 2023-04-20 DIAGNOSIS — M549 Dorsalgia, unspecified: Secondary | ICD-10-CM

## 2023-04-20 DIAGNOSIS — E786 Lipoprotein deficiency: Secondary | ICD-10-CM

## 2023-04-20 DIAGNOSIS — K859 Acute pancreatitis without necrosis or infection, unspecified: Secondary | ICD-10-CM

## 2023-04-20 DIAGNOSIS — E119 Type 2 diabetes mellitus without complications: Principal | ICD-10-CM

## 2023-04-20 DIAGNOSIS — K222 Esophageal obstruction: Secondary | ICD-10-CM

## 2023-04-20 DIAGNOSIS — K219 Gastro-esophageal reflux disease without esophagitis: Secondary | ICD-10-CM

## 2023-04-20 DIAGNOSIS — E063 Autoimmune thyroiditis: Secondary | ICD-10-CM

## 2023-04-20 DIAGNOSIS — K8689 Other specified diseases of pancreas: Secondary | ICD-10-CM

## 2023-04-20 DIAGNOSIS — R112 Nausea with vomiting, unspecified: Secondary | ICD-10-CM

## 2023-04-25 ENCOUNTER — Encounter

## 2023-04-26 ENCOUNTER — Encounter

## 2023-04-29 ENCOUNTER — Ambulatory Visit: Payer: Medicare Other | Attending: Surgical Critical Care

## 2023-04-29 DIAGNOSIS — M549 Dorsalgia, unspecified: Secondary | ICD-10-CM

## 2023-04-29 DIAGNOSIS — K859 Acute pancreatitis without necrosis or infection, unspecified: Secondary | ICD-10-CM

## 2023-04-29 DIAGNOSIS — I739 Peripheral vascular disease, unspecified: Secondary | ICD-10-CM

## 2023-04-29 DIAGNOSIS — E559 Vitamin D deficiency, unspecified: Secondary | ICD-10-CM

## 2023-04-29 DIAGNOSIS — K8689 Other specified diseases of pancreas: Secondary | ICD-10-CM

## 2023-04-29 DIAGNOSIS — E119 Type 2 diabetes mellitus without complications: Principal | ICD-10-CM

## 2023-04-29 DIAGNOSIS — R112 Nausea with vomiting, unspecified: Secondary | ICD-10-CM

## 2023-04-29 DIAGNOSIS — E063 Autoimmune thyroiditis: Secondary | ICD-10-CM

## 2023-04-29 DIAGNOSIS — K222 Esophageal obstruction: Secondary | ICD-10-CM

## 2023-04-29 DIAGNOSIS — K219 Gastro-esophageal reflux disease without esophagitis: Secondary | ICD-10-CM

## 2023-04-29 DIAGNOSIS — E786 Lipoprotein deficiency: Secondary | ICD-10-CM

## 2023-05-11 ENCOUNTER — Encounter: Payer: Medicare Other | Attending: Family

## 2023-05-17 ENCOUNTER — Inpatient Hospital Stay: Admit: 2023-05-17 | Discharge: 2023-05-18 | Payer: Medicare Other

## 2023-05-17 DIAGNOSIS — Z9884 Bariatric surgery status: Principal | ICD-10-CM

## 2023-05-17 MED ORDER — TECHNETIUM TC 99M SULFUR COLLOID INJECTION
350 | Freq: Once | ORAL | Status: CP
Start: 2023-05-17 — End: ?

## 2023-06-03 ENCOUNTER — Encounter: Payer: Medicare Other | Attending: Surgical Critical Care

## 2023-06-10 DIAGNOSIS — R112 Nausea with vomiting, unspecified: Principal | ICD-10-CM

## 2023-06-10 MED ORDER — PROMETHAZINE HCL 6.25 MG/5ML PO SOLN
12.5 mg | JEJUNOSTOMY | 3 refills | 15.00000 days | Status: CP | PRN
Start: 2023-06-10 — End: ?

## 2023-06-13 DIAGNOSIS — R112 Nausea with vomiting, unspecified: Principal | ICD-10-CM

## 2023-06-13 MED ORDER — PROMETHAZINE HCL 6.25 MG/5ML PO SOLN
12.5 mg | JEJUNOSTOMY | 3 refills | 15.00000 days | Status: CP | PRN
Start: 2023-06-13 — End: ?

## 2023-07-12 ENCOUNTER — Inpatient Hospital Stay: Payer: Medicare Other

## 2023-07-12 ENCOUNTER — Observation Stay: Payer: Medicare Other

## 2023-07-12 DIAGNOSIS — K219 Gastro-esophageal reflux disease without esophagitis: Secondary | ICD-10-CM

## 2023-07-12 DIAGNOSIS — E119 Type 2 diabetes mellitus without complications: Principal | ICD-10-CM

## 2023-07-12 DIAGNOSIS — E786 Lipoprotein deficiency: Secondary | ICD-10-CM

## 2023-07-12 DIAGNOSIS — Z789 Other specified health status: Principal | ICD-10-CM

## 2023-07-12 DIAGNOSIS — I739 Peripheral vascular disease, unspecified: Secondary | ICD-10-CM

## 2023-07-12 DIAGNOSIS — E559 Vitamin D deficiency, unspecified: Secondary | ICD-10-CM

## 2023-07-12 DIAGNOSIS — K222 Esophageal obstruction: Secondary | ICD-10-CM

## 2023-07-12 DIAGNOSIS — K8689 Other specified diseases of pancreas: Secondary | ICD-10-CM

## 2023-07-12 DIAGNOSIS — K859 Acute pancreatitis without necrosis or infection, unspecified: Secondary | ICD-10-CM

## 2023-07-12 DIAGNOSIS — E063 Autoimmune thyroiditis: Secondary | ICD-10-CM

## 2023-07-12 DIAGNOSIS — M549 Dorsalgia, unspecified: Secondary | ICD-10-CM

## 2023-07-12 DIAGNOSIS — R112 Nausea with vomiting, unspecified: Secondary | ICD-10-CM

## 2023-07-12 MED ORDER — GLUCOSE 4 G PO CHEW JX
16 g | ORAL | Status: DC | PRN
Start: 2023-07-12 — End: 2023-07-12

## 2023-07-12 MED ORDER — LEVOTHYROXINE SODIUM 50 MCG PO TABS
50 ug | Freq: Every morning | GASTROSTOMY | Status: SS
Start: 2023-07-12 — End: ?

## 2023-07-12 MED ORDER — PREGABALIN 150 MG PO CAPS
150 mg | Freq: Three times a day (TID) | Status: SS
Start: 2023-07-12 — End: ?

## 2023-07-12 MED ORDER — OXYCODONE-ACETAMINOPHEN 5-325 MG PO TABS
5 mg | Freq: Once | ORAL | Status: CP
Start: 2023-07-12 — End: ?

## 2023-07-12 MED ORDER — LEVOTHYROXINE SODIUM 50 MCG PO TABS
50 ug | Freq: Every morning | GASTROSTOMY | Status: CP
Start: 2023-07-12 — End: ?

## 2023-07-12 MED ORDER — LEVOTHYROXINE SODIUM 75 MCG PO TABS
75 ug | Freq: Every day | ORAL | Status: DC
Start: 2023-07-12 — End: 2023-07-12

## 2023-07-12 MED ORDER — ORAL CARBOHYDRATE FOR HYPOGLYCEMIA JX
4-8 [oz_av] | ORAL | Status: DC | PRN
Start: 2023-07-12 — End: 2023-07-12

## 2023-07-12 MED ORDER — ORAL CARBOHYDRATE FOR HYPOGLYCEMIA JX
4-8 [oz_av] | ORAL | Status: CP | PRN
Start: 2023-07-12 — End: ?

## 2023-07-12 MED ORDER — ASPIRIN 81 MG PO CHEW
81 mg | Freq: Every day | GASTROSTOMY | Status: SS
Start: 2023-07-12 — End: ?

## 2023-07-12 MED ORDER — APIXABAN 5 MG PO TABS
5 mg | Freq: Two times a day (BID) | GASTROSTOMY | Status: SS
Start: 2023-07-12 — End: ?

## 2023-07-12 MED ORDER — DEXTROSE 50 % IV SOLN
50 mL | INTRAVENOUS | Status: DC | PRN
Start: 2023-07-12 — End: 2023-07-12

## 2023-07-12 MED ORDER — DEXTROSE 50 % IV SOLN
50 mL | INTRAVENOUS | Status: CP | PRN
Start: 2023-07-12 — End: ?

## 2023-07-12 MED ORDER — TRAZODONE HCL 50 MG PO TABS
50 mg | Freq: Every evening | ORAL | Status: CP | PRN
Start: 2023-07-12 — End: ?

## 2023-07-12 MED ORDER — DOCUSATE SODIUM 100 MG PO CAPS
100 mg | Freq: Two times a day (BID) | ORAL | Status: CP | PRN
Start: 2023-07-12 — End: ?

## 2023-07-12 MED ORDER — OXYCODONE HCL 10 MG PO TABS
20 mg | GASTROSTOMY | Status: CP | PRN
Start: 2023-07-12 — End: ?

## 2023-07-12 MED ORDER — KETOROLAC TROMETHAMINE 15 MG/ML IJ SOLN
15 mg | Freq: Once | INTRAVENOUS | Status: CP
Start: 2023-07-12 — End: ?

## 2023-07-12 MED ORDER — PREGABALIN 75 MG PO CAPS
150 mg | Freq: Three times a day (TID) | GASTROSTOMY | Status: CP
Start: 2023-07-12 — End: ?

## 2023-07-12 MED ORDER — LEVOTHYROXINE SODIUM 50 MCG PO TABS
50 ug | Freq: Every morning | ORAL | Status: DC
Start: 2023-07-12 — End: 2023-07-12

## 2023-07-12 MED ORDER — PANTOPRAZOLE SODIUM 40 MG PO TBEC
40 mg | Freq: Every evening | ORAL | Status: CP
Start: 2023-07-12 — End: ?

## 2023-07-12 MED ORDER — POLYETHYLENE GLYCOL 3350 17 G PO PACK
17 g | Freq: Every day | ORAL | Status: CP | PRN
Start: 2023-07-12 — End: ?

## 2023-07-12 MED ORDER — DIPHENHYDRAMINE HCL 25 MG PO CAPS
25 mg | Freq: Four times a day (QID) | ORAL | Status: CP | PRN
Start: 2023-07-12 — End: ?

## 2023-07-12 MED ORDER — ASPIRIN 81 MG PO CHEW
81 mg | Freq: Every day | GASTROSTOMY | Status: DC
Start: 2023-07-12 — End: 2023-07-12

## 2023-07-12 MED ORDER — HYDROMORPHONE HCL 1 MG/ML IJ SOLN
1 mg | Freq: Four times a day (QID) | INTRAVENOUS | Status: CP | PRN
Start: 2023-07-12 — End: ?

## 2023-07-12 MED ORDER — GLUCOSE 4 G PO CHEW JX
16 g | ORAL | Status: CP | PRN
Start: 2023-07-12 — End: ?

## 2023-07-12 MED ORDER — HYDRALAZINE HCL 20 MG/ML IJ SOLN
10 mg | Freq: Four times a day (QID) | INTRAVENOUS | Status: CP | PRN
Start: 2023-07-12 — End: ?

## 2023-07-12 MED ORDER — ONDANSETRON HCL 4 MG/2 ML IJ SOLN SH
4 mg | Freq: Four times a day (QID) | INTRAVENOUS | Status: CP | PRN
Start: 2023-07-12 — End: ?

## 2023-07-12 MED ORDER — BOLUS IV FLUID JX
Freq: Once | INTRAVENOUS | Status: CP
Start: 2023-07-12 — End: ?

## 2023-07-12 MED ORDER — ONDANSETRON 4 MG PO TBDP
4 mg | Freq: Four times a day (QID) | ORAL | Status: CP | PRN
Start: 2023-07-12 — End: ?

## 2023-07-13 MED ORDER — PROMETHAZINE HCL 12.5 MG PO TABS
25 mg | Freq: Once | ORAL | Status: CP
Start: 2023-07-13 — End: ?

## 2023-07-13 MED ORDER — PREGABALIN 75 MG PO CAPS
150 mg | Freq: Three times a day (TID) | ORAL | Status: CP
Start: 2023-07-13 — End: ?

## 2023-07-13 MED ORDER — LEVOTHYROXINE SODIUM 50 MCG PO TABS
50 ug | Freq: Every morning | ORAL | Status: CP
Start: 2023-07-13 — End: ?

## 2023-07-13 MED ORDER — PATIENT MANAGED INSULIN PUMP 100 UNITS/ML JX
.3 [IU]/h | SUBCUTANEOUS | Status: CP
Start: 2023-07-13 — End: ?

## 2023-07-13 MED ORDER — HYDROMORPHONE HCL 1 MG/ML IJ SOLN
.5 mg | INTRAVENOUS | Status: CP | PRN
Start: 2023-07-13 — End: ?

## 2023-07-13 MED ORDER — OXYCODONE HCL 10 MG PO TABS
20 mg | ORAL | 0 refills | Status: CP | PRN
Start: 2023-07-13 — End: ?

## 2023-07-14 DIAGNOSIS — E063 Autoimmune thyroiditis: Secondary | ICD-10-CM

## 2023-07-14 DIAGNOSIS — K222 Esophageal obstruction: Secondary | ICD-10-CM

## 2023-07-14 DIAGNOSIS — E559 Vitamin D deficiency, unspecified: Secondary | ICD-10-CM

## 2023-07-14 DIAGNOSIS — M549 Dorsalgia, unspecified: Secondary | ICD-10-CM

## 2023-07-14 DIAGNOSIS — K219 Gastro-esophageal reflux disease without esophagitis: Secondary | ICD-10-CM

## 2023-07-14 DIAGNOSIS — I739 Peripheral vascular disease, unspecified: Secondary | ICD-10-CM

## 2023-07-14 DIAGNOSIS — E786 Lipoprotein deficiency: Secondary | ICD-10-CM

## 2023-07-14 DIAGNOSIS — K859 Acute pancreatitis without necrosis or infection, unspecified: Secondary | ICD-10-CM

## 2023-07-14 DIAGNOSIS — E119 Type 2 diabetes mellitus without complications: Principal | ICD-10-CM

## 2023-07-14 DIAGNOSIS — R112 Nausea with vomiting, unspecified: Secondary | ICD-10-CM

## 2023-07-14 DIAGNOSIS — K8689 Other specified diseases of pancreas: Secondary | ICD-10-CM

## 2023-07-14 MED ORDER — EPHEDRINE SULFATE-NACL (PF) 5-0.9 MG/ML-% IV SOSY SH
INTRAVENOUS | Status: DC | PRN
Start: 2023-07-14 — End: 2023-07-14

## 2023-07-14 MED ORDER — PROPOFOL 10 MG/ML IV EMUL CUSTOM SH
INTRAVENOUS | Status: DC | PRN
Start: 2023-07-14 — End: 2023-07-14

## 2023-07-14 MED ORDER — PHENYLEPHRINE HCL-NACL 0.1-0.9 MG/ML-% IV SOLN SH
INTRAVENOUS | Status: DC | PRN
Start: 2023-07-14 — End: 2023-07-14

## 2023-07-15 ENCOUNTER — Encounter: Payer: Medicare Other | Attending: Surgical Critical Care | Primary: Geriatric Medicine

## 2023-07-15 DIAGNOSIS — K219 Gastro-esophageal reflux disease without esophagitis: Secondary | ICD-10-CM

## 2023-07-15 DIAGNOSIS — I739 Peripheral vascular disease, unspecified: Secondary | ICD-10-CM

## 2023-07-15 DIAGNOSIS — E786 Lipoprotein deficiency: Secondary | ICD-10-CM

## 2023-07-15 DIAGNOSIS — R112 Nausea with vomiting, unspecified: Secondary | ICD-10-CM

## 2023-07-15 DIAGNOSIS — E063 Autoimmune thyroiditis: Secondary | ICD-10-CM

## 2023-07-15 DIAGNOSIS — E119 Type 2 diabetes mellitus without complications: Principal | ICD-10-CM

## 2023-07-15 DIAGNOSIS — M549 Dorsalgia, unspecified: Secondary | ICD-10-CM

## 2023-07-15 DIAGNOSIS — K8689 Other specified diseases of pancreas: Secondary | ICD-10-CM

## 2023-07-15 DIAGNOSIS — K222 Esophageal obstruction: Secondary | ICD-10-CM

## 2023-07-15 DIAGNOSIS — E559 Vitamin D deficiency, unspecified: Secondary | ICD-10-CM

## 2023-07-15 DIAGNOSIS — K859 Acute pancreatitis without necrosis or infection, unspecified: Secondary | ICD-10-CM

## 2023-08-01 DIAGNOSIS — K219 Gastro-esophageal reflux disease without esophagitis: Secondary | ICD-10-CM

## 2023-08-01 DIAGNOSIS — K222 Esophageal obstruction: Secondary | ICD-10-CM

## 2023-08-01 DIAGNOSIS — I739 Peripheral vascular disease, unspecified: Secondary | ICD-10-CM

## 2023-08-01 DIAGNOSIS — E063 Autoimmune thyroiditis: Secondary | ICD-10-CM

## 2023-08-01 DIAGNOSIS — R112 Nausea with vomiting, unspecified: Secondary | ICD-10-CM

## 2023-08-01 DIAGNOSIS — E559 Vitamin D deficiency, unspecified: Secondary | ICD-10-CM

## 2023-08-01 DIAGNOSIS — K859 Acute pancreatitis without necrosis or infection, unspecified: Secondary | ICD-10-CM

## 2023-08-01 DIAGNOSIS — K8689 Other specified diseases of pancreas: Secondary | ICD-10-CM

## 2023-08-01 DIAGNOSIS — M549 Dorsalgia, unspecified: Secondary | ICD-10-CM

## 2023-08-01 DIAGNOSIS — E786 Lipoprotein deficiency: Secondary | ICD-10-CM

## 2023-08-01 DIAGNOSIS — E119 Type 2 diabetes mellitus without complications: Principal | ICD-10-CM

## 2023-08-15 ENCOUNTER — Inpatient Hospital Stay: Admit: 2023-08-15 | Payer: Medicare Other

## 2023-08-15 ENCOUNTER — Emergency Department: Admit: 2023-08-15 | Payer: Medicare Other

## 2023-08-15 DIAGNOSIS — M5410 Radiculopathy, site unspecified: Secondary | ICD-10-CM

## 2023-08-15 DIAGNOSIS — J189 Pneumonia, unspecified organism: Secondary | ICD-10-CM

## 2023-08-15 DIAGNOSIS — D72829 Elevated white blood cell count, unspecified: Secondary | ICD-10-CM

## 2023-08-15 DIAGNOSIS — R29898 Other symptoms and signs involving the musculoskeletal system: Principal | ICD-10-CM

## 2023-08-15 DIAGNOSIS — R202 Paresthesia of skin: Secondary | ICD-10-CM

## 2023-08-15 MED ORDER — ZZ IMS TEMPLATE
75 ug | ORAL | Status: CP
Start: 2023-08-15 — End: ?

## 2023-08-15 MED ORDER — PREGABALIN 150 MG PO CAPS
150 mg | Freq: Three times a day (TID) | ORAL | Status: CP
Start: 2023-08-15 — End: ?

## 2023-08-15 MED ORDER — ACETAMINOPHEN 325 MG PO TABS
650 mg | ORAL | Status: DC | PRN
Start: 2023-08-15 — End: 2023-08-15

## 2023-08-15 MED ORDER — ATORVASTATIN CALCIUM 80 MG PO TABS
80 mg | Freq: Every evening | GASTROSTOMY | Status: DC
Start: 2023-08-15 — End: 2023-08-16

## 2023-08-15 MED ORDER — ENOXAPARIN SODIUM 40 MG/0.4ML IJ SOSY
40 mg | Freq: Every day | SUBCUTANEOUS | Status: DC
Start: 2023-08-15 — End: 2023-08-15

## 2023-08-15 MED ORDER — OXYCODONE HCL 10 MG PO TABS
10 mg | ORAL | Status: DC | PRN
Start: 2023-08-15 — End: 2023-08-15

## 2023-08-15 MED ORDER — LEVOTHYROXINE SODIUM 50 MCG PO TABS
50 ug | ORAL | Status: CP
Start: 2023-08-15 — End: ?

## 2023-08-15 MED ORDER — ASPIRIN 81 MG PO CHEW
81 mg | Freq: Every day | GASTROSTOMY | Status: DC
Start: 2023-08-15 — End: 2023-08-16

## 2023-08-15 MED ORDER — LORAZEPAM 2 MG/ML IJ SOLN
2 mg | INTRAVENOUS | Status: CP | PRN
Start: 2023-08-15 — End: ?

## 2023-08-15 MED ORDER — IOHEXOL 350 MG/ML IV SOLN SH
80 mL | Freq: Once | INTRAVENOUS | Status: CP
Start: 2023-08-15 — End: ?

## 2023-08-15 MED ORDER — ATORVASTATIN CALCIUM 80 MG PO TABS
80 mg | Freq: Every evening | ORAL | Status: CP
Start: 2023-08-15 — End: ?

## 2023-08-15 MED ORDER — DEXTROSE 50 % IV SOLN
50 mL | INTRAVENOUS | Status: CP | PRN
Start: 2023-08-15 — End: ?

## 2023-08-15 MED ORDER — FUROSEMIDE 40 MG PO TABS
40 mg | Freq: Every day | ORAL | Status: SS
Start: 2023-08-15 — End: ?

## 2023-08-15 MED ORDER — FUROSEMIDE 40 MG PO TABS
40 mg | Freq: Every day | ORAL | Status: CP
Start: 2023-08-15 — End: ?

## 2023-08-15 MED ORDER — OXYCODONE ER 9 MG PO C12A
9 mg | Freq: Two times a day (BID) | GASTROSTOMY | 0 refills | Status: DC
Start: 2023-08-15 — End: 2023-08-16

## 2023-08-15 MED ORDER — BOLUS IV FLUID JX
Freq: Once | INTRAVENOUS | Status: CP
Start: 2023-08-15 — End: ?

## 2023-08-15 MED ORDER — OXYCODONE HCL 10 MG PO TABS
20 mg | ORAL | 0 refills | Status: CP | PRN
Start: 2023-08-15 — End: ?

## 2023-08-15 MED ORDER — GLUCOSE 4 G PO CHEW JX
16 g | ORAL | Status: CP | PRN
Start: 2023-08-15 — End: ?

## 2023-08-15 MED ORDER — DEXTROSE 50 % IV SOLN
50 mL | INTRAVENOUS | Status: DC | PRN
Start: 2023-08-15 — End: 2023-08-15

## 2023-08-15 MED ORDER — PANTOPRAZOLE SODIUM 40 MG PO TBEC
40 mg | Freq: Every day | ORAL | Status: CP
Start: 2023-08-15 — End: ?

## 2023-08-15 MED ORDER — ASPIRIN 81 MG PO CHEW
81 mg | Freq: Every day | ORAL | Status: CP
Start: 2023-08-15 — End: ?

## 2023-08-15 MED ORDER — CYCLOBENZAPRINE HCL 5 MG PO TABS
5 mg | Freq: Three times a day (TID) | ORAL | Status: CP
Start: 2023-08-15 — End: ?

## 2023-08-15 MED ORDER — ONDANSETRON HCL 4 MG/2 ML IJ SOLN SH
4 mg | Freq: Four times a day (QID) | INTRAVENOUS | Status: CP | PRN
Start: 2023-08-15 — End: ?

## 2023-08-15 MED ORDER — KETOROLAC TROMETHAMINE 15 MG/ML IJ SOLN
15 mg | Freq: Once | INTRAVENOUS | Status: CP
Start: 2023-08-15 — End: ?

## 2023-08-15 MED ORDER — GLUCOSE 4 G PO CHEW JX
16 g | ORAL | Status: DC | PRN
Start: 2023-08-15 — End: 2023-08-15

## 2023-08-15 MED ORDER — DEXTROMETHORPHAN-GUAIFENESIN 5 ML PO SYRP UD SH
5 mL | GASTROSTOMY | Status: CP | PRN
Start: 2023-08-15 — End: ?

## 2023-08-15 MED ORDER — ASPIRIN 81 MG PO CHEW
81 mg | Freq: Every day | ORAL | Status: DC
Start: 2023-08-15 — End: 2023-08-15

## 2023-08-15 MED ORDER — OXYCODONE ER 9 MG PO C12A
9 mg | Freq: Two times a day (BID) | ORAL | 0 refills | Status: CP
Start: 2023-08-15 — End: ?

## 2023-08-15 MED ORDER — LEVOTHYROXINE SODIUM 50 MCG PO TABS
50 ug | GASTROSTOMY | Status: DC
Start: 2023-08-15 — End: 2023-08-16

## 2023-08-15 MED ORDER — AZITHROMYCIN 500 MG IV MBP/V2B JX
500 mg | Freq: Once | INTRAVENOUS | Status: CP
Start: 2023-08-15 — End: ?

## 2023-08-15 MED ORDER — ORAL CARBOHYDRATE FOR HYPOGLYCEMIA JX
4-8 [oz_av] | ORAL | Status: CP | PRN
Start: 2023-08-15 — End: ?

## 2023-08-15 MED ORDER — ACETAMINOPHEN 325 MG PO TABS
650 mg | GASTROSTOMY | Status: CP | PRN
Start: 2023-08-15 — End: ?

## 2023-08-15 MED ORDER — CEFTRIAXONE 1 G IV MBP
1 g | Freq: Once | INTRAVENOUS | Status: CP
Start: 2023-08-15 — End: ?

## 2023-08-15 MED ORDER — INSULIN ASPART 100 UNIT/ML IJ SOLN
0-3 [IU] | Freq: Three times a day (TID) | SUBCUTANEOUS | Status: CP
Start: 2023-08-15 — End: ?

## 2023-08-15 MED ORDER — POTASSIUM CHLORIDE ER 10 MEQ PO TBCR
10 meq | Freq: Every day | ORAL | Status: SS
Start: 2023-08-15 — End: ?

## 2023-08-15 MED ORDER — APIXABAN 5 MG PO TABS
5 mg | Freq: Two times a day (BID) | GASTROSTOMY | Status: DC
Start: 2023-08-15 — End: 2023-08-16

## 2023-08-15 MED ORDER — TRAZODONE HCL 50 MG PO TABS
50 mg | Freq: Every evening | ORAL | Status: DC | PRN
Start: 2023-08-15 — End: 2023-08-15

## 2023-08-15 MED ORDER — AZITHROMYCIN 500 MG IV MBP/V2B JX
500 mg | INTRAVENOUS | Status: CP
Start: 2023-08-15 — End: ?

## 2023-08-15 MED ORDER — METHOCARBAMOL 500 MG PO TABS
500 mg | Freq: Once | ORAL | Status: CP
Start: 2023-08-15 — End: ?

## 2023-08-15 MED ORDER — ZZ IMS TEMPLATE
75 ug | GASTROSTOMY | Status: DC
Start: 2023-08-15 — End: 2023-08-16

## 2023-08-15 MED ORDER — SODIUM CHLORIDE 0.9 % IV SOLN
1000 mL | INTRAVENOUS | Status: CP
Start: 2023-08-15 — End: ?

## 2023-08-15 MED ORDER — OXYCODONE HCL 10 MG PO TABS
10 mg | GASTROSTOMY | 0 refills | Status: DC | PRN
Start: 2023-08-15 — End: 2023-08-15

## 2023-08-15 MED ORDER — TRAZODONE HCL 50 MG PO TABS
50 mg | Freq: Every evening | GASTROSTOMY | Status: CP | PRN
Start: 2023-08-15 — End: ?

## 2023-08-15 MED ORDER — CEFTRIAXONE 1 G IV MBP
1 g | INTRAVENOUS | Status: CP
Start: 2023-08-15 — End: ?

## 2023-08-15 MED ORDER — GABAPENTIN 300 MG PO CAPS
300 mg | Freq: Once | ORAL | Status: CP
Start: 2023-08-15 — End: ?

## 2023-08-15 MED ORDER — NICOTINE 7 MG/24HR TD PT24
1 | MEDICATED_PATCH | Freq: Every day | TRANSDERMAL | Status: CP
Start: 2023-08-15 — End: ?

## 2023-08-15 MED ORDER — POTASSIUM CHLORIDE CRYS ER 10 MEQ PO TBCR
10 meq | Freq: Every day | ORAL | Status: CP
Start: 2023-08-15 — End: ?

## 2023-08-15 MED ORDER — INSULIN ASPART 100 UNIT/ML IJ SOLN
3 [IU] | Freq: Once | SUBCUTANEOUS | Status: CP
Start: 2023-08-15 — End: ?

## 2023-08-15 MED ORDER — ORAL CARBOHYDRATE FOR HYPOGLYCEMIA JX
4-8 [oz_av] | ORAL | Status: DC | PRN
Start: 2023-08-15 — End: 2023-08-15

## 2023-08-15 MED ORDER — DEXTROMETHORPHAN-GUAIFENESIN 5 ML PO SYRP UD SH
5 mL | ORAL | Status: DC | PRN
Start: 2023-08-15 — End: 2023-08-15

## 2023-08-15 MED ORDER — APIXABAN 5 MG PO TABS
5 mg | Freq: Two times a day (BID) | ORAL | Status: CP
Start: 2023-08-15 — End: ?

## 2023-08-15 MED ORDER — NICOTINE PATCH REMOVAL
1 | MEDICATED_PATCH | Freq: Every day | TRANSDERMAL | Status: CP
Start: 2023-08-15 — End: ?

## 2023-08-16 MED ORDER — GUAIFENESIN ER 600 MG PO TB12
600 mg | Freq: Two times a day (BID) | ORAL | Status: CP
Start: 2023-08-16 — End: ?

## 2023-08-16 MED ORDER — AZITHROMYCIN 500 MG IV MBP/V2B JX
500 mg | INTRAVENOUS | Status: CP
Start: 2023-08-16 — End: ?

## 2023-08-16 MED ORDER — PATIENT MANAGED INSULIN PUMP 100 UNITS/ML JX
.5 [IU]/h | SUBCUTANEOUS | Status: CP
Start: 2023-08-16 — End: ?

## 2023-08-16 MED ORDER — DIPHENHYDRAMINE HCL 50 MG/ML IJ SOLN
25 mg | Freq: Four times a day (QID) | INTRAVENOUS | Status: CP | PRN
Start: 2023-08-16 — End: ?

## 2023-08-17 MED ORDER — NICOTINE PATCH REMOVAL
1 | MEDICATED_PATCH | Freq: Every day | TRANSDERMAL | Status: CP
Start: 2023-08-17 — End: ?

## 2023-08-17 MED ORDER — AZITHROMYCIN 250 MG PO TABS
500 mg | ORAL | Status: CP
Start: 2023-08-17 — End: ?

## 2023-08-17 MED ORDER — NICOTINE 21 MG/24HR TD PT24
1 | MEDICATED_PATCH | Freq: Every day | TRANSDERMAL | Status: CP
Start: 2023-08-17 — End: ?

## 2023-08-19 ENCOUNTER — Encounter: Attending: Surgical Critical Care | Primary: Geriatric Medicine

## 2023-08-19 MED ORDER — CEFTRIAXONE 1 G IV MBP
1 g | INTRAVENOUS | Status: DC
Start: 2023-08-19 — End: 2023-08-19

## 2023-09-01 ENCOUNTER — Ambulatory Visit: Primary: Geriatric Medicine

## 2023-09-01 DIAGNOSIS — R29898 Other symptoms and signs involving the musculoskeletal system: Secondary | ICD-10-CM

## 2023-09-01 DIAGNOSIS — M21331 Wrist drop, right wrist: Principal | ICD-10-CM

## 2023-09-09 DIAGNOSIS — R112 Nausea with vomiting, unspecified: Principal | ICD-10-CM

## 2023-09-10 MED ORDER — PROMETHAZINE HCL 6.25 MG/5ML PO SOLN
10 refills
Start: 2023-09-10 — End: ?

## 2023-09-21 ENCOUNTER — Inpatient Hospital Stay: Admit: 2023-09-21 | Discharge: 2023-09-21 | Primary: Geriatric Medicine

## 2023-09-21 DIAGNOSIS — R29898 Other symptoms and signs involving the musculoskeletal system: Secondary | ICD-10-CM

## 2023-09-21 DIAGNOSIS — G5631 Lesion of radial nerve, right upper limb: Secondary | ICD-10-CM

## 2023-09-21 DIAGNOSIS — M21331 Wrist drop, right wrist: Principal | ICD-10-CM

## 2023-09-22 ENCOUNTER — Inpatient Hospital Stay: Admit: 2023-09-22 | Discharge: 2023-09-23 | Primary: Geriatric Medicine

## 2023-09-22 DIAGNOSIS — G5631 Lesion of radial nerve, right upper limb: Secondary | ICD-10-CM

## 2023-09-22 DIAGNOSIS — R29898 Other symptoms and signs involving the musculoskeletal system: Secondary | ICD-10-CM

## 2023-09-22 DIAGNOSIS — M21331 Wrist drop, right wrist: Principal | ICD-10-CM

## 2023-09-22 MED ORDER — GADOTERATE MEGLUMINE 10 MMOL/20ML IV SOLN
12 mL | Freq: Once | INTRAVENOUS | Status: CP
Start: 2023-09-22 — End: ?

## 2023-09-26 ENCOUNTER — Inpatient Hospital Stay: Admit: 2023-09-26 | Discharge: 2023-09-27 | Primary: Geriatric Medicine

## 2023-09-26 ENCOUNTER — Ambulatory Visit: Attending: Orthopaedic Surgery | Primary: Geriatric Medicine

## 2023-09-26 ENCOUNTER — Inpatient Hospital Stay: Admit: 2023-09-26 | Discharge: 2023-10-06 | Payer: MEDICARE | Primary: Geriatric Medicine

## 2023-09-26 ENCOUNTER — Inpatient Hospital Stay: Admit: 2023-09-26 | Discharge: 2023-10-19 | Payer: MEDICARE | Primary: Geriatric Medicine

## 2023-09-26 ENCOUNTER — Inpatient Hospital Stay: Admit: 2023-09-26 | Discharge: 2023-10-11 | Payer: MEDICARE | Primary: Geriatric Medicine

## 2023-09-26 ENCOUNTER — Encounter: Primary: Geriatric Medicine

## 2023-09-26 DIAGNOSIS — G5631 Lesion of radial nerve, right upper limb: Secondary | ICD-10-CM

## 2023-09-26 DIAGNOSIS — R112 Nausea with vomiting, unspecified: Secondary | ICD-10-CM

## 2023-09-26 DIAGNOSIS — I739 Peripheral vascular disease, unspecified: Secondary | ICD-10-CM

## 2023-09-26 DIAGNOSIS — E119 Type 2 diabetes mellitus without complications: Principal | ICD-10-CM

## 2023-09-26 DIAGNOSIS — K222 Esophageal obstruction: Secondary | ICD-10-CM

## 2023-09-26 DIAGNOSIS — R29898 Other symptoms and signs involving the musculoskeletal system: Principal | ICD-10-CM

## 2023-09-26 DIAGNOSIS — K859 Acute pancreatitis without necrosis or infection, unspecified: Secondary | ICD-10-CM

## 2023-09-26 DIAGNOSIS — E786 Lipoprotein deficiency: Secondary | ICD-10-CM

## 2023-09-26 DIAGNOSIS — K219 Gastro-esophageal reflux disease without esophagitis: Secondary | ICD-10-CM

## 2023-09-26 DIAGNOSIS — E063 Autoimmune thyroiditis: Secondary | ICD-10-CM

## 2023-09-26 DIAGNOSIS — M549 Dorsalgia, unspecified: Secondary | ICD-10-CM

## 2023-09-26 DIAGNOSIS — E559 Vitamin D deficiency, unspecified: Secondary | ICD-10-CM

## 2023-09-26 DIAGNOSIS — K8689 Other specified diseases of pancreas: Secondary | ICD-10-CM

## 2023-10-14 ENCOUNTER — Ambulatory Visit: Payer: MEDICARE | Attending: Surgical Critical Care | Primary: Geriatric Medicine

## 2023-10-14 DIAGNOSIS — K8689 Other specified diseases of pancreas: Secondary | ICD-10-CM

## 2023-10-14 DIAGNOSIS — E786 Lipoprotein deficiency: Secondary | ICD-10-CM

## 2023-10-14 DIAGNOSIS — E559 Vitamin D deficiency, unspecified: Secondary | ICD-10-CM

## 2023-10-14 DIAGNOSIS — Z9189 Other specified personal risk factors, not elsewhere classified: Secondary | ICD-10-CM

## 2023-10-14 DIAGNOSIS — F509 Eating disorder, unspecified: Secondary | ICD-10-CM

## 2023-10-14 DIAGNOSIS — M549 Dorsalgia, unspecified: Secondary | ICD-10-CM

## 2023-10-14 DIAGNOSIS — Z889 Allergy status to unspecified drugs, medicaments and biological substances status: Secondary | ICD-10-CM

## 2023-10-14 DIAGNOSIS — M1990 Unspecified osteoarthritis, unspecified site: Secondary | ICD-10-CM

## 2023-10-14 DIAGNOSIS — K859 Acute pancreatitis without necrosis or infection, unspecified: Secondary | ICD-10-CM

## 2023-10-14 DIAGNOSIS — I739 Peripheral vascular disease, unspecified: Secondary | ICD-10-CM

## 2023-10-14 DIAGNOSIS — R112 Nausea with vomiting, unspecified: Secondary | ICD-10-CM

## 2023-10-14 DIAGNOSIS — Z5189 Encounter for other specified aftercare: Secondary | ICD-10-CM

## 2023-10-14 DIAGNOSIS — E119 Type 2 diabetes mellitus without complications: Principal | ICD-10-CM

## 2023-10-14 DIAGNOSIS — E063 Autoimmune thyroiditis: Secondary | ICD-10-CM

## 2023-10-14 DIAGNOSIS — F909 Attention-deficit hyperactivity disorder, unspecified type: Secondary | ICD-10-CM

## 2023-10-14 DIAGNOSIS — K222 Esophageal obstruction: Secondary | ICD-10-CM

## 2023-10-14 DIAGNOSIS — Z789 Other specified health status: Principal | ICD-10-CM

## 2023-10-14 DIAGNOSIS — K219 Gastro-esophageal reflux disease without esophagitis: Secondary | ICD-10-CM

## 2023-10-26 ENCOUNTER — Inpatient Hospital Stay: Admit: 2023-10-26 | Discharge: 2023-11-17 | Payer: MEDICARE | Primary: Geriatric Medicine

## 2023-10-26 ENCOUNTER — Inpatient Hospital Stay: Admit: 2023-10-26 | Discharge: 2023-11-10 | Payer: MEDICARE | Primary: Geriatric Medicine

## 2023-10-26 ENCOUNTER — Inpatient Hospital Stay: Admit: 2023-10-26 | Discharge: 2023-10-27 | Payer: MEDICARE | Primary: Geriatric Medicine

## 2023-10-26 ENCOUNTER — Encounter: Admit: 2023-10-26 | Payer: MEDICARE | Primary: Geriatric Medicine

## 2023-11-03 ENCOUNTER — Ambulatory Visit: Payer: MEDICARE | Primary: Geriatric Medicine

## 2023-11-03 DIAGNOSIS — K219 Gastro-esophageal reflux disease without esophagitis: Secondary | ICD-10-CM

## 2023-11-03 DIAGNOSIS — E559 Vitamin D deficiency, unspecified: Secondary | ICD-10-CM

## 2023-11-03 DIAGNOSIS — Z789 Other specified health status: Principal | ICD-10-CM

## 2023-11-03 DIAGNOSIS — I739 Peripheral vascular disease, unspecified: Secondary | ICD-10-CM

## 2023-11-03 DIAGNOSIS — M1990 Unspecified osteoarthritis, unspecified site: Secondary | ICD-10-CM

## 2023-11-03 DIAGNOSIS — Z5189 Encounter for other specified aftercare: Secondary | ICD-10-CM

## 2023-11-03 DIAGNOSIS — F509 Eating disorder, unspecified: Secondary | ICD-10-CM

## 2023-11-03 DIAGNOSIS — Z9189 Other specified personal risk factors, not elsewhere classified: Secondary | ICD-10-CM

## 2023-11-03 DIAGNOSIS — F909 Attention-deficit hyperactivity disorder, unspecified type: Secondary | ICD-10-CM

## 2023-11-03 DIAGNOSIS — M549 Dorsalgia, unspecified: Secondary | ICD-10-CM

## 2023-11-03 DIAGNOSIS — K859 Acute pancreatitis without necrosis or infection, unspecified: Secondary | ICD-10-CM

## 2023-11-03 DIAGNOSIS — E063 Autoimmune thyroiditis: Secondary | ICD-10-CM

## 2023-11-03 DIAGNOSIS — K222 Esophageal obstruction: Secondary | ICD-10-CM

## 2023-11-03 DIAGNOSIS — E786 Lipoprotein deficiency: Secondary | ICD-10-CM

## 2023-11-03 DIAGNOSIS — E119 Type 2 diabetes mellitus without complications: Principal | ICD-10-CM

## 2023-11-03 DIAGNOSIS — K8689 Other specified diseases of pancreas: Secondary | ICD-10-CM

## 2023-11-03 DIAGNOSIS — R112 Nausea with vomiting, unspecified: Secondary | ICD-10-CM

## 2023-11-03 DIAGNOSIS — Z889 Allergy status to unspecified drugs, medicaments and biological substances status: Secondary | ICD-10-CM

## 2023-11-03 MED ORDER — INSULIN ASPART 100 UNIT/ML IJ SOLN
0-7 [IU] | Freq: Once | SUBCUTANEOUS
Start: 2023-11-03 — End: ?

## 2023-11-03 MED ORDER — LACTATED RINGERS IV SOLN
1000 mL | Freq: Once | INTRAVENOUS
Start: 2023-11-03 — End: ?

## 2023-11-03 MED ORDER — NALOXONE HCL 0.4 MG/ML IJ SOLN
.2 mg | INTRAVENOUS | PRN
Start: 2023-11-03 — End: ?

## 2023-11-04 ENCOUNTER — Ambulatory Visit: Payer: MEDICARE | Attending: Anesthesiology

## 2023-11-04 ENCOUNTER — Ambulatory Visit: Admit: 2023-11-04 | Discharge: 2023-11-04 | Payer: MEDICARE | Primary: Geriatric Medicine

## 2023-11-04 DIAGNOSIS — I739 Peripheral vascular disease, unspecified: Secondary | ICD-10-CM

## 2023-11-04 DIAGNOSIS — K8689 Other specified diseases of pancreas: Secondary | ICD-10-CM

## 2023-11-04 DIAGNOSIS — F909 Attention-deficit hyperactivity disorder, unspecified type: Secondary | ICD-10-CM

## 2023-11-04 DIAGNOSIS — Z5189 Encounter for other specified aftercare: Secondary | ICD-10-CM

## 2023-11-04 DIAGNOSIS — K219 Gastro-esophageal reflux disease without esophagitis: Secondary | ICD-10-CM

## 2023-11-04 DIAGNOSIS — Z889 Allergy status to unspecified drugs, medicaments and biological substances status: Secondary | ICD-10-CM

## 2023-11-04 DIAGNOSIS — Z9189 Other specified personal risk factors, not elsewhere classified: Secondary | ICD-10-CM

## 2023-11-04 DIAGNOSIS — E559 Vitamin D deficiency, unspecified: Secondary | ICD-10-CM

## 2023-11-04 DIAGNOSIS — F509 Eating disorder, unspecified: Secondary | ICD-10-CM

## 2023-11-04 DIAGNOSIS — K859 Acute pancreatitis without necrosis or infection, unspecified: Secondary | ICD-10-CM

## 2023-11-04 DIAGNOSIS — M549 Dorsalgia, unspecified: Secondary | ICD-10-CM

## 2023-11-04 DIAGNOSIS — E786 Lipoprotein deficiency: Secondary | ICD-10-CM

## 2023-11-04 DIAGNOSIS — E063 Autoimmune thyroiditis: Secondary | ICD-10-CM

## 2023-11-04 DIAGNOSIS — K222 Esophageal obstruction: Secondary | ICD-10-CM

## 2023-11-04 DIAGNOSIS — R112 Nausea with vomiting, unspecified: Secondary | ICD-10-CM

## 2023-11-04 DIAGNOSIS — E119 Type 2 diabetes mellitus without complications: Principal | ICD-10-CM

## 2023-11-04 DIAGNOSIS — M1990 Unspecified osteoarthritis, unspecified site: Secondary | ICD-10-CM

## 2023-11-04 MED ORDER — PROPOFOL INFUSION JX
INTRAVENOUS | Status: DC | PRN
Start: 2023-11-04 — End: 2023-11-04

## 2023-11-04 MED ORDER — LIDOCAINE HCL (PF) 1 % IJ SOLN SH
INTRAVENOUS | Status: DC | PRN
Start: 2023-11-04 — End: 2023-11-04

## 2023-11-04 MED ORDER — LACTATED RINGERS IV SOLN
INTRAVENOUS | Status: DC | PRN
Start: 2023-11-04 — End: 2023-11-04

## 2023-11-04 MED ORDER — SCOPOLAMINE 1 MG/3DAYS TD PT72
1 | MEDICATED_PATCH | Freq: Once | TRANSDERMAL | Status: CP
Start: 2023-11-04 — End: ?

## 2023-11-04 MED ORDER — PROPOFOL 10 MG/ML IV EMUL CUSTOM SH
INTRAVENOUS | Status: DC | PRN
Start: 2023-11-04 — End: 2023-11-04

## 2023-11-07 ENCOUNTER — Observation Stay: Payer: MEDICARE

## 2023-11-07 ENCOUNTER — Observation Stay: Admit: 2023-11-07 | Discharge: 2023-11-07 | Payer: MEDICARE | Primary: Geriatric Medicine

## 2023-11-07 ENCOUNTER — Inpatient Hospital Stay: Payer: MEDICARE

## 2023-11-07 DIAGNOSIS — M1990 Unspecified osteoarthritis, unspecified site: Secondary | ICD-10-CM

## 2023-11-07 DIAGNOSIS — E063 Autoimmune thyroiditis: Secondary | ICD-10-CM

## 2023-11-07 DIAGNOSIS — Z789 Other specified health status: Secondary | ICD-10-CM

## 2023-11-07 DIAGNOSIS — T85528A Displacement of other gastrointestinal prosthetic devices, implants and grafts, initial encounter: Principal | ICD-10-CM

## 2023-11-07 DIAGNOSIS — F909 Attention-deficit hyperactivity disorder, unspecified type: Secondary | ICD-10-CM

## 2023-11-07 DIAGNOSIS — Z5189 Encounter for other specified aftercare: Secondary | ICD-10-CM

## 2023-11-07 DIAGNOSIS — R112 Nausea with vomiting, unspecified: Secondary | ICD-10-CM

## 2023-11-07 DIAGNOSIS — I10 Essential (primary) hypertension: Secondary | ICD-10-CM

## 2023-11-07 DIAGNOSIS — K859 Acute pancreatitis without necrosis or infection, unspecified: Secondary | ICD-10-CM

## 2023-11-07 DIAGNOSIS — M549 Dorsalgia, unspecified: Secondary | ICD-10-CM

## 2023-11-07 DIAGNOSIS — R109 Unspecified abdominal pain: Secondary | ICD-10-CM

## 2023-11-07 DIAGNOSIS — E786 Lipoprotein deficiency: Secondary | ICD-10-CM

## 2023-11-07 DIAGNOSIS — F509 Eating disorder, unspecified: Secondary | ICD-10-CM

## 2023-11-07 DIAGNOSIS — I739 Peripheral vascular disease, unspecified: Secondary | ICD-10-CM

## 2023-11-07 DIAGNOSIS — K222 Esophageal obstruction: Secondary | ICD-10-CM

## 2023-11-07 DIAGNOSIS — K8689 Other specified diseases of pancreas: Secondary | ICD-10-CM

## 2023-11-07 DIAGNOSIS — E559 Vitamin D deficiency, unspecified: Secondary | ICD-10-CM

## 2023-11-07 DIAGNOSIS — E119 Type 2 diabetes mellitus without complications: Principal | ICD-10-CM

## 2023-11-07 DIAGNOSIS — K219 Gastro-esophageal reflux disease without esophagitis: Secondary | ICD-10-CM

## 2023-11-07 DIAGNOSIS — Z889 Allergy status to unspecified drugs, medicaments and biological substances status: Secondary | ICD-10-CM

## 2023-11-07 DIAGNOSIS — Z9189 Other specified personal risk factors, not elsewhere classified: Secondary | ICD-10-CM

## 2023-11-07 MED ORDER — LACTATED RINGERS IV SOLN
INTRAVENOUS | Status: DC | PRN
Start: 2023-11-07 — End: 2023-11-07

## 2023-11-07 MED ORDER — HYDROMORPHONE HCL 1 MG/ML IJ SOLN
1 mg | Freq: Once | INTRAVENOUS | Status: CP
Start: 2023-11-07 — End: ?

## 2023-11-07 MED ORDER — LACTATED RINGERS IV SOLN
INTRAVENOUS | Status: DC
Start: 2023-11-07 — End: 2023-11-07

## 2023-11-07 MED ORDER — SCOPOLAMINE PATCH REMOVAL
1 | MEDICATED_PATCH | Freq: Once | TRANSDERMAL | Status: CP
Start: 2023-11-07 — End: ?

## 2023-11-07 MED ORDER — SCOPOLAMINE 1 MG/3DAYS TD PT72
1 | MEDICATED_PATCH | Freq: Once | TRANSDERMAL | Status: CP
Start: 2023-11-07 — End: ?

## 2023-11-07 MED ORDER — LIDOCAINE HCL (PF) 1 % IJ SOLN SH
INTRAVENOUS | Status: DC | PRN
Start: 2023-11-07 — End: 2023-11-07

## 2023-11-07 MED ORDER — HYDROMORPHONE HCL 1 MG/ML IJ SOLN
1 mg | INTRAVENOUS | Status: DC | PRN
Start: 2023-11-07 — End: 2023-11-07

## 2023-11-07 MED ORDER — DIPHENHYDRAMINE HCL 50 MG/ML IJ SOLN
25 mg | Freq: Once | INTRAVENOUS | Status: CP
Start: 2023-11-07 — End: ?

## 2023-11-07 MED ORDER — EPHEDRINE SULFATE-NACL (PF) 5-0.9 MG/ML-% IV SOSY SH
INTRAVENOUS | Status: DC | PRN
Start: 2023-11-07 — End: 2023-11-07

## 2023-11-07 MED ORDER — PHENYLEPHRINE HCL-NACL 0.1-0.9 MG/ML-% IV SOLN SH
INTRAVENOUS | Status: DC | PRN
Start: 2023-11-07 — End: 2023-11-07

## 2023-11-07 MED ORDER — PROPOFOL INFUSION JX
INTRAVENOUS | Status: DC | PRN
Start: 2023-11-07 — End: 2023-11-07

## 2023-11-07 MED ORDER — SUGAMMADEX SODIUM 200 MG/2ML IV SOLN
INTRAVENOUS | Status: DC | PRN
Start: 2023-11-07 — End: 2023-11-07

## 2023-11-07 MED ORDER — ROCURONIUM BROMIDE 50 MG/5ML IV SOSY
INTRAVENOUS | Status: DC | PRN
Start: 2023-11-07 — End: 2023-11-07

## 2023-11-07 MED ORDER — ONDANSETRON HCL 4 MG/2 ML IJ SOLN SH
4 mg | Freq: Once | INTRAVENOUS | Status: CP
Start: 2023-11-07 — End: ?

## 2023-11-07 MED ORDER — ONDANSETRON HCL 4 MG/2 ML IJ SOLN SH
4 mg | Freq: Four times a day (QID) | INTRAVENOUS | Status: DC | PRN
Start: 2023-11-07 — End: 2023-11-07

## 2023-11-07 MED ORDER — FENTANYL CITRATE INJ 50 MCG/ML CUSTOM AMP/VIAL
INTRAVENOUS | Status: DC | PRN
Start: 2023-11-07 — End: 2023-11-07

## 2023-11-07 MED ORDER — PROCHLORPERAZINE EDISYLATE 10 MG/2ML IJ SOLN
10 mg | Freq: Once | INTRAVENOUS | Status: CP
Start: 2023-11-07 — End: ?

## 2023-11-07 MED ORDER — PROPOFOL 10 MG/ML IV EMUL CUSTOM SH
INTRAVENOUS | Status: DC | PRN
Start: 2023-11-07 — End: 2023-11-07

## 2023-11-07 MED ORDER — ONDANSETRON HCL 4 MG/2 ML IJ SOLN SH
4 mg | INTRAVENOUS | Status: DC | PRN
Start: 2023-11-07 — End: 2023-11-07

## 2023-11-08 DIAGNOSIS — R112 Nausea with vomiting, unspecified: Secondary | ICD-10-CM

## 2023-11-08 DIAGNOSIS — M549 Dorsalgia, unspecified: Secondary | ICD-10-CM

## 2023-11-08 DIAGNOSIS — K222 Esophageal obstruction: Secondary | ICD-10-CM

## 2023-11-08 DIAGNOSIS — E119 Type 2 diabetes mellitus without complications: Principal | ICD-10-CM

## 2023-11-08 DIAGNOSIS — E063 Autoimmune thyroiditis: Secondary | ICD-10-CM

## 2023-11-08 DIAGNOSIS — M1990 Unspecified osteoarthritis, unspecified site: Secondary | ICD-10-CM

## 2023-11-08 DIAGNOSIS — Z5189 Encounter for other specified aftercare: Secondary | ICD-10-CM

## 2023-11-08 DIAGNOSIS — K859 Acute pancreatitis without necrosis or infection, unspecified: Secondary | ICD-10-CM

## 2023-11-08 DIAGNOSIS — K219 Gastro-esophageal reflux disease without esophagitis: Secondary | ICD-10-CM

## 2023-11-08 DIAGNOSIS — Z9189 Other specified personal risk factors, not elsewhere classified: Secondary | ICD-10-CM

## 2023-11-08 DIAGNOSIS — I739 Peripheral vascular disease, unspecified: Secondary | ICD-10-CM

## 2023-11-08 DIAGNOSIS — F509 Eating disorder, unspecified: Secondary | ICD-10-CM

## 2023-11-08 DIAGNOSIS — K8689 Other specified diseases of pancreas: Secondary | ICD-10-CM

## 2023-11-08 DIAGNOSIS — E786 Lipoprotein deficiency: Secondary | ICD-10-CM

## 2023-11-08 DIAGNOSIS — Z889 Allergy status to unspecified drugs, medicaments and biological substances status: Secondary | ICD-10-CM

## 2023-11-08 DIAGNOSIS — F909 Attention-deficit hyperactivity disorder, unspecified type: Secondary | ICD-10-CM

## 2023-11-08 DIAGNOSIS — E559 Vitamin D deficiency, unspecified: Secondary | ICD-10-CM

## 2023-11-23 ENCOUNTER — Inpatient Hospital Stay: Admit: 2023-11-23 | Discharge: 2023-12-08 | Payer: MEDICARE | Primary: Geriatric Medicine

## 2023-11-23 ENCOUNTER — Inpatient Hospital Stay: Admit: 2023-11-23 | Payer: MEDICARE | Primary: Geriatric Medicine

## 2023-11-23 ENCOUNTER — Inpatient Hospital Stay: Admit: 2023-11-23 | Discharge: 2023-11-24 | Payer: MEDICARE | Primary: Geriatric Medicine

## 2023-11-28 ENCOUNTER — Ambulatory Visit: Payer: MEDICARE | Attending: Orthopaedic Surgery | Primary: Geriatric Medicine

## 2023-11-28 DIAGNOSIS — G5631 Lesion of radial nerve, right upper limb: Principal | ICD-10-CM

## 2023-11-28 DIAGNOSIS — R29898 Other symptoms and signs involving the musculoskeletal system: Secondary | ICD-10-CM

## 2023-12-01 ENCOUNTER — Encounter: Payer: MEDICARE | Attending: Neurology | Primary: Geriatric Medicine

## 2023-12-01 ENCOUNTER — Encounter: Payer: MEDICARE | Primary: Geriatric Medicine

## 2023-12-14 ENCOUNTER — Ambulatory Visit: Admit: 2023-12-14 | Discharge: 2023-12-15 | Payer: MEDICARE | Primary: Geriatric Medicine

## 2023-12-14 DIAGNOSIS — E119 Type 2 diabetes mellitus without complications: Principal | ICD-10-CM

## 2023-12-14 DIAGNOSIS — E786 Lipoprotein deficiency: Secondary | ICD-10-CM

## 2023-12-14 DIAGNOSIS — I739 Peripheral vascular disease, unspecified: Secondary | ICD-10-CM

## 2023-12-14 DIAGNOSIS — E063 Autoimmune thyroiditis: Secondary | ICD-10-CM

## 2023-12-14 DIAGNOSIS — M549 Dorsalgia, unspecified: Secondary | ICD-10-CM

## 2023-12-14 DIAGNOSIS — K8689 Other specified diseases of pancreas: Secondary | ICD-10-CM

## 2023-12-14 DIAGNOSIS — Z889 Allergy status to unspecified drugs, medicaments and biological substances status: Secondary | ICD-10-CM

## 2023-12-14 DIAGNOSIS — M1990 Unspecified osteoarthritis, unspecified site: Secondary | ICD-10-CM

## 2023-12-14 DIAGNOSIS — Z9189 Other specified personal risk factors, not elsewhere classified: Secondary | ICD-10-CM

## 2023-12-14 DIAGNOSIS — F909 Attention-deficit hyperactivity disorder, unspecified type: Secondary | ICD-10-CM

## 2023-12-14 DIAGNOSIS — F509 Eating disorder, unspecified: Secondary | ICD-10-CM

## 2023-12-14 DIAGNOSIS — R112 Nausea with vomiting, unspecified: Secondary | ICD-10-CM

## 2023-12-14 DIAGNOSIS — Z5189 Encounter for other specified aftercare: Secondary | ICD-10-CM

## 2023-12-14 DIAGNOSIS — K222 Esophageal obstruction: Secondary | ICD-10-CM

## 2023-12-14 DIAGNOSIS — K219 Gastro-esophageal reflux disease without esophagitis: Secondary | ICD-10-CM

## 2023-12-14 DIAGNOSIS — E559 Vitamin D deficiency, unspecified: Secondary | ICD-10-CM

## 2023-12-14 DIAGNOSIS — R202 Paresthesia of skin: Principal | ICD-10-CM

## 2023-12-14 DIAGNOSIS — K859 Acute pancreatitis without necrosis or infection, unspecified: Secondary | ICD-10-CM

## 2023-12-14 MED ORDER — TORSEMIDE 20 MG PO TABS
10 mg | Freq: Every day | ORAL
Start: 2023-12-14 — End: ?

## 2023-12-28 ENCOUNTER — Encounter: Admit: 2023-12-28 | Payer: MEDICARE | Primary: Geriatric Medicine

## 2023-12-28 ENCOUNTER — Inpatient Hospital Stay: Admit: 2023-12-28 | Payer: MEDICARE | Primary: Geriatric Medicine

## 2024-02-09 ENCOUNTER — Ambulatory Visit: Payer: MEDICARE | Primary: Geriatric Medicine

## 2024-02-09 DIAGNOSIS — I739 Peripheral vascular disease, unspecified: Secondary | ICD-10-CM

## 2024-02-09 DIAGNOSIS — M1990 Unspecified osteoarthritis, unspecified site: Secondary | ICD-10-CM

## 2024-02-09 DIAGNOSIS — M549 Dorsalgia, unspecified: Secondary | ICD-10-CM

## 2024-02-09 DIAGNOSIS — E786 Lipoprotein deficiency: Secondary | ICD-10-CM

## 2024-02-09 DIAGNOSIS — R112 Nausea with vomiting, unspecified: Secondary | ICD-10-CM

## 2024-02-09 DIAGNOSIS — Z889 Allergy status to unspecified drugs, medicaments and biological substances status: Secondary | ICD-10-CM

## 2024-02-09 DIAGNOSIS — F509 Eating disorder, unspecified: Secondary | ICD-10-CM

## 2024-02-09 DIAGNOSIS — E559 Vitamin D deficiency, unspecified: Secondary | ICD-10-CM

## 2024-02-09 DIAGNOSIS — K219 Gastro-esophageal reflux disease without esophagitis: Secondary | ICD-10-CM

## 2024-02-09 DIAGNOSIS — K222 Esophageal obstruction: Secondary | ICD-10-CM

## 2024-02-09 DIAGNOSIS — Z789 Other specified health status: Principal | ICD-10-CM

## 2024-02-09 DIAGNOSIS — K859 Acute pancreatitis without necrosis or infection, unspecified: Secondary | ICD-10-CM

## 2024-02-09 DIAGNOSIS — Z5189 Encounter for other specified aftercare: Secondary | ICD-10-CM

## 2024-02-09 DIAGNOSIS — E119 Type 2 diabetes mellitus without complications: Principal | ICD-10-CM

## 2024-02-09 DIAGNOSIS — F909 Attention-deficit hyperactivity disorder, unspecified type: Secondary | ICD-10-CM

## 2024-02-09 DIAGNOSIS — E063 Autoimmune thyroiditis: Secondary | ICD-10-CM

## 2024-02-09 DIAGNOSIS — K8689 Other specified diseases of pancreas: Secondary | ICD-10-CM

## 2024-02-09 DIAGNOSIS — Z9189 Other specified personal risk factors, not elsewhere classified: Secondary | ICD-10-CM

## 2024-02-09 MED ORDER — NALOXONE HCL 0.4 MG/ML IJ SOLN
.2 mg | INTRAVENOUS | PRN
Start: 2024-02-09 — End: ?

## 2024-02-28 ENCOUNTER — Encounter: Payer: MEDICARE | Attending: "Nutrition | Primary: Geriatric Medicine

## 2024-03-07 ENCOUNTER — Encounter: Payer: MEDICARE | Attending: Orthopaedic Surgery | Primary: Geriatric Medicine

## 2024-04-02 ENCOUNTER — Inpatient Hospital Stay: Admit: 2024-04-02 | Discharge: 2024-04-02 | Payer: MEDICARE | Attending: Neurology | Primary: Geriatric Medicine

## 2024-04-02 DIAGNOSIS — Z931 Gastrostomy status: Principal | ICD-10-CM

## 2024-04-02 DIAGNOSIS — R202 Paresthesia of skin: Principal | ICD-10-CM

## 2024-04-05 DIAGNOSIS — Z931 Gastrostomy status: Principal | ICD-10-CM

## 2024-04-17 ENCOUNTER — Ambulatory Visit: Admit: 2024-04-17 | Discharge: 2024-04-18 | Payer: MEDICARE | Primary: Geriatric Medicine

## 2024-04-17 DIAGNOSIS — F509 Eating disorder, unspecified: Secondary | ICD-10-CM

## 2024-04-17 DIAGNOSIS — G629 Polyneuropathy, unspecified: Secondary | ICD-10-CM

## 2024-04-17 DIAGNOSIS — M549 Dorsalgia, unspecified: Secondary | ICD-10-CM

## 2024-04-17 DIAGNOSIS — E786 Lipoprotein deficiency: Secondary | ICD-10-CM

## 2024-04-17 DIAGNOSIS — E559 Vitamin D deficiency, unspecified: Secondary | ICD-10-CM

## 2024-04-17 DIAGNOSIS — Z9189 Other specified personal risk factors, not elsewhere classified: Secondary | ICD-10-CM

## 2024-04-17 DIAGNOSIS — M1990 Unspecified osteoarthritis, unspecified site: Secondary | ICD-10-CM

## 2024-04-17 DIAGNOSIS — E119 Type 2 diabetes mellitus without complications: Principal | ICD-10-CM

## 2024-04-17 DIAGNOSIS — R29898 Other symptoms and signs involving the musculoskeletal system: Secondary | ICD-10-CM

## 2024-04-17 DIAGNOSIS — G5631 Lesion of radial nerve, right upper limb: Secondary | ICD-10-CM

## 2024-04-17 DIAGNOSIS — K219 Gastro-esophageal reflux disease without esophagitis: Secondary | ICD-10-CM

## 2024-04-17 DIAGNOSIS — R202 Paresthesia of skin: Principal | ICD-10-CM

## 2024-04-17 DIAGNOSIS — Z889 Allergy status to unspecified drugs, medicaments and biological substances status: Secondary | ICD-10-CM

## 2024-04-17 DIAGNOSIS — E063 Autoimmune thyroiditis: Secondary | ICD-10-CM

## 2024-04-17 DIAGNOSIS — K222 Esophageal obstruction: Secondary | ICD-10-CM

## 2024-04-17 DIAGNOSIS — K8689 Other specified diseases of pancreas: Secondary | ICD-10-CM

## 2024-04-17 DIAGNOSIS — K859 Acute pancreatitis without necrosis or infection, unspecified: Secondary | ICD-10-CM

## 2024-04-17 DIAGNOSIS — I739 Peripheral vascular disease, unspecified: Secondary | ICD-10-CM

## 2024-04-17 DIAGNOSIS — R112 Nausea with vomiting, unspecified: Secondary | ICD-10-CM

## 2024-04-17 DIAGNOSIS — F909 Attention-deficit hyperactivity disorder, unspecified type: Secondary | ICD-10-CM

## 2024-04-17 DIAGNOSIS — Z5189 Encounter for other specified aftercare: Secondary | ICD-10-CM

## 2024-04-17 MED ORDER — NORTRIPTYLINE HCL 10 MG PO CAPS
20 mg | Freq: Every evening | ORAL | 5 refills | 30.00000 days | Status: CP
Start: 2024-04-17 — End: ?

## 2024-04-23 DIAGNOSIS — Z931 Gastrostomy status: Principal | ICD-10-CM

## 2024-04-27 ENCOUNTER — Ambulatory Visit: Payer: MEDICARE | Attending: "Nutrition | Primary: Geriatric Medicine

## 2024-04-27 DIAGNOSIS — E119 Type 2 diabetes mellitus without complications: Principal | ICD-10-CM

## 2024-04-27 DIAGNOSIS — E559 Vitamin D deficiency, unspecified: Secondary | ICD-10-CM

## 2024-04-27 DIAGNOSIS — E063 Autoimmune thyroiditis: Secondary | ICD-10-CM

## 2024-04-27 DIAGNOSIS — K8689 Other specified diseases of pancreas: Secondary | ICD-10-CM

## 2024-04-27 DIAGNOSIS — K222 Esophageal obstruction: Secondary | ICD-10-CM

## 2024-04-27 DIAGNOSIS — M1990 Unspecified osteoarthritis, unspecified site: Secondary | ICD-10-CM

## 2024-04-27 DIAGNOSIS — F509 Eating disorder, unspecified: Secondary | ICD-10-CM

## 2024-04-27 DIAGNOSIS — Z5189 Encounter for other specified aftercare: Secondary | ICD-10-CM

## 2024-04-27 DIAGNOSIS — F909 Attention-deficit hyperactivity disorder, unspecified type: Secondary | ICD-10-CM

## 2024-04-27 DIAGNOSIS — Z889 Allergy status to unspecified drugs, medicaments and biological substances status: Secondary | ICD-10-CM

## 2024-04-27 DIAGNOSIS — K219 Gastro-esophageal reflux disease without esophagitis: Secondary | ICD-10-CM

## 2024-04-27 DIAGNOSIS — R112 Nausea with vomiting, unspecified: Secondary | ICD-10-CM

## 2024-04-27 DIAGNOSIS — Z9189 Other specified personal risk factors, not elsewhere classified: Secondary | ICD-10-CM

## 2024-04-27 DIAGNOSIS — E786 Lipoprotein deficiency: Secondary | ICD-10-CM

## 2024-04-27 DIAGNOSIS — M549 Dorsalgia, unspecified: Secondary | ICD-10-CM

## 2024-04-27 DIAGNOSIS — I739 Peripheral vascular disease, unspecified: Secondary | ICD-10-CM

## 2024-04-27 DIAGNOSIS — K859 Acute pancreatitis without necrosis or infection, unspecified: Secondary | ICD-10-CM

## 2024-05-04 ENCOUNTER — Ambulatory Visit: Admit: 2024-05-04 | Discharge: 2024-05-04 | Payer: MEDICARE | Primary: Geriatric Medicine

## 2024-05-04 ENCOUNTER — Ambulatory Visit: Payer: MEDICARE

## 2024-05-04 DIAGNOSIS — E063 Autoimmune thyroiditis: Secondary | ICD-10-CM

## 2024-05-04 DIAGNOSIS — M1990 Unspecified osteoarthritis, unspecified site: Secondary | ICD-10-CM

## 2024-05-04 DIAGNOSIS — Z889 Allergy status to unspecified drugs, medicaments and biological substances status: Secondary | ICD-10-CM

## 2024-05-04 DIAGNOSIS — K859 Acute pancreatitis without necrosis or infection, unspecified: Secondary | ICD-10-CM

## 2024-05-04 DIAGNOSIS — K222 Esophageal obstruction: Secondary | ICD-10-CM

## 2024-05-04 DIAGNOSIS — Z5189 Encounter for other specified aftercare: Secondary | ICD-10-CM

## 2024-05-04 DIAGNOSIS — K219 Gastro-esophageal reflux disease without esophagitis: Secondary | ICD-10-CM

## 2024-05-04 DIAGNOSIS — Z9189 Other specified personal risk factors, not elsewhere classified: Secondary | ICD-10-CM

## 2024-05-04 DIAGNOSIS — M549 Dorsalgia, unspecified: Secondary | ICD-10-CM

## 2024-05-04 DIAGNOSIS — R112 Nausea with vomiting, unspecified: Secondary | ICD-10-CM

## 2024-05-04 DIAGNOSIS — I739 Peripheral vascular disease, unspecified: Secondary | ICD-10-CM

## 2024-05-04 DIAGNOSIS — Z931 Gastrostomy status: Principal | ICD-10-CM

## 2024-05-04 DIAGNOSIS — K8689 Other specified diseases of pancreas: Secondary | ICD-10-CM

## 2024-05-04 DIAGNOSIS — F909 Attention-deficit hyperactivity disorder, unspecified type: Secondary | ICD-10-CM

## 2024-05-04 DIAGNOSIS — E559 Vitamin D deficiency, unspecified: Secondary | ICD-10-CM

## 2024-05-04 DIAGNOSIS — F509 Eating disorder, unspecified: Secondary | ICD-10-CM

## 2024-05-04 DIAGNOSIS — E119 Type 2 diabetes mellitus without complications: Principal | ICD-10-CM

## 2024-05-04 DIAGNOSIS — E786 Lipoprotein deficiency: Secondary | ICD-10-CM

## 2024-05-04 MED ORDER — NALOXONE HCL 0.4 MG/ML IJ SOLN
.2 mg | INTRAVENOUS | PRN
Start: 2024-05-04 — End: ?

## 2024-05-04 MED ORDER — PROCHLORPERAZINE EDISYLATE 10 MG/2ML IJ SOLN
10 mg | INTRAVENOUS | Status: DC | PRN
Start: 2024-05-04 — End: 2024-05-04

## 2024-05-04 MED ORDER — PROPOFOL INFUSION JX
INTRAVENOUS | Status: DC | PRN
Start: 2024-05-04 — End: 2024-05-04

## 2024-05-04 MED ORDER — TORSEMIDE 20 MG PO TABS
20 mg | Freq: Every day | ORAL
Start: 2024-05-04 — End: ?

## 2024-05-04 MED ORDER — NALBUPHINE HCL 10 MG/ML IJ SOLN
2.5 mg | INTRAVENOUS | Status: DC | PRN
Start: 2024-05-04 — End: 2024-05-04

## 2024-05-04 MED ORDER — SUCCINYLCHOLINE CHLORIDE 20 MG/ML IV SOLN SH
INTRAVENOUS | Status: DC | PRN
Start: 2024-05-04 — End: 2024-05-04

## 2024-05-04 MED ORDER — HALOPERIDOL LACTATE 5 MG/ML IJ SOLN
.5 mg | INTRAVENOUS | Status: DC | PRN
Start: 2024-05-04 — End: 2024-05-04

## 2024-05-04 MED ORDER — SCOPOLAMINE 1 MG/3DAYS TD PT72
MEDICATED_PATCH | TRANSDERMAL | Status: DC | PRN
Start: 2024-05-04 — End: 2024-05-04

## 2024-05-04 MED ORDER — PROPOFOL 10 MG/ML IV EMUL CUSTOM SH
INTRAVENOUS | Status: DC | PRN
Start: 2024-05-04 — End: 2024-05-04

## 2024-05-04 MED ORDER — LACTATED RINGERS IV SOLN
INTRAVENOUS | Status: DC | PRN
Start: 2024-05-04 — End: 2024-05-04

## 2024-05-04 MED ORDER — NALOXONE HCL 0.4 MG/ML IJ SOLN
.2 mg | INTRAVENOUS | Status: CP | PRN
Start: 2024-05-04 — End: ?

## 2024-05-04 MED ORDER — IOHEXOL 240 MG/ML IJ SOLN
Status: DC | PRN
Start: 2024-05-04 — End: 2024-05-04

## 2024-05-04 MED ORDER — INSULIN ASPART 100 UNIT/ML IJ SOLN
0-7 [IU] | Freq: Once | SUBCUTANEOUS | Status: DC
Start: 2024-05-04 — End: 2024-05-04

## 2024-05-04 MED ORDER — XTAMPZA ER 9 MG PO C12A
9 mg | Freq: Two times a day (BID) | ORAL
Start: 2024-05-04 — End: ?

## 2024-05-04 MED ORDER — LACTATED RINGERS IV SOLN
1000 mL | Freq: Once | INTRAVENOUS | Status: DC
Start: 2024-05-04 — End: 2024-05-04

## 2024-05-04 MED ORDER — FAMOTIDINE (PF) 20 MG/2ML IV SOLN
INTRAVENOUS | Status: DC | PRN
Start: 2024-05-04 — End: 2024-05-04

## 2024-05-04 MED ORDER — LIDOCAINE HCL (PF) 1 % IJ SOLN SH
INTRAVENOUS | Status: DC | PRN
Start: 2024-05-04 — End: 2024-05-04

## 2024-05-04 MED ORDER — ONDANSETRON HCL 4 MG/2 ML IJ SOLN SH
INTRAVENOUS | Status: DC | PRN
Start: 2024-05-04 — End: 2024-05-04

## 2024-05-28 ENCOUNTER — Emergency Department: Admit: 2024-05-28 | Discharge: 2024-05-28 | Payer: MEDICARE

## 2024-05-28 ENCOUNTER — Inpatient Hospital Stay: Payer: MEDICARE

## 2024-05-28 DIAGNOSIS — E119 Type 2 diabetes mellitus without complications: Principal | ICD-10-CM

## 2024-05-28 DIAGNOSIS — Z9884 Bariatric surgery status: Secondary | ICD-10-CM

## 2024-05-28 DIAGNOSIS — R112 Nausea with vomiting, unspecified: Principal | ICD-10-CM

## 2024-05-28 DIAGNOSIS — E786 Lipoprotein deficiency: Secondary | ICD-10-CM

## 2024-05-28 DIAGNOSIS — M1990 Unspecified osteoarthritis, unspecified site: Secondary | ICD-10-CM

## 2024-05-28 DIAGNOSIS — M549 Dorsalgia, unspecified: Secondary | ICD-10-CM

## 2024-05-28 DIAGNOSIS — I739 Peripheral vascular disease, unspecified: Secondary | ICD-10-CM

## 2024-05-28 DIAGNOSIS — Z5189 Encounter for other specified aftercare: Secondary | ICD-10-CM

## 2024-05-28 DIAGNOSIS — K222 Esophageal obstruction: Secondary | ICD-10-CM

## 2024-05-28 DIAGNOSIS — K219 Gastro-esophageal reflux disease without esophagitis: Secondary | ICD-10-CM

## 2024-05-28 DIAGNOSIS — K8689 Other specified diseases of pancreas: Secondary | ICD-10-CM

## 2024-05-28 DIAGNOSIS — K9420 Gastrostomy complication, unspecified: Secondary | ICD-10-CM

## 2024-05-28 DIAGNOSIS — F509 Eating disorder, unspecified: Secondary | ICD-10-CM

## 2024-05-28 DIAGNOSIS — E785 Hyperlipidemia, unspecified: Secondary | ICD-10-CM

## 2024-05-28 DIAGNOSIS — R109 Unspecified abdominal pain: Secondary | ICD-10-CM

## 2024-05-28 DIAGNOSIS — E559 Vitamin D deficiency, unspecified: Secondary | ICD-10-CM

## 2024-05-28 DIAGNOSIS — E063 Autoimmune thyroiditis: Secondary | ICD-10-CM

## 2024-05-28 DIAGNOSIS — K859 Acute pancreatitis without necrosis or infection, unspecified: Secondary | ICD-10-CM

## 2024-05-28 DIAGNOSIS — F909 Attention-deficit hyperactivity disorder, unspecified type: Secondary | ICD-10-CM

## 2024-05-28 DIAGNOSIS — Z9189 Other specified personal risk factors, not elsewhere classified: Secondary | ICD-10-CM

## 2024-05-28 DIAGNOSIS — Z889 Allergy status to unspecified drugs, medicaments and biological substances status: Secondary | ICD-10-CM

## 2024-05-28 DIAGNOSIS — Z931 Gastrostomy status: Secondary | ICD-10-CM

## 2024-05-28 DIAGNOSIS — Z90410 Acquired total absence of pancreas: Secondary | ICD-10-CM

## 2024-05-28 MED ORDER — DIATRIZOATE MEGLUMINE & SODIUM 66-10 % PO SOLN
30 mL | Freq: Once | ORAL | Status: CP
Start: 2024-05-28 — End: ?

## 2024-05-28 MED ORDER — ONDANSETRON HCL 4 MG/2 ML IJ SOLN SH
INTRAVENOUS | Status: DC | PRN
Start: 2024-05-28 — End: 2024-05-28

## 2024-05-28 MED ORDER — NICOTINE PATCH REMOVAL
1 | MEDICATED_PATCH | Freq: Every day | TRANSDERMAL | Status: DC
Start: 2024-05-28 — End: 2024-05-28

## 2024-05-28 MED ORDER — METOCLOPRAMIDE HCL 5 MG/ML IJ SOLN
10 mg | Freq: Once | INTRAVENOUS | Status: CP
Start: 2024-05-28 — End: ?

## 2024-05-28 MED ORDER — LACTATED RINGERS IV SOLN
INTRAVENOUS | Status: DC
Start: 2024-05-28 — End: 2024-05-28

## 2024-05-28 MED ORDER — PROCHLORPERAZINE EDISYLATE 10 MG/2ML IJ SOLN
10 mg | INTRAVENOUS | PRN
Start: 2024-05-28 — End: ?

## 2024-05-28 MED ORDER — LIDOCAINE HCL (PF) 1 % IJ SOLN SH
INTRAVENOUS | Status: DC | PRN
Start: 2024-05-28 — End: 2024-05-28

## 2024-05-28 MED ORDER — BOLUS IV FLUID JX
Freq: Once | INTRAVENOUS | Status: CP
Start: 2024-05-28 — End: ?

## 2024-05-28 MED ORDER — PROMETHAZINE HCL 12.5 MG PO TABS
12.5 mg | Freq: Four times a day (QID) | ORAL | Status: DC | PRN
Start: 2024-05-28 — End: 2024-05-28

## 2024-05-28 MED ORDER — PROPOFOL 10 MG/ML IV EMUL CUSTOM SH
INTRAVENOUS | Status: DC | PRN
Start: 2024-05-28 — End: 2024-05-28

## 2024-05-28 MED ORDER — HYDROMORPHONE HCL 1 MG/ML IJ SOLN
.5 mg | INTRAVENOUS | Status: DC | PRN
Start: 2024-05-28 — End: 2024-05-28

## 2024-05-28 MED ORDER — NALOXONE HCL 0.4 MG/ML IJ SOLN
.4 mg | INTRAVENOUS | PRN
Start: 2024-05-28 — End: ?

## 2024-05-28 MED ORDER — NICOTINE 21 MG/24HR TD PT24
1 | MEDICATED_PATCH | Freq: Every day | TRANSDERMAL | Status: DC
Start: 2024-05-28 — End: 2024-05-28

## 2024-05-28 MED ORDER — ROCURONIUM BROMIDE 50 MG/5ML IV SOSY
INTRAVENOUS | Status: DC | PRN
Start: 2024-05-28 — End: 2024-05-28

## 2024-05-28 MED ORDER — NALBUPHINE HCL 10 MG/ML IJ SOLN
2.5 mg | INTRAVENOUS | PRN
Start: 2024-05-28 — End: ?

## 2024-05-28 MED ORDER — DEXAMETHASONE SODIUM PHOSPHATE 4 MG/ML CUSTOM COMPONENT JX
INTRAVENOUS | Status: DC | PRN
Start: 2024-05-28 — End: 2024-05-28

## 2024-05-28 MED ORDER — SUGAMMADEX SODIUM 200 MG/2ML IV SOLN
INTRAVENOUS | Status: DC | PRN
Start: 2024-05-28 — End: 2024-05-28

## 2024-05-28 MED ORDER — LACTATED RINGERS IV SOLN
INTRAVENOUS | Status: DC | PRN
Start: 2024-05-28 — End: 2024-05-28

## 2024-05-28 MED ORDER — HALOPERIDOL LACTATE 5 MG/ML IJ SOLN
.5 mg | INTRAVENOUS | PRN
Start: 2024-05-28 — End: ?

## 2024-05-28 MED ORDER — SUCCINYLCHOLINE CHLORIDE 20 MG/ML IV SOLN SH
INTRAVENOUS | Status: DC | PRN
Start: 2024-05-28 — End: 2024-05-28

## 2024-05-28 MED ORDER — OXYCODONE ER 9 MG PO C12A
9 mg | Freq: Once | ORAL | 0 refills | Status: CP
Start: 2024-05-28 — End: ?

## 2024-05-28 MED ORDER — HYDROMORPHONE HCL 1 MG/ML IJ SOLN
1 mg | Freq: Once | INTRAVENOUS | Status: CP
Start: 2024-05-28 — End: ?

## 2024-05-28 MED ORDER — FENTANYL CITRATE INJ 50 MCG/ML CUSTOM AMP/VIAL
25 ug | Freq: Once | INTRAVENOUS | 0 refills | Status: CP
Start: 2024-05-28 — End: ?

## 2024-05-29 DIAGNOSIS — E559 Vitamin D deficiency, unspecified: Secondary | ICD-10-CM

## 2024-05-29 DIAGNOSIS — M549 Dorsalgia, unspecified: Secondary | ICD-10-CM

## 2024-05-29 DIAGNOSIS — R112 Nausea with vomiting, unspecified: Secondary | ICD-10-CM

## 2024-05-29 DIAGNOSIS — E119 Type 2 diabetes mellitus without complications: Principal | ICD-10-CM

## 2024-05-29 DIAGNOSIS — I739 Peripheral vascular disease, unspecified: Secondary | ICD-10-CM

## 2024-05-29 DIAGNOSIS — K8689 Other specified diseases of pancreas: Secondary | ICD-10-CM

## 2024-05-29 DIAGNOSIS — K222 Esophageal obstruction: Secondary | ICD-10-CM

## 2024-05-29 DIAGNOSIS — Z9189 Other specified personal risk factors, not elsewhere classified: Secondary | ICD-10-CM

## 2024-05-29 DIAGNOSIS — M1990 Unspecified osteoarthritis, unspecified site: Secondary | ICD-10-CM

## 2024-05-29 DIAGNOSIS — E063 Autoimmune thyroiditis: Secondary | ICD-10-CM

## 2024-05-29 DIAGNOSIS — K859 Acute pancreatitis without necrosis or infection, unspecified: Secondary | ICD-10-CM

## 2024-05-29 DIAGNOSIS — Z889 Allergy status to unspecified drugs, medicaments and biological substances status: Secondary | ICD-10-CM

## 2024-05-29 DIAGNOSIS — F509 Eating disorder, unspecified: Secondary | ICD-10-CM

## 2024-05-29 DIAGNOSIS — Z5189 Encounter for other specified aftercare: Secondary | ICD-10-CM

## 2024-05-29 DIAGNOSIS — K219 Gastro-esophageal reflux disease without esophagitis: Secondary | ICD-10-CM

## 2024-05-29 DIAGNOSIS — E786 Lipoprotein deficiency: Secondary | ICD-10-CM

## 2024-05-29 DIAGNOSIS — F909 Attention-deficit hyperactivity disorder, unspecified type: Secondary | ICD-10-CM

## 2024-05-31 ENCOUNTER — Inpatient Hospital Stay: Payer: MEDICARE

## 2024-05-31 ENCOUNTER — Observation Stay: Admit: 2024-05-31 | Payer: MEDICARE | Primary: Geriatric Medicine

## 2024-05-31 ENCOUNTER — Observation Stay: Admit: 2024-05-31 | Discharge: 2024-06-01 | Payer: MEDICARE | Primary: Geriatric Medicine

## 2024-05-31 MED ORDER — BISACODYL 5 MG PO TBEC
10 mg | Freq: Every day | ORAL | Status: CP | PRN
Start: 2024-05-31 — End: ?

## 2024-05-31 MED ORDER — GLUCOSE 4 G PO CHEW JX
16 g | ORAL | Status: CP | PRN
Start: 2024-05-31 — End: ?

## 2024-05-31 MED ORDER — DEXTROSE-SODIUM CHLORIDE 5-0.9 % IV SOLN
INTRAVENOUS | Status: CP
Start: 2024-05-31 — End: ?

## 2024-05-31 MED ORDER — PROCHLORPERAZINE EDISYLATE 10 MG/2ML IJ SOLN
10 mg | Freq: Four times a day (QID) | INTRAVENOUS | Status: CP | PRN
Start: 2024-05-31 — End: ?

## 2024-05-31 MED ORDER — DEXTROSE 50 % IV SOLN
50 mL | INTRAVENOUS | Status: CP | PRN
Start: 2024-05-31 — End: ?

## 2024-05-31 MED ORDER — MORPHINE SULFATE 2 MG/ML IV SOLN CUSTOM JX
2 mg | INTRAVENOUS | Status: CP | PRN
Start: 2024-05-31 — End: ?

## 2024-05-31 MED ORDER — POLYETHYLENE GLYCOL 3350 17 G PO PACK
17 g | Freq: Every day | ORAL | Status: CP | PRN
Start: 2024-05-31 — End: ?

## 2024-05-31 MED ORDER — ONDANSETRON HCL 4 MG/2 ML IJ SOLN SH
4 mg | Freq: Four times a day (QID) | INTRAVENOUS | Status: CP | PRN
Start: 2024-05-31 — End: ?

## 2024-05-31 MED ORDER — ORAL CARBOHYDRATE FOR HYPOGLYCEMIA JX
4-8 [oz_av] | ORAL | Status: CP | PRN
Start: 2024-05-31 — End: ?

## 2024-05-31 MED ORDER — ONDANSETRON 4 MG PO TBDP
4 mg | Freq: Four times a day (QID) | ORAL | Status: CP | PRN
Start: 2024-05-31 — End: ?

## 2024-06-01 DIAGNOSIS — F509 Eating disorder, unspecified: Secondary | ICD-10-CM

## 2024-06-01 DIAGNOSIS — R112 Nausea with vomiting, unspecified: Secondary | ICD-10-CM

## 2024-06-01 DIAGNOSIS — E559 Vitamin D deficiency, unspecified: Secondary | ICD-10-CM

## 2024-06-01 DIAGNOSIS — Z5189 Encounter for other specified aftercare: Secondary | ICD-10-CM

## 2024-06-01 DIAGNOSIS — K8689 Other specified diseases of pancreas: Secondary | ICD-10-CM

## 2024-06-01 DIAGNOSIS — M549 Dorsalgia, unspecified: Secondary | ICD-10-CM

## 2024-06-01 DIAGNOSIS — K222 Esophageal obstruction: Secondary | ICD-10-CM

## 2024-06-01 DIAGNOSIS — K219 Gastro-esophageal reflux disease without esophagitis: Secondary | ICD-10-CM

## 2024-06-01 DIAGNOSIS — E063 Autoimmune thyroiditis: Secondary | ICD-10-CM

## 2024-06-01 DIAGNOSIS — Z9189 Other specified personal risk factors, not elsewhere classified: Secondary | ICD-10-CM

## 2024-06-01 DIAGNOSIS — K859 Acute pancreatitis without necrosis or infection, unspecified: Secondary | ICD-10-CM

## 2024-06-01 DIAGNOSIS — E119 Type 2 diabetes mellitus without complications: Principal | ICD-10-CM

## 2024-06-01 DIAGNOSIS — Z889 Allergy status to unspecified drugs, medicaments and biological substances status: Secondary | ICD-10-CM

## 2024-06-01 DIAGNOSIS — M1990 Unspecified osteoarthritis, unspecified site: Secondary | ICD-10-CM

## 2024-06-01 DIAGNOSIS — E786 Lipoprotein deficiency: Secondary | ICD-10-CM

## 2024-06-01 DIAGNOSIS — I739 Peripheral vascular disease, unspecified: Secondary | ICD-10-CM

## 2024-06-01 DIAGNOSIS — F909 Attention-deficit hyperactivity disorder, unspecified type: Secondary | ICD-10-CM

## 2024-06-01 MED ORDER — FENTANYL CITRATE INJ 50 MCG/ML CUSTOM AMP/VIAL
25-50 ug | INTRAVENOUS | 0 refills | Status: CP | PRN
Start: 2024-06-01 — End: ?

## 2024-06-01 MED ORDER — LIDOCAINE HCL (PF) 2 % IJ SOLN SH
Freq: Once | SUBCUTANEOUS | Status: DC
Start: 2024-06-01 — End: 2024-06-01

## 2024-06-01 MED ORDER — NALOXONE HCL 2 MG/2ML IJ SOSY
.2 mg | INTRAVENOUS | Status: CP | PRN
Start: 2024-06-01 — End: ?

## 2024-06-01 MED ORDER — MIDAZOLAM HCL 2 MG/2ML IJ SOLN
.5-1 mg | INTRAVENOUS | Status: CP | PRN
Start: 2024-06-01 — End: ?

## 2024-06-01 MED ORDER — IOHEXOL 300 MG/ML IJ SOLN
100 mL | Freq: Once | INTRAVENOUS | Status: CP
Start: 2024-06-01 — End: ?

## 2024-06-01 MED ORDER — SODIUM CHLORIDE 0.9% FOR FLUSHES
20-180 mL | Status: CP | PRN
Start: 2024-06-01 — End: ?
